# Patient Record
Sex: Female | Born: 1955 | Race: Black or African American | Hispanic: No | Smoking: Current every day smoker
Health system: Southern US, Community
[De-identification: ages and names within clinical notes are randomized; demographics above are authoritative.]

## PROBLEM LIST (undated history)

## (undated) DIAGNOSIS — F419 Anxiety disorder, unspecified: Secondary | ICD-10-CM

## (undated) DIAGNOSIS — Z87442 Personal history of urinary calculi: Secondary | ICD-10-CM

## (undated) DIAGNOSIS — Z9289 Personal history of other medical treatment: Secondary | ICD-10-CM

## (undated) DIAGNOSIS — M87059 Idiopathic aseptic necrosis of unspecified femur: Secondary | ICD-10-CM

## (undated) DIAGNOSIS — F32A Depression, unspecified: Secondary | ICD-10-CM

## (undated) DIAGNOSIS — G479 Sleep disorder, unspecified: Secondary | ICD-10-CM

## (undated) DIAGNOSIS — D649 Anemia, unspecified: Secondary | ICD-10-CM

## (undated) DIAGNOSIS — K219 Gastro-esophageal reflux disease without esophagitis: Secondary | ICD-10-CM

## (undated) DIAGNOSIS — B20 Human immunodeficiency virus [HIV] disease: Secondary | ICD-10-CM

## (undated) DIAGNOSIS — J189 Pneumonia, unspecified organism: Secondary | ICD-10-CM

## (undated) DIAGNOSIS — N200 Calculus of kidney: Secondary | ICD-10-CM

## (undated) DIAGNOSIS — C801 Malignant (primary) neoplasm, unspecified: Secondary | ICD-10-CM

## (undated) DIAGNOSIS — F329 Major depressive disorder, single episode, unspecified: Secondary | ICD-10-CM

## (undated) DIAGNOSIS — M1990 Unspecified osteoarthritis, unspecified site: Secondary | ICD-10-CM

## (undated) HISTORY — PX: APPENDECTOMY: SHX54

## (undated) HISTORY — PX: PORT-A-CATH REMOVAL: SHX5289

## (undated) HISTORY — PX: KNEE ARTHROSCOPY: SHX127

---

## 2006-12-24 HISTORY — PX: COLON SURGERY: SHX602

## 2011-12-25 HISTORY — PX: COLOSTOMY REVERSAL: SHX5782

## 2012-12-11 ENCOUNTER — Emergency Department (HOSPITAL_BASED_OUTPATIENT_CLINIC_OR_DEPARTMENT_OTHER)
Admission: EM | Admit: 2012-12-11 | Discharge: 2012-12-11 | Disposition: A | Discharge status: Home / Self Care | Payer: Medicare Other | Attending: Emergency Medicine | Admitting: Emergency Medicine

## 2012-12-11 ENCOUNTER — Encounter (HOSPITAL_BASED_OUTPATIENT_CLINIC_OR_DEPARTMENT_OTHER): Payer: Self-pay | Admitting: *Deleted

## 2012-12-11 DIAGNOSIS — Z79899 Other long term (current) drug therapy: Secondary | ICD-10-CM | POA: Insufficient documentation

## 2012-12-11 DIAGNOSIS — Z21 Asymptomatic human immunodeficiency virus [HIV] infection status: Secondary | ICD-10-CM | POA: Insufficient documentation

## 2012-12-11 DIAGNOSIS — M25569 Pain in unspecified knee: Secondary | ICD-10-CM | POA: Insufficient documentation

## 2012-12-11 DIAGNOSIS — Z85038 Personal history of other malignant neoplasm of large intestine: Secondary | ICD-10-CM | POA: Insufficient documentation

## 2012-12-11 DIAGNOSIS — G8929 Other chronic pain: Secondary | ICD-10-CM | POA: Insufficient documentation

## 2012-12-11 DIAGNOSIS — Z7982 Long term (current) use of aspirin: Secondary | ICD-10-CM | POA: Insufficient documentation

## 2012-12-11 HISTORY — DX: Human immunodeficiency virus (HIV) disease: B20

## 2012-12-11 HISTORY — DX: Malignant (primary) neoplasm, unspecified: C80.1

## 2012-12-11 MED ORDER — HYDROCODONE-ACETAMINOPHEN 5-325 MG PO TABS: 2.0000 | ORAL_TABLET | ORAL | Status: DC | PRN

## 2012-12-11 NOTE — ED Provider Notes (Signed)
Medical screening examination/treatment/procedure(s) were performed by non-physician practitioner and as supervising physician I was immediately available for consultation/collaboration.  Yussef Jorge, MD 12/11/12 1546 

## 2012-12-11 NOTE — ED Provider Notes (Signed)
History     CSN: [id number]  Arrival date & time 12/11/12  1223   First MD Initiated Contact with Patient 12/11/12 1302      Chief Complaint  Patient presents with  . Knee Pain    (Consider location/radiation/quality/duration/timing/severity/associated sxs/prior treatment) HPI Comments: Pt states that she has been seen by the chiropractor and her pcp and she is referred to ortho but she needs something for pain:pt states that he has already had xray and she was told that it was like a meniscal tear  Patient is a 56 y.o. female presenting with knee pain. The history is provided by the patient. No language interpreter was used.  Knee Pain This is a chronic problem. The current episode started more than 1 month ago. The problem occurs constantly. The problem has been unchanged. Pertinent negatives include no fever. The symptoms are aggravated by bending and standing.    Past Medical History  Diagnosis Date  . HIV disease   . Cancer     Past Surgical History  Procedure Date  . Colon surgery   . Appendectomy     No family history on file.  History  Substance Use Topics  . Smoking status: Not on file  . Smokeless tobacco: Not on file  . Alcohol Use:     OB History    Grav Para Term Preterm Abortions TAB SAB Ect Mult Living                  Review of Systems  Constitutional: Negative.  Negative for fever.  Respiratory: Negative.   Cardiovascular: Negative.     Allergies  Codeine; Ivp dye; and Sulfa antibiotics  Home Medications   Current Outpatient Rx  Name  Route  Sig  Dispense  Refill  . ASPIRIN 81 MG PO TABS   Oral   Take 81 mg by mouth daily.         . ISENTRESS PO   Oral   Take by mouth.         . NORVIR PO   Oral   Take by mouth.         . APTIVUS PO   Oral   Take by mouth.         Marland Kitchen HYDROCODONE-ACETAMINOPHEN 5-325 MG PO TABS   Oral   Take 2 tablets by mouth every 4 (four) hours as needed for pain.   15 tablet   0     BP  136/87  Pulse 88  Temp 98.5 F (36.9 C) (Oral)  Resp 20  SpO2 100%  Physical Exam  Nursing note and vitals reviewed. Constitutional: She is oriented to person, place, and time. She appears well-developed and well-nourished.  HENT:  Head: Normocephalic and atraumatic.  Cardiovascular: Normal rate and regular rhythm.   Pulmonary/Chest: Effort normal and breath sounds normal.  Musculoskeletal: Normal range of motion.       Mild swelling noted to the left knee Pt has full rom:no redness or swelling noted to the area  Neurological: She is alert and oriented to person, place, and time.  Skin: Skin is warm and dry.    ED Course  Procedures (including critical care time)  Labs Reviewed - No data to display No results found.   1. Knee pain       MDM  Pt has appt with ortho Jan 24:treating symptomatically with something for pain        Teressa Lower, NP 12/11/12 1341
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
## 2012-12-30 ENCOUNTER — Other Ambulatory Visit: Payer: Self-pay | Admitting: Family Medicine

## 2012-12-30 DIAGNOSIS — R52 Pain, unspecified: Secondary | ICD-10-CM

## 2013-01-05 ENCOUNTER — Other Ambulatory Visit: Payer: Self-pay | Admitting: Family Medicine

## 2013-01-05 DIAGNOSIS — M25569 Pain in unspecified knee: Secondary | ICD-10-CM

## 2013-01-06 ENCOUNTER — Other Ambulatory Visit: Payer: Self-pay

## 2013-01-06 ENCOUNTER — Other Ambulatory Visit: Payer: Medicare Other

## 2013-01-09 ENCOUNTER — Other Ambulatory Visit (HOSPITAL_BASED_OUTPATIENT_CLINIC_OR_DEPARTMENT_OTHER): Payer: Self-pay | Admitting: Family Medicine

## 2013-01-09 DIAGNOSIS — M25562 Pain in left knee: Secondary | ICD-10-CM

## 2013-01-11 ENCOUNTER — Encounter (HOSPITAL_BASED_OUTPATIENT_CLINIC_OR_DEPARTMENT_OTHER): Payer: Self-pay

## 2013-01-11 ENCOUNTER — Emergency Department (HOSPITAL_BASED_OUTPATIENT_CLINIC_OR_DEPARTMENT_OTHER)
Admission: EM | Admit: 2013-01-11 | Discharge: 2013-01-11 | Disposition: A | Discharge status: Home / Self Care | Payer: Medicare Other | Attending: Emergency Medicine | Admitting: Emergency Medicine

## 2013-01-11 DIAGNOSIS — M25569 Pain in unspecified knee: Secondary | ICD-10-CM | POA: Insufficient documentation

## 2013-01-11 DIAGNOSIS — F17200 Nicotine dependence, unspecified, uncomplicated: Secondary | ICD-10-CM | POA: Insufficient documentation

## 2013-01-11 DIAGNOSIS — Z79899 Other long term (current) drug therapy: Secondary | ICD-10-CM | POA: Insufficient documentation

## 2013-01-11 DIAGNOSIS — Z21 Asymptomatic human immunodeficiency virus [HIV] infection status: Secondary | ICD-10-CM | POA: Insufficient documentation

## 2013-01-11 DIAGNOSIS — Z7982 Long term (current) use of aspirin: Secondary | ICD-10-CM | POA: Insufficient documentation

## 2013-01-11 DIAGNOSIS — Z8589 Personal history of malignant neoplasm of other organs and systems: Secondary | ICD-10-CM | POA: Insufficient documentation

## 2013-01-11 DIAGNOSIS — G8929 Other chronic pain: Secondary | ICD-10-CM

## 2013-01-11 MED ORDER — HYDROCODONE-ACETAMINOPHEN 5-325 MG PO TABS: 2.0000 | ORAL_TABLET | Freq: Four times a day (QID) | ORAL | Status: DC | PRN

## 2013-01-11 NOTE — ED Notes (Signed)
MD with pt  
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
## 2013-01-11 NOTE — ED Notes (Signed)
I applied extra-large knee sleeve which improved patient comfort per Dr. approval.

## 2013-01-13 ENCOUNTER — Ambulatory Visit (HOSPITAL_BASED_OUTPATIENT_CLINIC_OR_DEPARTMENT_OTHER): Payer: Medicare Other

## 2013-01-13 ENCOUNTER — Ambulatory Visit (HOSPITAL_BASED_OUTPATIENT_CLINIC_OR_DEPARTMENT_OTHER)
Admission: RE | Admit: 2013-01-13 | Discharge: 2013-01-13 | Disposition: A | Discharge status: Home / Self Care | Payer: Medicare Other | Source: Ambulatory Visit | Attending: Family Medicine | Admitting: Family Medicine

## 2013-01-13 DIAGNOSIS — M25469 Effusion, unspecified knee: Secondary | ICD-10-CM | POA: Insufficient documentation

## 2013-01-13 DIAGNOSIS — M25562 Pain in left knee: Secondary | ICD-10-CM

## 2013-01-13 DIAGNOSIS — M2240 Chondromalacia patellae, unspecified knee: Secondary | ICD-10-CM | POA: Insufficient documentation

## 2013-06-12 ENCOUNTER — Ambulatory Visit: Payer: Medicare Other | Admitting: Physical Therapy

## 2013-06-15 ENCOUNTER — Emergency Department (HOSPITAL_BASED_OUTPATIENT_CLINIC_OR_DEPARTMENT_OTHER)
Admission: EM | Admit: 2013-06-15 | Discharge: 2013-06-15 | Disposition: A | Discharge status: Home / Self Care | Payer: Medicare Other | Attending: Emergency Medicine | Admitting: Emergency Medicine

## 2013-06-15 ENCOUNTER — Ambulatory Visit: Payer: Medicare Other | Attending: Orthopaedic Surgery | Admitting: Physical Therapy

## 2013-06-15 ENCOUNTER — Encounter (HOSPITAL_BASED_OUTPATIENT_CLINIC_OR_DEPARTMENT_OTHER): Payer: Self-pay

## 2013-06-15 DIAGNOSIS — Z87828 Personal history of other (healed) physical injury and trauma: Secondary | ICD-10-CM | POA: Insufficient documentation

## 2013-06-15 DIAGNOSIS — M6281 Muscle weakness (generalized): Secondary | ICD-10-CM | POA: Insufficient documentation

## 2013-06-15 DIAGNOSIS — Z7982 Long term (current) use of aspirin: Secondary | ICD-10-CM | POA: Insufficient documentation

## 2013-06-15 DIAGNOSIS — M25669 Stiffness of unspecified knee, not elsewhere classified: Secondary | ICD-10-CM | POA: Insufficient documentation

## 2013-06-15 DIAGNOSIS — Z79899 Other long term (current) drug therapy: Secondary | ICD-10-CM | POA: Insufficient documentation

## 2013-06-15 DIAGNOSIS — IMO0001 Reserved for inherently not codable concepts without codable children: Secondary | ICD-10-CM | POA: Insufficient documentation

## 2013-06-15 DIAGNOSIS — Z859 Personal history of malignant neoplasm, unspecified: Secondary | ICD-10-CM | POA: Insufficient documentation

## 2013-06-15 DIAGNOSIS — Z21 Asymptomatic human immunodeficiency virus [HIV] infection status: Secondary | ICD-10-CM | POA: Insufficient documentation

## 2013-06-15 DIAGNOSIS — M25569 Pain in unspecified knee: Secondary | ICD-10-CM | POA: Insufficient documentation

## 2013-06-15 DIAGNOSIS — Z9889 Other specified postprocedural states: Secondary | ICD-10-CM | POA: Insufficient documentation

## 2013-06-15 DIAGNOSIS — M25562 Pain in left knee: Secondary | ICD-10-CM

## 2013-06-15 DIAGNOSIS — R269 Unspecified abnormalities of gait and mobility: Secondary | ICD-10-CM | POA: Insufficient documentation

## 2013-06-15 DIAGNOSIS — F17200 Nicotine dependence, unspecified, uncomplicated: Secondary | ICD-10-CM | POA: Insufficient documentation

## 2013-06-15 MED ORDER — PROMETHAZINE HCL 25 MG PO TABS: 25.0000 mg | ORAL_TABLET | Freq: Four times a day (QID) | ORAL | Status: DC | PRN

## 2013-06-15 MED ORDER — HYDROCODONE-ACETAMINOPHEN 5-325 MG PO TABS: 2.0000 | ORAL_TABLET | Freq: Four times a day (QID) | ORAL | Status: DC | PRN

## 2013-06-15 MED ORDER — HYDROCODONE-ACETAMINOPHEN 5-325 MG PO TABS
2.0000 | ORAL_TABLET | Freq: Once | ORAL | Status: AC
  Administered 2013-06-15: 2 via ORAL
  Filled 2013-06-15: qty 2

## 2013-06-15 MED ORDER — DIPHENHYDRAMINE HCL 25 MG PO TABS: 50.0000 mg | ORAL_TABLET | ORAL | Status: DC | PRN

## 2013-06-15 MED ORDER — DIPHENHYDRAMINE HCL 25 MG PO CAPS
25.0000 mg | ORAL_CAPSULE | Freq: Once | ORAL | Status: AC
  Administered 2013-06-15: 25 mg via ORAL
  Filled 2013-06-15: qty 1

## 2013-06-15 MED ORDER — ONDANSETRON 8 MG PO TBDP
8.0000 mg | ORAL_TABLET | Freq: Once | ORAL | Status: AC
  Administered 2013-06-15: 8 mg via ORAL
  Filled 2013-06-15: qty 1
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
## 2013-06-16 NOTE — ED Provider Notes (Signed)
I have reviewed the report and personally reviewed the above radiology studies.    Addasyn Mcbreen S Grill, MD 06/16/13 0845 

## 2013-06-18 ENCOUNTER — Ambulatory Visit: Payer: Medicare Other | Admitting: Physical Therapy

## 2013-06-22 ENCOUNTER — Ambulatory Visit: Payer: Medicare Other | Admitting: Rehabilitation

## 2013-06-23 ENCOUNTER — Ambulatory Visit: Payer: Medicare Other | Attending: Orthopaedic Surgery | Admitting: Physical Therapy

## 2013-06-23 DIAGNOSIS — M25569 Pain in unspecified knee: Secondary | ICD-10-CM | POA: Insufficient documentation

## 2013-06-23 DIAGNOSIS — R269 Unspecified abnormalities of gait and mobility: Secondary | ICD-10-CM | POA: Insufficient documentation

## 2013-06-23 DIAGNOSIS — M25669 Stiffness of unspecified knee, not elsewhere classified: Secondary | ICD-10-CM | POA: Insufficient documentation

## 2013-06-23 DIAGNOSIS — IMO0001 Reserved for inherently not codable concepts without codable children: Secondary | ICD-10-CM | POA: Insufficient documentation

## 2013-06-23 DIAGNOSIS — M6281 Muscle weakness (generalized): Secondary | ICD-10-CM | POA: Insufficient documentation

## 2013-06-24 ENCOUNTER — Ambulatory Visit: Payer: Medicare Other | Admitting: Rehabilitation

## 2013-06-29 ENCOUNTER — Ambulatory Visit: Payer: Medicare Other | Admitting: Rehabilitation

## 2013-07-02 ENCOUNTER — Ambulatory Visit: Payer: Medicare Other | Admitting: Rehabilitation

## 2013-07-03 ENCOUNTER — Ambulatory Visit: Payer: Medicare Other | Admitting: Rehabilitation

## 2013-07-06 ENCOUNTER — Ambulatory Visit: Payer: Medicare Other | Admitting: Rehabilitation

## 2013-07-09 ENCOUNTER — Ambulatory Visit: Payer: Medicare Other | Admitting: Physical Therapy

## 2013-07-13 ENCOUNTER — Ambulatory Visit: Payer: Medicare Other | Admitting: Rehabilitation

## 2013-10-26 ENCOUNTER — Emergency Department (HOSPITAL_BASED_OUTPATIENT_CLINIC_OR_DEPARTMENT_OTHER)
Admission: EM | Admit: 2013-10-26 | Discharge: 2013-10-27 | Disposition: A | Discharge status: Home / Self Care | Payer: Medicare Other | Attending: Emergency Medicine | Admitting: Emergency Medicine

## 2013-10-26 ENCOUNTER — Encounter (HOSPITAL_BASED_OUTPATIENT_CLINIC_OR_DEPARTMENT_OTHER): Payer: Self-pay | Admitting: Emergency Medicine

## 2013-10-26 DIAGNOSIS — Z9889 Other specified postprocedural states: Secondary | ICD-10-CM | POA: Insufficient documentation

## 2013-10-26 DIAGNOSIS — N289 Disorder of kidney and ureter, unspecified: Secondary | ICD-10-CM

## 2013-10-26 DIAGNOSIS — Z9089 Acquired absence of other organs: Secondary | ICD-10-CM | POA: Insufficient documentation

## 2013-10-26 DIAGNOSIS — Z7982 Long term (current) use of aspirin: Secondary | ICD-10-CM | POA: Insufficient documentation

## 2013-10-26 DIAGNOSIS — N390 Urinary tract infection, site not specified: Secondary | ICD-10-CM | POA: Insufficient documentation

## 2013-10-26 DIAGNOSIS — F17200 Nicotine dependence, unspecified, uncomplicated: Secondary | ICD-10-CM | POA: Insufficient documentation

## 2013-10-26 DIAGNOSIS — Z21 Asymptomatic human immunodeficiency virus [HIV] infection status: Secondary | ICD-10-CM | POA: Insufficient documentation

## 2013-10-26 DIAGNOSIS — Z85038 Personal history of other malignant neoplasm of large intestine: Secondary | ICD-10-CM | POA: Insufficient documentation

## 2013-10-26 DIAGNOSIS — Z79899 Other long term (current) drug therapy: Secondary | ICD-10-CM | POA: Insufficient documentation

## 2013-10-26 LAB — CBC WITH DIFFERENTIAL/PLATELET
Basophils Absolute: 0 10*3/uL (ref 0.0–0.1)
Basophils Relative: 0 % (ref 0–1)
Eosinophils Absolute: 0.1 10*3/uL (ref 0.0–0.7)
Eosinophils Relative: 2 % (ref 0–5)
HCT: 38.7 % (ref 36.0–46.0)
MCHC: 34.6 g/dL (ref 30.0–36.0)
MCV: 92.1 fL (ref 78.0–100.0)
Monocytes Absolute: 0.6 10*3/uL (ref 0.1–1.0)
Platelets: 151 10*3/uL (ref 150–400)
RDW: 13.1 % (ref 11.5–15.5)

## 2013-10-26 LAB — COMPREHENSIVE METABOLIC PANEL
AST: 18 U/L (ref 0–37)
Albumin: 3.7 g/dL (ref 3.5–5.2)
Calcium: 10.1 mg/dL (ref 8.4–10.5)
Creatinine, Ser: 1.6 mg/dL — ABNORMAL HIGH (ref 0.50–1.10)
GFR calc non Af Amer: 35 mL/min — ABNORMAL LOW (ref >90)
Sodium: 142 mEq/L (ref 135–145)
Total Protein: 7.9 g/dL (ref 6.0–8.3)

## 2013-10-26 LAB — URINALYSIS, ROUTINE W REFLEX MICROSCOPIC
Glucose, UA: NEGATIVE mg/dL
Hgb urine dipstick: NEGATIVE
Ketones, ur: NEGATIVE mg/dL
Protein, ur: NEGATIVE mg/dL
Urobilinogen, UA: 1 mg/dL (ref 0.0–1.0)

## 2013-10-26 LAB — URINE MICROSCOPIC-ADD ON

## 2013-10-26 MED ORDER — SODIUM CHLORIDE 0.9 % IV BOLUS (SEPSIS)
1000.0000 mL | Freq: Once | INTRAVENOUS | Status: AC
  Administered 2013-10-26: 1000 mL via INTRAVENOUS

## 2013-10-26 MED ORDER — DEXTROSE 5 % IV SOLN
1.0000 g | Freq: Once | INTRAVENOUS | Status: AC
  Administered 2013-10-26: 1 g via INTRAVENOUS

## 2013-10-26 MED ORDER — ONDANSETRON HCL 4 MG/2ML IJ SOLN
4.0000 mg | Freq: Once | INTRAMUSCULAR | Status: AC
  Administered 2013-10-26: 4 mg via INTRAVENOUS
  Filled 2013-10-26: qty 2

## 2013-10-26 MED ORDER — CEFTRIAXONE SODIUM 1 G IJ SOLR
INTRAMUSCULAR | Status: AC
  Filled 2013-10-26: qty 10

## 2013-10-26 MED ORDER — MORPHINE SULFATE 4 MG/ML IJ SOLN
4.0000 mg | Freq: Once | INTRAMUSCULAR | Status: AC
  Administered 2013-10-26: 4 mg via INTRAVENOUS
  Filled 2013-10-26: qty 1

## 2013-10-26 NOTE — ED Notes (Signed)
Pt ambulatory to b/r with emesis bag in hand, steady gait. Alert, NAD, calm.

## 2013-10-26 NOTE — ED Provider Notes (Signed)
CSN: [id number]     Arrival date & time 10/26/13  2206 History   First MD Initiated Contact with Patient 10/26/13 2229     Chief Complaint  Patient presents with  . Abdominal Pain   (Consider location/radiation/quality/duration/timing/severity/associated sxs/prior Treatment) The history is provided by the patient.   Patient with hx HIV (CD4 count in 300s, viral load undetectable), and colon cancer (7 years in remission) p/w 3 days of abdominal pain, nausea, anorexia, urinary frequency and urgency.  Pain is located in her upper abdomen, is crampy, comes and goes.  Denies fevers, chills, body aches, diarrhea, constipation, bloody stool, abnormal vaginal discharge or bleeding.  Pt is postmenopausal, no menstrual period in 7 years.   Past Medical History  Diagnosis Date  . HIV disease   . Cancer    Past Surgical History  Procedure Laterality Date  . Colon surgery    . Appendectomy     History reviewed. No pertinent family history. History  Substance Use Topics  . Smoking status: Current Every Day Smoker -- 0.50 packs/day    Types: Cigarettes  . Smokeless tobacco: Not on file  . Alcohol Use: No   OB History   Grav Para Term Preterm Abortions TAB SAB Ect Mult Living                 Review of Systems  Constitutional: Positive for appetite change. Negative for fever.  Gastrointestinal: Positive for nausea and abdominal pain. Negative for vomiting, diarrhea, constipation and blood in stool.  Genitourinary: Positive for urgency and frequency. Negative for dysuria, vaginal bleeding, vaginal discharge and menstrual problem.    Allergies  Codeine; Ivp dye; and Sulfa antibiotics  Home Medications   Current Outpatient Rx  Name  Route  Sig  Dispense  Refill  . aspirin 81 MG tablet   Oral   Take 81 mg by mouth daily.         . diphenhydrAMINE (BENADRYL) 25 MG tablet   Oral   Take 2 tablets (50 mg total) by mouth every 4 (four) hours as needed for itching.   20 tablet   0   .  Dolutegravir Sodium (TIVICAY PO)   Oral   Take by mouth.         Marland Kitchen emtricitabine-tenofovir (TRUVADA) 200-300 MG per tablet   Oral   Take 1 tablet by mouth daily.         Marland Kitchen HYDROcodone-acetaminophen (NORCO/VICODIN) 5-325 MG per tablet   Oral   Take 2 tablets by mouth every 6 (six) hours as needed for pain.   20 tablet   0   . HYDROcodone-acetaminophen (NORCO/VICODIN) 5-325 MG per tablet   Oral   Take 2 tablets by mouth every 6 (six) hours as needed for pain.   12 tablet   0   . promethazine (PHENERGAN) 25 MG tablet   Oral   Take 1 tablet (25 mg total) by mouth every 6 (six) hours as needed for nausea.   12 tablet   0    BP 140/88  Pulse 86  Temp(Src) 98.4 F (36.9 C) (Oral)  Resp 16  Ht 5\' 10"  (1.778 m)  Wt 202 lb (91.627 kg)  BMI 28.98 kg/m2  SpO2 100% Physical Exam  Nursing note and vitals reviewed. Constitutional: She appears well-developed and well-nourished. No distress.  HENT:  Head: Normocephalic and atraumatic.  Neck: Neck supple.  Pulmonary/Chest: Effort normal.  Abdominal: Soft. She exhibits no distension. There is generalized tenderness. There is no rebound  and no guarding.  Neurological: She is alert.  Skin: She is not diaphoretic.    ED Course  Procedures (including critical care time) Labs Review Labs Reviewed  URINALYSIS, ROUTINE W REFLEX MICROSCOPIC - Abnormal; Notable for the following:    APPearance CLOUDY (*)    Leukocytes, UA LARGE (*)    All other components within normal limits  COMPREHENSIVE METABOLIC PANEL - Abnormal; Notable for the following:    Potassium 3.4 (*)    Glucose, Bld 107 (*)    Creatinine, Ser 1.60 (*)    Alkaline Phosphatase 164 (*)    GFR calc non Af Amer 35 (*)    GFR calc Af Amer 40 (*)    All other components within normal limits  URINE MICROSCOPIC-ADD ON - Abnormal; Notable for the following:    Squamous Epithelial / LPF FEW (*)    Bacteria, UA FEW (*)    All other components within normal limits  URINE  CULTURE  CBC WITH DIFFERENTIAL  LIPASE, BLOOD   Imaging Review No results found.  EKG Interpretation   None      12:50 AM Reexamination of abdomen is unchanged.   MDM   1. UTI (lower urinary tract infection)   2. Renal insufficiency    Pt with abdominal pain, urinary frequency/urgency, nausea x 3 days.  Found to have UTI.  Labs otherwise remarkable only for renal insufficiency, mildly elevated alk phos.  There are no old labs for comparison.  Pt has PCP and is able to follow up closely.  Pt feeling better with pain medications here.  No vomiting.  Afebrile. WBC normal. Abdominal exam is nonsurgical.  D/C home with keflex and percocet. PCP follow up.  Discussed result, findings, treatment, and follow up  with patient.  Pt given return precautions.  Pt verbalizes understanding and agrees with plan.      I doubt any other EMC precluding discharge at this time including, but not necessarily limited to the following: pyelonephritis    Trixie Dredge, PA-C 10/27/13 0110

## 2013-10-26 NOTE — ED Notes (Signed)
Pt c/o abd pain with nausea x 3 days also c/o urinary freq

## 2013-10-26 NOTE — ED Notes (Signed)
Last ate 1600, last BM yesterday (normal), h/o appendectomy and colon CA surgery, c/o abd pain and burning, onset Saturday, also nausea, admits to some frequency & urgency (denies: HA, fever, vd, bleeding, dark stools, light headedness, dizziness, back pain, dysuria, sob, hematuria or vaginal sx). No meds PTA.

## 2013-10-27 MED ORDER — MORPHINE SULFATE 4 MG/ML IJ SOLN
4.0000 mg | Freq: Once | INTRAMUSCULAR | Status: AC
  Administered 2013-10-27: 4 mg via INTRAVENOUS
  Filled 2013-10-27: qty 1

## 2013-10-27 MED ORDER — CEPHALEXIN 500 MG PO CAPS: 500.0000 mg | ORAL_CAPSULE | Freq: Four times a day (QID) | ORAL | Status: DC

## 2013-10-27 MED ORDER — OXYCODONE-ACETAMINOPHEN 5-325 MG PO TABS: 1.0000 | ORAL_TABLET | ORAL | Status: DC | PRN

## 2013-10-27 NOTE — ED Notes (Signed)
"  feels better", denies nausea. Family x2 at [facility].

## 2013-10-27 NOTE — ED Provider Notes (Signed)
  Medical screening examination/treatment/procedure(s) were performed by non-physician practitioner and as supervising physician I was immediately available for consultation/collaboration.      Gerhard Munch, MD 10/27/13 [phone]

## 2013-10-28 LAB — URINE CULTURE: Colony Count: NO GROWTH

## 2013-11-28 ENCOUNTER — Encounter (HOSPITAL_BASED_OUTPATIENT_CLINIC_OR_DEPARTMENT_OTHER): Payer: Self-pay | Admitting: Emergency Medicine

## 2013-11-28 ENCOUNTER — Emergency Department (HOSPITAL_BASED_OUTPATIENT_CLINIC_OR_DEPARTMENT_OTHER)
Admission: EM | Admit: 2013-11-28 | Discharge: 2013-11-28 | Disposition: A | Discharge status: Home / Self Care | Payer: Medicare Other | Attending: Emergency Medicine | Admitting: Emergency Medicine

## 2013-11-28 ENCOUNTER — Emergency Department (HOSPITAL_BASED_OUTPATIENT_CLINIC_OR_DEPARTMENT_OTHER): Payer: Medicare Other

## 2013-11-28 DIAGNOSIS — IMO0002 Reserved for concepts with insufficient information to code with codable children: Secondary | ICD-10-CM | POA: Insufficient documentation

## 2013-11-28 DIAGNOSIS — Z21 Asymptomatic human immunodeficiency virus [HIV] infection status: Secondary | ICD-10-CM | POA: Insufficient documentation

## 2013-11-28 DIAGNOSIS — R079 Chest pain, unspecified: Secondary | ICD-10-CM | POA: Insufficient documentation

## 2013-11-28 DIAGNOSIS — F17200 Nicotine dependence, unspecified, uncomplicated: Secondary | ICD-10-CM | POA: Insufficient documentation

## 2013-11-28 DIAGNOSIS — Z859 Personal history of malignant neoplasm, unspecified: Secondary | ICD-10-CM | POA: Insufficient documentation

## 2013-11-28 DIAGNOSIS — J209 Acute bronchitis, unspecified: Secondary | ICD-10-CM | POA: Insufficient documentation

## 2013-11-28 DIAGNOSIS — J40 Bronchitis, not specified as acute or chronic: Secondary | ICD-10-CM

## 2013-11-28 DIAGNOSIS — M549 Dorsalgia, unspecified: Secondary | ICD-10-CM | POA: Insufficient documentation

## 2013-11-28 DIAGNOSIS — R5381 Other malaise: Secondary | ICD-10-CM | POA: Insufficient documentation

## 2013-11-28 DIAGNOSIS — Z79899 Other long term (current) drug therapy: Secondary | ICD-10-CM | POA: Insufficient documentation

## 2013-11-28 DIAGNOSIS — Z792 Long term (current) use of antibiotics: Secondary | ICD-10-CM | POA: Insufficient documentation

## 2013-11-28 DIAGNOSIS — Z7982 Long term (current) use of aspirin: Secondary | ICD-10-CM | POA: Insufficient documentation

## 2013-11-28 MED ORDER — DOXYCYCLINE HYCLATE 100 MG PO CAPS: 100.0000 mg | ORAL_CAPSULE | Freq: Two times a day (BID) | ORAL | Status: DC

## 2013-11-28 MED ORDER — ALBUTEROL SULFATE (5 MG/ML) 0.5% IN NEBU
2.5000 mg | INHALATION_SOLUTION | RESPIRATORY_TRACT | Status: DC
  Administered 2013-11-28: 2.5 mg via RESPIRATORY_TRACT
  Filled 2013-11-28: qty 0.5

## 2013-11-28 MED ORDER — PREDNISONE 50 MG PO TABS
60.0000 mg | ORAL_TABLET | Freq: Once | ORAL | Status: AC
  Administered 2013-11-28: 60 mg via ORAL
  Filled 2013-11-28 (×2): qty 1

## 2013-11-28 MED ORDER — IPRATROPIUM BROMIDE 0.02 % IN SOLN
0.5000 mg | RESPIRATORY_TRACT | Status: DC
  Administered 2013-11-28: 0.5 mg via RESPIRATORY_TRACT
  Filled 2013-11-28: qty 2.5

## 2013-11-28 MED ORDER — ALBUTEROL SULFATE (2.5 MG/3ML) 0.083% IN NEBU: 2.5000 mg | INHALATION_SOLUTION | RESPIRATORY_TRACT | Status: DC | PRN

## 2013-11-28 MED ORDER — OXYCODONE-ACETAMINOPHEN 5-325 MG PO TABS
2.0000 | ORAL_TABLET | Freq: Once | ORAL | Status: AC
  Administered 2013-11-28: 2 via ORAL
  Filled 2013-11-28: qty 2

## 2013-11-28 MED ORDER — PREDNISONE 10 MG PO TABS: 20.0000 mg | ORAL_TABLET | Freq: Every day | ORAL | Status: DC

## 2013-11-28 MED ORDER — OXYCODONE-ACETAMINOPHEN 5-325 MG PO TABS: 2.0000 | ORAL_TABLET | ORAL | Status: DC | PRN

## 2013-11-28 NOTE — ED Notes (Signed)
Patient here with dry cough and bilateral earache x 1 day, used mucinex without relief. Reports that her cough was worse last night during sleep. No distress.

## 2013-11-28 NOTE — ED Provider Notes (Signed)
CSN: [id number]     Arrival date & time 11/28/13  0940 History   First MD Initiated Contact with Patient 11/28/13 [phone]     Chief Complaint  Patient presents with  . Cough   (Consider location/radiation/quality/duration/timing/severity/associated sxs/prior Treatment) HPI Comments: Patient presents with a two-day history of cough and wheezing. She states the cough started yesterday but got worse through the night. She's been feeling wheezy and short of breath. She has a nebulizer machine at home and it was her husband she doesn't have any solution in the machine. She complains of soreness across her chest and her back from the coughing. She denies any leg pain or swelling. She denies any known fevers. Her cough is mostly dry and nonproductive. She's had a little bit of nasal drainage. She denies any past history of asthma or lung disease but she is a smoker. She also has a history of HIV and is on an antiviral regimen.  Patient is a 57 y.o. female presenting with cough.  Cough Associated symptoms: chest pain, rhinorrhea, shortness of breath and wheezing   Associated symptoms: no chills, no diaphoresis, no fever, no headaches and no rash     Past Medical History  Diagnosis Date  . HIV disease   . Cancer    Past Surgical History  Procedure Laterality Date  . Colon surgery    . Appendectomy     No family history on file. History  Substance Use Topics  . Smoking status: Current Every Day Smoker -- 0.50 packs/day    Types: Cigarettes  . Smokeless tobacco: Not on file  . Alcohol Use: No   OB History   Grav Para Term Preterm Abortions TAB SAB Ect Mult Living                 Review of Systems  Constitutional: Positive for fatigue. Negative for fever, chills and diaphoresis.  HENT: Positive for congestion and rhinorrhea. Negative for sneezing.   Eyes: Negative.   Respiratory: Positive for cough, shortness of breath and wheezing. Negative for chest tightness.   Cardiovascular:  Positive for chest pain. Negative for leg swelling.  Gastrointestinal: Negative for nausea, vomiting, abdominal pain, diarrhea and blood in stool.  Genitourinary: Negative for frequency, hematuria, flank pain and difficulty urinating.  Musculoskeletal: Positive for back pain. Negative for arthralgias.  Skin: Negative for rash.  Neurological: Negative for dizziness, speech difficulty, weakness, numbness and headaches.    Allergies  Codeine; Ivp dye; and Sulfa antibiotics  Home Medications   Current Outpatient Rx  Name  Route  Sig  Dispense  Refill  . albuterol (PROVENTIL) (2.5 MG/3ML) 0.083% nebulizer solution   Nebulization   Take 3 mLs (2.5 mg total) by nebulization every 4 (four) hours as needed for wheezing or shortness of breath.   30 vial   0   . aspirin 81 MG tablet   Oral   Take 81 mg by mouth daily.         Marland Kitchen Dolutegravir Sodium (TIVICAY PO)   Oral   Take by mouth.         . doxycycline (VIBRAMYCIN) 100 MG capsule   Oral   Take 1 capsule (100 mg total) by mouth 2 (two) times daily. One po bid x 7 days   14 capsule   0   . emtricitabine-tenofovir (TRUVADA) 200-300 MG per tablet   Oral   Take 1 tablet by mouth daily.         Marland Kitchen oxyCODONE-acetaminophen (PERCOCET) 5-325  MG per tablet   Oral   Take 2 tablets by mouth every 4 (four) hours as needed.   15 tablet   0   . predniSONE (DELTASONE) 10 MG tablet   Oral   Take 2 tablets (20 mg total) by mouth daily.   10 tablet   0   . promethazine (PHENERGAN) 25 MG tablet   Oral   Take 1 tablet (25 mg total) by mouth every 6 (six) hours as needed for nausea.   12 tablet   0    BP 132/68  Pulse 97  Temp(Src) 98.4 F (36.9 C)  Resp 18  SpO2 100% Physical Exam  Constitutional: She is oriented to person, place, and time. She appears well-developed and well-nourished.  HENT:  Head: Normocephalic and atraumatic.  Right Ear: External ear normal.  Left Ear: External ear normal.  Eyes: Pupils are equal,  round, and reactive to light.  Neck: Normal range of motion. Neck supple.  Cardiovascular: Normal rate, regular rhythm and normal heart sounds.   Pulmonary/Chest: Effort normal. No respiratory distress. She has wheezes. She has no rales. She exhibits no tenderness.  Positive wheezing rhonchi and diminished breath sounds bilaterally. There is no increased work of breathing  Abdominal: Soft. Bowel sounds are normal. There is no tenderness. There is no rebound and no guarding.  Musculoskeletal: Normal range of motion. She exhibits no edema.  No calf tenderness  Lymphadenopathy:    She has no cervical adenopathy.  Neurological: She is alert and oriented to person, place, and time.  Skin: Skin is warm and dry. No rash noted.  Psychiatric: She has a normal mood and affect.    ED Course  Procedures (including critical care time) Labs Review Labs Reviewed - No data to display Imaging Review Dg Chest 2 View  11/28/2013   CLINICAL DATA:  The upper chest pain.  Dry cough.  Ear aches.  HIV.  EXAM: CHEST  2 VIEW  COMPARISON:  None.  FINDINGS: The lungs appear clear. The cardiac and mediastinal contours normal.  No pleural effusion identified.  Thoracic spondylosis and degenerative disc disease.  IMPRESSION: 1. No acute findings.  Thoracic spondylosis.   Electronically Signed   By: Herbie Baltimore M.D.   On: 11/28/2013 10:53    EKG Interpretation   None       MDM   1. Bronchitis    Patient received a nebulizer treatment here as well as a dose of steroid. She is maintaining normal oxygen saturations. She's feeling much better. She states her breathing feels better. Her lung exam has improved. She states that she regularly takes her antiviral medications and that her last viral load was undetectable. She has an appointment with her infectious disease physician on Monday for a checkup. She was discharged with a prescription for doxycycline for bronchitis as well as prednisone and albuterol solution  to use in her nebulizer machine.    Rolan Bucco, MD 11/28/13 [phone]

## 2014-02-10 ENCOUNTER — Encounter (HOSPITAL_BASED_OUTPATIENT_CLINIC_OR_DEPARTMENT_OTHER): Payer: Self-pay | Admitting: Emergency Medicine

## 2014-02-10 ENCOUNTER — Emergency Department (HOSPITAL_BASED_OUTPATIENT_CLINIC_OR_DEPARTMENT_OTHER)
Admission: EM | Admit: 2014-02-10 | Discharge: 2014-02-11 | Disposition: A | Discharge status: Home / Self Care | Payer: Medicare Other | Attending: Emergency Medicine | Admitting: Emergency Medicine

## 2014-02-10 DIAGNOSIS — R141 Gas pain: Secondary | ICD-10-CM | POA: Insufficient documentation

## 2014-02-10 DIAGNOSIS — K2990 Gastroduodenitis, unspecified, without bleeding: Principal | ICD-10-CM

## 2014-02-10 DIAGNOSIS — Z79899 Other long term (current) drug therapy: Secondary | ICD-10-CM | POA: Insufficient documentation

## 2014-02-10 DIAGNOSIS — K2970 Gastritis, unspecified, without bleeding: Secondary | ICD-10-CM

## 2014-02-10 DIAGNOSIS — R143 Flatulence: Secondary | ICD-10-CM

## 2014-02-10 DIAGNOSIS — Z21 Asymptomatic human immunodeficiency virus [HIV] infection status: Secondary | ICD-10-CM | POA: Insufficient documentation

## 2014-02-10 DIAGNOSIS — N390 Urinary tract infection, site not specified: Secondary | ICD-10-CM | POA: Insufficient documentation

## 2014-02-10 DIAGNOSIS — R142 Eructation: Secondary | ICD-10-CM | POA: Insufficient documentation

## 2014-02-10 DIAGNOSIS — IMO0002 Reserved for concepts with insufficient information to code with codable children: Secondary | ICD-10-CM | POA: Insufficient documentation

## 2014-02-10 DIAGNOSIS — F17200 Nicotine dependence, unspecified, uncomplicated: Secondary | ICD-10-CM | POA: Insufficient documentation

## 2014-02-10 DIAGNOSIS — Z7982 Long term (current) use of aspirin: Secondary | ICD-10-CM | POA: Insufficient documentation

## 2014-02-10 DIAGNOSIS — Z859 Personal history of malignant neoplasm, unspecified: Secondary | ICD-10-CM | POA: Insufficient documentation

## 2014-02-10 DIAGNOSIS — R110 Nausea: Secondary | ICD-10-CM | POA: Insufficient documentation

## 2014-02-10 MED ORDER — SUCRALFATE 1 G PO TABS
1.0000 g | ORAL_TABLET | Freq: Once | ORAL | Status: AC
  Administered 2014-02-10: 1 g via ORAL
  Filled 2014-02-10: qty 1

## 2014-02-10 MED ORDER — ONDANSETRON 4 MG PO TBDP
4.0000 mg | ORAL_TABLET | Freq: Once | ORAL | Status: AC
  Administered 2014-02-10: 4 mg via ORAL
  Filled 2014-02-10: qty 1

## 2014-02-10 NOTE — ED Provider Notes (Signed)
CSN: [id number]     Arrival date & time 02/10/14  2251 History  This chart was scribed for Wynetta Fines, MD by Elby Beck, ED Scribe. This patient was seen in room MH03/MH03 and the patient's care was started at 11:19 PM.   Chief Complaint  Patient presents with  . Abdominal Pain    The history is provided by the patient. No language interpreter was used.     HPI Comments: Ashley Shepherd is a 58 y.o. female with a history of HIV and CA who presents to the Emergency Department complaining of epigastric abdominal pain onset this morning when she awoke from sleep. She characterizes this pain as "burning". She reports associated nausea today and states that she is nauseated currently. She also states that she has been belching today and has had a sour taste in her mouth. She states that she has taken a Tums today without relief of her pain. She states that she was seen earlier tonight for these symptoms at [facility], and diagnosed with a UTI. She was treated with Cipro and Pyridium. She reports that she has a history of UTIs but that this does not feel similar due to the location of her pain- epigastric rather than suprapubic. She denies back pain currently but states that she had some briefly over the weekend. She states that her HIV is well-controlled, and that her viral loads are undetectable. She denies any other pain or symptoms.    Past Medical History  Diagnosis Date  . HIV disease   . Cancer    Past Surgical History  Procedure Laterality Date  . Colon surgery    . Appendectomy     History reviewed. No pertinent family history. History  Substance Use Topics  . Smoking status: Current Every Day Smoker -- 0.50 packs/day    Types: Cigarettes  . Smokeless tobacco: Not on file  . Alcohol Use: No   OB History   Grav Para Term Preterm Abortions TAB SAB Ect Mult Living                 Review of Systems A complete 10 system review of systems was obtained and all systems are negative  except as noted in the HPI and PMH.   Allergies  Codeine; Ivp dye; and Sulfa antibiotics  Home Medications   Current Outpatient Rx  Name  Route  Sig  Dispense  Refill  . albuterol (PROVENTIL) (2.5 MG/3ML) 0.083% nebulizer solution   Nebulization   Take 3 mLs (2.5 mg total) by nebulization every 4 (four) hours as needed for wheezing or shortness of breath.   30 vial   0   . aspirin 81 MG tablet   Oral   Take 81 mg by mouth daily.         Marland Kitchen Dolutegravir Sodium (TIVICAY PO)   Oral   Take by mouth.         . doxycycline (VIBRAMYCIN) 100 MG capsule   Oral   Take 1 capsule (100 mg total) by mouth 2 (two) times daily. One po bid x 7 days   14 capsule   0   . emtricitabine-tenofovir (TRUVADA) 200-300 MG per tablet   Oral   Take 1 tablet by mouth daily.         Marland Kitchen oxyCODONE-acetaminophen (PERCOCET) 5-325 MG per tablet   Oral   Take 2 tablets by mouth every 4 (four) hours as needed.   15 tablet   0   .  predniSONE (DELTASONE) 10 MG tablet   Oral   Take 2 tablets (20 mg total) by mouth daily.   10 tablet   0   . promethazine (PHENERGAN) 25 MG tablet   Oral   Take 1 tablet (25 mg total) by mouth every 6 (six) hours as needed for nausea.   12 tablet   0    Triage Vitals: BP 119/72  Pulse 83  Temp(Src) 98.8 F (37.1 C) (Oral)  Resp 18  Ht 5\' 11"  (1.803 m)  Wt 200 lb (90.719 kg)  BMI 27.91 kg/m2  SpO2 100%  Physical Exam  Nursing note and vitals reviewed. General: Well-developed, well-nourished female in no acute distress; appearance consistent with age of record HENT: normocephalic; atraumatic Eyes: pupils equal, round and reactive to light; extraocular muscles intact Neck: supple Heart: regular rate and rhythm; no murmurs, rubs or gallops Lungs: clear to auscultation bilaterally Abdomen: soft; nondistended; equivocal epigastric tenderness; no masses or hepatosplenomegaly; bowel sounds present Extremities: No deformity; full range of motion; pulses  normal Neurologic: Awake, alert and oriented; motor function intact in all extremities and symmetric; no facial droop Skin: Warm and dry Psychiatric: Normal mood and affect   ED Course  Procedures (including critical care time)  DIAGNOSTIC STUDIES: Oxygen Saturation is 100% on RA, normal by my interpretation.    COORDINATION OF CARE: 11:25 PM- Discussed plan to order medications. Pt advised of plan for treatment and pt agrees.   MDM  12:26 AM Significant relief in patients epigastric burning after Carafate. Will place her on Carafate. Patient advised to take to Tivicay. and omeprazole 2 hours before Carafate.  I personally performed the services described in this documentation, which was scribed in my presence.  The recorded information has been reviewed and is accurate.   Wynetta Fines, MD 02/11/14 [phone]

## 2014-02-10 NOTE — ED Notes (Signed)
Pt c/o diffuse abd " burning" all day

## 2014-02-11 MED ORDER — OMEPRAZOLE 40 MG PO CPDR: DELAYED_RELEASE_CAPSULE | ORAL | Status: DC

## 2014-02-11 MED ORDER — HYDROCODONE-ACETAMINOPHEN 5-325 MG PO TABS
1.0000 | ORAL_TABLET | Freq: Once | ORAL | Status: AC
  Administered 2014-02-11: 1 via ORAL
  Filled 2014-02-11: qty 1

## 2014-02-11 MED ORDER — ONDANSETRON 4 MG PO TBDP: 4.0000 mg | ORAL_TABLET | Freq: Three times a day (TID) | ORAL | Status: DC | PRN

## 2014-02-11 MED ORDER — SUCRALFATE 1 G PO TABS: ORAL_TABLET | ORAL | Status: DC

## 2014-02-11 MED ORDER — PANTOPRAZOLE SODIUM 40 MG PO TBEC
40.0000 mg | DELAYED_RELEASE_TABLET | Freq: Once | ORAL | Status: AC
  Administered 2014-02-11: 40 mg via ORAL
  Filled 2014-02-11: qty 1

## 2014-02-11 NOTE — Discharge Instructions (Signed)

## 2014-02-13 ENCOUNTER — Emergency Department (HOSPITAL_BASED_OUTPATIENT_CLINIC_OR_DEPARTMENT_OTHER)
Admission: EM | Admit: 2014-02-13 | Discharge: 2014-02-13 | Disposition: A | Discharge status: Home / Self Care | Payer: Medicare Other | Attending: Emergency Medicine | Admitting: Emergency Medicine

## 2014-02-13 ENCOUNTER — Encounter (HOSPITAL_BASED_OUTPATIENT_CLINIC_OR_DEPARTMENT_OTHER): Payer: Self-pay | Admitting: Emergency Medicine

## 2014-02-13 DIAGNOSIS — Z87440 Personal history of urinary (tract) infections: Secondary | ICD-10-CM | POA: Insufficient documentation

## 2014-02-13 DIAGNOSIS — Z79899 Other long term (current) drug therapy: Secondary | ICD-10-CM | POA: Insufficient documentation

## 2014-02-13 DIAGNOSIS — Z7982 Long term (current) use of aspirin: Secondary | ICD-10-CM | POA: Insufficient documentation

## 2014-02-13 DIAGNOSIS — K2990 Gastroduodenitis, unspecified, without bleeding: Principal | ICD-10-CM

## 2014-02-13 DIAGNOSIS — Z85038 Personal history of other malignant neoplasm of large intestine: Secondary | ICD-10-CM | POA: Insufficient documentation

## 2014-02-13 DIAGNOSIS — Z792 Long term (current) use of antibiotics: Secondary | ICD-10-CM | POA: Insufficient documentation

## 2014-02-13 DIAGNOSIS — K2970 Gastritis, unspecified, without bleeding: Secondary | ICD-10-CM | POA: Insufficient documentation

## 2014-02-13 DIAGNOSIS — F17200 Nicotine dependence, unspecified, uncomplicated: Secondary | ICD-10-CM | POA: Insufficient documentation

## 2014-02-13 DIAGNOSIS — IMO0002 Reserved for concepts with insufficient information to code with codable children: Secondary | ICD-10-CM | POA: Insufficient documentation

## 2014-02-13 DIAGNOSIS — Z21 Asymptomatic human immunodeficiency virus [HIV] infection status: Secondary | ICD-10-CM | POA: Insufficient documentation

## 2014-02-13 LAB — CBC WITH DIFFERENTIAL/PLATELET
Basophils Absolute: 0 10*3/uL (ref 0.0–0.1)
Basophils Relative: 0 % (ref 0–1)
Eosinophils Absolute: 0.1 10*3/uL (ref 0.0–0.7)
Eosinophils Relative: 2 % (ref 0–5)
HCT: 39 % (ref 36.0–46.0)
Hemoglobin: 13.3 g/dL (ref 12.0–15.0)
LYMPHS PCT: 43 % (ref 12–46)
Lymphs Abs: 2 10*3/uL (ref 0.7–4.0)
MCH: 31.3 pg (ref 26.0–34.0)
MCHC: 34.1 g/dL (ref 30.0–36.0)
MCV: 91.8 fL (ref 78.0–100.0)
Monocytes Absolute: 0.4 10*3/uL (ref 0.1–1.0)
Monocytes Relative: 9 % (ref 3–12)
NEUTROS PCT: 46 % (ref 43–77)
Neutro Abs: 2.1 10*3/uL (ref 1.7–7.7)
PLATELETS: 153 10*3/uL (ref 150–400)
RBC: 4.25 MIL/uL (ref 3.87–5.11)
RDW: 13.6 % (ref 11.5–15.5)
WBC: 4.6 10*3/uL (ref 4.0–10.5)

## 2014-02-13 LAB — URINALYSIS, ROUTINE W REFLEX MICROSCOPIC
BILIRUBIN URINE: NEGATIVE
GLUCOSE, UA: NEGATIVE mg/dL
Hgb urine dipstick: NEGATIVE
Ketones, ur: NEGATIVE mg/dL
Nitrite: NEGATIVE
PH: 6 (ref 5.0–8.0)
Protein, ur: NEGATIVE mg/dL
Specific Gravity, Urine: 1.014 (ref 1.005–1.030)
UROBILINOGEN UA: 0.2 mg/dL (ref 0.0–1.0)

## 2014-02-13 LAB — COMPREHENSIVE METABOLIC PANEL
ALK PHOS: 137 U/L — AB (ref 39–117)
ALT: 7 U/L (ref 0–35)
AST: 17 U/L (ref 0–37)
Albumin: 3.6 g/dL (ref 3.5–5.2)
BUN: 12 mg/dL (ref 6–23)
CALCIUM: 9.7 mg/dL (ref 8.4–10.5)
CO2: 27 meq/L (ref 19–32)
Chloride: 108 mEq/L (ref 96–112)
Creatinine, Ser: 1.4 mg/dL — ABNORMAL HIGH (ref 0.50–1.10)
GFR calc Af Amer: 47 mL/min — ABNORMAL LOW (ref >90)
GFR, EST NON AFRICAN AMERICAN: 41 mL/min — AB (ref >90)
Glucose, Bld: 101 mg/dL — ABNORMAL HIGH (ref 70–99)
POTASSIUM: 4.1 meq/L (ref 3.7–5.3)
SODIUM: 146 meq/L (ref 137–147)
Total Bilirubin: 0.3 mg/dL (ref 0.3–1.2)
Total Protein: 7.5 g/dL (ref 6.0–8.3)

## 2014-02-13 LAB — LIPASE, BLOOD: Lipase: 15 U/L (ref 11–59)

## 2014-02-13 LAB — URINE MICROSCOPIC-ADD ON

## 2014-02-13 MED ORDER — GI COCKTAIL ~~LOC~~
30.0000 mL | Freq: Once | ORAL | Status: AC
  Administered 2014-02-13: 30 mL via ORAL
  Filled 2014-02-13: qty 30

## 2014-02-13 NOTE — Discharge Instructions (Signed)

## 2014-02-13 NOTE — ED Notes (Signed)
Reports being treated for a UTI from another hospital.

## 2014-02-13 NOTE — ED Provider Notes (Signed)
CSN: [id number]     Arrival date & time 02/13/14  1417 History  This chart was scribed for Malvin Johns, MD by Zettie Pho, ED Scribe. This patient was seen in room MH12/MH12 and the patient's care was started at 3:35 PM.    Chief Complaint  Patient presents with  . Abdominal Pain   The history is provided by the patient. No language interpreter was used.   HPI Comments: Ashley Shepherd is a 58 y.o. Female with a history of colon cancer with coloostomy 4-5 years ago who presents to the Emergency Department complaining of a constant, sharp/stabbing and burning pain to the periumbilical region of the abdomen onset a few days ago. She denies any pain radiation. Patient reports associated nausea and decreased appetite. She states that she was seen at [facility] 3 days ago for similar complaints and patient was treated for a UTI. Patient states that she may have received imaging at that time, but does not remember. Patient then came here later that day and was treated for gastritis with Carafate and omeprazole, but states that she has not had any relief of her symptoms. She denies emesis, diarrhea, hematochezia, fever, cough, chest pain, shortness of breath. Patient also has a history of HIV and states that she has been compliant with her HIV medications and has an appointment to follow up with her PCP next week. Her last viral load was undetectable.  She states that she does not have a gastroenterologist, but is followed by a PCP in [place].   Past Medical History  Diagnosis Date  . HIV disease   . Cancer    Past Surgical History  Procedure Laterality Date  . Colon surgery    . Appendectomy     No family history on file. History  Substance Use Topics  . Smoking status: Current Every Day Smoker -- 0.50 packs/day    Types: Cigarettes  . Smokeless tobacco: Not on file  . Alcohol Use: No   OB History   Grav Para Term Preterm Abortions TAB SAB Ect Mult Living                  Review of Systems  Constitutional: Positive for appetite change (decreased). Negative for fever, chills, diaphoresis and fatigue.  HENT: Negative for congestion, rhinorrhea and sneezing.   Eyes: Negative.   Respiratory: Negative for cough, chest tightness and shortness of breath.   Cardiovascular: Negative for chest pain and leg swelling.  Gastrointestinal: Positive for nausea and abdominal pain. Negative for vomiting, diarrhea and blood in stool.  Genitourinary: Negative for frequency, hematuria, flank pain and difficulty urinating.  Musculoskeletal: Negative for arthralgias and back pain.  Skin: Negative for rash.  Neurological: Negative for dizziness, speech difficulty, weakness, numbness and headaches.   Allergies  Codeine; Ivp dye; and Sulfa antibiotics  Home Medications   Current Outpatient Rx  Name  Route  Sig  Dispense  Refill  . albuterol (PROVENTIL) (2.5 MG/3ML) 0.083% nebulizer solution   Nebulization   Take 3 mLs (2.5 mg total) by nebulization every 4 (four) hours as needed for wheezing or shortness of breath.   30 vial   0   . aspirin 81 MG tablet   Oral   Take 81 mg by mouth daily.         Marland Kitchen Dolutegravir Sodium (TIVICAY PO)   Oral   Take by mouth.         . doxycycline (VIBRAMYCIN) 100 MG capsule  Oral   Take 1 capsule (100 mg total) by mouth 2 (two) times daily. One po bid x 7 days   14 capsule   0   . emtricitabine-tenofovir (TRUVADA) 200-300 MG per tablet   Oral   Take 1 tablet by mouth daily.         Marland Kitchen omeprazole (PRILOSEC) 40 MG capsule      Take 1 capsule every morning. Take at least 30 minutes before taking Carafate.   30 capsule   0   . ondansetron (ZOFRAN ODT) 4 MG disintegrating tablet   Oral   Take 1 tablet (4 mg total) by mouth every 8 (eight) hours as needed.   10 tablet   0   . oxyCODONE-acetaminophen (PERCOCET) 5-325 MG per tablet   Oral   Take 2 tablets by mouth every 4 (four) hours as needed.   15 tablet   0   .  predniSONE (DELTASONE) 10 MG tablet   Oral   Take 2 tablets (20 mg total) by mouth daily.   10 tablet   0   . promethazine (PHENERGAN) 25 MG tablet   Oral   Take 1 tablet (25 mg total) by mouth every 6 (six) hours as needed for nausea.   12 tablet   0   . sucralfate (CARAFATE) 1 G tablet      Take 1 tablet 3 times a day with meals. Take your morning dose of Tivicay 2 hours before your first dose of Carafate. Take her evening dose of Tivicay 6 hours after your last dose of Carafate.   21 tablet   0    Triage Vitals: BP 135/85  Pulse 78  Temp(Src) 98.6 F (37 C)  Resp 18  Ht 5\' 11"  (1.803 m)  Wt 197 lb (89.359 kg)  BMI 27.49 kg/m2  SpO2 97%  Physical Exam  Constitutional: She is oriented to person, place, and time. She appears well-developed and well-nourished.  HENT:  Head: Normocephalic and atraumatic.  No evidence of thrush.   Eyes: Pupils are equal, round, and reactive to light.  Neck: Normal range of motion. Neck supple.  Cardiovascular: Normal rate, regular rhythm and normal heart sounds.   Pulmonary/Chest: Effort normal and breath sounds normal. No respiratory distress. She has no wheezes. She has no rales. She exhibits no tenderness.  Abdominal: Soft. Bowel sounds are normal. There is tenderness. There is no rebound and no guarding.  Tenderness to the epigastrium and around the periumbilical area.   Musculoskeletal: Normal range of motion. She exhibits no edema.  Lymphadenopathy:    She has no cervical adenopathy.  Neurological: She is alert and oriented to person, place, and time.  Skin: Skin is warm and dry. No rash noted.  Psychiatric: She has a normal mood and affect.    ED Course  Procedures (including critical care time)  DIAGNOSTIC STUDIES: Oxygen Saturation is 97% on room air, normal by my interpretation.    COORDINATION OF CARE: 3:43 PM- Ordered UA and blood labs. Will review patient's past medical records from [facility]. Will order a GI  cocktail to manage symptoms. Discussed treatment plan with patient at bedside and patient verbalized agreement.   5:15 PM- Patient reports that her symptoms have completely resolved after receiving the medications. Discussed that lab results were negative. Advised patient to continue taking her medications as prescribed and to follow up with the referred gastroenterologist. Advised her to increase her clear fluid intake for the next few days. Discussed treatment  plan with patient at bedside and patient verbalized agreement.    Labs Review Results for orders placed during the hospital encounter of 02/13/14  URINALYSIS, ROUTINE W REFLEX MICROSCOPIC      Result Value Ref Range   Color, Urine YELLOW  YELLOW   APPearance CLEAR  CLEAR   Specific Gravity, Urine 1.014  1.005 - 1.030   pH 6.0  5.0 - 8.0   Glucose, UA NEGATIVE  NEGATIVE mg/dL   Hgb urine dipstick NEGATIVE  NEGATIVE   Bilirubin Urine NEGATIVE  NEGATIVE   Ketones, ur NEGATIVE  NEGATIVE mg/dL   Protein, ur NEGATIVE  NEGATIVE mg/dL   Urobilinogen, UA 0.2  0.0 - 1.0 mg/dL   Nitrite NEGATIVE  NEGATIVE   Leukocytes, UA SMALL (*) NEGATIVE  URINE MICROSCOPIC-ADD ON      Result Value Ref Range   Squamous Epithelial / LPF RARE  RARE   WBC, UA 0-2  <3 WBC/hpf   RBC / HPF 0-2  <3 RBC/hpf   Bacteria, UA RARE  RARE  CBC WITH DIFFERENTIAL      Result Value Ref Range   WBC 4.6  4.0 - 10.5 K/uL   RBC 4.25  3.87 - 5.11 MIL/uL   Hemoglobin 13.3  12.0 - 15.0 g/dL   HCT 39.0  36.0 - 46.0 %   MCV 91.8  78.0 - 100.0 fL   MCH 31.3  26.0 - 34.0 pg   MCHC 34.1  30.0 - 36.0 g/dL   RDW 13.6  11.5 - 15.5 %   Platelets 153  150 - 400 K/uL   Neutrophils Relative % 46  43 - 77 %   Neutro Abs 2.1  1.7 - 7.7 K/uL   Lymphocytes Relative 43  12 - 46 %   Lymphs Abs 2.0  0.7 - 4.0 K/uL   Monocytes Relative 9  3 - 12 %   Monocytes Absolute 0.4  0.1 - 1.0 K/uL   Eosinophils Relative 2  0 - 5 %   Eosinophils Absolute 0.1  0.0 - 0.7 K/uL   Basophils  Relative 0  0 - 1 %   Basophils Absolute 0.0  0.0 - 0.1 K/uL  COMPREHENSIVE METABOLIC PANEL      Result Value Ref Range   Sodium 146  137 - 147 mEq/L   Potassium 4.1  3.7 - 5.3 mEq/L   Chloride 108  96 - 112 mEq/L   CO2 27  19 - 32 mEq/L   Glucose, Bld 101 (*) 70 - 99 mg/dL   BUN 12  6 - 23 mg/dL   Creatinine, Ser 1.40 (*) 0.50 - 1.10 mg/dL   Calcium 9.7  8.4 - 10.5 mg/dL   Total Protein 7.5  6.0 - 8.3 g/dL   Albumin 3.6  3.5 - 5.2 g/dL   AST 17  0 - 37 U/L   ALT 7  0 - 35 U/L   Alkaline Phosphatase 137 (*) 39 - 117 U/L   Total Bilirubin 0.3  0.3 - 1.2 mg/dL   GFR calc non Af Amer 41 (*) >90 mL/min   GFR calc Af Amer 47 (*) >90 mL/min  LIPASE, BLOOD      Result Value Ref Range   Lipase 15  11 - 59 U/L   No results found.   Imaging Review No results found.  EKG Interpretation   None       MDM   Final diagnoses:  Gastritis    Patient presents with abdominal pain. Her  symptoms completely resolved with a GI cocktail. I feel this is likely gastritis. I reviewed her CT of the abdomen and pelvis which was done on February 18 at [facility]. This did not show any evidence of acute disease. I do not feel that imaging studies need to be repeated today. Given that her pain is completely resolved with the GI cocktail I have low suspicion of gallbladder disease. I encouraged her to continue the Carafate and Prilosec. I advised her to maintain clear liquids for the next few days. She was given outpatient referral to follow up with gastroenterology.  I personally performed the services described in this documentation, which was scribed in my presence.  The recorded information has been reviewed and considered.     Malvin Johns, MD 02/13/14 [phone]

## 2014-02-13 NOTE — ED Notes (Signed)
Reports continued abdominal pain.  No relief with medications prescribed.  Denies vomiting/diarrhea/fever.  States pain is sharp/stabbing in the middle abdomen.  Recent evaluation here for same.

## 2014-02-24 IMAGING — CR DG CHEST 2V
2 series · 2 of 2 positions shown · non-contrast
Comparison: None.

CLINICAL DATA: The upper chest pain.  Dry cough.  Ear aches.  HIV.

EXAM:
CHEST  2 VIEW

[w chest pa]
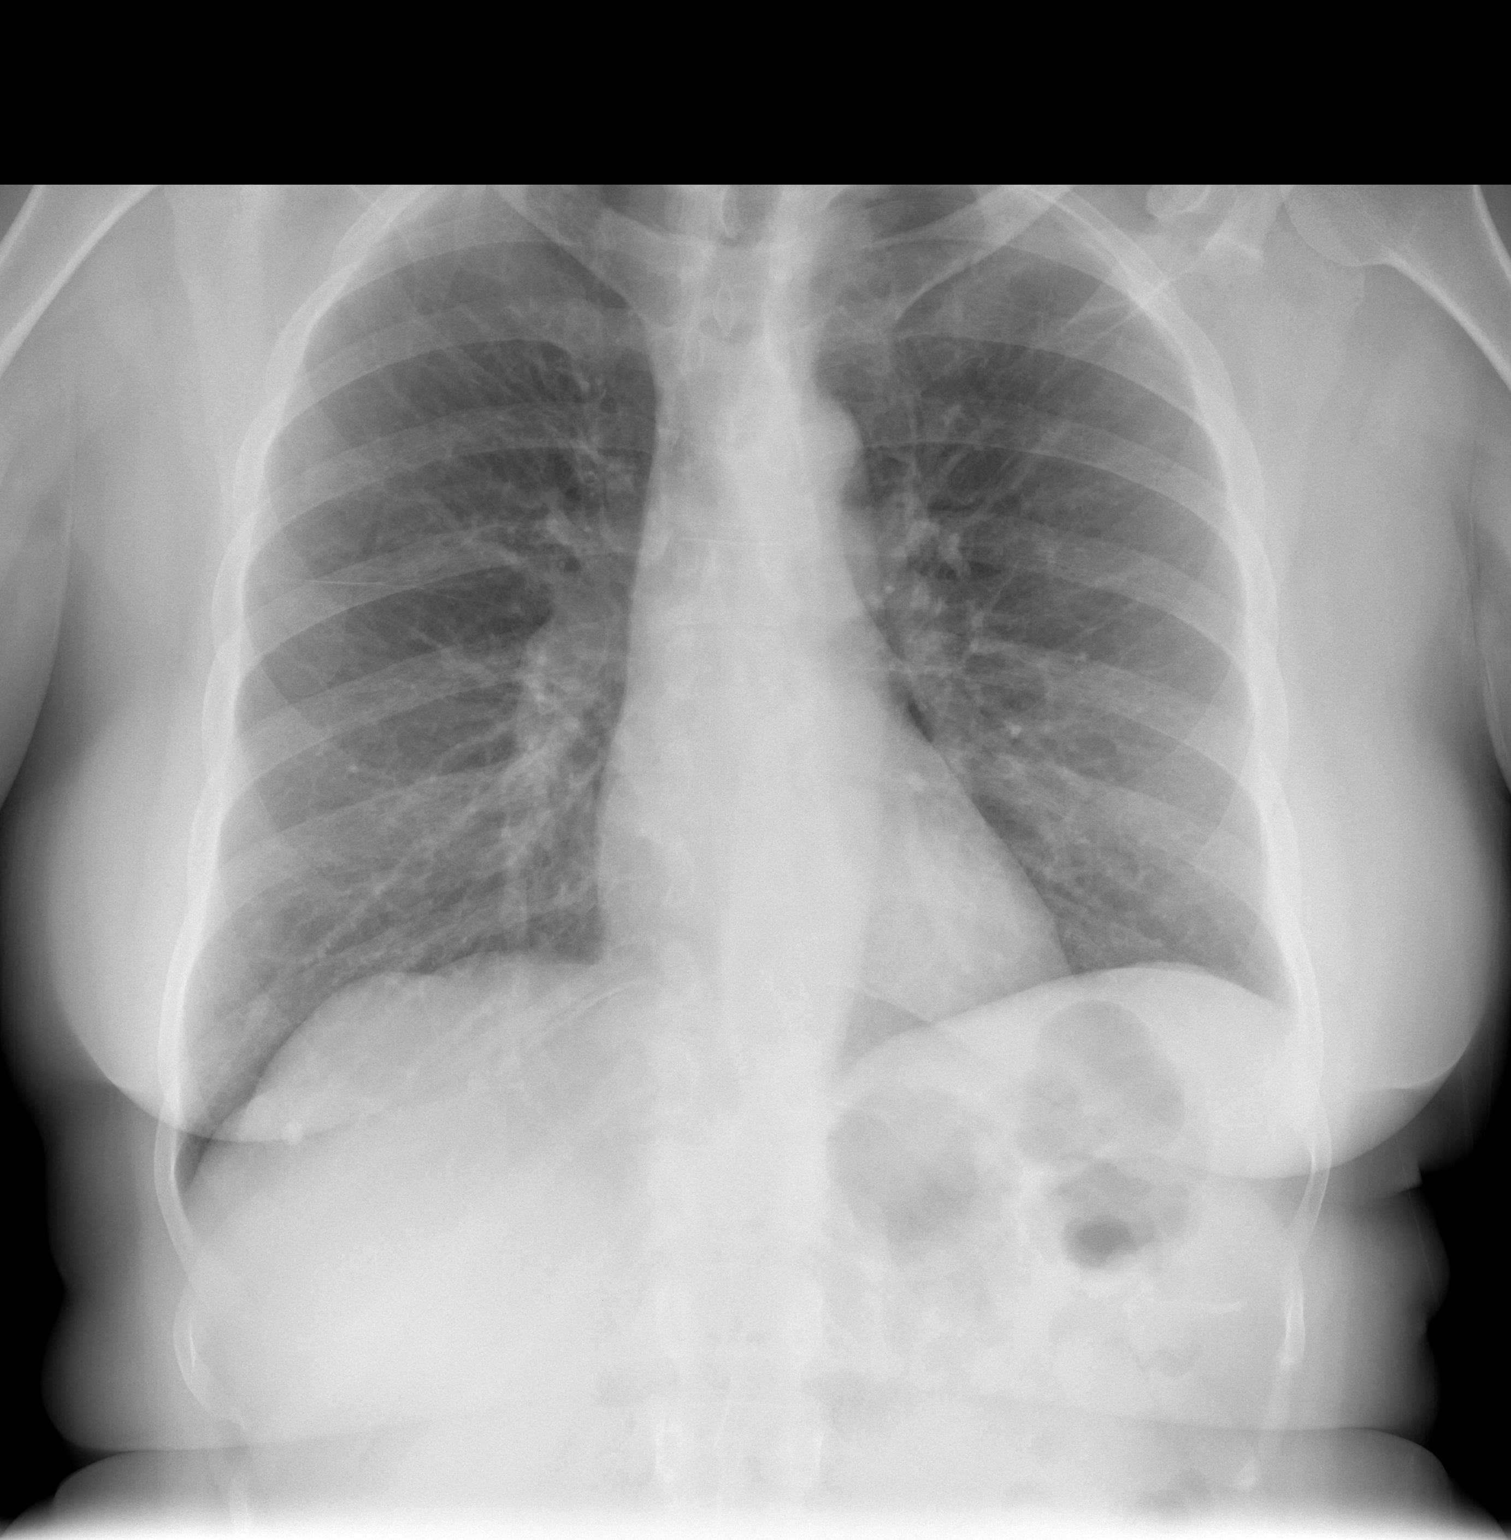

[w chest lat]
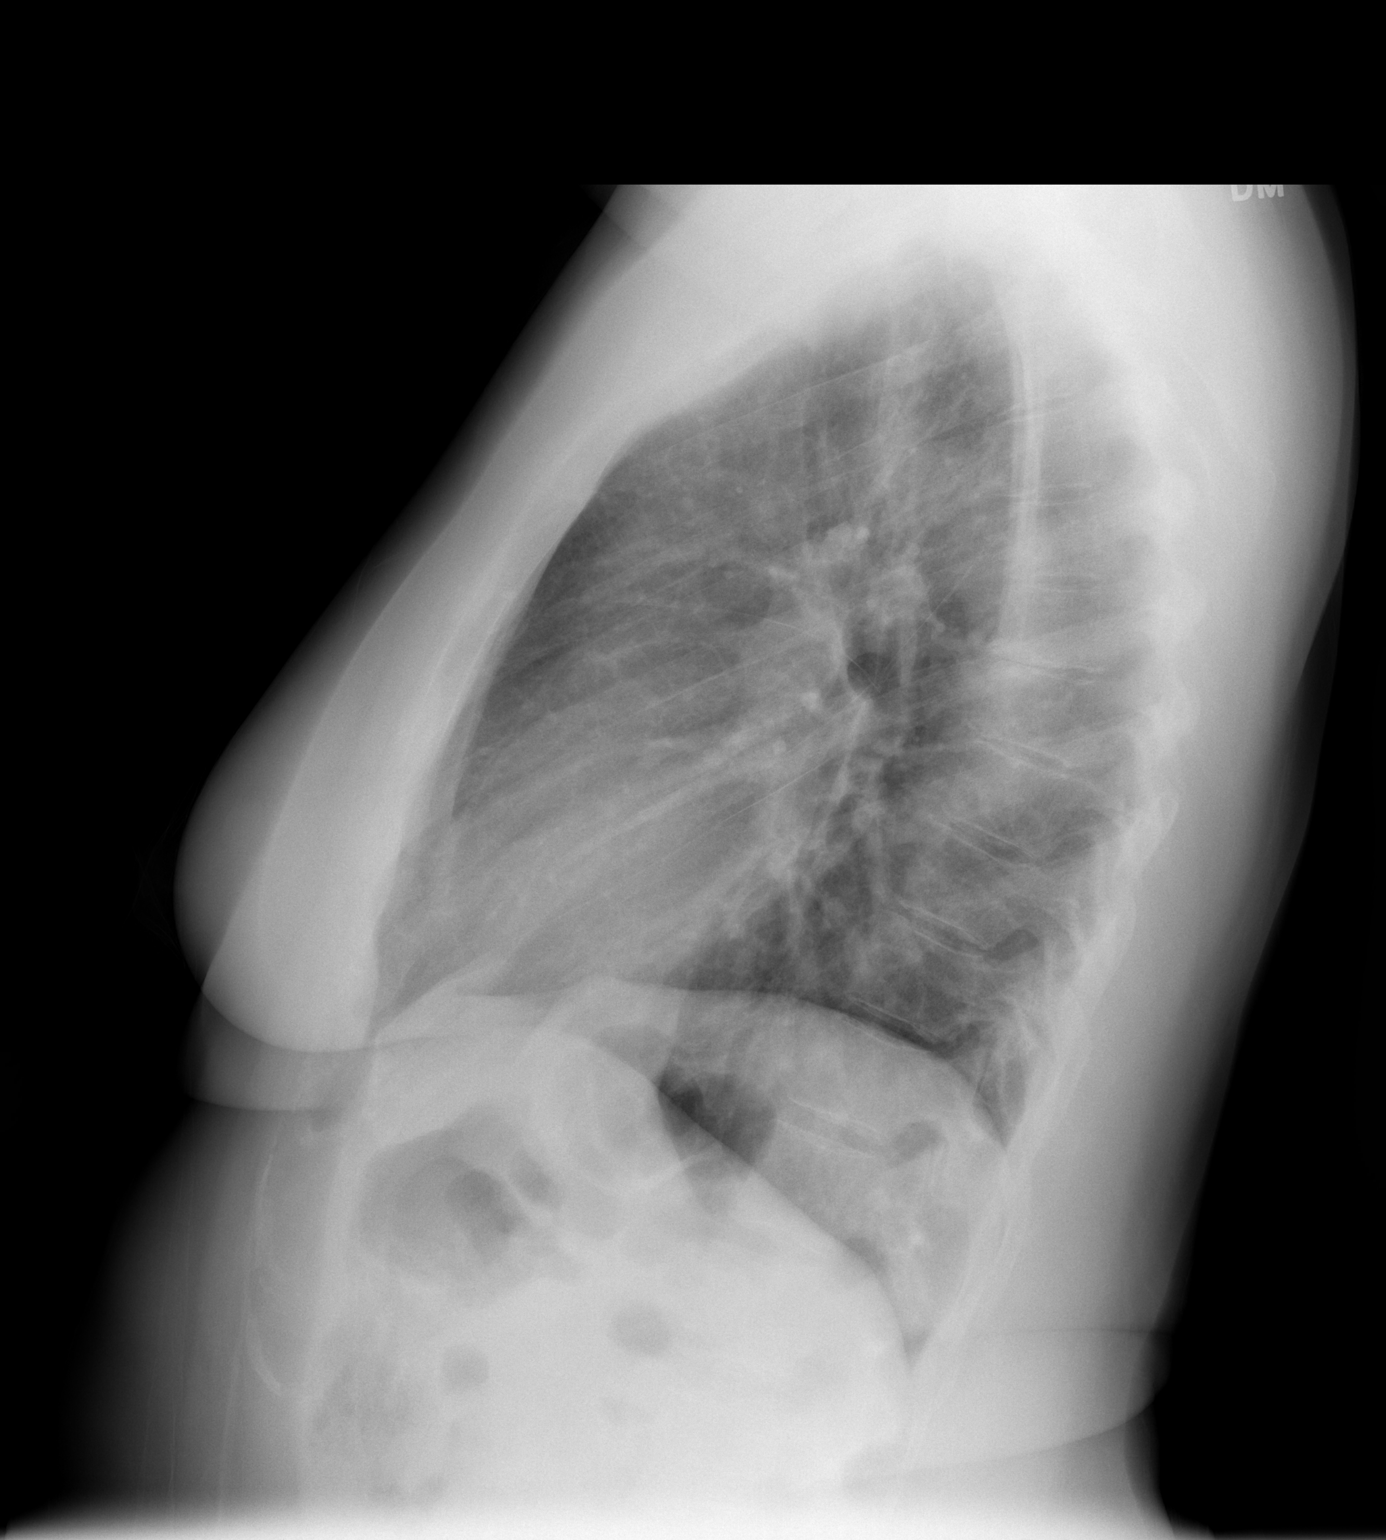

[2 of 2 positions shown; findings below may reference images not displayed]

FINDINGS: The lungs appear clear. The cardiac and mediastinal contours normal.

No pleural effusion identified.

Thoracic spondylosis and degenerative disc disease.
IMPRESSION: 1. No acute findings.  Thoracic spondylosis.

## 2014-04-09 ENCOUNTER — Encounter (HOSPITAL_BASED_OUTPATIENT_CLINIC_OR_DEPARTMENT_OTHER): Payer: Self-pay | Admitting: Emergency Medicine

## 2014-04-09 ENCOUNTER — Emergency Department (HOSPITAL_BASED_OUTPATIENT_CLINIC_OR_DEPARTMENT_OTHER): Payer: Medicare Other

## 2014-04-09 ENCOUNTER — Inpatient Hospital Stay (HOSPITAL_BASED_OUTPATIENT_CLINIC_OR_DEPARTMENT_OTHER)
Admission: EM | Admit: 2014-04-09 | Discharge: 2014-04-10 | DRG: 391 | Discharge status: Against Medical Advice | Payer: Medicare Other | Attending: Emergency Medicine | Admitting: Emergency Medicine

## 2014-04-09 DIAGNOSIS — B20 Human immunodeficiency virus [HIV] disease: Secondary | ICD-10-CM | POA: Diagnosis present

## 2014-04-09 DIAGNOSIS — R109 Unspecified abdominal pain: Secondary | ICD-10-CM | POA: Diagnosis not present

## 2014-04-09 DIAGNOSIS — N390 Urinary tract infection, site not specified: Secondary | ICD-10-CM | POA: Diagnosis present

## 2014-04-09 DIAGNOSIS — T189XXA Foreign body of alimentary tract, part unspecified, initial encounter: Secondary | ICD-10-CM | POA: Diagnosis present

## 2014-04-09 DIAGNOSIS — Z85038 Personal history of other malignant neoplasm of large intestine: Secondary | ICD-10-CM

## 2014-04-09 DIAGNOSIS — F17200 Nicotine dependence, unspecified, uncomplicated: Secondary | ICD-10-CM | POA: Diagnosis present

## 2014-04-09 LAB — CBC
HCT: 40.8 % (ref 36.0–46.0)
Hemoglobin: 14.4 g/dL (ref 12.0–15.0)
MCH: 32.4 pg (ref 26.0–34.0)
MCHC: 35.3 g/dL (ref 30.0–36.0)
MCV: 91.7 fL (ref 78.0–100.0)
PLATELETS: 167 10*3/uL (ref 150–400)
RBC: 4.45 MIL/uL (ref 3.87–5.11)
RDW: 13.6 % (ref 11.5–15.5)
WBC: 5.6 10*3/uL (ref 4.0–10.5)

## 2014-04-09 LAB — COMPREHENSIVE METABOLIC PANEL
ALT: 7 U/L (ref 0–35)
AST: 19 U/L (ref 0–37)
Albumin: 3.9 g/dL (ref 3.5–5.2)
Alkaline Phosphatase: 168 U/L — ABNORMAL HIGH (ref 39–117)
BUN: 14 mg/dL (ref 6–23)
CALCIUM: 10.2 mg/dL (ref 8.4–10.5)
CO2: 26 mEq/L (ref 19–32)
Chloride: 105 mEq/L (ref 96–112)
Creatinine, Ser: 1.4 mg/dL — ABNORMAL HIGH (ref 0.50–1.10)
GFR calc Af Amer: 47 mL/min — ABNORMAL LOW (ref >90)
GFR, EST NON AFRICAN AMERICAN: 41 mL/min — AB (ref >90)
Glucose, Bld: 121 mg/dL — ABNORMAL HIGH (ref 70–99)
POTASSIUM: 4 meq/L (ref 3.7–5.3)
SODIUM: 145 meq/L (ref 137–147)
Total Bilirubin: 0.5 mg/dL (ref 0.3–1.2)
Total Protein: 7.9 g/dL (ref 6.0–8.3)

## 2014-04-09 LAB — URINALYSIS, ROUTINE W REFLEX MICROSCOPIC
Bilirubin Urine: NEGATIVE
Glucose, UA: NEGATIVE mg/dL
Hgb urine dipstick: NEGATIVE
Ketones, ur: NEGATIVE mg/dL
Nitrite: NEGATIVE
Protein, ur: NEGATIVE mg/dL
Specific Gravity, Urine: 1.016 (ref 1.005–1.030)
Urobilinogen, UA: 1 mg/dL (ref 0.0–1.0)
pH: 6 (ref 5.0–8.0)

## 2014-04-09 LAB — URINE MICROSCOPIC-ADD ON

## 2014-04-09 LAB — LIPASE, BLOOD: Lipase: 47 U/L (ref 11–59)

## 2014-04-09 MED ORDER — ONDANSETRON HCL 4 MG/2ML IJ SOLN
4.0000 mg | Freq: Once | INTRAMUSCULAR | Status: AC
  Administered 2014-04-09: 4 mg via INTRAVENOUS

## 2014-04-09 MED ORDER — CEFTRIAXONE SODIUM 1 G IJ SOLR
INTRAMUSCULAR | Status: AC
  Filled 2014-04-09: qty 10

## 2014-04-09 MED ORDER — ONDANSETRON HCL 4 MG/2ML IJ SOLN
INTRAMUSCULAR | Status: AC
  Filled 2014-04-09: qty 2

## 2014-04-09 MED ORDER — MORPHINE SULFATE 4 MG/ML IJ SOLN
4.0000 mg | Freq: Once | INTRAMUSCULAR | Status: AC
  Administered 2014-04-09: 4 mg via INTRAVENOUS
  Filled 2014-04-09: qty 1

## 2014-04-09 MED ORDER — DEXTROSE 5 % IV SOLN
1.0000 g | Freq: Once | INTRAVENOUS | Status: AC
  Administered 2014-04-09: 1 g via INTRAVENOUS

## 2014-04-09 NOTE — Plan of Care (Addendum)
Abdominal pain in the lower abdomen for 2 days with some vomiting. Has a mild UTI but also found to have a foreign body in the small intestine non - obstructing- CT questions possible early bowel obstruction. Per surgery, she should be able to pass it and needs to be observed by the medical service. No need for surgery at this time. She is being transferred from [facility] to the medical floor to be admitted.

## 2014-04-09 NOTE — ED Notes (Signed)
HIV patient, present to ED for nausea and vomiting. Emesis x 1 today. Abdominal pain x 2 days, twisting pain, all quadrants, bowel sound present, sore on palpation.

## 2014-04-09 NOTE — ED Provider Notes (Signed)
CSN: [id number]     Arrival date & time 04/09/14  1957 History  This chart was scribed for Osvaldo Shipper, MD by Zettie Pho, ED Scribe. This patient was seen in room MH02/MH02 and the patient's care was started at 8:24 PM.    Chief Complaint  Patient presents with  . Emesis   Patient is a 58 y.o. female presenting with vomiting. The history is provided by the patient. No language interpreter was used.  Emesis Severity:  Mild Timing:  Rare Quality:  Stomach contents Progression:  Unchanged Chronicity:  New Relieved by:  Nothing Ineffective treatments:  Antiemetics (Zofran) Associated symptoms: abdominal pain   Associated symptoms: no diarrhea and no fever   Abdominal pain:    Location:  Generalized   Pain quality: twisting.   Severity:  Moderate   Onset quality:  Gradual   Timing:  Intermittent   Progression:  Unchanged   Chronicity:  Recurrent Risk factors: prior abdominal surgery   Risk factors: no alcohol use, no sick contacts and no suspect food intake    HPI Comments: ALISCIA CLAYTON is a 58 y.o. Female with a history of HIV, colon cancer with surgery (7 years ago), appendectomy, and GERD who presents to the Emergency Department complaining of an intermittent, "twisting" pain to the generalized abdomen onset last night with associated nausea and a few episodes of non-bilious, non-bloody emesis earlier today. She states that her current symptoms do not feel similar to her prior GERD. She reports taking Zofran at home without significant relief. Patient was seen here around 2 months ago on 02/13/2014 for some abdominal pain, but without emesis, and was treated in the ED with a GI cocktail and received labs that were unremarkable. Patient had imaging done at [facility] on 02/10/2014, which was normal, so repeat imaging was not done at her prior encounter here. Patient was advised to continue taking Carafate and Prilosec and was given an outpatient gastroenterology referral.  She states that she has not followed up with the gastroenterologist. Patient reports that her current symptoms are more severe than at her prior encounter. She denies any recent exposure to sick contacts or suspect food intake. She denies fever, diarrhea, dysuria, vaginal bleeding or discharge. Patient has allergies to codeine, IVP dye, and sulfa antibiotics.  Past Medical History  Diagnosis Date  . HIV disease   . Cancer    Past Surgical History  Procedure Laterality Date  . Colon surgery    . Appendectomy     No family history on file. History  Substance Use Topics  . Smoking status: Current Every Day Smoker -- 0.50 packs/day    Types: Cigarettes  . Smokeless tobacco: Not on file  . Alcohol Use: No   OB History   Grav Para Term Preterm Abortions TAB SAB Ect Mult Living                 Review of Systems  Gastrointestinal: Positive for nausea, vomiting and abdominal pain. Negative for diarrhea.  Genitourinary: Negative for dysuria, vaginal bleeding and vaginal discharge.  All other systems reviewed and are negative.     Allergies  Codeine; Ivp dye; and Sulfa antibiotics  Home Medications   Prior to Admission medications   Medication Sig Start Date End Date Taking? Authorizing Provider  albuterol (PROVENTIL) (2.5 MG/3ML) 0.083% nebulizer solution Take 3 mLs (2.5 mg total) by nebulization every 4 (four) hours as needed for wheezing or shortness of breath. 11/28/13   Malvin Johns,  MD  aspirin 81 MG tablet Take 81 mg by mouth daily.    Historical Provider, MD  Dolutegravir Sodium (TIVICAY PO) Take by mouth.    Historical Provider, MD  doxycycline (VIBRAMYCIN) 100 MG capsule Take 1 capsule (100 mg total) by mouth 2 (two) times daily. One po bid x 7 days 11/28/13   Malvin Johns, MD  emtricitabine-tenofovir (TRUVADA) 200-300 MG per tablet Take 1 tablet by mouth daily.    Historical Provider, MD  omeprazole (PRILOSEC) 40 MG capsule Take 1 capsule every morning. Take at least 30  minutes before taking Carafate. 02/11/14   John L Molpus, MD  ondansetron (ZOFRAN ODT) 4 MG disintegrating tablet Take 1 tablet (4 mg total) by mouth every 8 (eight) hours as needed. 02/11/14   Karen Chafe Molpus, MD  oxyCODONE-acetaminophen (PERCOCET) 5-325 MG per tablet Take 2 tablets by mouth every 4 (four) hours as needed. 11/28/13   Malvin Johns, MD  predniSONE (DELTASONE) 10 MG tablet Take 2 tablets (20 mg total) by mouth daily. 11/28/13   Malvin Johns, MD  promethazine (PHENERGAN) 25 MG tablet Take 1 tablet (25 mg total) by mouth every 6 (six) hours as needed for nausea. 06/15/13   Elwyn Lade, PA-C  sucralfate (CARAFATE) 1 G tablet Take 1 tablet 3 times a day with meals. Take your morning dose of Tivicay 2 hours before your first dose of Carafate. Take her evening dose of Tivicay 6 hours after your last dose of Carafate. 02/11/14   Karen Chafe Molpus, MD   Triage Vitals: BP 138/90  Pulse 81  Temp(Src) 98.5 F (36.9 C) (Oral)  Resp 20  Ht 5\' 10"  (1.778 m)  Wt 190 lb (86.183 kg)  BMI 27.26 kg/m2  SpO2 100%  Physical Exam  Nursing note and vitals reviewed. Constitutional: She is oriented to person, place, and time. She appears well-developed and well-nourished. No distress.  HENT:  Head: Normocephalic and atraumatic.  Eyes: Conjunctivae are normal.  Neck: Normal range of motion. Neck supple.  Cardiovascular: Normal rate, regular rhythm and normal heart sounds.   Pulmonary/Chest: Effort normal and breath sounds normal. No respiratory distress. She has no wheezes. She exhibits no tenderness.  Abdominal: Soft. Bowel sounds are normal. She exhibits no distension and no mass. There is tenderness. There is no rebound and no guarding.  Moderate, diffuse, lower abdominal tenderness without rebound or guarding.   Musculoskeletal: Normal range of motion.  Neurological: She is alert and oriented to person, place, and time.  Skin: Skin is warm and dry.  Psychiatric: She has a normal mood and affect.  Her behavior is normal.    ED Course  Procedures (including critical care time)  DIAGNOSTIC STUDIES: Oxygen Saturation is 100% on room air, normal by my interpretation.    COORDINATION OF CARE: 8:27 PM- Ordered UA. Ordered Zofran to manage symptoms. Discussed treatment plan with patient at bedside and patient verbalized agreement.   10:00 PM- Ordered a CT of the abdomen, CMP, and lipase.   Labs Review Labs Reviewed  URINALYSIS, ROUTINE W REFLEX MICROSCOPIC - Abnormal; Notable for the following:    Leukocytes, UA MODERATE (*)    All other components within normal limits  URINE MICROSCOPIC-ADD ON - Abnormal; Notable for the following:    Squamous Epithelial / LPF FEW (*)    All other components within normal limits    Imaging Review No results found.   EKG Interpretation None      MDM   Final diagnoses:  FB GI (  foreign body in gastrointestinal tract)  UTI (lower urinary tract infection)    6F here with vomiting, abdominal pain. Hx of HIV, colon cancer. On exam, vitals stable. Lower abdominal pain diffusely, mild central abdominal pain. Will check labs and scan. Labs show UTI - rocephin given.  On CT - foreign body seen in small intestine. Possible early small bowel obstruction. Not seen on prior. Patient denies prior biliary stenting, swallowing any FB. I spoke with Dr. Brantley Stage with [facility] - states unlikely to have surgery, no evidence of bowel obstruction at this time on his read, however will require admission for serial exams, observation. I spoke with medicine who will admit at [facility]. Patient refusing admission, has special needs daughter, doesn't want to go home. She understands the risk of possible bowel obstruction, death, knows she needs to return to the ER if symptoms continue/worsen. She is of sound mind and is capable of refusing admission. Patient signed [place] paperwork. Strict return precautions given.   I personally performed the services  described in this documentation, which was scribed in my presence. The recorded information has been reviewed and is accurate.   I have reviewed all labs and imaging and considered them in my medical decision making.     Osvaldo Shipper, MD 04/10/14 [phone]

## 2014-04-10 MED ORDER — OXYCODONE-ACETAMINOPHEN 5-325 MG PO TABS
1.0000 | ORAL_TABLET | Freq: Once | ORAL | Status: AC
  Administered 2014-04-10: 1 via ORAL
  Filled 2014-04-10: qty 1

## 2014-04-10 MED ORDER — ONDANSETRON 4 MG PO TBDP: ORAL_TABLET | ORAL | Status: DC

## 2014-04-10 MED ORDER — CIPROFLOXACIN HCL 500 MG PO TABS: 500.0000 mg | ORAL_TABLET | Freq: Two times a day (BID) | ORAL | Status: DC

## 2014-04-10 NOTE — Discharge Instructions (Signed)
Swallowed Foreign Body, Adult You have swallowed an object (foreign body). Once the foreign body has passed through the food tube (esophagus), which leads from the mouth to the stomach, it will usually continue through the body without problems. This is because the point where the esophagus enters into the stomach is the narrowest place through which the foreign body must pass. Sometimes the foreign body gets stuck. The most common type of foreign body obstruction in adults is food impaction. Many times, bones from fish or meat products may become lodged in the esophagus or injure the throat on the way down. When there is an object that obstructs the esophagus, the most obvious symptoms are pain and the inability to swallow normally. In some cases, foreign bodies that can be life threatening are swallowed. Examples of these are certain medications and illicit drugs. Often in these instances, patients are afraid of telling what they swallowed. However, it is extremely important to tell the emergency caregiver what was swallowed because life-saving treatment may be needed.  X-Farney exams may be taken to find the location of the foreign body. However, some objects do not show up well or may be too small to be seen on an X-Baldassari image. If the foreign body is too large or too sharp, it may be too dangerous to allow it to pass on its own. You may need to see a caregiver who specializes in the digestive system (gastroenterologist). In a few cases, a specialist may need to remove the object using a method called "endoscopy". This involves passing a thin, soft, flexible tube into the food pipe to locate and remove the object. Follow up with your primary doctor or the referral you were given by the emergency caregiver. HOME CARE INSTRUCTIONS   If your caregiver says it is safe for you to eat, then only have liquids and soft foods until your symptoms improve.  Once you are eating normally:  Cut food into small  pieces.  Remove small bones from food.  Remove large seeds and pits from fruit.  Chew your food well.  Do not talk, laugh, or engage in physical activity while eating or swallowing. SEEK MEDICAL CARE IF:  You develop worsening shortness of breath, uncontrollable coughing, chest pains or high fever, greater than 102 F (38.9 C).  You are unable to eat or drink or you feel that food is getting stuck in your throat.  You have choking symptoms or cannot stop drooling.  You develop abdominal pain, vomiting (especially of blood), or rectal bleeding. MAKE SURE YOU:   Understand these instructions.  Will watch your condition.  Will get help right away if you are not doing well or get worse. Document Released: 05/30/2010 Document Revised: 03/03/2012 Document Reviewed: 05/30/2010 [facility] Patient Information 2014 [place].  Urinary Tract Infection Urinary tract infections (UTIs) can develop anywhere along your urinary tract. Your urinary tract is your body's drainage system for removing wastes and extra water. Your urinary tract includes two kidneys, two ureters, a bladder, and a urethra. Your kidneys are a pair of bean-shaped organs. Each kidney is about the size of your fist. They are located below your ribs, one on each side of your spine. CAUSES Infections are caused by microbes, which are microscopic organisms, including fungi, viruses, and bacteria. These organisms are so small that they can only be seen through a microscope. Bacteria are the microbes that most commonly cause UTIs. SYMPTOMS  Symptoms of UTIs may vary by age and gender of  the patient and by the location of the infection. Symptoms in young women typically include a frequent and intense urge to urinate and a painful, burning feeling in the bladder or urethra during urination. Older women and men are more likely to be tired, shaky, and weak and have muscle aches and abdominal pain. A fever may mean the infection is  in your kidneys. Other symptoms of a kidney infection include pain in your back or sides below the ribs, nausea, and vomiting. DIAGNOSIS To diagnose a UTI, your caregiver will ask you about your symptoms. Your caregiver also will ask to provide a urine sample. The urine sample will be tested for bacteria and white blood cells. White blood cells are made by your body to help fight infection. TREATMENT  Typically, UTIs can be treated with medication. Because most UTIs are caused by a bacterial infection, they usually can be treated with the use of antibiotics. The choice of antibiotic and length of treatment depend on your symptoms and the type of bacteria causing your infection. HOME CARE INSTRUCTIONS  If you were prescribed antibiotics, take them exactly as your caregiver instructs you. Finish the medication even if you feel better after you have only taken some of the medication.  Drink enough water and fluids to keep your urine clear or pale yellow.  Avoid caffeine, tea, and carbonated beverages. They tend to irritate your bladder.  Empty your bladder often. Avoid holding urine for long periods of time.  Empty your bladder before and after sexual intercourse.  After a bowel movement, women should cleanse from front to back. Use each tissue only once. SEEK MEDICAL CARE IF:   You have back pain.  You develop a fever.  Your symptoms do not begin to resolve within 3 days. SEEK IMMEDIATE MEDICAL CARE IF:   You have severe back pain or lower abdominal pain.  You develop chills.  You have nausea or vomiting.  You have continued burning or discomfort with urination. MAKE SURE YOU:   Understand these instructions.  Will watch your condition.  Will get help right away if you are not doing well or get worse. Document Released: 09/19/2005 Document Revised: 06/10/2012 Document Reviewed: 01/18/2012 [facility] Patient Information 2014 [place].

## 2014-04-10 NOTE — ED Notes (Signed)
Pt is refusing admission.  Understands that she is accepting all responsibility for the outcome.  Pt A&O x4.

## 2014-09-15 ENCOUNTER — Other Ambulatory Visit (HOSPITAL_COMMUNITY): Payer: Self-pay | Admitting: Orthopaedic Surgery

## 2014-09-15 NOTE — Progress Notes (Signed)
Please put orders in Epic surgery 10-01-14 pre op 09-23-14 Thanks

## 2014-09-16 ENCOUNTER — Encounter (HOSPITAL_COMMUNITY): Payer: Self-pay | Admitting: Pharmacy Technician

## 2014-09-23 ENCOUNTER — Encounter (HOSPITAL_COMMUNITY)
Admission: RE | Admit: 2014-09-23 | Discharge: 2014-09-23 | Disposition: A | Discharge status: Home / Self Care | Payer: Medicare Other | Source: Ambulatory Visit | Attending: Orthopaedic Surgery | Admitting: Orthopaedic Surgery

## 2014-09-23 ENCOUNTER — Encounter (HOSPITAL_COMMUNITY): Payer: Self-pay

## 2014-09-23 ENCOUNTER — Encounter (INDEPENDENT_AMBULATORY_CARE_PROVIDER_SITE_OTHER): Payer: Self-pay

## 2014-09-23 DIAGNOSIS — Z79899 Other long term (current) drug therapy: Secondary | ICD-10-CM | POA: Diagnosis not present

## 2014-09-23 DIAGNOSIS — Z01812 Encounter for preprocedural laboratory examination: Secondary | ICD-10-CM | POA: Diagnosis not present

## 2014-09-23 DIAGNOSIS — M879 Osteonecrosis, unspecified: Secondary | ICD-10-CM | POA: Diagnosis present

## 2014-09-23 DIAGNOSIS — Z7982 Long term (current) use of aspirin: Secondary | ICD-10-CM | POA: Insufficient documentation

## 2014-09-23 DIAGNOSIS — B20 Human immunodeficiency virus [HIV] disease: Secondary | ICD-10-CM | POA: Insufficient documentation

## 2014-09-23 HISTORY — DX: Anxiety disorder, unspecified: F41.9

## 2014-09-23 HISTORY — DX: Personal history of urinary calculi: Z87.442

## 2014-09-23 HISTORY — DX: Gastro-esophageal reflux disease without esophagitis: K21.9

## 2014-09-23 HISTORY — DX: Unspecified osteoarthritis, unspecified site: M19.90

## 2014-09-23 HISTORY — DX: Anemia, unspecified: D64.9

## 2014-09-23 HISTORY — DX: Sleep disorder, unspecified: G47.9

## 2014-09-23 HISTORY — DX: Idiopathic aseptic necrosis of unspecified femur: M87.059

## 2014-09-23 HISTORY — DX: Personal history of other medical treatment: Z92.89

## 2014-09-23 LAB — URINALYSIS, ROUTINE W REFLEX MICROSCOPIC
Bilirubin Urine: NEGATIVE
Glucose, UA: NEGATIVE mg/dL
Ketones, ur: NEGATIVE mg/dL
NITRITE: POSITIVE — AB
Protein, ur: NEGATIVE mg/dL
SPECIFIC GRAVITY, URINE: 1.014 (ref 1.005–1.030)
UROBILINOGEN UA: 0.2 mg/dL (ref 0.0–1.0)
pH: 6 (ref 5.0–8.0)

## 2014-09-23 LAB — URINE MICROSCOPIC-ADD ON

## 2014-09-23 LAB — BASIC METABOLIC PANEL
Anion gap: 12 (ref 5–15)
BUN: 16 mg/dL (ref 6–23)
CALCIUM: 9.7 mg/dL (ref 8.4–10.5)
CO2: 27 meq/L (ref 19–32)
Chloride: 105 mEq/L (ref 96–112)
Creatinine, Ser: 1.37 mg/dL — ABNORMAL HIGH (ref 0.50–1.10)
GFR calc Af Amer: 48 mL/min — ABNORMAL LOW (ref >90)
GFR, EST NON AFRICAN AMERICAN: 42 mL/min — AB (ref >90)
Glucose, Bld: 75 mg/dL (ref 70–99)
Potassium: 4.3 mEq/L (ref 3.7–5.3)
SODIUM: 144 meq/L (ref 137–147)

## 2014-09-23 LAB — SURGICAL PCR SCREEN
MRSA, PCR: POSITIVE — AB
Staphylococcus aureus: POSITIVE — AB

## 2014-09-23 LAB — CBC
HEMATOCRIT: 37.2 % (ref 36.0–46.0)
Hemoglobin: 12.4 g/dL (ref 12.0–15.0)
MCH: 30.7 pg (ref 26.0–34.0)
MCHC: 33.3 g/dL (ref 30.0–36.0)
MCV: 92.1 fL (ref 78.0–100.0)
PLATELETS: 186 10*3/uL (ref 150–400)
RBC: 4.04 MIL/uL (ref 3.87–5.11)
RDW: 14.9 % (ref 11.5–15.5)
WBC: 5.6 10*3/uL (ref 4.0–10.5)

## 2014-09-23 LAB — PROTIME-INR
INR: 1.06 (ref 0.00–1.49)
Prothrombin Time: 13.9 seconds (ref 11.6–15.2)

## 2014-09-23 LAB — APTT: aPTT: 26 seconds (ref 24–37)

## 2014-09-23 LAB — ABO/RH: ABO/RH(D): O POS

## 2014-09-23 NOTE — Patient Instructions (Addendum)
Ashley Shepherd  09/23/2014                           YOUR PROCEDURE IS SCHEDULED ON: 10/01/14               ENTER THRU [place] MAIN [facility] ENTRANCE AND                            FOLLOW  SIGNS TO SHORT STAY CENTER                 ARRIVE AT SHORT STAY AT: 8:00 AM               CALL THIS NUMBER IF ANY PROBLEMS THE DAY OF SURGERY :               [phone]                                REMEMBER:   Do not eat food or drink liquids AFTER MIDNIGHT                  Take these medicines the morning of surgery with               A SIPS OF WATER :  TIVICAY / TRUVADA / SELZENTRY / PRILOSEC IF NEEDED        Do not wear jewelry, make-up   Do not wear lotions, powders, or perfumes.   Do not shave legs or underarms 12 hrs. before surgery (men may shave face)  Do not bring valuables to the hospital.  Contacts, dentures or bridgework may not be worn into surgery.  Leave suitcase in the car. After surgery it may be brought to your room.  For patients admitted to the hospital more than one night, checkout time is            11:00 AM                                                         ________________________________________________________________________                                                                                                  [place]  Before surgery, you can play an important role.  Because skin is not sterile, your skin needs to be as free of germs as possible.  You can reduce the number of germs on your skin by washing with CHG (chlorahexidine gluconate) soap before surgery.  CHG is an antiseptic cleaner which kills germs and bonds with the skin to continue killing germs even after washing. Please DO NOT use if you have an allergy to CHG or antibacterial soaps.  If your skin becomes reddened/irritated stop using  the CHG and inform your nurse when you arrive at Short Stay. Do not shave (including legs and underarms) for  at least 48 hours prior to the first CHG shower.  You may shave your face. Please follow these instructions carefully:   1.  Shower with CHG Soap the night before surgery and the  morning of Surgery.   2.  If you choose to wash your hair, wash your hair first as usual with your  normal  Shampoo.   3.  After you shampoo, rinse your hair and body thoroughly to remove the  shampoo.                                         4.  Use CHG as you would any other liquid soap.  You can apply chg directly  to the skin and wash . Gently wash with scrungie or clean wascloth    5.  Apply the CHG Soap to your body ONLY FROM THE NECK DOWN.   Do not use on open                           Wound or open sores. Avoid contact with eyes, ears mouth and genitals (private parts).                        Genitals (private parts) with your normal soap.              6.  Wash thoroughly, paying special attention to the area where your surgery  will be performed.   7.  Thoroughly rinse your body with warm water from the neck down.   8.  DO NOT shower/wash with your normal soap after using and rinsing off  the CHG Soap .                9.  Pat yourself dry with a clean towel.             10.  Wear clean pajamas.             11.  Place clean sheets on your bed the night of your first shower and do not  sleep with pets.  Day of Surgery : Do not apply any lotions/deodorants the morning of surgery.  Please wear clean clothes to the hospital/surgery center.  FAILURE TO FOLLOW THESE INSTRUCTIONS MAY RESULT IN THE CANCELLATION OF YOUR SURGERY    PATIENT SIGNATURE_________________________________  ______________________________________________________________________    WHAT IS A BLOOD TRANSFUSION? Blood Transfusion Information  A transfusion is the replacement of blood or some of its parts. Blood is made up of multiple cells which provide different functions.  Red blood cells carry oxygen and are used for blood  loss replacement.  White blood cells fight against infection.  Platelets control bleeding.  Plasma helps clot blood.  Other blood products are available for specialized needs, such as hemophilia or other clotting disorders. BEFORE THE TRANSFUSION  Who gives blood for transfusions?   Healthy volunteers who are fully evaluated to make sure their blood is safe. This is blood bank blood. Transfusion therapy is the safest it has ever been in the practice of medicine. Before blood is taken from a donor, a complete history is taken to make sure that person has no history of diseases nor engages in risky  social behavior (examples are intravenous drug use or sexual activity with multiple partners). The donor's travel history is screened to minimize risk of transmitting infections, such as malaria. The donated blood is tested for signs of infectious diseases, such as HIV and hepatitis. The blood is then tested to be sure it is compatible with you in order to minimize the chance of a transfusion reaction. If you or a relative donates blood, this is often done in anticipation of surgery and is not appropriate for emergency situations. It takes many days to process the donated blood. RISKS AND COMPLICATIONS Although transfusion therapy is very safe and saves many lives, the main dangers of transfusion include:   Getting an infectious disease.  Developing a transfusion reaction. This is an allergic reaction to something in the blood you were given. Every precaution is taken to prevent this. The decision to have a blood transfusion has been considered carefully by your caregiver before blood is given. Blood is not given unless the benefits outweigh the risks. AFTER THE TRANSFUSION  Right after receiving a blood transfusion, you will usually feel much better and more energetic. This is especially true if your red blood cells have gotten low (anemic). The transfusion raises the level of the red blood cells  which carry oxygen, and this usually causes an energy increase.  The nurse administering the transfusion will monitor you carefully for complications. HOME CARE INSTRUCTIONS  No special instructions are needed after a transfusion. You may find your energy is better. Speak with your caregiver about any limitations on activity for underlying diseases you may have. SEEK MEDICAL CARE IF:   Your condition is not improving after your transfusion.  You develop redness or irritation at the intravenous (IV) site. SEEK IMMEDIATE MEDICAL CARE IF:  Any of the following symptoms occur over the next 12 hours:  Shaking chills.  You have a temperature by mouth above 102 F (38.9 C), not controlled by medicine.  Chest, back, or muscle pain.  People around you feel you are not acting correctly or are confused.  Shortness of breath or difficulty breathing.  Dizziness and fainting.  You get a rash or develop hives.  You have a decrease in urine output.  Your urine turns a dark color or changes to pink, red, or brown. Any of the following symptoms occur over the next 10 days:  You have a temperature by mouth above 102 F (38.9 C), not controlled by medicine.  Shortness of breath.  Weakness after normal activity.  The white part of the eye turns yellow (jaundice).  You have a decrease in the amount of urine or are urinating less often.  Your urine turns a dark color or changes to pink, red, or brown. Document Released: 12/07/2000 Document Revised: 03/03/2012 Document Reviewed: 07/26/2008 [facility] Patient Information 2014 [place], [place].  _______________________________________________________________________

## 2014-09-24 NOTE — Progress Notes (Signed)
Abnormal UA / PCR faxed to Dr. Ninfa Linden - office informed by phone - pt notified of results of PCR - Rx Mupuricin called to [facility] [place] - [phone]

## 2014-10-01 ENCOUNTER — Encounter (HOSPITAL_COMMUNITY)
Admission: RE | Disposition: A | Discharge status: Home Health | Payer: Self-pay | Source: Ambulatory Visit | Attending: Orthopaedic Surgery

## 2014-10-01 ENCOUNTER — Encounter (HOSPITAL_COMMUNITY): Payer: Medicare Other | Admitting: Anesthesiology

## 2014-10-01 ENCOUNTER — Encounter (HOSPITAL_COMMUNITY): Payer: Self-pay | Admitting: *Deleted

## 2014-10-01 ENCOUNTER — Inpatient Hospital Stay (HOSPITAL_COMMUNITY): Payer: Medicare Other

## 2014-10-01 ENCOUNTER — Inpatient Hospital Stay (HOSPITAL_COMMUNITY)
Admission: RE | Admit: 2014-10-01 | Discharge: 2014-10-03 | DRG: 469 | Disposition: A | Discharge status: Home Health | Payer: Medicare Other | Source: Ambulatory Visit | Attending: Orthopaedic Surgery | Admitting: Orthopaedic Surgery

## 2014-10-01 ENCOUNTER — Inpatient Hospital Stay (HOSPITAL_COMMUNITY): Payer: Medicare Other | Admitting: Anesthesiology

## 2014-10-01 DIAGNOSIS — M87852 Other osteonecrosis, left femur: Principal | ICD-10-CM | POA: Diagnosis present

## 2014-10-01 DIAGNOSIS — B20 Human immunodeficiency virus [HIV] disease: Secondary | ICD-10-CM | POA: Diagnosis not present

## 2014-10-01 DIAGNOSIS — Z6825 Body mass index (BMI) 25.0-25.9, adult: Secondary | ICD-10-CM

## 2014-10-01 DIAGNOSIS — F419 Anxiety disorder, unspecified: Secondary | ICD-10-CM | POA: Diagnosis present

## 2014-10-01 DIAGNOSIS — Z79899 Other long term (current) drug therapy: Secondary | ICD-10-CM | POA: Diagnosis not present

## 2014-10-01 DIAGNOSIS — Z7982 Long term (current) use of aspirin: Secondary | ICD-10-CM | POA: Diagnosis not present

## 2014-10-01 DIAGNOSIS — D62 Acute posthemorrhagic anemia: Secondary | ICD-10-CM | POA: Diagnosis not present

## 2014-10-01 DIAGNOSIS — K219 Gastro-esophageal reflux disease without esophagitis: Secondary | ICD-10-CM | POA: Diagnosis present

## 2014-10-01 DIAGNOSIS — F17210 Nicotine dependence, cigarettes, uncomplicated: Secondary | ICD-10-CM | POA: Diagnosis present

## 2014-10-01 DIAGNOSIS — M25552 Pain in left hip: Secondary | ICD-10-CM | POA: Diagnosis present

## 2014-10-01 DIAGNOSIS — Z96642 Presence of left artificial hip joint: Secondary | ICD-10-CM

## 2014-10-01 DIAGNOSIS — Z01812 Encounter for preprocedural laboratory examination: Secondary | ICD-10-CM

## 2014-10-01 DIAGNOSIS — M169 Osteoarthritis of hip, unspecified: Secondary | ICD-10-CM

## 2014-10-01 DIAGNOSIS — Z96649 Presence of unspecified artificial hip joint: Secondary | ICD-10-CM

## 2014-10-01 DIAGNOSIS — M87052 Idiopathic aseptic necrosis of left femur: Secondary | ICD-10-CM

## 2014-10-01 HISTORY — PX: TOTAL HIP ARTHROPLASTY: SHX124

## 2014-10-01 LAB — TYPE AND SCREEN
ABO/RH(D): O POS
Antibody Screen: NEGATIVE

## 2014-10-01 SURGERY — ARTHROPLASTY, HIP, TOTAL, ANTERIOR APPROACH
Anesthesia: Spinal | Site: Hip | Laterality: Left

## 2014-10-01 MED ORDER — VANCOMYCIN HCL 10 G IV SOLR
1250.0000 mg | INTRAVENOUS | Status: AC
  Administered 2014-10-01 – 2014-10-02 (×2): 1250 mg via INTRAVENOUS
  Filled 2014-10-01 (×2): qty 1250

## 2014-10-01 MED ORDER — ACETAMINOPHEN 325 MG PO TABS
650.0000 mg | ORAL_TABLET | Freq: Four times a day (QID) | ORAL | Status: DC | PRN
  Administered 2014-10-02: 650 mg via ORAL
  Filled 2014-10-01: qty 2

## 2014-10-01 MED ORDER — 0.9 % SODIUM CHLORIDE (POUR BTL) OPTIME
TOPICAL | Status: DC | PRN
  Administered 2014-10-01: 1000 mL

## 2014-10-01 MED ORDER — METHOCARBAMOL 1000 MG/10ML IJ SOLN
500.0000 mg | Freq: Four times a day (QID) | INTRAVENOUS | Status: DC | PRN
  Administered 2014-10-01 – 2014-10-02 (×2): 500 mg via INTRAVENOUS
  Filled 2014-10-01 (×3): qty 5

## 2014-10-01 MED ORDER — SODIUM CHLORIDE 0.9 % IR SOLN
Status: DC | PRN
  Administered 2014-10-01: 1000 mL

## 2014-10-01 MED ORDER — METHOCARBAMOL 500 MG PO TABS
500.0000 mg | ORAL_TABLET | Freq: Four times a day (QID) | ORAL | Status: DC | PRN
  Administered 2014-10-02 – 2014-10-03 (×4): 500 mg via ORAL
  Filled 2014-10-01 (×4): qty 1

## 2014-10-01 MED ORDER — SODIUM CHLORIDE 0.9 % IV SOLN
INTRAVENOUS | Status: DC
  Administered 2014-10-01: 16:00:00 via INTRAVENOUS

## 2014-10-01 MED ORDER — PROPOFOL 10 MG/ML IV BOLUS
INTRAVENOUS | Status: AC
  Filled 2014-10-01: qty 20

## 2014-10-01 MED ORDER — PROMETHAZINE HCL 25 MG/ML IJ SOLN: 6.2500 mg | INTRAMUSCULAR | Status: DC | PRN

## 2014-10-01 MED ORDER — PROPOFOL INFUSION 10 MG/ML OPTIME
INTRAVENOUS | Status: DC | PRN
  Administered 2014-10-01: 150 ug/kg/min via INTRAVENOUS

## 2014-10-01 MED ORDER — OXYCODONE HCL 5 MG PO TABS
5.0000 mg | ORAL_TABLET | ORAL | Status: DC | PRN
  Administered 2014-10-01 – 2014-10-03 (×10): 10 mg via ORAL
  Filled 2014-10-01 (×10): qty 2

## 2014-10-01 MED ORDER — POLYETHYLENE GLYCOL 3350 17 G PO PACK
17.0000 g | PACK | Freq: Every day | ORAL | Status: DC | PRN
  Administered 2014-10-02: 17 g via ORAL
  Filled 2014-10-01: qty 1

## 2014-10-01 MED ORDER — ASPIRIN EC 325 MG PO TBEC
325.0000 mg | DELAYED_RELEASE_TABLET | Freq: Two times a day (BID) | ORAL | Status: DC
  Administered 2014-10-01 – 2014-10-03 (×4): 325 mg via ORAL
  Filled 2014-10-01 (×6): qty 1

## 2014-10-01 MED ORDER — MARAVIROC 300 MG PO TABS
300.0000 mg | ORAL_TABLET | Freq: Two times a day (BID) | ORAL | Status: DC
  Administered 2014-10-01 – 2014-10-03 (×5): 300 mg via ORAL
  Filled 2014-10-01 (×6): qty 1

## 2014-10-01 MED ORDER — PHENYLEPHRINE HCL 10 MG/ML IJ SOLN
INTRAMUSCULAR | Status: DC | PRN
  Administered 2014-10-01 (×2): 80 ug via INTRAVENOUS

## 2014-10-01 MED ORDER — MENTHOL 3 MG MT LOZG: 1.0000 | LOZENGE | OROMUCOSAL | Status: DC | PRN

## 2014-10-01 MED ORDER — PHENYLEPHRINE HCL 10 MG/ML IJ SOLN
10.0000 mg | INTRAVENOUS | Status: DC | PRN
  Administered 2014-10-01: 10 ug/min via INTRAVENOUS

## 2014-10-01 MED ORDER — ACETAMINOPHEN 650 MG RE SUPP: 650.0000 mg | Freq: Four times a day (QID) | RECTAL | Status: DC | PRN

## 2014-10-01 MED ORDER — BUPIVACAINE HCL (PF) 0.5 % IJ SOLN
INTRAMUSCULAR | Status: AC
  Filled 2014-10-01: qty 30

## 2014-10-01 MED ORDER — HYDROMORPHONE HCL 1 MG/ML IJ SOLN
INTRAMUSCULAR | Status: AC
  Filled 2014-10-01: qty 1

## 2014-10-01 MED ORDER — DEXAMETHASONE SODIUM PHOSPHATE 10 MG/ML IJ SOLN
INTRAMUSCULAR | Status: DC | PRN
  Administered 2014-10-01: 10 mg via INTRAVENOUS

## 2014-10-01 MED ORDER — DEXAMETHASONE SODIUM PHOSPHATE 10 MG/ML IJ SOLN
INTRAMUSCULAR | Status: AC
  Filled 2014-10-01: qty 1

## 2014-10-01 MED ORDER — OXYCODONE HCL 5 MG PO TABS: 5.0000 mg | ORAL_TABLET | Freq: Once | ORAL | Status: DC | PRN

## 2014-10-01 MED ORDER — DOLUTEGRAVIR SODIUM 50 MG PO TABS
50.0000 mg | ORAL_TABLET | Freq: Every day | ORAL | Status: DC
Start: 2014-10-01 — End: 2014-10-03
  Administered 2014-10-01 – 2014-10-03 (×3): 50 mg via ORAL
  Filled 2014-10-01 (×3): qty 1

## 2014-10-01 MED ORDER — CEFAZOLIN SODIUM-DEXTROSE 2-3 GM-% IV SOLR
INTRAVENOUS | Status: AC
  Filled 2014-10-01: qty 50

## 2014-10-01 MED ORDER — ONDANSETRON HCL 4 MG/2ML IJ SOLN
4.0000 mg | Freq: Four times a day (QID) | INTRAMUSCULAR | Status: DC | PRN
  Administered 2014-10-01: 4 mg via INTRAVENOUS
  Filled 2014-10-01: qty 2

## 2014-10-01 MED ORDER — PHENYLEPHRINE HCL 10 MG/ML IJ SOLN
INTRAMUSCULAR | Status: AC
  Filled 2014-10-01: qty 1

## 2014-10-01 MED ORDER — ALUM & MAG HYDROXIDE-SIMETH 200-200-20 MG/5ML PO SUSP: 30.0000 mL | ORAL | Status: DC | PRN

## 2014-10-01 MED ORDER — HYDROMORPHONE HCL 1 MG/ML IJ SOLN
0.2500 mg | INTRAMUSCULAR | Status: DC | PRN
  Administered 2014-10-01: 0.5 mg via INTRAVENOUS

## 2014-10-01 MED ORDER — ZOLPIDEM TARTRATE 5 MG PO TABS
5.0000 mg | ORAL_TABLET | Freq: Every evening | ORAL | Status: DC | PRN
  Administered 2014-10-03: 5 mg via ORAL
  Filled 2014-10-01: qty 1

## 2014-10-01 MED ORDER — MIDAZOLAM HCL 5 MG/5ML IJ SOLN
INTRAMUSCULAR | Status: DC | PRN
  Administered 2014-10-01 (×2): 1 mg via INTRAVENOUS

## 2014-10-01 MED ORDER — PROPOFOL 10 MG/ML IV BOLUS
INTRAVENOUS | Status: DC | PRN
  Administered 2014-10-01: 50 mg via INTRAVENOUS

## 2014-10-01 MED ORDER — PANTOPRAZOLE SODIUM 40 MG PO TBEC
80.0000 mg | DELAYED_RELEASE_TABLET | Freq: Every day | ORAL | Status: DC
  Administered 2014-10-01 – 2014-10-03 (×3): 80 mg via ORAL
  Filled 2014-10-01 (×3): qty 2

## 2014-10-01 MED ORDER — PHENOL 1.4 % MT LIQD: 1.0000 | OROMUCOSAL | Status: DC | PRN

## 2014-10-01 MED ORDER — DOCUSATE SODIUM 100 MG PO CAPS
100.0000 mg | ORAL_CAPSULE | Freq: Two times a day (BID) | ORAL | Status: DC
  Administered 2014-10-01 – 2014-10-03 (×4): 100 mg via ORAL
  Filled 2014-10-01 (×5): qty 1

## 2014-10-01 MED ORDER — ONDANSETRON HCL 4 MG/2ML IJ SOLN
INTRAMUSCULAR | Status: AC
  Filled 2014-10-01: qty 2

## 2014-10-01 MED ORDER — ONDANSETRON HCL 4 MG PO TABS: 4.0000 mg | ORAL_TABLET | Freq: Four times a day (QID) | ORAL | Status: DC | PRN

## 2014-10-01 MED ORDER — HYDROMORPHONE HCL 1 MG/ML IJ SOLN
1.0000 mg | INTRAMUSCULAR | Status: DC | PRN
  Administered 2014-10-01 – 2014-10-02 (×4): 1 mg via INTRAVENOUS
  Filled 2014-10-01 (×4): qty 1

## 2014-10-01 MED ORDER — ALPRAZOLAM 1 MG PO TABS
1.0000 mg | ORAL_TABLET | Freq: Every evening | ORAL | Status: DC | PRN
  Administered 2014-10-01 – 2014-10-02 (×2): 1 mg via ORAL
  Filled 2014-10-01 (×2): qty 1

## 2014-10-01 MED ORDER — FENTANYL CITRATE 0.05 MG/ML IJ SOLN
INTRAMUSCULAR | Status: AC
Start: 2014-10-01 — End: 2014-10-01
  Filled 2014-10-01: qty 2

## 2014-10-01 MED ORDER — MIDAZOLAM HCL 2 MG/2ML IJ SOLN
INTRAMUSCULAR | Status: AC
Start: 2014-10-01 — End: 2014-10-01
  Filled 2014-10-01: qty 2

## 2014-10-01 MED ORDER — OXYCODONE HCL 5 MG/5ML PO SOLN: 5.0000 mg | Freq: Once | ORAL | Status: DC | PRN

## 2014-10-01 MED ORDER — CEFAZOLIN SODIUM-DEXTROSE 2-3 GM-% IV SOLR
2.0000 g | INTRAVENOUS | Status: AC
  Administered 2014-10-01: 2 g via INTRAVENOUS

## 2014-10-01 MED ORDER — EMTRICITABINE-TENOFOVIR DF 200-300 MG PO TABS
1.0000 | ORAL_TABLET | Freq: Every day | ORAL | Status: DC
  Administered 2014-10-01 – 2014-10-03 (×3): 1 via ORAL
  Filled 2014-10-01 (×3): qty 1

## 2014-10-01 MED ORDER — METOCLOPRAMIDE HCL 5 MG/ML IJ SOLN: 5.0000 mg | Freq: Three times a day (TID) | INTRAMUSCULAR | Status: DC | PRN

## 2014-10-01 MED ORDER — ONDANSETRON HCL 4 MG/2ML IJ SOLN
INTRAMUSCULAR | Status: DC | PRN
  Administered 2014-10-01: 4 mg via INTRAVENOUS

## 2014-10-01 MED ORDER — METOCLOPRAMIDE HCL 10 MG PO TABS
5.0000 mg | ORAL_TABLET | Freq: Three times a day (TID) | ORAL | Status: DC | PRN
  Administered 2014-10-01 – 2014-10-02 (×2): 10 mg via ORAL
  Filled 2014-10-01 (×2): qty 1

## 2014-10-01 MED ORDER — DIPHENHYDRAMINE HCL 12.5 MG/5ML PO ELIX
12.5000 mg | ORAL_SOLUTION | ORAL | Status: DC | PRN
  Administered 2014-10-02 – 2014-10-03 (×2): 25 mg via ORAL
  Filled 2014-10-01 (×2): qty 10

## 2014-10-01 MED ORDER — FENTANYL CITRATE 0.05 MG/ML IJ SOLN
INTRAMUSCULAR | Status: DC | PRN
  Administered 2014-10-01: 100 ug via INTRAVENOUS

## 2014-10-01 MED ORDER — MEPERIDINE HCL 50 MG/ML IJ SOLN: 6.2500 mg | INTRAMUSCULAR | Status: DC | PRN

## 2014-10-01 MED ORDER — LACTATED RINGERS IV SOLN
INTRAVENOUS | Status: DC
  Administered 2014-10-01: 1000 mL via INTRAVENOUS
  Administered 2014-10-01: 12:00:00 via INTRAVENOUS

## 2014-10-01 MED ORDER — PHENYLEPHRINE 40 MCG/ML (10ML) SYRINGE FOR IV PUSH (FOR BLOOD PRESSURE SUPPORT)
PREFILLED_SYRINGE | INTRAVENOUS | Status: AC
  Filled 2014-10-01: qty 10

## 2014-10-01 SURGICAL SUPPLY — 46 items
APL SKNCLS STERI-STRIP NONHPOA (GAUZE/BANDAGES/DRESSINGS) ×1
BAG SPEC THK2 15X12 ZIP CLS (MISCELLANEOUS) ×1
BAG ZIPLOCK 12X15 (MISCELLANEOUS) ×2 IMPLANT
BENZOIN TINCTURE PRP APPL 2/3 (GAUZE/BANDAGES/DRESSINGS) ×2 IMPLANT
BLADE SAW SGTL 18X1.27X75 (BLADE) ×2 IMPLANT
CAPT HIP PF COP ×2 IMPLANT
CELLS DAT CNTRL 66122 CELL SVR (MISCELLANEOUS) ×1 IMPLANT
COVER PERINEAL POST (MISCELLANEOUS) ×2 IMPLANT
DRAPE C-ARM 42X120 X-RAY (DRAPES) ×2 IMPLANT
DRAPE STERI IOBAN 125X83 (DRAPES) ×2 IMPLANT
DRAPE U-SHAPE 47X51 STRL (DRAPES) ×6 IMPLANT
DRSG AQUACEL AG ADV 3.5X10 (GAUZE/BANDAGES/DRESSINGS) ×2 IMPLANT
DURAPREP 26ML APPLICATOR (WOUND CARE) ×2 IMPLANT
ELECT BLADE TIP CTD 4 INCH (ELECTRODE) ×2 IMPLANT
ELECT REM PT RETURN 9FT ADLT (ELECTROSURGICAL) ×2
ELECTRODE REM PT RTRN 9FT ADLT (ELECTROSURGICAL) ×1 IMPLANT
FACESHIELD WRAPAROUND (MASK) ×8 IMPLANT
GLOVE BIO SURGEON STRL SZ7.5 (GLOVE) ×4 IMPLANT
GLOVE BIO SURGEON STRL SZ8 (GLOVE) ×2 IMPLANT
GLOVE BIOGEL PI IND STRL 6.5 (GLOVE) ×2 IMPLANT
GLOVE BIOGEL PI IND STRL 7.5 (GLOVE) ×1 IMPLANT
GLOVE BIOGEL PI IND STRL 8 (GLOVE) ×2 IMPLANT
GLOVE BIOGEL PI IND STRL 8.5 (GLOVE) ×1 IMPLANT
GLOVE BIOGEL PI INDICATOR 6.5 (GLOVE) ×2
GLOVE BIOGEL PI INDICATOR 7.5 (GLOVE) ×1
GLOVE BIOGEL PI INDICATOR 8 (GLOVE) ×2
GLOVE BIOGEL PI INDICATOR 8.5 (GLOVE) ×1
GLOVE ECLIPSE 8.0 STRL XLNG CF (GLOVE) ×2 IMPLANT
GLOVE SURG SS PI 6.5 STRL IVOR (GLOVE) ×2 IMPLANT
GOWN BRE IMP SLV SIRUS LXLNG (GOWN DISPOSABLE) ×2 IMPLANT
GOWN STRL REUS W/TWL XL LVL3 (GOWN DISPOSABLE) ×8 IMPLANT
HANDPIECE INTERPULSE COAX TIP (DISPOSABLE) ×2
KIT BASIN OR (CUSTOM PROCEDURE TRAY) ×2 IMPLANT
PACK TOTAL JOINT (CUSTOM PROCEDURE TRAY) ×2 IMPLANT
RTRCTR WOUND ALEXIS 18CM MED (MISCELLANEOUS) ×2
SET HNDPC FAN SPRY TIP SCT (DISPOSABLE) ×1 IMPLANT
STRIP CLOSURE SKIN 1/2X4 (GAUZE/BANDAGES/DRESSINGS) ×2 IMPLANT
SUT ETHIBOND NAB CT1 #1 30IN (SUTURE) ×2 IMPLANT
SUT MNCRL AB 4-0 PS2 18 (SUTURE) ×2 IMPLANT
SUT VIC AB 0 CT1 36 (SUTURE) ×2 IMPLANT
SUT VIC AB 1 CT1 36 (SUTURE) ×2 IMPLANT
SUT VIC AB 2-0 CT1 27 (SUTURE) ×4
SUT VIC AB 2-0 CT1 TAPERPNT 27 (SUTURE) ×2 IMPLANT
TOWEL OR 17X26 10 PK STRL BLUE (TOWEL DISPOSABLE) ×2 IMPLANT
TOWEL OR NON WOVEN STRL DISP B (DISPOSABLE) ×2 IMPLANT
TRAY FOLEY CATH 14FRSI W/METER (CATHETERS) ×2 IMPLANT

## 2014-10-01 NOTE — Anesthesia Preprocedure Evaluation (Signed)
Anesthesia Evaluation  Patient identified by MRN, date of birth, ID band Patient awake    Reviewed: Allergy & Precautions, H&P , NPO status , Patient's Chart, lab work & pertinent test results  Airway Mallampati: II TM Distance: >3 FB Neck ROM: Full    Dental no notable dental hx.    Pulmonary Current Smoker,  breath sounds clear to auscultation  Pulmonary exam normal       Cardiovascular negative cardio ROS  Rhythm:Regular Rate:Normal     Neuro/Psych PSYCHIATRIC DISORDERS Anxiety negative neurological ROS     GI/Hepatic Neg liver ROS, GERD-  ,  Endo/Other  negative endocrine ROS  Renal/GU negative Renal ROS     Musculoskeletal negative musculoskeletal ROS (+) Arthritis -, Osteoarthritis,    Abdominal   Peds  Hematology negative hematology ROS (+) anemia , HIV,   Anesthesia Other Findings   Reproductive/Obstetrics negative OB ROS                           Anesthesia Physical Anesthesia Plan  ASA: II  Anesthesia Plan:    Post-op Pain Management:    Induction:   Airway Management Planned:   Additional Equipment:   Intra-op Plan:   Post-operative Plan:   Informed Consent: I have reviewed the patients History and Physical, chart, labs and discussed the procedure including the risks, benefits and alternatives for the proposed anesthesia with the patient or authorized representative who has indicated his/her understanding and acceptance.   Dental advisory given  Plan Discussed with: CRNA  Anesthesia Plan Comments:         Anesthesia Quick Evaluation

## 2014-10-01 NOTE — Transfer of Care (Signed)
Immediate Anesthesia Transfer of Care Note  Patient: Ashley Shepherd  Procedure(s) Performed: Procedure(s): LEFT TOTAL HIP ARTHROPLASTY ANTERIOR APPROACH (Left)  Patient Location: PACU  Anesthesia Type:Spinal  Level of Consciousness: awake and patient cooperative  Airway & Oxygen Therapy: Patient Spontanous Breathing and Patient connected to face mask oxygen  Post-op Assessment: Report given to PACU RN and Post -op Vital signs reviewed and stable  Post vital signs: Reviewed and stable  Complications: No apparent anesthesia complications

## 2014-10-01 NOTE — Anesthesia Procedure Notes (Signed)
Spinal  Patient location during procedure: OR Staffing Anesthesiologist: Nolon Nations R Performed by: anesthesiologist  Preanesthetic Checklist Completed: patient identified, site marked, surgical consent, pre-op evaluation, timeout performed, IV checked, risks and benefits discussed and monitors and equipment checked Spinal Block Patient position: sitting Prep: Betadine Patient monitoring: heart rate, continuous pulse ox and blood pressure Approach: left paramedian Location: L3-4 Injection technique: single-shot Needle Needle type: Sprotte  Needle gauge: 24 G Needle length: 9 cm Assessment Sensory level: T8 Additional Notes Expiration date of kit checked and confirmed. Patient tolerated procedure well, without complications.

## 2014-10-01 NOTE — Op Note (Signed)
NAMEMORGIN, HALLS NO.:  [id number]  MEDICAL RECORD NO.:  [id number]  LOCATION:  [facility]                         FACILITY:  [facility]  PHYSICIAN:  Lind Guest. Ninfa Linden, M.D.DATE OF BIRTH:  10/09/1956  DATE OF PROCEDURE:  10/01/2014 DATE OF DISCHARGE:                              OPERATIVE REPORT   PREOPERATIVE DIAGNOSIS:  Stage IV avascular necrosis, left hip.  POSTOPERATIVE DIAGNOSIS:  Stage IV avascular necrosis, left hip.  PROCEDURE:  Left total hip arthroplasty through direct anterior approach with press-fit components (uncemented).  IMPLANTS:  DePuy Sector Gription acetabular component size 48 with apex hole eliminator guide and 1 screw, size 32+ 4 neutral polyethylene liner, size 11 Corail femoral component with standard offset, size 32+ 1 ceramic hip ball.  SURGEON:  Lind Guest. Ninfa Linden, M.D.  ASSISTANT:  Erskine Emery, PA-C.  ANESTHESIA:  Spinal.  ANTIBIOTICS:  2 g IV Ancef.  BLOOD LOSS:  Less than 500 mL.  COMPLICATIONS:  None.  INDICATIONS:  Ms. Koppel is a 58 year old female with a significant avascular necrosis involving her left hip.  She has cystic changes in the femoral head with femoral head collapse.  She has cystic changes in the acetabulum as well.  It is recommended due to her debilitating pain and her decreased mobility, that she undergo a left total hip arthroplasty.  She understands the risks of acute blood loss anemia, nerve and vessel injury, fracture, infection, DVT, and dislocation.  She understands the goals are decreased pain, improved mobility, and overall improved quality of life.  PROCEDURE DESCRIPTION:  After informed consent was obtained, appropriate left hip was marked.  She was brought to the operating room and while she was on her stretcher, spinal anesthesia was obtained.  She was then laid in supine position.  A Foley catheter was placed and traction boots were placed on both of her feet.  Next, she was  placed supine on the Hana fracture table with the perineal post in place and both legs in inline skeletal traction devices, but no traction applied.  We then prepped the left hip with DuraPrep and sterile drapes.  A time-out was called.  She was identified as the correct patient, correct left hip. We then made an incision inferior and posterior to the anterior superior iliac spine and carried this obliquely down the leg.  We dissected down the tensor fascia lata muscle and tensor fascia was divided longitudinally, just proximal to the iliotibial band.  We then proceeded with a direct anterior approach to the hip.  We found and identified the lateral femoral circumflex vessels and cauterized these.  We placed a Cobra retractor around the lateral neck and underneath the rectus femoris and a Cobra retractor around the femoral neck.  We then opened up the hip capsule in a L-type format and found a large joint effusion. We placed the Cobra retractors within the hip capsule.  Next, we made a femoral neck cut with an oscillating saw and completed this with an osteotome.  I placed a corkscrew guide in the femoral head and removed the femoral head easily in its entirety.  We found it to have areas of collapse in a  very friable cartilage.  We then cleaned the acetabulum, debris, and remnants of acetabular labrum and fovea.  We then placed a bent Hohmann medially and the Cobra retractor laterally.  Under direct visualization and direct fluoroscopy, we began reaming from a size 42 reamer and 2 mm increments up to a size 48.  The last reamer we definitely performed under fluoroscopy as well to obtain her depth of reaming, our inclination, and anteversion.  Once I was pleased with this, we placed the real Sector Gription acetabular component size 48, apex hole eliminator guide and a single screw which gave [place] good purchase.  We then put the real 32+ 4 neutral polyethylene liner for that size of  acetabular component.  Attention was then turned to the femur with leg externally rotated to 100 degrees, extended and adducted. We were able to use a Mueller retractor medially and Hohmann retractor behind the greater trochanter.  I released the lateral joint capsule and used a box cutting osteotome to enter the femoral canal and a rongeur to lateralize.  We then began broaching using a Corail broaching system from a size 8 up to a size of 11.  With a size 11, we used a calcar planer and then placed a standard neck and a 32+ 1 trial hip ball.  We brought the leg back up and over and with traction and internal rotation and reducing the pelvis, her offset leg lengths were measured and were equal.  I could externally rotate her to 100 degrees and not dislocate her and internally rotated to 45 degrees as well.  She had minimal shuck.  We then dislocated the hip and removed the trial components.  I then placed the real Corail femoral component size 11 with standard offset and the real 32+ 1 ceramic hip ball.  We reduced this back in the acetabulum and again it was stable.  We then copiously irrigated the soft tissue with normal saline solution, and closed the joint capsule with interrupted #1 Ethibond suture followed by running #1 Vicryl in the tensor fascia, 2-0 Vicryl in the deep tissue, 2-0 Vicryl in subcutaneous tissue, 4-0 Monocryl subcuticular stitch and Steri-Strips.  An Aquacel dressing was applied.  She was taken off the Hana table and to the recovery room in stable condition.  All final counts were correct and no complications noted.  Of note, Erskine Emery, [facility] assisted during the entire case and his assistance was crucial for facilitating all aspects of this case.     Lind Guest. Ninfa Linden, M.D.     CYB/MEDQ  D:  10/01/2014  T:  10/01/2014  Job:  [id number]

## 2014-10-01 NOTE — H&P (Signed)
TOTAL HIP ADMISSION H&P  Patient is admitted for left total hip arthroplasty.  Subjective:  Chief Complaint: left hip pain  HPI: Ashley Shepherd, 58 y.o. female, has a history of pain and functional disability in the left hip(s) due to avascular necrosis and patient has failed non-surgical conservative treatments for greater than 12 weeks to include NSAID's and/or analgesics, corticosteriod injections, flexibility and strengthening excercises, use of assistive devices and activity modification.  Onset of symptoms was gradual starting 3 years ago with gradually worsening course since that time.The patient noted no past surgery on the left hip(s).  Patient currently rates pain in the left hip at 9 out of 10 with activity. Patient has night pain, worsening of pain with activity and weight bearing, trendelenberg gait, pain that interfers with activities of daily living and pain with passive range of motion. Patient has evidence of subchondral cysts, periarticular osteophytes and joint space narrowing by imaging studies. This condition presents safety issues increasing the risk of falls.  There is no current active infection.  Patient Active Problem List   Diagnosis Date Noted  . Avascular necrosis of bone of left hip 10/01/2014  . FB GI (foreign body in gastrointestinal tract) 04/09/2014   Past Medical History  Diagnosis Date  . HIV disease   . Cancer     7 years ago, cancer free now  . Avascular necrosis of hip     left  . Arthritis   . GERD (gastroesophageal reflux disease)   . History of kidney stones   . History of transfusion   . Anemia   . Difficulty sleeping   . Anxiety     Past Surgical History  Procedure Laterality Date  . Appendectomy    . Knee arthroscopy      left  . Colon surgery  2008  . Colostomy reversal  2013  . Port-a-cath removal      No prescriptions prior to admission   Allergies  Allergen Reactions  . Codeine Itching  . Ivp Dye [Iodinated Diagnostic Agents]  Itching  . Sulfa Antibiotics Itching    History  Substance Use Topics  . Smoking status: Current Every Day Smoker -- 0.50 packs/day    Types: Cigarettes  . Smokeless tobacco: Not on file  . Alcohol Use: No    No family history on file.   Review of Systems  Musculoskeletal: Positive for joint pain.  All other systems reviewed and are negative.   Objective:  Physical Exam  Constitutional: She is oriented to person, place, and time. She appears well-developed and well-nourished.  HENT:  Head: Normocephalic and atraumatic.  Eyes: EOM are normal. Pupils are equal, round, and reactive to light.  Neck: Normal range of motion. Neck supple.  Cardiovascular: Regular rhythm.   Respiratory: Effort normal and breath sounds normal.  GI: Soft. Bowel sounds are normal.  Musculoskeletal:       Left hip: She exhibits decreased range of motion, decreased strength, bony tenderness and crepitus.  Neurological: She is alert and oriented to person, place, and time.  Skin: Skin is warm and dry.  Psychiatric: She has a normal mood and affect.    Vital signs in last 24 hours:    Labs:   Estimated body mass index is 27.26 kg/(m^2) as calculated from the following:   Height as of 04/09/14: 5\' 10"  (1.778 m).   Weight as of 04/09/14: 86.183 kg (190 lb).   Imaging Review Plain radiographs demonstrate severe avascular necrosis of the left hip(s).  The bone quality appears to be good for age and reported activity level.  Assessment/Plan:  End stage arthritis, left hip(s)  The patient history, physical examination, clinical judgement of the provider and imaging studies are consistent with end stage degenerative joint disease of the left hip(s) and total hip arthroplasty is deemed medically necessary. The treatment options including medical management, injection therapy, arthroscopy and arthroplasty were discussed at length. The risks and benefits of total hip arthroplasty were presented and reviewed.  The risks due to aseptic loosening, infection, stiffness, dislocation/subluxation,  thromboembolic complications and other imponderables were discussed.  The patient acknowledged the explanation, agreed to proceed with the plan and consent was signed. Patient is being admitted for inpatient treatment for surgery, pain control, PT, OT, prophylactic antibiotics, VTE prophylaxis, progressive ambulation and ADL's and discharge planning.The patient is planning to be discharged home with home health services

## 2014-10-01 NOTE — Evaluation (Signed)
Physical Therapy Evaluation Patient Details Name: Ashley Shepherd MRN: [id number] DOB: 1956/08/17 Today's Date: 10/01/2014   History of Present Illness  L THR  Clinical Impression  Pt s/p L THR presents with decreased L LE strength/ROM and post op pain limiting functional mobility.  Pt should progress well to d.c home with family assist and HHPT follow up.    Follow Up Recommendations Home health PT    Equipment Recommendations  None recommended by PT    Recommendations for Other Services OT consult     Precautions / Restrictions Precautions Precautions: Fall Restrictions Weight Bearing Restrictions: No Other Position/Activity Restrictions: WBAT      Mobility  Bed Mobility Overal bed mobility: Needs Assistance Bed Mobility: Supine to Sit     Supine to sit: Min assist;Mod assist     General bed mobility comments: cues for sequence and use of R LE to self assist  Transfers Overall transfer level: Needs assistance Equipment used: Rolling walker (2 wheeled) Transfers: Sit to/from Stand Sit to Stand: Min assist;Mod assist         General transfer comment: cues for LE management and use of UEs to self assist  Ambulation/Gait Ambulation/Gait assistance: Min assist Ambulation Distance (Feet): 70 Feet Assistive device: Rolling walker (2 wheeled) Gait Pattern/deviations: Step-to pattern;Decreased step length - right;Decreased step length - left;Shuffle;Trunk flexed Gait velocity: decr   General Gait Details: cues for posture, position from RW, initial sequence  Stairs            Wheelchair Mobility    Modified Rankin (Stroke Patients Only)       Balance                                             Pertinent Vitals/Pain Pain Assessment: 0-10 Pain Score: 5  Pain Location: L hip Pain Descriptors / Indicators: Aching;Sore Pain Intervention(s): Limited activity within patient's tolerance;Monitored during session;Premedicated before  session;Ice applied    Home Living Family/patient expects to be discharged to:: Private residence Living Arrangements: Alone Available Help at Discharge: Family Type of Home: House Home Access: Stairs to enter Entrance Stairs-Rails: None Entrance Stairs-Number of Steps: 2 Home Layout: One level Home Equipment: Environmental consultant - 2 wheels;Cane - single point      Prior Function Level of Independence: Independent;Independent with assistive device(s)               Hand Dominance   Dominant Hand: Right    Extremity/Trunk Assessment   Upper Extremity Assessment: Overall WFL for tasks assessed           Lower Extremity Assessment: LLE deficits/detail   LLE Deficits / Details: Hip strength 2+/5 with AAROM at hip to 70 flex and 15 abd  Cervical / Trunk Assessment: Normal  Communication   Communication: No difficulties  Cognition Arousal/Alertness: Awake/alert Behavior During Therapy: WFL for tasks assessed/performed Overall Cognitive Status: Within Functional Limits for tasks assessed                      General Comments      Exercises Total Joint Exercises Ankle Circles/Pumps: AROM;15 reps;Both;Supine Quad Sets: AROM;Both;10 reps;Supine Heel Slides: AAROM;Left;15 reps;Supine Hip ABduction/ADduction: AAROM;Left;10 reps;Supine      Assessment/Plan    PT Assessment Patient needs continued PT services  PT Diagnosis Difficulty walking   PT Problem List Decreased strength;Decreased range of motion;Decreased activity  tolerance;Decreased mobility;Decreased knowledge of use of DME;Pain  PT Treatment Interventions DME instruction;Gait training;Stair training;Functional mobility training;Therapeutic activities;Therapeutic exercise;Patient/family education   PT Goals (Current goals can be found in the Care Plan section) Acute Rehab PT Goals Patient Stated Goal: Resume previous lifestyle with decreased pain PT Goal Formulation: With patient Time For Goal Achievement:  10/08/14 Potential to Achieve Goals: Good    Frequency 7X/week   Barriers to discharge        Co-evaluation               End of Session Equipment Utilized During Treatment: Gait belt Activity Tolerance: Patient tolerated treatment well Patient left: in chair;with call bell/phone within reach Nurse Communication: Mobility status         Time: 2876-8115 PT Time Calculation (min): 32 min   Charges:   PT Evaluation $Initial PT Evaluation Tier I: 1 Procedure PT Treatments $Gait Training: 8-22 mins $Therapeutic Exercise: 8-22 mins   PT G Codes:          Dajane Valli 10/01/2014, 5:30 PM

## 2014-10-01 NOTE — Progress Notes (Signed)
Utilization review completed.  

## 2014-10-01 NOTE — Brief Op Note (Signed)
10/01/2014  11:53 AM  PATIENT:  Ashley Shepherd  58 y.o. female  PRE-OPERATIVE DIAGNOSIS:  Left hip avascular necrosis  POST-OPERATIVE DIAGNOSIS:  Left hip avascular necrosis  PROCEDURE:  Procedure(s): LEFT TOTAL HIP ARTHROPLASTY ANTERIOR APPROACH (Left)  SURGEON:  Surgeon(s) and Role:    * Mcarthur Rossetti, MD - Primary  PHYSICIAN ASSISTANT: Benita Stabile, PA-C  ANESTHESIA:   spinal  EBL:  Total I/O In: 1000 [I.V.:1000] Out: 300 [Urine:100; Blood:200]  BLOOD ADMINISTERED:none  DRAINS: none   LOCAL MEDICATIONS USED:  NONE  SPECIMEN:  No Specimen  DISPOSITION OF SPECIMEN:  N/A  COUNTS:  YES  TOURNIQUET:  * No tourniquets in log *  DICTATION: .Other Dictation: Dictation Number [id number]  PLAN OF CARE: Admit to inpatient   PATIENT DISPOSITION:  PACU - hemodynamically stable.   Delay start of Pharmacological VTE agent (>24hrs) due to surgical blood loss or risk of bleeding: no

## 2014-10-01 NOTE — Anesthesia Postprocedure Evaluation (Signed)
Anesthesia Post Note  Patient: Ashley Shepherd  Procedure(s) Performed: Procedure(s) (LRB): LEFT TOTAL HIP ARTHROPLASTY ANTERIOR APPROACH (Left)  Anesthesia type: Spinal  Patient location: PACU  Post pain: Pain level controlled  Post assessment: Post-op Vital signs reviewed  Last Vitals: BP 102/74  Pulse 64  Temp(Src) 36.7 C (Oral)  Resp 18  Ht 5' 10.5" (1.791 m)  Wt 182 lb (82.555 kg)  BMI 25.74 kg/m2  SpO2 100%  Post vital signs: Reviewed  Level of consciousness: sedated  Complications: No apparent anesthesia complications

## 2014-10-01 NOTE — Plan of Care (Signed)
Problem: Consults Goal: Diagnosis- Total Joint Replacement Primary Total Hip     

## 2014-10-02 LAB — BASIC METABOLIC PANEL
Anion gap: 14 (ref 5–15)
BUN: 14 mg/dL (ref 6–23)
CALCIUM: 8.9 mg/dL (ref 8.4–10.5)
CO2: 23 meq/L (ref 19–32)
Chloride: 106 mEq/L (ref 96–112)
Creatinine, Ser: 1.2 mg/dL — ABNORMAL HIGH (ref 0.50–1.10)
GFR calc Af Amer: 57 mL/min — ABNORMAL LOW (ref >90)
GFR, EST NON AFRICAN AMERICAN: 49 mL/min — AB (ref >90)
GLUCOSE: 156 mg/dL — AB (ref 70–99)
Potassium: 4.8 mEq/L (ref 3.7–5.3)
SODIUM: 143 meq/L (ref 137–147)

## 2014-10-02 LAB — CBC
HCT: 33.5 % — ABNORMAL LOW (ref 36.0–46.0)
HEMOGLOBIN: 11.3 g/dL — AB (ref 12.0–15.0)
MCH: 31.4 pg (ref 26.0–34.0)
MCHC: 33.7 g/dL (ref 30.0–36.0)
MCV: 93.1 fL (ref 78.0–100.0)
Platelets: 173 10*3/uL (ref 150–400)
RBC: 3.6 MIL/uL — AB (ref 3.87–5.11)
RDW: 15 % (ref 11.5–15.5)
WBC: 9.6 10*3/uL (ref 4.0–10.5)

## 2014-10-02 NOTE — Progress Notes (Signed)
Physical Therapy Treatment Patient Details Name: Ashley Shepherd MRN: [id number] DOB: 04/26/1956 Today's Date: 10-19-14    History of Present Illness L THR    PT Comments    Progressing well and very motivated  Follow Up Recommendations  Home health PT     Equipment Recommendations  None recommended by PT    Recommendations for Other Services OT consult     Precautions / Restrictions Precautions Precautions: Fall Restrictions Weight Bearing Restrictions: No Other Position/Activity Restrictions: WBAT    Mobility  Bed Mobility Overal bed mobility: Needs Assistance Bed Mobility: Sit to Supine       Sit to supine: Min assist   General bed mobility comments: cues for sequence and use of R LE to self assist  Transfers Overall transfer level: Needs assistance Equipment used: Rolling walker (2 wheeled) Transfers: Sit to/from Stand Sit to Stand: Min assist         General transfer comment: cues for LE management and use of UEs to self assist  Ambulation/Gait Ambulation/Gait assistance: Min assist Ambulation Distance (Feet): 150 Feet (and 20' to bathroom) Assistive device: Rolling walker (2 wheeled) Gait Pattern/deviations: Step-to pattern Gait velocity: decr   General Gait Details: cues for posture, position from RW, initial sequence   Stairs            Wheelchair Mobility    Modified Rankin (Stroke Patients Only)       Balance                                    Cognition Arousal/Alertness: Awake/alert Behavior During Therapy: WFL for tasks assessed/performed Overall Cognitive Status: Within Functional Limits for tasks assessed                      Exercises Total Joint Exercises Ankle Circles/Pumps: AROM;15 reps;Both;Supine Quad Sets: AROM;Both;10 reps;Supine Heel Slides: AAROM;Left;Supine;20 reps Hip ABduction/ADduction: AAROM;Left;Supine;15 reps    General Comments        Pertinent Vitals/Pain Pain  Assessment: 0-10 Pain Score: 5  Pain Location: L hip Pain Descriptors / Indicators: Aching Pain Intervention(s): Limited activity within patient's tolerance;Monitored during session;Ice applied;Premedicated before session    Home Living                      Prior Function            PT Goals (current goals can now be found in the care plan section) Acute Rehab PT Goals Patient Stated Goal: Resume previous lifestyle with decreased pain PT Goal Formulation: With patient Time For Goal Achievement: 10/08/14 Potential to Achieve Goals: Good Progress towards PT goals: Progressing toward goals    Frequency  7X/week    PT Plan Current plan remains appropriate    Co-evaluation             End of Session Equipment Utilized During Treatment: Gait belt Activity Tolerance: Patient tolerated treatment well Patient left: in bed;with call bell/phone within reach     Time: 1135-1220 PT Time Calculation (min): 45 min  Charges:  $Gait Training: 8-22 mins $Therapeutic Exercise: 8-22 mins $Therapeutic Activity: 8-22 mins                    G Codes:      Ashley Shepherd 19-Oct-2014, 1:36 PM

## 2014-10-02 NOTE — Progress Notes (Signed)
Physical Therapy Treatment Patient Details Name: Ashley Shepherd MRN: [id number] DOB: February 24, 1956 Today's Date: 2014/10/09    History of Present Illness L THR    PT Comments    Pt motivated but ltd this am by pain.  Will follow up once medicated.  Follow Up Recommendations  Home health PT     Equipment Recommendations  None recommended by PT    Recommendations for Other Services OT consult     Precautions / Restrictions Precautions Precautions: Fall Restrictions Weight Bearing Restrictions: No Other Position/Activity Restrictions: WBAT    Mobility  Bed Mobility                  Transfers Overall transfer level: Needs assistance Equipment used: Rolling walker (2 wheeled) Transfers: Sit to/from Stand Sit to Stand: Min assist;Mod assist         General transfer comment: cues for LE management and use of UEs to self assist  Ambulation/Gait Ambulation/Gait assistance: Min assist Ambulation Distance (Feet): 69 Feet Assistive device: Rolling walker (2 wheeled) Gait Pattern/deviations: Step-to pattern;Shuffle;Trunk flexed Gait velocity: decr   General Gait Details: cues for posture, position from RW, initial sequence   Stairs            Wheelchair Mobility    Modified Rankin (Stroke Patients Only)       Balance                                    Cognition Arousal/Alertness: Awake/alert Behavior During Therapy: WFL for tasks assessed/performed Overall Cognitive Status: Within Functional Limits for tasks assessed                      Exercises      General Comments        Pertinent Vitals/Pain Pain Assessment: 0-10 Pain Score: 7  Pain Location: L hip Pain Descriptors / Indicators: Aching Pain Intervention(s): Limited activity within patient's tolerance;Monitored during session;Ice applied;Patient requesting pain meds-RN notified    Home Living                      Prior Function            PT  Goals (current goals can now be found in the care plan section) Acute Rehab PT Goals Patient Stated Goal: Resume previous lifestyle with decreased pain PT Goal Formulation: With patient Time For Goal Achievement: 10/08/14 Potential to Achieve Goals: Good Progress towards PT goals: Progressing toward goals    Frequency  7X/week    PT Plan Current plan remains appropriate    Co-evaluation             End of Session Equipment Utilized During Treatment: Gait belt Activity Tolerance: Patient tolerated treatment well Patient left: in chair;with call bell/phone within reach     Time: 0955-1016 PT Time Calculation (min): 21 min  Charges:  $Gait Training: 8-22 mins                    G Codes:      Ashley Shepherd October 09, 2014, 12:30 PM

## 2014-10-02 NOTE — Progress Notes (Signed)
Subjective: 1 Day Post-Op Procedure(s) (LRB): LEFT TOTAL HIP ARTHROPLASTY ANTERIOR APPROACH (Left) Patient reports pain as 4 on 0-10 scale.  Better than her pain yesterday. Ambulated in hall yesterday.   Objective: Vital signs in last 24 hours: Temp:  [97.5 F (36.4 C)-98.4 F (36.9 C)] 97.9 F (36.6 C) (10/10 0542) Pulse Rate:  [54-68] 60 (10/10 0542) Resp:  [13-19] 18 (10/10 0542) BP: (102-131)/(58-74) 128/64 mmHg (10/10 0542) SpO2:  [98 %-100 %] 100 % (10/10 0542) Weight:  [82.555 kg (182 lb)] 82.555 kg (182 lb) (10/09 1400)  Intake/Output from previous day: 10/09 0701 - 10/10 0700 In: 3469.2 [I.V.:3114.2; IV Piggyback:355] Out: 1610 [Urine:1205; Blood:200] Intake/Output this shift: Total I/O In: -  Out: 100 [Urine:100]   Recent Labs  10/02/14 0440  HGB 11.3*    Recent Labs  10/02/14 0440  WBC 9.6  RBC 3.60*  HCT 33.5*  PLT 173    Recent Labs  10/02/14 0440  NA 143  K 4.8  CL 106  CO2 23  BUN 14  CREATININE 1.20*  GLUCOSE 156*  CALCIUM 8.9   No results found for this basename: LABPT, INR,  in the last 72 hours  Neurologically intact trace drainage on dressing sealed from O.R.  Assessment/Plan: 1 Day Post-Op Procedure(s) (LRB): LEFT TOTAL HIP ARTHROPLASTY ANTERIOR APPROACH (Left) Up with therapy  Possible home Sunday or Monday.  Hgb stable  Sabri Teal C 10/02/2014, 8:25 AM

## 2014-10-02 NOTE — Evaluation (Signed)
Occupational Therapy Evaluation Patient Details Name: Ashley Shepherd MRN: [id number] DOB: [birth year] Today's Date: 10/02/2014    History of Present Illness L direct THR   Clinical Impression   Pt doing well but does report feeling some stiffness in L hip with being up. She requires min assist for functional transfers on and off 3in1. Will benefit from continued OT to progress ADL independence and work toward less reliance on UEs for support in order to increase independence with clothing management, grooming, etc.     Follow Up Recommendations  No OT follow up;Supervision/Assistance - 24 hour    Equipment Recommendations  None recommended by OT    Recommendations for Other Services       Precautions / Restrictions Precautions Precautions: Fall Restrictions Weight Bearing Restrictions: No Other Position/Activity Restrictions: WBAT      Mobility Bed Mobility Overal bed mobility: Needs Assistance Bed Mobility: Supine to Sit     Supine to sit: Min assist    General bed mobility comments: assist for positioning R LE under L to help scoot around to EOB  Transfers Overall transfer level: Needs assistance Equipment used: Rolling walker (2 wheeled) Transfers: Sit to/from Stand Sit to Stand: Min assist         General transfer comment: verbal cues hand placement and LE management.     Balance                                            ADL Overall ADL's : Needs assistance/impaired Eating/Feeding: Independent;Sitting   Grooming: Wash/dry hands;Minimal assistance;Standing   Upper Body Bathing: Set up;Sitting   Lower Body Bathing: Moderate assistance;Sit to/from stand   Upper Body Dressing : Set up;Sitting   Lower Body Dressing: Maximal assistance;Sit to/from stand   Toilet Transfer: Minimal assistance;Ambulation;BSC;RW   Toileting- Clothing Manipulation and Hygiene: Minimal assistance;Sit to/from stand         General ADL Comments: Pt  requiring one hand hold on walker even with washing hands at the sink currently. She has a tub and a tubseat but states she will start out with sponge bathing. Will need to introduce AE option versus family assisting. Not addressed this visit. Pt is motivated and doing well but does need min assist for functional transfers and min cues for walker distance to self and hand placement/LE management.      Vision                     Perception     Praxis      Pertinent Vitals/Pain Pain Assessment: 0-10 Pain Score: 6  Pain Location: L hip Pain Descriptors / Indicators: Aching Pain Intervention(s): Patient requesting pain meds-RN notified;Repositioned;Ice applied     Hand Dominance Right   Extremity/Trunk Assessment Upper Extremity Assessment Upper Extremity Assessment: Overall WFL for tasks assessed           Communication Communication Communication: No difficulties   Cognition Arousal/Alertness: Awake/alert Behavior During Therapy: WFL for tasks assessed/performed Overall Cognitive Status: Within Functional Limits for tasks assessed                     General Comments       Exercises Exercises: Total Joint     Shoulder Instructions      Home Living Family/patient expects to be discharged to:: Private residence Living Arrangements: Alone Available Help at  Discharge: Family Type of Home: House Home Access: Stairs to enter [employer] of Steps: 2 Entrance Stairs-Rails: None Home Layout: One level     Bathroom Shower/Tub: Teacher, early years/pre: Standard     Home Equipment: Environmental consultant - 2 wheels;Cane - single point;Bedside commode          Prior Functioning/Environment Level of Independence: Independent;Independent with assistive device(s)             OT Diagnosis: Generalized weakness   OT Problem List: Decreased strength;Decreased knowledge of use of DME or AE   OT Treatment/Interventions: Self-care/ADL  training;Patient/family education;Therapeutic activities;DME and/or AE instruction    OT Goals(Current goals can be found in the care plan section) Acute Rehab OT Goals Patient Stated Goal: Resume previous lifestyle with decreased pain OT Goal Formulation: With patient Time For Goal Achievement: 10/09/14 Potential to Achieve Goals: Good  OT Frequency: Min 2X/week   Barriers to D/C:            Co-evaluation              End of Session Equipment Utilized During Treatment: Gait belt;Rolling walker  Activity Tolerance: Patient tolerated treatment well Patient left: in chair;with call bell/phone within reach;with nursing/sitter in room   Time: 1345-1410 OT Time Calculation (min): 25 min Charges:  OT General Charges $OT Visit: 1 Procedure OT Evaluation $Initial OT Evaluation Tier I: 1 Procedure OT Treatments $Therapeutic Activity: 8-22 mins G-Codes:    Jules Schick [phone] 10/02/2014, 3:02 PM

## 2014-10-03 LAB — CBC
HEMATOCRIT: 29.2 % — AB (ref 36.0–46.0)
HEMOGLOBIN: 8.4 g/dL — AB (ref 12.0–15.0)
MCH: 26.3 pg (ref 26.0–34.0)
MCHC: 28.8 g/dL — AB (ref 30.0–36.0)
MCV: 91.3 fL (ref 78.0–100.0)
Platelets: 153 10*3/uL (ref 150–400)
RBC: 3.2 MIL/uL — ABNORMAL LOW (ref 3.87–5.11)
RDW: 14.9 % (ref 11.5–15.5)
WBC: 5.1 10*3/uL (ref 4.0–10.5)

## 2014-10-03 MED ORDER — FERROUS SULFATE 325 (65 FE) MG PO TABS: 325.0000 mg | ORAL_TABLET | Freq: Three times a day (TID) | ORAL | Status: DC

## 2014-10-03 MED ORDER — METHOCARBAMOL 500 MG PO TABS: 500.0000 mg | ORAL_TABLET | Freq: Four times a day (QID) | ORAL | Status: DC | PRN

## 2014-10-03 MED ORDER — OXYCODONE-ACETAMINOPHEN 5-325 MG PO TABS: 1.0000 | ORAL_TABLET | ORAL | Status: DC | PRN

## 2014-10-03 MED ORDER — ASPIRIN 325 MG PO TBEC: 325.0000 mg | DELAYED_RELEASE_TABLET | Freq: Two times a day (BID) | ORAL | Status: DC

## 2014-10-03 NOTE — Progress Notes (Signed)
Physical Therapy Treatment Patient Details Name: Ashley Shepherd MRN: [id number] DOB: 1956/01/29 Today's Date: 10/03/2014    History of Present Illness L direct THR    PT Comments    Reviewed car transfers and stairs with pt.  Pt eager for d/c.  Follow Up Recommendations  Home health PT     Equipment Recommendations  None recommended by PT    Recommendations for Other Services OT consult     Precautions / Restrictions Precautions Precautions: Fall Restrictions Weight Bearing Restrictions: No Other Position/Activity Restrictions: WBAT    Mobility  Bed Mobility Overal bed mobility: Needs Assistance Bed Mobility: Supine to Sit     Supine to sit: Min guard;Supervision     General bed mobility comments: Pt sitting up in recliner when OT arrived.   Transfers Overall transfer level: Needs assistance Equipment used: Rolling walker (2 wheeled) Transfers: Sit to/from Stand Sit to Stand: Min assist         General transfer comment: Min (A) for balance and to stabilize RW. Good hand placement.  Ambulation/Gait Ambulation/Gait assistance: Min guard;Supervision Ambulation Distance (Feet): 150 Feet Assistive device: Rolling walker (2 wheeled) Gait Pattern/deviations: Step-to pattern;Step-through pattern;Decreased step length - right;Decreased step length - left;Shuffle;Trunk flexed Gait velocity: decr   General Gait Details: cues for posture, position from RW, initial sequence   Stairs Stairs: Yes Stairs assistance: Min guard Stair Management: Two rails;Step to pattern;Forwards Number of Stairs: 2 General stair comments: cues for sequence and foot placement  Wheelchair Mobility    Modified Rankin (Stroke Patients Only)       Balance                                    Cognition Arousal/Alertness: Awake/alert Behavior During Therapy: WFL for tasks assessed/performed Overall Cognitive Status: Within Functional Limits for tasks assessed                       Exercises Total Joint Exercises Ankle Circles/Pumps: AROM;15 reps;Both;Supine Quad Sets: AROM;Both;10 reps;Supine Gluteal Sets: AROM;Both;10 reps;Supine Heel Slides: AAROM;Left;Supine;20 reps Hip ABduction/ADduction: AAROM;Left;Supine;15 reps Long Arc Quad: AROM;Left;Strengthening;10 reps;Seated    General Comments        Pertinent Vitals/Pain Pain Assessment: No/denies pain Pain Score: 2  Pain Location: L hip Pain Descriptors / Indicators: Aching Pain Intervention(s): Limited activity within patient's tolerance;Monitored during session;Premedicated before session;Ice applied    Home Living                      Prior Function            PT Goals (current goals can now be found in the care plan section) Acute Rehab PT Goals Patient Stated Goal: To go home today and be independent soon PT Goal Formulation: With patient Time For Goal Achievement: 10/08/14 Potential to Achieve Goals: Good Progress towards PT goals: Progressing toward goals    Frequency  7X/week    PT Plan Current plan remains appropriate    Co-evaluation             End of Session Equipment Utilized During Treatment: Gait belt Activity Tolerance: Patient tolerated treatment well Patient left: in chair;with call bell/phone within reach     Time: 0915-1000 PT Time Calculation (min): 45 min  Charges:  $Gait Training: 8-22 mins $Therapeutic Exercise: 8-22 mins $Therapeutic Activity: 8-22 mins  G Codes:      Ashley Shepherd 10/09/2014, 12:52 PM

## 2014-10-03 NOTE — Progress Notes (Signed)
Occupational Therapy Treatment Patient Details Name: Ashley Shepherd MRN: [id number] DOB: 1956/07/11 Today's Date: 10/03/2014    History of present illness L direct THR   OT comments  Pt seen today for AE training and functional mobility. Pt reports that she will have someone to assist her 24/7 with ADLs initially, but is interested in AE training. Pt will continue to benefit from acute OT to address strength and endurance during ADLs.    Follow Up Recommendations  No OT follow up;Supervision/Assistance - 24 hour    Equipment Recommendations  None recommended by OT    Recommendations for Other Services      Precautions / Restrictions Precautions Precautions: Fall Restrictions Weight Bearing Restrictions: No Other Position/Activity Restrictions: WBAT       Mobility Bed Mobility               General bed mobility comments: Pt sitting up in recliner when OT arrived.   Transfers Overall transfer level: Needs assistance Equipment used: Rolling walker (2 wheeled) Transfers: Sit to/from Stand Sit to Stand: Min assist         General transfer comment: Min (A) for balance and to stabilize RW. Good hand placement.        ADL Overall ADL's : Needs assistance/impaired                                       General ADL Comments: Educated pt on AE for LB ADLs with demonstration. Pt states that she will have someone staying with her to assist, however is interested in the long handled sponge. Educated pt that she can purchase one at most stores ([employer], [employer], dollar tree, etc) and pt reports that she plans to purchase for independence with LB bathing. Pt is motivated to be independent, however accepts that she will need assistance for a few weeks. Educated pt on fall prevention and energy conservation as well as use of reacher to pick up items she may drop. Pt was grateful for AE information. Pt has a 3N1 at home and educated on use beside bed for the  first few nights to prevent pt from rushing to get to the bathroom.                 Cognition  Arousal/Alertness: Awake/Alert Behavior During Therapy: WFL for tasks assessed/performed Overall Cognitive Status: Within Functional Limits for tasks assessed                                    Pertinent Vitals/ Pain       Pain Assessment: No/denies pain         Frequency Min 2X/week     Progress Toward Goals  OT Goals(current goals can now be found in the care plan section)  Progress towards OT goals: Progressing toward goals  Acute Rehab OT Goals Patient Stated Goal: To go home today and be independent soon OT Goal Formulation: With patient Time For Goal Achievement: 10/09/14 Potential to Achieve Goals: Good ADL Goals Pt Will Perform Grooming: with min guard assist;standing Pt Will Transfer to Toilet: with min guard assist;ambulating;bedside commode Pt Will Perform Toileting - Clothing Manipulation and hygiene: with min guard assist;sit to/from stand Additional ADL Goal #1: Pt will demonstrate versus verbalize independence with AE for LB self care if desired versus family assist.  Plan Discharge plan remains appropriate       End of Session Equipment Utilized During Treatment: Gait belt;Rolling walker (AE (hip kit))   Activity Tolerance Patient tolerated treatment well   Patient Left in chair;with call bell/phone within reach   Nurse Communication          Time: 1055-1106 OT Time Calculation (min): 11 min  Charges: OT General Charges $OT Visit: 1 Procedure OT Treatments $Self Care/Home Management : 8-22 mins  Villa Herb M 10/03/2014, 11:24 AM  Cyndie Chime, OTR/L Occupational Therapist [phone] (pager)

## 2014-10-03 NOTE — Discharge Summary (Signed)
Patient ID: Ashley Shepherd MRN: [id number] DOB/AGE: 1956/02/28 58 y.o.  Admit date: 10/01/2014 Discharge date: 10/03/2014  Admission Diagnoses:  Principal Problem:   Avascular necrosis of bone of left hip Active Problems:   Status post total replacement of left hip   Discharge Diagnoses:  Same  Past Medical History  Diagnosis Date  . HIV disease   . Cancer     7 years ago, cancer free now  . Avascular necrosis of hip     left  . Arthritis   . GERD (gastroesophageal reflux disease)   . History of kidney stones   . History of transfusion   . Anemia   . Difficulty sleeping   . Anxiety     Surgeries: Procedure(s): LEFT TOTAL HIP ARTHROPLASTY ANTERIOR APPROACH on 10/01/2014   Consultants:    Discharged Condition: Improved  Hospital Course: Ashley Shepherd is an 58 y.o. female who was admitted 10/01/2014 for operative treatment ofAvascular necrosis of bone of left hip. Patient has severe unremitting pain that affects sleep, daily activities, and work/hobbies. After pre-op clearance the patient was taken to the operating room on 10/01/2014 and underwent  Procedure(s): LEFT TOTAL HIP ARTHROPLASTY ANTERIOR APPROACH.    Patient was given perioperative antibiotics: Anti-infectives   Start     Dose/Rate Route Frequency Ordered Stop   10/01/14 1600  dolutegravir (TIVICAY) tablet 50 mg     50 mg Oral Daily 10/01/14 1419     10/01/14 1600  emtricitabine-tenofovir (TRUVADA) 200-300 MG per tablet 1 tablet     1 tablet Oral Daily 10/01/14 1419     10/01/14 1600  vancomycin (VANCOCIN) 1,250 mg in sodium chloride 0.9 % 250 mL IVPB     1,250 mg 166.7 mL/hr over 90 Minutes Intravenous Every 24 hours 10/01/14 1419 10/02/14 1906   10/01/14 1500  maraviroc (SELZENTRY) tablet 300 mg     300 mg Oral 2 times daily 10/01/14 1419     10/01/14 0820  ceFAZolin (ANCEF) IVPB 2 g/50 mL premix     2 g 100 mL/hr over 30 Minutes Intravenous On call to O.R. 10/01/14 0820 10/01/14 1051       Patient was  given sequential compression devices, early ambulation, and chemoprophylaxis to prevent DVT.  Patient benefited maximally from hospital stay and there were no complications.    Recent vital signs: Patient Vitals for the past 24 hrs:  BP Temp Temp src Pulse Resp SpO2  10/03/14 0524 121/71 mmHg 98.5 F (36.9 C) Oral 72 16 97 %  10/02/14 2120 130/69 mmHg 98.2 F (36.8 C) Oral 65 18 99 %  10/02/14 1500 128/59 mmHg 98.5 F (36.9 C) Oral 59 18 100 %  10/02/14 1045 102/56 mmHg 97.3 F (36.3 C) Axillary 59 18 100 %     Recent laboratory studies:  Recent Labs  10/02/14 0440 10/03/14 0540  WBC 9.6 5.1  HGB 11.3* 8.4*  HCT 33.5* 29.2*  PLT 173 153  NA 143  --   K 4.8  --   CL 106  --   CO2 23  --   BUN 14  --   CREATININE 1.20*  --   GLUCOSE 156*  --   CALCIUM 8.9  --      Discharge Medications:     Medication List    STOP taking these medications       aspirin 81 MG tablet  Replaced by:  aspirin 325 MG EC tablet      TAKE these medications  ALPRAZolam 1 MG tablet  Commonly known as:  XANAX  Take 1 mg by mouth at bedtime as needed for anxiety.     aspirin 325 MG EC tablet  Take 1 tablet (325 mg total) by mouth 2 (two) times daily after a meal.     emtricitabine-tenofovir 200-300 MG per tablet  Commonly known as:  TRUVADA  Take 1 tablet by mouth daily.     ferrous sulfate 325 (65 FE) MG tablet  Take 1 tablet (325 mg total) by mouth 3 (three) times daily with meals.     maraviroc 300 MG tablet  Commonly known as:  SELZENTRY  Take 300 mg by mouth 2 (two) times daily.     methocarbamol 500 MG tablet  Commonly known as:  ROBAXIN  Take 1 tablet (500 mg total) by mouth every 6 (six) hours as needed for muscle spasms.     omeprazole 40 MG capsule  Commonly known as:  PRILOSEC  Take 40 mg by mouth daily as needed (indigestion).     ondansetron 4 MG disintegrating tablet  Commonly known as:  ZOFRAN-ODT  Take 4 mg by mouth every 8 (eight) hours as needed for  nausea or vomiting.     oxyCODONE-acetaminophen 5-325 MG per tablet  Commonly known as:  ROXICET  Take 1-2 tablets by mouth every 4 (four) hours as needed.     TIVICAY PO  Take 1 tablet by mouth 2 (two) times daily.     TYLENOL ARTHRITIS PAIN 650 MG CR tablet  Generic drug:  acetaminophen  Take 1,300 mg by mouth once as needed for pain.        Diagnostic Studies: Dg Hip Complete Left  10/01/2014   CLINICAL DATA:  Left anterior approach Total hip replacement  EXAM: DG C-ARM 1-60 MIN - NRPT [facility]; LEFT HIP - COMPLETE 2+ VIEW  COMPARISON:  10/01/2014  FINDINGS: Left hip replacement in satisfactory position alignment. No immediate complication.  IMPRESSION: Satisfactory left hip replacement.   Electronically Signed   By: Franchot Gallo M.D.   On: 10/01/2014 13:29   Dg Pelvis Portable  10/01/2014   CLINICAL DATA:  Status post left total hip arthroplasty.  EXAM: PORTABLE PELVIS 1-2 VIEWS  COMPARISON:  Same day.  FINDINGS: Left femoral and acetabular components appear to be well situated. No fracture dislocation is noted. Postsurgical changes are seen in the soft tissues around the left hip.  IMPRESSION: Status post left total hip arthroplasty.   Electronically Signed   By: Sabino Dick M.D.   On: 10/01/2014 14:02   Dg Hip Portable 1 View Left  10/01/2014   CLINICAL DATA:  Osteoarthritis left hip.  Recent hip replacement.  EXAM: PORTABLE LEFT HIP - 1 VIEW  COMPARISON:  Earlier same day.  FINDINGS: The cross-table lateral view demonstrates evidence of patient's left total hip arthroplasty normally located and intact. Remainder the exam is unchanged.  IMPRESSION: Left total hip arthroplasty intact.   Electronically Signed   By: Marin Olp M.D.   On: 10/01/2014 14:01   Dg C-arm 1-60 Min-no Report  10/01/2014   CLINICAL DATA:  Left anterior approach Total hip replacement  EXAM: DG C-ARM 1-60 MIN - NRPT [facility]; LEFT HIP - COMPLETE 2+ VIEW  COMPARISON:  10/01/2014  FINDINGS: Left hip replacement in  satisfactory position alignment. No immediate complication.  IMPRESSION: Satisfactory left hip replacement.   Electronically Signed   By: Franchot Gallo M.D.   On: 10/01/2014 13:29    Disposition: to home  Discharge Instructions   Call MD / Call 911    Complete by:  As directed   If you experience chest pain or shortness of breath, CALL 911 and be transported to the hospital emergency room.  If you develope a fever above 101 F, pus (white drainage) or increased drainage or redness at the wound, or calf pain, call your surgeon's office.     Constipation Prevention    Complete by:  As directed   Drink plenty of fluids.  Prune juice may be helpful.  You may use a stool softener, such as Colace (over the counter) 100 mg twice a day.  Use MiraLax (over the counter) for constipation as needed.     Diet - low sodium heart healthy    Complete by:  As directed      Discharge instructions    Complete by:  As directed   Increase your activities as comfort allows. Ice and elevation for swelling. You can get your current dressing wet in the shower daily. Leave your current dressing in place until your outpatient follow-up. Do take and over-the-counter stool softener daily to twice daily.     Discharge patient    Complete by:  As directed      Increase activity slowly as tolerated    Complete by:  As directed            Follow-up Information   Follow up with Mcarthur Rossetti, MD In 2 weeks.   Specialty:  Orthopedic Surgery   Contact information:   [place] [address] [phone]        Signed: Mcarthur Rossetti 10/03/2014, 8:43 AM

## 2014-10-03 NOTE — Progress Notes (Signed)
CARE MANAGEMENT NOTE 10/03/2014  Patient:  Ashley Shepherd, Ashley Shepherd   Account Number:  [id number]  Date Initiated:  10/01/2014  Documentation initiated by:  Sunday Spillers  Subjective/Objective Assessment:   58 yo female admitted s/p hip surgery. PTA lived at home.     Action/Plan:   Home when stable, arranged [place] PTA, confirmed with patient   Anticipated DC Date:  10/04/2014   Anticipated DC Plan:  [place]  CM consult      [facility] Choice  HOME HEALTH   Choice offered to / List presented to:  C-1 Patient        [place] arranged  HH-2 PT      [place]   Status of service:  Completed, signed off Medicare Important Message given?  YES (If response is "NO", the following Medicare IM given date fields will be blank) Date Medicare IM given:  10/03/2014 Medicare IM given by:  [facility] Date Additional Medicare IM given:   Additional Medicare IM given by:    Discharge Disposition:  [place]  Per UR Regulation:  Reviewed for med. necessity/level of care/duration of stay  If discussed at [place] of Stay Meetings, dates discussed:    Comments:  10/03/2014 0915 NCM spoke to pt and offered choice for [facility]. Pt agreeable to [facility] with [facility]. States she has RW and 3n1 at home. Jonnie Finner RN CCM Case Mgmt phone [phone]

## 2014-10-03 NOTE — Progress Notes (Signed)
Subjective: 2 Days Post-Op Procedure(s) (LRB): LEFT TOTAL HIP ARTHROPLASTY ANTERIOR APPROACH (Left) Patient reports pain as moderate.  Asymptomatic acute blood loss anemia.  Progressing well with therapy.  Wants to go home today.  Objective: Vital signs in last 24 hours: Temp:  [97.3 F (36.3 C)-98.5 F (36.9 C)] 98.5 F (36.9 C) (10/11 0524) Pulse Rate:  [59-72] 72 (10/11 0524) Resp:  [16-18] 16 (10/11 0524) BP: (102-130)/(56-71) 121/71 mmHg (10/11 0524) SpO2:  [97 %-100 %] 97 % (10/11 0524)  Intake/Output from previous day: 10/10 0701 - 10/11 0700 In: 2788.8 [P.O.:1680; I.V.:1108.8] Out: 1200 [Urine:1200] Intake/Output this shift: Total I/O In: 240 [P.O.:240] Out: 150 [Urine:150]   Recent Labs  10/02/14 0440 10/03/14 0540  HGB 11.3* 8.4*    Recent Labs  10/02/14 0440 10/03/14 0540  WBC 9.6 5.1  RBC 3.60* 3.20*  HCT 33.5* 29.2*  PLT 173 153    Recent Labs  10/02/14 0440  NA 143  K 4.8  CL 106  CO2 23  BUN 14  CREATININE 1.20*  GLUCOSE 156*  CALCIUM 8.9   No results found for this basename: LABPT, INR,  in the last 72 hours  Sensation intact distally Intact pulses distally Dorsiflexion/Plantar flexion intact Incision: dressing C/D/I  Assessment/Plan: 2 Days Post-Op Procedure(s) (LRB): LEFT TOTAL HIP ARTHROPLASTY ANTERIOR APPROACH (Left) Up with therapy Discharge home with home health today.  Moritz Lever Y 10/03/2014, 8:39 AM

## 2014-10-04 ENCOUNTER — Encounter (HOSPITAL_COMMUNITY): Payer: Self-pay | Admitting: Orthopaedic Surgery

## 2016-01-23 ENCOUNTER — Other Ambulatory Visit (HOSPITAL_COMMUNITY): Payer: Self-pay | Admitting: Orthopaedic Surgery

## 2016-01-23 ENCOUNTER — Encounter (HOSPITAL_COMMUNITY): Payer: Self-pay | Admitting: *Deleted

## 2016-01-23 NOTE — Progress Notes (Signed)
Pt denies SOB, chest pain, and being under the care of a cardiologist. Pt denies having a stress test, echo and cardiac cath. Pt denies having a chest x [place] and EKG within the last year. Pt made aware to stop taking Aspirin, otc vitamins, fish oil and herbal medications. Do not take any NSAIDs ie: Ibuprofen, Advil, Naproxen or any medication containing Aspirin. Pt verbalized understanding of all pre-op instructions.

## 2016-01-24 ENCOUNTER — Encounter (HOSPITAL_COMMUNITY): Payer: Self-pay | Admitting: *Deleted

## 2016-01-24 ENCOUNTER — Inpatient Hospital Stay (HOSPITAL_COMMUNITY): Payer: Medicare Other

## 2016-01-24 ENCOUNTER — Encounter (HOSPITAL_COMMUNITY)
Admission: RE | Disposition: A | Discharge status: Home / Self Care | Payer: Self-pay | Source: Ambulatory Visit | Attending: Orthopaedic Surgery

## 2016-01-24 ENCOUNTER — Inpatient Hospital Stay (HOSPITAL_COMMUNITY): Payer: Medicare Other | Admitting: Certified Registered Nurse Anesthetist

## 2016-01-24 ENCOUNTER — Inpatient Hospital Stay (HOSPITAL_COMMUNITY)
Admission: RE | Admit: 2016-01-24 | Discharge: 2016-01-28 | DRG: 463 | Disposition: A | Discharge status: Home Health | Payer: Medicare Other | Source: Ambulatory Visit | Attending: Orthopaedic Surgery | Admitting: Orthopaedic Surgery

## 2016-01-24 DIAGNOSIS — K219 Gastro-esophageal reflux disease without esophagitis: Secondary | ICD-10-CM | POA: Diagnosis present

## 2016-01-24 DIAGNOSIS — Z91041 Radiographic dye allergy status: Secondary | ICD-10-CM

## 2016-01-24 DIAGNOSIS — T8452XA Infection and inflammatory reaction due to internal left hip prosthesis, initial encounter: Secondary | ICD-10-CM | POA: Diagnosis present

## 2016-01-24 DIAGNOSIS — T8459XA Infection and inflammatory reaction due to other internal joint prosthesis, initial encounter: Secondary | ICD-10-CM

## 2016-01-24 DIAGNOSIS — D62 Acute posthemorrhagic anemia: Secondary | ICD-10-CM | POA: Diagnosis not present

## 2016-01-24 DIAGNOSIS — N183 Chronic kidney disease, stage 3 unspecified: Secondary | ICD-10-CM | POA: Insufficient documentation

## 2016-01-24 DIAGNOSIS — B20 Human immunodeficiency virus [HIV] disease: Secondary | ICD-10-CM | POA: Diagnosis present

## 2016-01-24 DIAGNOSIS — Z96649 Presence of unspecified artificial hip joint: Secondary | ICD-10-CM

## 2016-01-24 DIAGNOSIS — M00852 Arthritis due to other bacteria, left hip: Secondary | ICD-10-CM | POA: Diagnosis not present

## 2016-01-24 DIAGNOSIS — B951 Streptococcus, group B, as the cause of diseases classified elsewhere: Secondary | ICD-10-CM | POA: Diagnosis not present

## 2016-01-24 DIAGNOSIS — T8452XD Infection and inflammatory reaction due to internal left hip prosthesis, subsequent encounter: Secondary | ICD-10-CM | POA: Diagnosis not present

## 2016-01-24 DIAGNOSIS — Y838 Other surgical procedures as the cause of abnormal reaction of the patient, or of later complication, without mention of misadventure at the time of the procedure: Secondary | ICD-10-CM | POA: Diagnosis not present

## 2016-01-24 DIAGNOSIS — Z885 Allergy status to narcotic agent status: Secondary | ICD-10-CM

## 2016-01-24 DIAGNOSIS — B9689 Other specified bacterial agents as the cause of diseases classified elsewhere: Secondary | ICD-10-CM | POA: Diagnosis not present

## 2016-01-24 DIAGNOSIS — Z882 Allergy status to sulfonamides status: Secondary | ICD-10-CM

## 2016-01-24 DIAGNOSIS — F17210 Nicotine dependence, cigarettes, uncomplicated: Secondary | ICD-10-CM | POA: Diagnosis present

## 2016-01-24 DIAGNOSIS — T84031A Mechanical loosening of internal left hip prosthetic joint, initial encounter: Secondary | ICD-10-CM | POA: Diagnosis present

## 2016-01-24 HISTORY — PX: INCISION AND DRAINAGE HIP: SHX1801

## 2016-01-24 HISTORY — DX: Calculus of kidney: N20.0

## 2016-01-24 HISTORY — DX: Major depressive disorder, single episode, unspecified: F32.9

## 2016-01-24 HISTORY — PX: EXCISIONAL TOTAL HIP ARTHROPLASTY WITH ANTIBIOTIC SPACERS: SHX5826

## 2016-01-24 HISTORY — DX: Pneumonia, unspecified organism: J18.9

## 2016-01-24 HISTORY — DX: Depression, unspecified: F32.A

## 2016-01-24 LAB — GRAM STAIN

## 2016-01-24 SURGERY — IRRIGATION AND DEBRIDEMENT HIP
Anesthesia: General | Site: Hip | Laterality: Left

## 2016-01-24 MED ORDER — OXYCODONE HCL 5 MG PO TABS
5.0000 mg | ORAL_TABLET | ORAL | Status: DC | PRN
  Administered 2016-01-24 – 2016-01-26 (×7): 15 mg via ORAL
  Administered 2016-01-26: 10 mg via ORAL
  Administered 2016-01-26 (×3): 15 mg via ORAL
  Administered 2016-01-27: 10 mg via ORAL
  Administered 2016-01-27: 15 mg via ORAL
  Filled 2016-01-24 (×2): qty 3
  Filled 2016-01-24: qty 2
  Filled 2016-01-24 (×4): qty 3
  Filled 2016-01-24: qty 2
  Filled 2016-01-24 (×4): qty 3

## 2016-01-24 MED ORDER — METOCLOPRAMIDE HCL 5 MG PO TABS
5.0000 mg | ORAL_TABLET | Freq: Three times a day (TID) | ORAL | Status: DC | PRN
Start: 2016-01-24 — End: 2016-01-28

## 2016-01-24 MED ORDER — NEOSTIGMINE METHYLSULFATE 10 MG/10ML IV SOLN
INTRAVENOUS | Status: DC | PRN
  Administered 2016-01-24: 3 mg via INTRAVENOUS

## 2016-01-24 MED ORDER — DEXAMETHASONE SODIUM PHOSPHATE 10 MG/ML IJ SOLN
INTRAMUSCULAR | Status: DC | PRN
  Administered 2016-01-24: 10 mg via INTRAVENOUS

## 2016-01-24 MED ORDER — ONDANSETRON HCL 4 MG/2ML IJ SOLN
INTRAMUSCULAR | Status: AC
  Filled 2016-01-24: qty 2

## 2016-01-24 MED ORDER — PROMETHAZINE HCL 25 MG/ML IJ SOLN: 6.2500 mg | INTRAMUSCULAR | Status: DC | PRN

## 2016-01-24 MED ORDER — CITALOPRAM HYDROBROMIDE 20 MG PO TABS
20.0000 mg | ORAL_TABLET | Freq: Every day | ORAL | Status: DC
  Administered 2016-01-25 – 2016-01-28 (×4): 20 mg via ORAL
  Filled 2016-01-24 (×4): qty 1

## 2016-01-24 MED ORDER — FENTANYL CITRATE (PF) 100 MCG/2ML IJ SOLN
INTRAMUSCULAR | Status: AC
  Filled 2016-01-24: qty 2

## 2016-01-24 MED ORDER — GLYCOPYRROLATE 0.2 MG/ML IJ SOLN
INTRAMUSCULAR | Status: DC | PRN
  Administered 2016-01-24: 0.4 mg via INTRAVENOUS

## 2016-01-24 MED ORDER — TOBRAMYCIN SULFATE 1.2 G IJ SOLR
INTRAMUSCULAR | Status: AC
  Filled 2016-01-24: qty 2.4

## 2016-01-24 MED ORDER — MENTHOL 3 MG MT LOZG
1.0000 | LOZENGE | OROMUCOSAL | Status: DC | PRN
Start: 2016-01-24 — End: 2016-01-28

## 2016-01-24 MED ORDER — LIDOCAINE HCL 4 % EX SOLN
CUTANEOUS | Status: DC | PRN
  Administered 2016-01-24: 3 mL via TOPICAL

## 2016-01-24 MED ORDER — EMTRICITABINE-TENOFOVIR AF 200-25 MG PO TABS
1.0000 | ORAL_TABLET | Freq: Every day | ORAL | Status: DC
Start: 2016-01-25 — End: 2016-01-28
  Administered 2016-01-25 – 2016-01-28 (×4): 1 via ORAL
  Filled 2016-01-24 (×4): qty 1

## 2016-01-24 MED ORDER — DIPHENHYDRAMINE HCL 50 MG/ML IJ SOLN
INTRAMUSCULAR | Status: AC
  Filled 2016-01-24: qty 1

## 2016-01-24 MED ORDER — VANCOMYCIN HCL IN DEXTROSE 1-5 GM/200ML-% IV SOLN
1000.0000 mg | Freq: Three times a day (TID) | INTRAVENOUS | Status: DC
Start: 2016-01-25 — End: 2016-01-25
  Administered 2016-01-25: 1000 mg via INTRAVENOUS
  Filled 2016-01-24 (×3): qty 200

## 2016-01-24 MED ORDER — FENTANYL CITRATE (PF) 250 MCG/5ML IJ SOLN
INTRAMUSCULAR | Status: AC
  Filled 2016-01-24: qty 5

## 2016-01-24 MED ORDER — FENTANYL CITRATE (PF) 100 MCG/2ML IJ SOLN
25.0000 ug | INTRAMUSCULAR | Status: DC | PRN
  Administered 2016-01-24 (×3): 50 ug via INTRAVENOUS

## 2016-01-24 MED ORDER — DIPHENHYDRAMINE HCL 50 MG/ML IJ SOLN
INTRAMUSCULAR | Status: DC | PRN
  Administered 2016-01-24: 12.5 mg via INTRAVENOUS

## 2016-01-24 MED ORDER — ASPIRIN EC 325 MG PO TBEC
325.0000 mg | DELAYED_RELEASE_TABLET | Freq: Two times a day (BID) | ORAL | Status: DC
  Administered 2016-01-25 – 2016-01-28 (×7): 325 mg via ORAL
  Filled 2016-01-24 (×7): qty 1

## 2016-01-24 MED ORDER — MIDAZOLAM HCL 5 MG/5ML IJ SOLN
INTRAMUSCULAR | Status: DC | PRN
  Administered 2016-01-24: 2 mg via INTRAVENOUS

## 2016-01-24 MED ORDER — MARAVIROC 300 MG PO TABS
600.0000 mg | ORAL_TABLET | Freq: Two times a day (BID) | ORAL | Status: DC
  Administered 2016-01-25: 600 mg via ORAL
  Filled 2016-01-24 (×6): qty 2

## 2016-01-24 MED ORDER — DEXTROSE 5 % IV SOLN
10.0000 mg | INTRAVENOUS | Status: DC | PRN
  Administered 2016-01-24: 10 ug/min via INTRAVENOUS

## 2016-01-24 MED ORDER — ROCURONIUM BROMIDE 50 MG/5ML IV SOLN
INTRAVENOUS | Status: AC
  Filled 2016-01-24: qty 1

## 2016-01-24 MED ORDER — SUCCINYLCHOLINE CHLORIDE 20 MG/ML IJ SOLN
INTRAMUSCULAR | Status: DC | PRN
  Administered 2016-01-24: 140 mg via INTRAVENOUS

## 2016-01-24 MED ORDER — PROPOFOL 10 MG/ML IV BOLUS
INTRAVENOUS | Status: DC | PRN
  Administered 2016-01-24: 180 mg via INTRAVENOUS

## 2016-01-24 MED ORDER — EPHEDRINE SULFATE 50 MG/ML IJ SOLN
INTRAMUSCULAR | Status: AC
  Filled 2016-01-24: qty 1

## 2016-01-24 MED ORDER — DOLUTEGRAVIR SODIUM 50 MG PO TABS
50.0000 mg | ORAL_TABLET | Freq: Two times a day (BID) | ORAL | Status: DC
  Administered 2016-01-24 – 2016-01-28 (×8): 50 mg via ORAL
  Filled 2016-01-24 (×10): qty 1

## 2016-01-24 MED ORDER — PHENOL 1.4 % MT LIQD
1.0000 | OROMUCOSAL | Status: DC | PRN
Start: 2016-01-24 — End: 2016-01-28

## 2016-01-24 MED ORDER — LIDOCAINE HCL (CARDIAC) 20 MG/ML IV SOLN
INTRAVENOUS | Status: AC
  Filled 2016-01-24: qty 5

## 2016-01-24 MED ORDER — SODIUM CHLORIDE 0.9 % IR SOLN
Status: DC | PRN
  Administered 2016-01-24 (×3): 3000 mL

## 2016-01-24 MED ORDER — METHOCARBAMOL 1000 MG/10ML IJ SOLN
500.0000 mg | Freq: Four times a day (QID) | INTRAVENOUS | Status: DC | PRN
  Filled 2016-01-24: qty 5

## 2016-01-24 MED ORDER — ZOLPIDEM TARTRATE 5 MG PO TABS
5.0000 mg | ORAL_TABLET | Freq: Every evening | ORAL | Status: DC | PRN
  Administered 2016-01-27: 5 mg via ORAL
  Filled 2016-01-24: qty 1

## 2016-01-24 MED ORDER — MINERAL OIL LIGHT 100 % EX OIL
TOPICAL_OIL | CUTANEOUS | Status: AC
  Filled 2016-01-24: qty 25

## 2016-01-24 MED ORDER — PHENYLEPHRINE HCL 10 MG/ML IJ SOLN
INTRAMUSCULAR | Status: DC | PRN
  Administered 2016-01-24 (×4): 40 ug via INTRAVENOUS
  Administered 2016-01-24: 80 ug via INTRAVENOUS
  Administered 2016-01-24 (×3): 40 ug via INTRAVENOUS

## 2016-01-24 MED ORDER — VANCOMYCIN HCL 1000 MG IV SOLR
INTRAVENOUS | Status: AC
  Filled 2016-01-24: qty 1000

## 2016-01-24 MED ORDER — OXYCODONE HCL 5 MG PO TABS
ORAL_TABLET | ORAL | Status: AC
  Filled 2016-01-24: qty 3

## 2016-01-24 MED ORDER — ONDANSETRON HCL 4 MG PO TABS: 4.0000 mg | ORAL_TABLET | Freq: Four times a day (QID) | ORAL | Status: DC | PRN

## 2016-01-24 MED ORDER — VANCOMYCIN HCL 1000 MG IV SOLR
1000.0000 mg | INTRAVENOUS | Status: DC | PRN
  Administered 2016-01-24: 1000 mg via INTRAVENOUS

## 2016-01-24 MED ORDER — ACETAMINOPHEN 650 MG RE SUPP: 650.0000 mg | Freq: Four times a day (QID) | RECTAL | Status: DC | PRN

## 2016-01-24 MED ORDER — FENTANYL CITRATE (PF) 100 MCG/2ML IJ SOLN
25.0000 ug | INTRAMUSCULAR | Status: DC | PRN
  Administered 2016-01-24: 50 ug via INTRAVENOUS

## 2016-01-24 MED ORDER — DIPHENHYDRAMINE HCL 12.5 MG/5ML PO ELIX: 12.5000 mg | ORAL_SOLUTION | ORAL | Status: DC | PRN

## 2016-01-24 MED ORDER — VANCOMYCIN HCL 1000 MG IV SOLR
INTRAVENOUS | Status: DC | PRN
  Administered 2016-01-24: 3 g

## 2016-01-24 MED ORDER — DOCUSATE SODIUM 100 MG PO CAPS
100.0000 mg | ORAL_CAPSULE | Freq: Two times a day (BID) | ORAL | Status: DC
  Administered 2016-01-24 – 2016-01-28 (×8): 100 mg via ORAL
  Filled 2016-01-24 (×8): qty 1

## 2016-01-24 MED ORDER — ONDANSETRON HCL 4 MG/2ML IJ SOLN
INTRAMUSCULAR | Status: DC | PRN
  Administered 2016-01-24 (×2): 4 mg via INTRAVENOUS

## 2016-01-24 MED ORDER — VANCOMYCIN HCL 1000 MG IV SOLR
INTRAVENOUS | Status: AC
  Filled 2016-01-24: qty 2000

## 2016-01-24 MED ORDER — METHOCARBAMOL 500 MG PO TABS
ORAL_TABLET | ORAL | Status: AC
  Filled 2016-01-24: qty 1

## 2016-01-24 MED ORDER — ALUM & MAG HYDROXIDE-SIMETH 200-200-20 MG/5ML PO SUSP: 30.0000 mL | ORAL | Status: DC | PRN

## 2016-01-24 MED ORDER — PHENYLEPHRINE 40 MCG/ML (10ML) SYRINGE FOR IV PUSH (FOR BLOOD PRESSURE SUPPORT)
PREFILLED_SYRINGE | INTRAVENOUS | Status: AC
Start: 2016-01-24 — End: 2016-01-24
  Filled 2016-01-24: qty 10

## 2016-01-24 MED ORDER — PROPOFOL 10 MG/ML IV BOLUS
INTRAVENOUS | Status: AC
  Filled 2016-01-24: qty 20

## 2016-01-24 MED ORDER — 0.9 % SODIUM CHLORIDE (POUR BTL) OPTIME
TOPICAL | Status: DC | PRN
  Administered 2016-01-24: 1000 mL

## 2016-01-24 MED ORDER — ACETAMINOPHEN 325 MG PO TABS
650.0000 mg | ORAL_TABLET | Freq: Four times a day (QID) | ORAL | Status: DC | PRN
  Administered 2016-01-27: 650 mg via ORAL
  Filled 2016-01-24: qty 2

## 2016-01-24 MED ORDER — LACTATED RINGERS IV SOLN
INTRAVENOUS | Status: DC
  Administered 2016-01-24 (×3): via INTRAVENOUS

## 2016-01-24 MED ORDER — FENTANYL CITRATE (PF) 100 MCG/2ML IJ SOLN
INTRAMUSCULAR | Status: DC | PRN
  Administered 2016-01-24: 100 ug via INTRAVENOUS
  Administered 2016-01-24 (×8): 50 ug via INTRAVENOUS

## 2016-01-24 MED ORDER — ROCURONIUM BROMIDE 100 MG/10ML IV SOLN
INTRAVENOUS | Status: DC | PRN
  Administered 2016-01-24: 25 mg via INTRAVENOUS

## 2016-01-24 MED ORDER — PHENYLEPHRINE 40 MCG/ML (10ML) SYRINGE FOR IV PUSH (FOR BLOOD PRESSURE SUPPORT)
PREFILLED_SYRINGE | INTRAVENOUS | Status: AC
  Filled 2016-01-24: qty 10

## 2016-01-24 MED ORDER — METOCLOPRAMIDE HCL 5 MG/ML IJ SOLN
5.0000 mg | Freq: Three times a day (TID) | INTRAMUSCULAR | Status: DC | PRN
Start: 2016-01-24 — End: 2016-01-28

## 2016-01-24 MED ORDER — SODIUM CHLORIDE 0.9 % IV SOLN
INTRAVENOUS | Status: DC
  Administered 2016-01-26: 01:00:00 via INTRAVENOUS

## 2016-01-24 MED ORDER — ALPRAZOLAM 0.5 MG PO TABS
1.0000 mg | ORAL_TABLET | Freq: Every evening | ORAL | Status: DC | PRN
  Administered 2016-01-24 – 2016-01-26 (×3): 1 mg via ORAL
  Filled 2016-01-24 (×3): qty 2

## 2016-01-24 MED ORDER — MIDAZOLAM HCL 2 MG/2ML IJ SOLN
INTRAMUSCULAR | Status: AC
  Filled 2016-01-24: qty 2

## 2016-01-24 MED ORDER — SUCCINYLCHOLINE CHLORIDE 20 MG/ML IJ SOLN
INTRAMUSCULAR | Status: AC
  Filled 2016-01-24: qty 1

## 2016-01-24 MED ORDER — METHOCARBAMOL 500 MG PO TABS
500.0000 mg | ORAL_TABLET | Freq: Four times a day (QID) | ORAL | Status: DC | PRN
  Administered 2016-01-24 – 2016-01-26 (×6): 500 mg via ORAL
  Filled 2016-01-24 (×6): qty 1

## 2016-01-24 MED ORDER — HYDROMORPHONE HCL 1 MG/ML IJ SOLN
1.0000 mg | INTRAMUSCULAR | Status: DC | PRN
  Administered 2016-01-24 – 2016-01-26 (×5): 1 mg via INTRAVENOUS
  Filled 2016-01-24 (×5): qty 1

## 2016-01-24 MED ORDER — DEXAMETHASONE SODIUM PHOSPHATE 10 MG/ML IJ SOLN
INTRAMUSCULAR | Status: AC
  Filled 2016-01-24: qty 1

## 2016-01-24 MED ORDER — VANCOMYCIN HCL IN DEXTROSE 1-5 GM/200ML-% IV SOLN
INTRAVENOUS | Status: AC
  Filled 2016-01-24: qty 200

## 2016-01-24 MED ORDER — ONDANSETRON HCL 4 MG/2ML IJ SOLN
4.0000 mg | Freq: Four times a day (QID) | INTRAMUSCULAR | Status: DC | PRN
  Administered 2016-01-25: 4 mg via INTRAVENOUS
  Filled 2016-01-24: qty 2

## 2016-01-24 SURGICAL SUPPLY — 52 items
BAG DECANTER FOR FLEXI CONT (MISCELLANEOUS) IMPLANT
BLADE 10 SAFETY STRL DISP (BLADE) ×2 IMPLANT
BONE CEMENT GENTAMICIN (Cement) ×8 IMPLANT
BRUSH FEMORAL CANAL (MISCELLANEOUS) ×2 IMPLANT
CEMENT BONE GENTAMICIN 40 (Cement) ×4 IMPLANT
CEMENTRALIZER 8.5MM (Orthopedic Implant) ×2 IMPLANT
COVER SURGICAL LIGHT HANDLE (MISCELLANEOUS) ×2 IMPLANT
DRAPE IMP U-DRAPE 54X76 (DRAPES) ×2 IMPLANT
DRAPE ORTHO SPLIT 77X108 STRL (DRAPES) ×4
DRAPE SURG ORHT 6 SPLT 77X108 (DRAPES) ×2 IMPLANT
DRAPE U-SHAPE 47X51 STRL (DRAPES) ×2 IMPLANT
DRSG AQUACEL AG ADV 3.5X10 (GAUZE/BANDAGES/DRESSINGS) ×2 IMPLANT
DURAPREP 26ML APPLICATOR (WOUND CARE) ×2 IMPLANT
ELECT CAUTERY BLADE 6.4 (BLADE) IMPLANT
ELECT REM PT RETURN 9FT ADLT (ELECTROSURGICAL)
ELECTRODE REM PT RTRN 9FT ADLT (ELECTROSURGICAL) IMPLANT
GAUZE XEROFORM 5X9 LF (GAUZE/BANDAGES/DRESSINGS) ×2 IMPLANT
GLOVE BIO SURGEON STRL SZ 6.5 (GLOVE) ×2 IMPLANT
GLOVE BIO SURGEON STRL SZ8 (GLOVE) ×2 IMPLANT
GLOVE BIOGEL PI IND STRL 8 (GLOVE) ×1 IMPLANT
GLOVE BIOGEL PI INDICATOR 8 (GLOVE) ×1
GLOVE ORTHO TXT STRL SZ7.5 (GLOVE) ×4 IMPLANT
GOWN STRL REUS W/ TWL LRG LVL3 (GOWN DISPOSABLE) ×1 IMPLANT
GOWN STRL REUS W/ TWL XL LVL3 (GOWN DISPOSABLE) ×4 IMPLANT
GOWN STRL REUS W/TWL LRG LVL3 (GOWN DISPOSABLE) ×2
GOWN STRL REUS W/TWL XL LVL3 (GOWN DISPOSABLE) ×8
HANDPIECE INTERPULSE COAX TIP (DISPOSABLE)
HEAD FEM STD 32X+1 STRL (Hips) ×2 IMPLANT
KIT BASIN OR (CUSTOM PROCEDURE TRAY) ×2 IMPLANT
KIT ROOM TURNOVER OR (KITS) ×2 IMPLANT
LINER ACET CUP 42MMX32MM (Hips) ×2 IMPLANT
MANIFOLD NEPTUNE II (INSTRUMENTS) ×2 IMPLANT
NS IRRIG 1000ML POUR BTL (IV SOLUTION) ×2 IMPLANT
PACK TOTAL JOINT (CUSTOM PROCEDURE TRAY) ×2 IMPLANT
PACK UNIVERSAL I (CUSTOM PROCEDURE TRAY) ×2 IMPLANT
PAD ARMBOARD 7.5X6 YLW CONV (MISCELLANEOUS) ×4 IMPLANT
SET HNDPC FAN SPRY TIP SCT (DISPOSABLE) IMPLANT
SPONGE LAP 18X18 X RAY DECT (DISPOSABLE) ×6 IMPLANT
STEM HIP OFFSET 105MM SZ1 (Stem) ×2 IMPLANT
SUT VIC AB 0 CTX 36 (SUTURE) ×2
SUT VIC AB 0 CTX36XBRD ANTBCTR (SUTURE) ×1 IMPLANT
SUT VIC AB 1 CTX 36 (SUTURE) ×2
SUT VIC AB 1 CTX36XBRD ANBCTR (SUTURE) ×1 IMPLANT
SUT VIC AB 2-0 CT1 27 (SUTURE) ×4
SUT VIC AB 2-0 CT1 TAPERPNT 27 (SUTURE) ×2 IMPLANT
SWAB COLLECTION DEVICE MRSA (MISCELLANEOUS) ×4 IMPLANT
TOWEL OR 17X24 6PK STRL BLUE (TOWEL DISPOSABLE) ×2 IMPLANT
TOWEL OR 17X26 10 PK STRL BLUE (TOWEL DISPOSABLE) ×2 IMPLANT
TRAY FOLEY W/METER SILVER 16FR (SET/KITS/TRAYS/PACK) ×2 IMPLANT
TUBE ANAEROBIC SPECIMEN COL (MISCELLANEOUS) IMPLANT
UNDERPAD 30X30 INCONTINENT (UNDERPADS AND DIAPERS) ×2 IMPLANT
WATER STERILE IRR 1000ML POUR (IV SOLUTION) ×2 IMPLANT

## 2016-01-24 NOTE — Anesthesia Preprocedure Evaluation (Signed)
Anesthesia Evaluation  Patient identified by MRN, date of birth, ID band Patient awake    Reviewed: Allergy & Precautions, H&P , NPO status , Patient's Chart, lab work & pertinent test results  Airway Mallampati: II  TM Distance: >3 FB Neck ROM: Full    Dental no notable dental hx. (+) Dental Advisory Given   Pulmonary Current Smoker,    Pulmonary exam normal breath sounds clear to auscultation       Cardiovascular negative cardio ROS Normal cardiovascular exam Rhythm:Regular Rate:Normal     Neuro/Psych PSYCHIATRIC DISORDERS Anxiety negative neurological ROS     GI/Hepatic Neg liver ROS, GERD  ,  Endo/Other  negative endocrine ROS  Renal/GU negative Renal ROS     Musculoskeletal negative musculoskeletal ROS (+) Arthritis , Osteoarthritis,    Abdominal   Peds  Hematology negative hematology ROS (+) anemia , HIV,   Anesthesia Other Findings   Reproductive/Obstetrics negative OB ROS                             Anesthesia Physical  Anesthesia Plan  ASA: II  Anesthesia Plan: General   Post-op Pain Management:    Induction: Intravenous  Airway Management Planned: Oral ETT  Additional Equipment:   Intra-op Plan:   Post-operative Plan: Extubation in OR  Informed Consent: I have reviewed the patients History and Physical, chart, labs and discussed the procedure including the risks, benefits and alternatives for the proposed anesthesia with the patient or authorized representative who has indicated his/her understanding and acceptance.   Dental advisory given  Plan Discussed with: CRNA  Anesthesia Plan Comments:         Anesthesia Quick Evaluation

## 2016-01-24 NOTE — Transfer of Care (Signed)
Immediate Anesthesia Transfer of Care Note  Patient: Ashley Shepherd  Procedure(s) Performed: Procedure(s): IRRIGATION AND DEBRIDEMENT LEFT HIP (Left) EXCISION OF LEFT TOTAL HIP ARTHROPLASTY WITH PLACEMENT OF ANTIBIOTIC SPACERS (Left)  Patient Location: PACU  Anesthesia Type:General  Level of Consciousness: awake  Airway & Oxygen Therapy: Patient Spontanous Breathing and Patient connected to nasal cannula oxygen  Post-op Assessment: Report given to RN and Post -op Vital signs reviewed and stable  Post vital signs: stable  Last Vitals:  Filed Vitals:   01/24/16 1401 01/24/16 2031  BP: 140/83 100/68  Pulse: 89 89  Temp: 36.9 C 37.1 C  Resp: 18 9    Complications: No apparent anesthesia complications

## 2016-01-24 NOTE — Anesthesia Procedure Notes (Signed)
Procedure Name: Intubation Date/Time: 01/24/2016 4:12 PM Performed by: Rejeana Brock L Pre-anesthesia Checklist: Patient identified, Emergency Drugs available, Suction available, Patient being monitored and Timeout performed Patient Re-evaluated:Patient Re-evaluated prior to inductionOxygen Delivery Method: Circle system utilized Preoxygenation: Pre-oxygenation with 100% oxygen Intubation Type: IV induction Ventilation: Mask ventilation without difficulty Laryngoscope Size: Mac and 3 Grade View: Grade I Tube type: Oral Tube size: 7.5 mm Number of attempts: 1 Airway Equipment and Method: Stylet Placement Confirmation: ETT inserted through vocal cords under direct vision,  positive ETCO2 and breath sounds checked- equal and bilateral Secured at: 21 cm Tube secured with: Tape Dental Injury: Teeth and Oropharynx as per pre-operative assessment

## 2016-01-24 NOTE — Progress Notes (Signed)
Pharmacy Antibiotic Note  Ashley Shepherd is a 60 y.o. female admitted on 01/24/2016 with infected hardware.  Pharmacy has been consulted for vancomycin dosing.  Plan: Rec'd vanc 1g IV perioperatively and ABX spacer placed in OR.  Will continue with vancomycin 1000mg  IV Q8H and monitor CBC, Cx, levels prn.  Height: 5\' 10"  (177.8 cm) Weight: 199 lb (90.266 kg) IBW/kg (Calculated) : 68.5  Temp (24hrs), Avg:98.7 F (37.1 C), Min:98.5 F (36.9 C), Max:99 F (37.2 C)    Allergies  Allergen Reactions  . Codeine Itching  . Ivp Dye [Iodinated Diagnostic Agents] Itching    Pt stated that she had NO problem/reaction to topical iodine/betadine solution.  . Sulfa Antibiotics Itching     Microbiology results: Gram stain from wound: GPC pairs  Thank you for allowing pharmacy to be a part of this patient's care.  Wynona Neat, PharmD, BCPS  01/24/2016 10:43 PM

## 2016-01-24 NOTE — H&P (Signed)
Ashley Shepherd is an 60 y.o. female.   Chief Complaint:   Left hip pain with possible prosthetic hip joint infection HPI:   60 yo female who has acute left hip pain over the past 4 weeks.  Had been doing well since her left primary total hip replacement in October 2015.  About a month ago started to develop what was thought to be lowe abdominal pain and was evaluated in [place] for possible diverticular disease vs pancreatitis.  She can to my office yesterday with significant left hip pain.  Her x-rays show new lucency around both the acetabular and femoral components worrisome for loosening.  This certainnly could be from infection.  He WBC peripherally in the office was 7, but her ESR is 60 and her CR-P is 12.7.  I this time, there is concern that her left hip prosthesis in infected and thus an operative intervention has been recommended.  Of note, she doe have immune deficiency from being HIV positive, but reports an undetectable viral load and normal CD4 count.  She reports good compliance with her HIV meds and denies any recent illnesses.  Past Medical History  Diagnosis Date  . HIV disease ([place])   . Cancer ([place])     7 years ago, cancer free now  . 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 Arthritis   . GERD (gastroesophageal reflux disease)   . History of kidney stones   . History of transfusion   . Anemia   . Difficulty sleeping   . Anxiety   . Pneumonia   . Depression   . Kidney stones     Past Surgical History  Procedure Laterality Date  . Appendectomy    . Knee arthroscopy      left  . Colon surgery  2008  . Colostomy reversal  2013  . Port-a-cath removal    . Total hip arthroplasty Left 10/01/2014    Procedure: LEFT TOTAL HIP ARTHROPLASTY ANTERIOR APPROACH;  Surgeon: Mcarthur Rossetti, MD;  Location: [facility];  Service: Orthopedics;  Laterality: Left;    Family History  Problem Relation Age of Onset  . Hypertension Father   . Diabetes Father   . Heart attack  Father   . Hypertension Other   . Diabetes Other    Social History:  reports that she has been smoking Cigarettes.  She has been smoking about 0.50 packs per day. She has never used smokeless tobacco. She reports that she does not drink alcohol or use illicit drugs.  Allergies:  Allergies  Allergen Reactions  . Codeine Itching  . Ivp Dye [Iodinated Diagnostic Agents] Itching    Pt stated that she had NO problem/reaction to topical iodine/betadine solution.  . Sulfa Antibiotics Itching    No prescriptions prior to admission    No results found for this or any previous visit (from the past 48 hour(s)). No results found.  Review of Systems  Gastrointestinal: Positive for abdominal pain.  Musculoskeletal: Positive for joint pain.  All other systems reviewed and are negative.   There were no vitals taken for this visit. Physical Exam  Constitutional: She is oriented to person, place, and time. She appears well-developed and well-nourished.  HENT:  Head: Normocephalic.  Eyes: EOM are normal. Pupils are equal, round, and reactive to light.  Neck: Normal range of motion. Neck supple.  Cardiovascular: Normal rate and regular rhythm.   Respiratory: Effort normal and breath sounds normal.  GI: Soft.  Bowel sounds are normal.  Musculoskeletal:       Left hip: She exhibits decreased range of motion, decreased strength and bony tenderness.  Neurological: She is alert and oriented to person, place, and time.  Skin: Skin is warm and dry.  Psychiatric: She has a normal mood and affect.     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  Will likely need a PICC line and 6 weeks of IV antibiotics.  The risks and benefits of surgery have been discussed in great  detail.  Mcarthur Rossetti 01/24/2016, 12:24 PM

## 2016-01-24 NOTE — Op Note (Signed)
NAMEMARET, DIAZ                  ACCOUNT NO.:  [id number]  MEDICAL RECORD NO.:  [id number]  LOCATION:  [facility]                         FACILITY:  [facility]  PHYSICIAN:  Lind Guest. Ninfa Linden, M.D.DATE OF BIRTH:  [birth year]  DATE OF PROCEDURE:  01/24/2016 DATE OF DISCHARGE:                              OPERATIVE REPORT   PREOPERATIVE DIAGNOSIS:  Infected left total hip arthroplasty.  POSTOPERATIVE DIAGNOSIS:  Infected left total hip arthroplasty.  PROCEDURE:  Irrigation and debridement of left hip with excision arthroplasty of all components and placement of a temporary antibiotic spacer.  FINDINGS:  A small pocket of gross purulence behind the acetabular and femoral components, primary Gram stain positive for Gram-positive cocci.  SURGEON:  Lind Guest. Ninfa Linden, MD  ASSISTANT:  Erskine Emery, PA-C  ANESTHESIA:  General.  ANTIBIOTICS:  1 g vancomycin after cultures were obtained as well as vancomycin and gentamicin in the antibiotic cement.  BLOOD LOSS:  400-450 mL.  COMPLICATIONS:  None.  INDICATIONS:  Ms. Sarna is a very pleasant 60 year old, well known to me. In October 2015, which was 15 months ago, she underwent a primary total hip arthroplasty of her left hip secondary to avascular necrosis.  Her avascular necrosis was result of her HIV disease which has commonly developed avascular necrosis.  She has always had an undetectable viral load and normal CD4 count.  She actually did very well with that surgery and we have not seen her for a long period of time and has had no recent illnesses that she is aware of.  About a month ago, she did develop an ear infection, she states but then the last month has developed worsening left hip pain.  I saw her in the office yesterday.  X-rays were obtained and I see lucencies behind the femoral and acetabular component and her pain is quite severe suggesting deep-seated infection. I did obtain peripheral white blood cell count which  was only 7000, but a CRP was 12.7 and a sed rate of 60.  Given these findings, I felt that the appropriate step would be to open up the hip, perform an irrigation and debridement and an excision arthroplasty based on what we were seeing in order to treat this infection.  Of note, her PCR data back in October 2015, she swabbed positive for Staph and for MRSA.  At that time, we gave her Ancef and I could not see what else we may have given her antibiotic was.  She understands the reason behind proceeding with an excision arthroplasty and placement of a temporary antibiotic spacer and she understands that she will end up needing to be on 6 weeks of IV antibiotics with Infectious Disease consult before a 2nd stage reimplantation.  PROCEDURE DESCRIPTION:  After informed consent was obtained, appropriate left hip was marked.  She was brought to the operating room.  General anesthesia was obtained while she was on her stretcher and then traction boots were placed on both of her feet.  Next, she was placed supine on the Hana fracture table with the perineal post in place and both legs in inline skeletal traction devices, but no traction applied.  Her left operative hip  was prepped and draped with DuraPrep and sterile drapes. A time-out was called and she was identified as correct patient, correct left hip.  We then made incision through her old incision and carried this down to the tensor fascia.  There was no infection in the superficial tissues.  We then opened up the tensor fascia, and still did not find any infection.  We dissected down the joint and then opened up the hip capsule.  When we manipulated around the implant, we did find some gross purulence, we felt and we sent this off for stat Gram staining culture, which did come back as rare Gram-positive cocci.  With that being said, we felt that the need was to be to remove all implants and to proceed with a two-stage revision-type of a  surgery.  It took some time, we were able to remove both femoral and acetabular components in their entirety and we did find evidence of purulence behind these areas.  We then thoroughly irrigated the femoral canal and around the femur curetting out the pockets, though we found an acetabular side as well and I then reamed the small area of acetabular bone as well.  Once we were able to fully irrigate the soft tissue with 9 L of normal saline solution, we then mixed our cement impregnated with vancomycin, gentamicin.  We were able to cement it, temporary acetabular shell which is polyethylene into the acetabulum and we were able to mix our PROSTALAC temporary femoral component after a broach for and we placed a size 1, was cemented around it.  Once the cement had hardened and both areas were placed in a standard 32+ 1 hip ball and reduced this in acetabulum and it was stable.  We then irrigated the soft tissues with normal saline solution again and we were able to close the tensor fascia with interrupted #1 Vicryl suture followed by 0 Vicryl in the deep tissue, 2-0 Vicryl in the subcutaneous tissue, and staples on the skin. Xeroform and Aquacel dressing was applied.  A Foley catheter was then placed.  She was awakened, extubated, and taken to recovery room in stable condition.  All final counts were correct.  There were no complications noted.  Of note, Erskine Emery, PA-C assisted in the entire case, his assistance group was crucial for facilitating all aspects of this case.     Lind Guest. Ninfa Linden, M.D.     CYB/MEDQ  D:  01/24/2016  T:  01/24/2016  Job:  [id number]

## 2016-01-24 NOTE — Brief Op Note (Signed)
01/24/2016  8:02 PM  PATIENT:  Ashley Shepherd  60 y.o. female  PRE-OPERATIVE DIAGNOSIS:  questionable infected left total hip   POST-OPERATIVE DIAGNOSIS:  questionable infected left total hip   PROCEDURE:  Procedure(s): IRRIGATION AND DEBRIDEMENT LEFT HIP (Left) EXCISION OF LEFT TOTAL HIP ARTHROPLASTY WITH PLACEMENT OF ANTIBIOTIC SPACERS (Left)  SURGEON:  Surgeon(s) and Role:    * Mcarthur Rossetti, MD - Primary  PHYSICIAN ASSISTANT: Benita Stabile, PA-C  ANESTHESIA:   general  EBL:  Total I/O In: 600 [I.V.:600] Out: 150 [Blood:150]  COUNTS:  YES  DICTATION: .Other Dictation: Dictation Number [id number]  PLAN OF CARE: Admit to inpatient   PATIENT DISPOSITION:  PACU - hemodynamically stable.   Delay start of Pharmacological VTE agent (>24hrs) due to surgical blood loss or risk of bleeding: no

## 2016-01-25 ENCOUNTER — Encounter (HOSPITAL_COMMUNITY): Payer: Self-pay | Admitting: Orthopaedic Surgery

## 2016-01-25 DIAGNOSIS — B9689 Other specified bacterial agents as the cause of diseases classified elsewhere: Secondary | ICD-10-CM

## 2016-01-25 DIAGNOSIS — Z21 Asymptomatic human immunodeficiency virus [HIV] infection status: Secondary | ICD-10-CM

## 2016-01-25 DIAGNOSIS — T8452XD Infection and inflammatory reaction due to internal left hip prosthesis, subsequent encounter: Secondary | ICD-10-CM

## 2016-01-25 DIAGNOSIS — Z9889 Other specified postprocedural states: Secondary | ICD-10-CM

## 2016-01-25 DIAGNOSIS — Y838 Other surgical procedures as the cause of abnormal reaction of the patient, or of later complication, without mention of misadventure at the time of the procedure: Secondary | ICD-10-CM

## 2016-01-25 DIAGNOSIS — M00852 Arthritis due to other bacteria, left hip: Secondary | ICD-10-CM

## 2016-01-25 LAB — CBC
HCT: 31.9 % — ABNORMAL LOW (ref 36.0–46.0)
HEMOGLOBIN: 10.7 g/dL — AB (ref 12.0–15.0)
MCH: 30.4 pg (ref 26.0–34.0)
MCHC: 33.5 g/dL (ref 30.0–36.0)
MCV: 90.6 fL (ref 78.0–100.0)
Platelets: 216 10*3/uL (ref 150–400)
RBC: 3.52 MIL/uL — AB (ref 3.87–5.11)
RDW: 15.4 % (ref 11.5–15.5)
WBC: 11.3 10*3/uL — AB (ref 4.0–10.5)

## 2016-01-25 LAB — BASIC METABOLIC PANEL
ANION GAP: 9 (ref 5–15)
BUN: 18 mg/dL (ref 6–20)
CHLORIDE: 106 mmol/L (ref 101–111)
CO2: 24 mmol/L (ref 22–32)
CREATININE: 1.62 mg/dL — AB (ref 0.44–1.00)
Calcium: 8.3 mg/dL — ABNORMAL LOW (ref 8.9–10.3)
GFR, EST AFRICAN AMERICAN: 39 mL/min — AB (ref >60)
GFR, EST NON AFRICAN AMERICAN: 34 mL/min — AB (ref >60)
Glucose, Bld: 156 mg/dL — ABNORMAL HIGH (ref 65–99)
POTASSIUM: 4.4 mmol/L (ref 3.5–5.1)
SODIUM: 139 mmol/L (ref 135–145)

## 2016-01-25 LAB — MRSA PCR SCREENING: MRSA by PCR: POSITIVE — AB

## 2016-01-25 MED ORDER — CHLORHEXIDINE GLUCONATE CLOTH 2 % EX PADS
6.0000 | MEDICATED_PAD | Freq: Every day | CUTANEOUS | Status: DC
  Administered 2016-01-27 – 2016-01-28 (×2): 6 via TOPICAL

## 2016-01-25 MED ORDER — SODIUM CHLORIDE 0.9% FLUSH: 10.0000 mL | INTRAVENOUS | Status: DC | PRN

## 2016-01-25 MED ORDER — MUPIROCIN 2 % EX OINT
1.0000 "application " | TOPICAL_OINTMENT | Freq: Two times a day (BID) | CUTANEOUS | Status: DC
  Administered 2016-01-25 – 2016-01-28 (×6): 1 via NASAL
  Filled 2016-01-25: qty 22

## 2016-01-25 MED ORDER — MARAVIROC 300 MG PO TABS
300.0000 mg | ORAL_TABLET | Freq: Two times a day (BID) | ORAL | Status: DC
  Administered 2016-01-25 – 2016-01-28 (×6): 300 mg via ORAL
  Filled 2016-01-25 (×8): qty 1

## 2016-01-25 MED ORDER — VANCOMYCIN HCL IN DEXTROSE 1-5 GM/200ML-% IV SOLN
1000.0000 mg | Freq: Two times a day (BID) | INTRAVENOUS | Status: DC
  Administered 2016-01-25 – 2016-01-26 (×2): 1000 mg via INTRAVENOUS
  Filled 2016-01-25 (×3): qty 200

## 2016-01-25 NOTE — Care Management Note (Signed)
Case Management Note  Patient Details  Name: Ashley Shepherd MRN: [id number] Date of Birth: 11-Dec-1956  Subjective/Objective:           Admitted with infected left prosthetic hip, s/p I and D with excision of arthroplasty components         Action/Plan: Spoke with patient, she is agreeable with learning to give IV antibiotics and her significant other is also agreeable. She has a family member who is a CNA and will also be able to assist her. Patient selected [facility]. Contacted Pam with [facility] and set up [place], IV antibiotics and HHPT. Patient stated that she has a rolling walker and 3N1 at home. Will continue to follow for discharge needs.     Expected Discharge Date:                  Expected Discharge Plan:  [place]  In-House Referral:  NA  Discharge planning Services  CM Consult  Post Acute Care Choice:  Home Health Choice offered to:  Patient  DME Arranged:    DME Agency:     HH Arranged:  RN, IV Antibiotics, PT HH Agency:  [facility]  Status of Service:  In process, will continue to follow  Medicare Important Message Given:    Date Medicare IM Given:    Medicare IM give by:    Date Additional Medicare IM Given:    Additional Medicare Important Message give by:     If discussed at [place] of Stay Meetings, dates discussed:    Additional Comments:  Nila Nephew, RN 01/25/2016, 12:03 PM

## 2016-01-25 NOTE — Progress Notes (Signed)
Utilization review completed.  

## 2016-01-25 NOTE — Progress Notes (Signed)
Peripherally Inserted Central Catheter/Midline Placement  The IV Nurse has discussed with the patient and/or persons authorized to consent for the patient, the purpose of this procedure and the potential benefits and risks involved with this procedure.  The benefits include less needle sticks, lab draws from the catheter and patient may be discharged home with the catheter.  Risks include, but not limited to, infection, bleeding, blood clot (thrombus formation), and puncture of an artery; nerve damage and irregular heat beat.  Alternatives to this procedure were also discussed.  PICC/Midline Placement Documentation        Ashley Shepherd 01/25/2016, 10:27 AM Consent obtained by Jule Economy, rn

## 2016-01-25 NOTE — Consult Note (Signed)
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 female with HIV disease, CD 4 count of 419/VL<20 (aug 2016) on salvage regimen of DLG/descovy/maraviroc. 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 Doing well up until last month starting to have increasing pain of Left hip. Seen by Dr. Ninfa Linden, who checked inflammatory markers, which were elevated in addition to plain film that showed lucency concerning for infection. She was taken to the OR on 1/31 for evaluation with possible explantation of HW. Purulence seen by acetabular cuff and loosening of femoral component. Dr. Ninfa Linden removed all hardware, sent cultures, and placed abtx spacer in anticipation for 2 staged revision. She was placed on vancomycin empirically, continued on HIV ART regimen. Gram stain showed abundant WBC and rare GPC  Past Medical History  Diagnosis Date  . HIV disease ([place])   . Cancer ([place])     7 years ago, cancer free now  . 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 Arthritis   . GERD (gastroesophageal reflux disease)   . History of kidney stones   . History of transfusion   . Anemia   . Difficulty sleeping   . Anxiety   . Pneumonia   . Depression   . Kidney stones     Allergies:  Allergies  Allergen Reactions  . Codeine Itching  . Ivp Dye [Iodinated Diagnostic Agents] Itching    Pt stated that she had NO problem/reaction to topical iodine/betadine solution.  . Sulfa Antibiotics Itching     MEDICATIONS: . aspirin EC  325 mg Oral BID PC  . citalopram  20 mg Oral Daily  . docusate sodium  100 mg Oral BID  . dolutegravir  50 mg Oral BID  .  emtricitabine-tenofovir AF  1 tablet Oral Daily  . maraviroc  300 mg Oral BID  . vancomycin  1,000 mg Intravenous Q12H    Social History  Substance Use Topics  . Smoking status: Current Every Day Smoker -- 0.50 packs/day    Types: Cigarettes  . Smokeless tobacco: Never Used  . Alcohol Use: No    Family History  Problem Relation Age of Onset  . Hypertension Father   . Diabetes Father   . Heart attack Father   . Hypertension Other   . Diabetes Other      Review of Systems  Constitutional: Negative for fever, chills, diaphoresis, activity change, appetite change, fatigue and unexpected weight change.  HENT: Negative for congestion, sore throat, rhinorrhea, sneezing, trouble swallowing and sinus pressure.  Eyes: Negative for photophobia and visual disturbance.  Respiratory: Negative for cough, chest tightness, shortness of breath, wheezing and stridor.  Cardiovascular: Negative for chest pain, palpitations and leg swelling.  Gastrointestinal: Negative for nausea, vomiting, abdominal pain, diarrhea, constipation, blood in stool, abdominal distention and anal bleeding.  Genitourinary: Negative for dysuria, hematuria, flank pain and difficulty urinating.  Musculoskeletal: +left hip pain Skin: Negative for color change, pallor, rash and wound.  Neurological: Negative for dizziness, tremors, weakness  and light-headedness.  Hematological: Negative for adenopathy. Does not bruise/bleed easily.  Psychiatric/Behavioral: Negative for behavioral problems, confusion, sleep disturbance, dysphoric mood, decreased concentration and agitation.     OBJECTIVE: Temp:  [97.7 F (36.5 C)-99 F (37.2 C)] 98.1 F (36.7 C) (02/01 0446) Pulse Rate:  [68-89] 68 (02/01 0446) Resp:  [7-18] 15 (02/01 0446) BP: (99-140)/(54-83) 105/57 mmHg (02/01 0446) SpO2:  [94 %-100 %] 100 % (02/01 0446) Weight:  [199 lb (90.266 kg)] 199 lb (90.266 kg) (01/31 1401) Physical Exam  Constitutional:  oriented to  person, place, and time. appears well-developed and well-nourished. No distress.  HENT: [place]/AT, PERRLA, no scleral icterus Mouth/Throat: Oropharynx is clear and moist. No oropharyngeal exudate.  Cardiovascular: Normal rate, regular rhythm and normal heart sounds. Exam reveals no gallop and no friction rub.  No murmur heard.  Pulmonary/Chest: Effort normal and breath sounds normal. No respiratory distress.  has no wheezes.  Neck = supple, no nuchal rigidity Abdominal: Soft. Bowel sounds are normal.  exhibits no distension. There is no tenderness.  Lymphadenopathy: no cervical adenopathy. No axillary adenopathy Neurological: alert and oriented to person, place, and time.  Skin: Skin is warm and dry. No rash noted. No erythema. Bandage on left hip Ext: right upper arm picc line c/d/i Psychiatric: a normal mood and affect.  behavior is normal.   LABS: Results for orders placed or performed during the hospital encounter of 01/24/16 (from the past 48 hour(s))  Anaerobic culture     Status: None (Preliminary result)   Collection Time: 01/24/16  4:48 PM  Result Value Ref Range   Specimen Description WOUND HIP LEFT    Special Requests NONE    Gram Stain      MODERATE WBC PRESENT,BOTH PMN AND MONONUCLEAR RARE GRAM POSITIVE COCCI IN PAIRS Performed at [facility] Performed at [facility]    Culture PENDING    Report Status PENDING   Gram stain     Status: None   Collection Time: 01/24/16  4:48 PM  Result Value Ref Range   Specimen Description WOUND HIP LEFT    Special Requests NONE    Gram Stain      MODERATE WBC PRESENT,BOTH PMN AND MONONUCLEAR RARE GRAM POSITIVE COCCI IN PAIRS    Report Status 01/24/2016 FINAL   Wound culture     Status: None (Preliminary result)   Collection Time: 01/24/16  4:48 PM  Result Value Ref Range   Specimen Description WOUND HIP LEFT    Special Requests NONE    Gram Stain      MODERATE WBC PRESENT,BOTH PMN AND MONONUCLEAR RARE GRAM  POSITIVE COCCI IPR  Performed at [facility] Performed at [facility]    Culture NO GROWTH Performed at [facility]     Report Status PENDING   Gram stain     Status: None   Collection Time: 01/24/16  6:29 PM  Result Value Ref Range   Specimen Description WOUND LEFT HIP    Special Requests SET 2    Gram Stain      ABUNDANT WBC PRESENT, PREDOMINANTLY PMN RARE GRAM POSITIVE COCCI IN PAIRS Gram Stain Report Called to,Read Back By and Verified WithHilarie Fredrickson RN 2150 01/24/16 A BROWNING    Report Status 01/24/2016 FINAL   Wound culture     Status: None (Preliminary result)   Collection Time: 01/24/16  6:29 PM  Result Value Ref Range   Specimen Description WOUND LEFT HIP    Special  Requests SET 2    Gram Stain      ABUNDANT WBC PRESENT, PREDOMINANTLY PMN RARE GRAM POSITIVE COCCI IN PAIRS Gram Stain Report Called to,Read Back By and Verified With: Gram Stain Report Called to,Read Back By and Verified With: S YOUNT RN 01/24/16 @2150  BY A BROWNING Performed at [facility] Performed at [facility]    Culture PENDING    Report Status PENDING   CBC     Status: Abnormal   Collection Time: 01/25/16  5:40 AM  Result Value Ref Range   WBC 11.3 (H) 4.0 - 10.5 K/uL   RBC 3.52 (L) 3.87 - 5.11 MIL/uL   Hemoglobin 10.7 (L) 12.0 - 15.0 g/dL   HCT 31.9 (L) 36.0 - 46.0 %   MCV 90.6 78.0 - 100.0 fL   MCH 30.4 26.0 - 34.0 pg   MCHC 33.5 30.0 - 36.0 g/dL   RDW 15.4 11.5 - 15.5 %   Platelets 216 150 - 400 K/uL  Basic metabolic panel     Status: Abnormal   Collection Time: 01/25/16  5:40 AM  Result Value Ref Range   Sodium 139 135 - 145 mmol/L   Potassium 4.4 3.5 - 5.1 mmol/L   Chloride 106 101 - 111 mmol/L   CO2 24 22 - 32 mmol/L   Glucose, Bld 156 (H) 65 - 99 mg/dL   BUN 18 6 - 20 mg/dL   Creatinine, Ser 1.62 (H) 0.44 - 1.00 mg/dL   Calcium 8.3 (L) 8.9 - 10.3 mg/dL   GFR calc non Af Amer 34 (L) >60 mL/min   GFR calc Af Amer 39 (L) >60 mL/min     Comment: (NOTE) The eGFR has been calculated using the CKD EPI equation. This calculation has not been validated in all clinical situations. eGFR's persistently <60 mL/min signify possible Chronic Kidney Disease.    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 Status post I and D of left hip with excision of left hip total arthroplasty and placement of antibiotic spacer. EXAM: PORTABLE PELVIS 1-2 VIEWS COMPARISON:  Most recent imaging 11/01/2014 FINDINGS: Previous left hip arthroplasty is been excised with placement of the left hip antibiotic spacer. No acute fractures seen. There is probable sclerosis of the left acetabulum around the antibiotic spacer. Air in the subcutaneous soft tissues and hip joint consistent with recent surgery. IMPRESSION: Placement of left hip antibiotic spacer post removal of left hip arthroplasty. No immediate postoperative complication. Electronically Signed   By: Jeb Levering M.D.   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  Elzie Rings [facility] for Infectious [facility] [phone]

## 2016-01-25 NOTE — Evaluation (Signed)
Occupational Therapy Evaluation Patient Details Name: Ashley Shepherd MRN: [id number] DOB: [birth year] Today's Date: 01/25/2016    History of Present Illness Pt presents for infected left THA and underwent I & D as well as hardware removal and placement of Abx spacer. PMH: HIV   Clinical Impression   Pt reports she was independent with ADLs PTA. Currently pt overall min assist for ADLs and functional mobility. Began safety and ADL education with pt. Pt planning to d/c home with 24/7 supervision from her husband. Pt would benefit from continued skilled OT to address established goals.     Follow Up Recommendations  No OT follow up;Supervision/Assistance - 24 hour    Equipment Recommendations  None recommended by OT    Recommendations for Other Services       Precautions / Restrictions Precautions Precautions: Fall Restrictions Weight Bearing Restrictions: Yes LLE Weight Bearing: Weight bearing as tolerated      Mobility Bed Mobility Overal bed mobility: Needs Assistance Bed Mobility: Supine to Sit     Supine to sit: Min assist;HOB elevated     General bed mobility comments: Min assist to manage LEs. Pt able to control trunk and hips to EOB. Good hand placement throughout.  Transfers Overall transfer level: Needs assistance Equipment used: Rolling walker (2 wheeled) Transfers: Sit to/from Stand Sit to Stand: Min assist         General transfer comment: Min assist to boost up from EOB. VCs for hand placement.    Balance Overall balance assessment: Needs assistance Sitting-balance support: Feet supported;No upper extremity supported Sitting balance-Leahy Scale: Fair Sitting balance - Comments: due to pain   Standing balance support: Bilateral upper extremity supported Standing balance-Leahy Scale: Poor Standing balance comment: RW for support                            ADL Overall ADL's : Needs assistance/impaired Eating/Feeding: Set up;Sitting    Grooming: Set up;Sitting       Lower Body Bathing: Minimal assistance;Sit to/from stand       Lower Body Dressing: Minimal assistance;Sit to/from stand Lower Body Dressing Details (indicate cue type and reason): Pt able to reach bilateral feet. Anticipate pt may have difficulty starting socks/pants over feet. Pt reports husband is available to assist as needed. Educated on compensatory strategies for LB ADLs; pt recalls from prior hip sx. Toilet Transfer: Minimal Systems analyst Details (indicate cue type and reason): Simulated by transfer from EOB to chair.         Functional mobility during ADLs: Minimal assistance;Rolling walker (for stand pivot transfer) General ADL Comments: No family present for OT eval. Pt tolerated treatment well despite nausea (RN aware).     Vision     Perception     Praxis      Pertinent Vitals/Pain Pain Assessment: Faces Faces Pain Scale: Hurts little more Pain Location: L hip with movement +nausea Pain Descriptors / Indicators: Grimacing;Operative site guarding;Sore Pain Intervention(s): Limited activity within patient's tolerance;Monitored during session;Repositioned;Premedicated before session     Hand Dominance     Extremity/Trunk Assessment Upper Extremity Assessment Upper Extremity Assessment: Overall WFL for tasks assessed   Lower Extremity Assessment Lower Extremity Assessment: Defer to PT evaluation LLE Deficits / Details: unable to move leg against gravity LLE: Unable to fully assess due to pain   Cervical / Trunk Assessment Cervical / Trunk Assessment: Normal   Communication Communication Communication: No difficulties   Cognition Arousal/Alertness: Awake/alert  Behavior During Therapy: WFL for tasks assessed/performed Overall Cognitive Status: Within Functional Limits for tasks assessed                     General Comments       Exercises Exercises: General Lower Extremity      Shoulder Instructions      Home Living Family/patient expects to be discharged to:: Private residence Living Arrangements: Spouse/significant other;Children Available Help at Discharge: Family;Available 24 hours/day Type of Home: House Home Access: Stairs to enter [employer] of Steps: 2 Entrance Stairs-Rails: None Home Layout: One level     Bathroom Shower/Tub: Tub/shower unit Shower/tub characteristics: Architectural technologist: Standard Bathroom Accessibility: Yes How Accessible: Accessible via walker Home Equipment: [place] - 2 wheels;Bedside commode;Cane - single point;Tub bench          Prior Functioning/Environment Level of Independence: Independent        Comments: pt had begun to use RW again when hip started to hurt again (past few days). Original replacement 15 mos ago    OT Diagnosis: Generalized weakness;Acute pain   OT Problem List: Decreased strength;Decreased activity tolerance;Impaired balance (sitting and/or standing);Decreased safety awareness;Decreased knowledge of use of DME or AE;Decreased knowledge of precautions;Pain   OT Treatment/Interventions: Self-care/ADL training;Energy conservation;DME and/or AE instruction;Patient/family education    OT Goals(Current goals can be found in the care plan section) Acute Rehab OT Goals Patient Stated Goal: get infection out of hip OT Goal Formulation: With patient Time For Goal Achievement: 02/08/16 Potential to Achieve Goals: Good ADL Goals Pt Will Perform Grooming: with supervision;standing Pt Will Transfer to Toilet: with supervision;ambulating;bedside commode (over toilet) Pt Will Perform Toileting - Clothing Manipulation and hygiene: with supervision;sit to/from stand Pt Will Perform Tub/Shower Transfer: Tub transfer;with supervision;ambulating;tub bench;rolling walker  OT Frequency: Min 2X/week   Barriers to D/C:            Co-evaluation              End of Session Equipment  Utilized During Treatment: Rolling walker  Activity Tolerance: Patient tolerated treatment well Patient left: in chair;with call bell/phone within reach;with chair alarm set;with nursing/sitter in room   Time: [id number] OT Time Calculation (min): 19 min Charges:  OT General Charges $OT Visit: 1 Procedure OT Evaluation $OT Eval Moderate Complexity: 1 Procedure G-Codes:     Binnie Kand M.S., OTR/L Pager: [phone]  01/25/2016, 3:51 PM

## 2016-01-25 NOTE — Progress Notes (Signed)
Subjective: 1 Day Post-Op Procedure(s) (LRB): IRRIGATION AND DEBRIDEMENT LEFT HIP (Left) EXCISION OF LEFT TOTAL HIP ARTHROPLASTY WITH PLACEMENT OF ANTIBIOTIC SPACERS (Left) Patient reports pain as moderate.  Gram stain from OR did show gram positive cocci.  Labs not back yet this am.  Objective: Vital signs in last 24 hours: Temp:  [97.7 F (36.5 C)-99 F (37.2 C)] 98.1 F (36.7 C) (02/01 0446) Pulse Rate:  [68-89] 68 (02/01 0446) Resp:  [7-18] 15 (02/01 0446) BP: (99-140)/(54-83) 105/57 mmHg (02/01 0446) SpO2:  [94 %-100 %] 100 % (02/01 0446) Weight:  [90.266 kg (199 lb)] 90.266 kg (199 lb) (01/31 1401)  Intake/Output from previous day: 01/31 0701 - 02/01 0700 In: 3387.5 [P.O.:100; I.V.:2837.5; IV Piggyback:200] Out: 950 [Urine:550; Blood:400] Intake/Output this shift:    No results for input(s): HGB in the last 72 hours. No results for input(s): WBC, RBC, HCT, PLT in the last 72 hours. No results for input(s): NA, K, CL, CO2, BUN, CREATININE, GLUCOSE, CALCIUM in the last 72 hours. No results for input(s): LABPT, INR in the last 72 hours.  Sensation intact distally Intact pulses distally Dorsiflexion/Plantar flexion intact Incision: scant drainage  Assessment/Plan: 1 Day Post-Op Procedure(s) (LRB): IRRIGATION AND DEBRIDEMENT LEFT HIP (Left) EXCISION OF LEFT TOTAL HIP ARTHROPLASTY WITH PLACEMENT OF ANTIBIOTIC SPACERS (Left) Up with therapy Continue ABX therapy due to Culture taken of surgical site during joint revision  Will need PICC line and at least 6 weeks of IV antibiotics before replantation of new hip implant. Can put weight on temporary implant with walker.  Rut [place] Y 01/25/2016, 7:13 AM

## 2016-01-25 NOTE — Anesthesia Postprocedure Evaluation (Signed)
Anesthesia Post Note  Patient: [name]  Procedure(s) Performed: Procedure(s) (LRB): IRRIGATION AND DEBRIDEMENT LEFT HIP (Left) EXCISION OF LEFT TOTAL HIP ARTHROPLASTY WITH PLACEMENT OF ANTIBIOTIC SPACERS (Left)  Patient location during evaluation: PACU Anesthesia Type: General Level of consciousness: awake and alert Pain management: satisfactory to patient Vital Signs Assessment: post-procedure vital signs reviewed and stable Respiratory status: spontaneous breathing, nonlabored ventilation, respiratory function stable and patient connected to nasal cannula oxygen Cardiovascular status: blood pressure returned to baseline and stable Postop Assessment: no signs of nausea or vomiting Anesthetic complications: no    Last Vitals:  Filed Vitals:   01/25/16 0215 01/25/16 0446  BP: 114/57 105/57  Pulse: 77 68  Temp: 36.5 C 36.7 C  Resp: 15 15    Last Pain:  Filed Vitals:   01/25/16 0504  PainSc: [place] Turk

## 2016-01-25 NOTE — Progress Notes (Signed)
Pharmacy Antibiotic Note  60 yo female admitted with potential prosthetic hip infection. Pain x 4 weeks  PMH: HIV, GERD  Plan: Based on renal fx, change regimen to 1g q12h Monitor culture results, renal fx, VT prn  Height: 5\' 10"  (177.8 cm) Weight: 199 lb (90.266 kg) IBW/kg (Calculated) : 68.5  Temp (24hrs), Avg:98.4 F (36.9 C), Min:97.7 F (36.5 C), Max:99 F (37.2 C)   Recent Labs Lab 01/25/16 0540  WBC 11.3*  CREATININE 1.62*    Estimated Creatinine Clearance: 45.6 mL/min (by C-G formula based on Cr of 1.62).    Allergies  Allergen Reactions  . Codeine Itching  . Ivp Dye [Iodinated Diagnostic Agents] Itching    Pt stated that she had NO problem/reaction to topical iodine/betadine solution.  . Sulfa Antibiotics Itching    Antimicrobials this admission: Vancomycin 1/31>>  Dose adjustments this admission: None  Microbiology results: 1/31 Wound Cx Pending  Thank you for allowing pharmacy to be a part of this patient's care.  Levester Fresh, PharmD, BCPS, [facility] Clinical Pharmacist Pager [phone] 01/25/2016 11:01 AM

## 2016-01-25 NOTE — Evaluation (Signed)
Physical Therapy Evaluation Patient Details Name: Ashley Shepherd MRN: [id number] DOB: 28-May-1956 Today's Date: 01/25/2016   History of Present Illness  Pt presents for infected left THA and underwent I & D as well as hardware removal and placement of Abx spacer. PMH: HIV  Clinical Impression  Pt admitted with above diagnosis. Pt currently with functional limitations due to the deficits listed below (see PT Problem List). Pt with increased pain with bed mobility and transfers but once she was up, was able to ambulate with min A 25' with RW.  Pt will benefit from skilled PT to increase their independence and safety with mobility to allow discharge to the venue listed below.       Follow Up Recommendations Home health PT;Supervision for mobility/OOB    Equipment Recommendations  None recommended by PT    Recommendations for Other Services OT consult     Precautions / Restrictions Precautions Precautions: Fall Restrictions Weight Bearing Restrictions: Yes LLE Weight Bearing: Weight bearing as tolerated      Mobility  Bed Mobility Overal bed mobility: Needs Assistance;+2 for physical assistance Bed Mobility: Supine to Sit     Supine to sit: Mod assist;+2 for physical assistance     General bed mobility comments: pt rolled to right, vc's for grasping of bed rail and rolling to left, mod A for mgmt of LLE as well as elavtion of trunk and scooting hips to EOB  Transfers Overall transfer level: Needs assistance Equipment used: Rolling walker (2 wheeled) Transfers: Sit to/from Stand Sit to Stand: Min assist         General transfer comment: vc's for hand placement as well as fwd wt shift but once pt began transfer, she was able to stand with only min A.   Ambulation/Gait Ambulation/Gait assistance: Min assist;+2 safety/equipment Ambulation Distance (Feet): 25 Feet Assistive device: Rolling walker (2 wheeled) Gait Pattern/deviations: Step-to pattern;Decreased weight shift to  left;Decreased stance time - left;Decreased step length - left Gait velocity: decreased Gait velocity interpretation: <1.8 ft/sec, indicative of risk for recurrent falls General Gait Details: vc's for sequencing to manage pain. Pt reported LLE feeling better with ambulation than in supine or sitting.   Stairs            Wheelchair Mobility    Modified Rankin (Stroke Patients Only)       Balance Overall balance assessment: Needs assistance Sitting-balance support: Single extremity supported;Feet supported Sitting balance-Leahy Scale: Poor Sitting balance - Comments: due to pain   Standing balance support: Bilateral upper extremity supported Standing balance-Leahy Scale: Poor                               Pertinent Vitals/Pain Pain Assessment: Faces Faces Pain Scale: Hurts whole lot Pain Location: left hip Pain Descriptors / Indicators: Aching Pain Intervention(s): Limited activity within patient's tolerance;Monitored during session;Premedicated before session    Home Living Family/patient expects to be discharged to:: Private residence Living Arrangements: Spouse/significant other;Children Available Help at Discharge: Family;Available 24 hours/day Type of Home: House Home Access: Stairs to enter Entrance Stairs-Rails: None Entrance Stairs-Number of Steps: 2 Home Layout: One level Home Equipment: Walker - 2 wheels;Bedside commode;Cane - single point      Prior Function Level of Independence: Independent         Comments: pt had begun to use RW again when hip started to hurt again. Original replacement 15 mos ago     Hand Dominance  Extremity/Trunk Assessment   Upper Extremity Assessment: Defer to OT evaluation           Lower Extremity Assessment: LLE deficits/detail   LLE Deficits / Details: unable to move leg against gravity  Cervical / Trunk Assessment: Normal  Communication   Communication: No difficulties  Cognition  Arousal/Alertness: Awake/alert Behavior During Therapy: WFL for tasks assessed/performed Overall Cognitive Status: Within Functional Limits for tasks assessed                      General Comments General comments (skin integrity, edema, etc.): O2 sats 100% on RA, O2 left off    Exercises General Exercises - Lower Extremity Ankle Circles/Pumps: AROM;Both;10 reps;Seated Quad Sets: AROM;Both;10 reps;Seated Gluteal Sets: AROM;Both;10 reps;Seated      Assessment/Plan    PT Assessment Patient needs continued PT services  PT Diagnosis Difficulty walking;Abnormality of gait;Acute pain   PT Problem List Decreased strength;Decreased range of motion;Decreased activity tolerance;Decreased balance;Decreased mobility;Decreased knowledge of use of DME;Decreased knowledge of precautions;Pain  PT Treatment Interventions DME instruction;Gait training;Stair training;Functional mobility training;Therapeutic activities;Therapeutic exercise;Balance training;Patient/family education   PT Goals (Current goals can be found in the Care Plan section) Acute Rehab PT Goals Patient Stated Goal: get infection out of hip PT Goal Formulation: With patient/family Time For Goal Achievement: 02/01/16 Potential to Achieve Goals: Good    Frequency Min 5X/week   Barriers to discharge        Co-evaluation               End of Session Equipment Utilized During Treatment: Gait belt Activity Tolerance: Patient tolerated treatment well Patient left: in chair;with call bell/phone within reach;with family/visitor present Nurse Communication: Mobility status         Time: [id number] PT Time Calculation (min) (ACUTE ONLY): 27 min   Charges:   PT Evaluation $PT Eval Moderate Complexity: 1 Procedure PT Treatments $Gait Training: 8-22 mins   PT G Codes:       [name], PT  Acute Rehab Services  [place], Ashley Shepherd 01/25/2016, 1:33 PM

## 2016-01-26 DIAGNOSIS — B951 Streptococcus, group B, as the cause of diseases classified elsewhere: Secondary | ICD-10-CM

## 2016-01-26 DIAGNOSIS — N183 Chronic kidney disease, stage 3 (moderate): Secondary | ICD-10-CM

## 2016-01-26 LAB — CBC
HEMATOCRIT: 26.7 % — AB (ref 36.0–46.0)
Hemoglobin: 8.8 g/dL — ABNORMAL LOW (ref 12.0–15.0)
MCH: 29.9 pg (ref 26.0–34.0)
MCHC: 33 g/dL (ref 30.0–36.0)
MCV: 90.8 fL (ref 78.0–100.0)
PLATELETS: 183 10*3/uL (ref 150–400)
RBC: 2.94 MIL/uL — AB (ref 3.87–5.11)
RDW: 14.9 % (ref 11.5–15.5)
WBC: 8.3 10*3/uL (ref 4.0–10.5)

## 2016-01-26 LAB — WOUND CULTURE

## 2016-01-26 LAB — SEDIMENTATION RATE: SED RATE: 95 mm/h — AB (ref 0–22)

## 2016-01-26 LAB — BASIC METABOLIC PANEL
Anion gap: 8 (ref 5–15)
BUN: 18 mg/dL (ref 6–20)
CALCIUM: 8 mg/dL — AB (ref 8.9–10.3)
CO2: 28 mmol/L (ref 22–32)
Chloride: 100 mmol/L — ABNORMAL LOW (ref 101–111)
Creatinine, Ser: 1.49 mg/dL — ABNORMAL HIGH (ref 0.44–1.00)
GFR calc Af Amer: 43 mL/min — ABNORMAL LOW (ref >60)
GFR calc non Af Amer: 37 mL/min — ABNORMAL LOW (ref >60)
GLUCOSE: 138 mg/dL — AB (ref 65–99)
POTASSIUM: 3.9 mmol/L (ref 3.5–5.1)
Sodium: 136 mmol/L (ref 135–145)

## 2016-01-26 LAB — C-REACTIVE PROTEIN: CRP: 19.2 mg/dL — AB (ref <1.0)

## 2016-01-26 MED ORDER — CEFAZOLIN SODIUM-DEXTROSE 2-3 GM-% IV SOLR: 2.0000 g | Freq: Three times a day (TID) | INTRAVENOUS | Status: DC

## 2016-01-26 MED ORDER — CEFAZOLIN SODIUM-DEXTROSE 2-3 GM-% IV SOLR
2.0000 g | Freq: Three times a day (TID) | INTRAVENOUS | Status: DC
  Administered 2016-01-26 – 2016-01-28 (×6): 2 g via INTRAVENOUS
  Filled 2016-01-26 (×9): qty 50

## 2016-01-26 MED ORDER — OXYCODONE-ACETAMINOPHEN 5-325 MG PO TABS: 1.0000 | ORAL_TABLET | ORAL | Status: DC | PRN

## 2016-01-26 MED ORDER — ASPIRIN 325 MG PO TBEC: 325.0000 mg | DELAYED_RELEASE_TABLET | Freq: Two times a day (BID) | ORAL | Status: DC

## 2016-01-26 MED ORDER — METHOCARBAMOL 500 MG PO TABS: 500.0000 mg | ORAL_TABLET | Freq: Four times a day (QID) | ORAL | Status: DC | PRN

## 2016-01-26 NOTE — Progress Notes (Signed)
Physical Therapy Treatment Patient Details Name: Ashley Shepherd MRN: [id number] DOB: [birth year] Today's Date: 01/26/2016    History of Present Illness Pt presents for infected left THA and underwent I & D as well as hardware removal and placement of Abx spacer. PMH: HIV    PT Comments    Pt progressing with gait and transfers. Pt continues to require increased time and assist with transfers. Able to complete hall ambulation and stairs. Education for HEP and increased function provided. Will continue to follow with recommendation for increased mobility with nursing assist.   Follow Up Recommendations  Home health PT;Supervision for mobility/OOB     Equipment Recommendations       Recommendations for Other Services       Precautions / Restrictions Precautions Precautions: Fall Restrictions LLE Weight Bearing: Weight bearing as tolerated    Mobility  Bed Mobility Overal bed mobility: Needs Assistance Bed Mobility: Supine to Sit     Supine to sit: Min assist;HOB elevated     General bed mobility comments: min assist for LLE, increased time, HOB 20degrees, use of rail, cues for sequence  Transfers Overall transfer level: Needs assistance   Transfers: Sit to/from Stand Sit to Stand: Min assist;From elevated surface         General transfer comment: cues for hand placement, LLE position, sequence and assist to rise from surface  Ambulation/Gait Ambulation/Gait assistance: Supervision Ambulation Distance (Feet): 200 Feet Assistive device: Rolling walker (2 wheeled) Gait Pattern/deviations: Step-to pattern;Decreased stride length;Decreased stance time - left   Gait velocity interpretation: Below normal speed for age/gender General Gait Details: cues for posture and sequence   Stairs Stairs: Yes Stairs assistance: Supervision Stair Management: Step to pattern;Forwards;Two rails Number of Stairs: 2 General stair comments: cues for sequence  Wheelchair Mobility     Modified Rankin (Stroke Patients Only)       Balance                                    Cognition Arousal/Alertness: Awake/alert Behavior During Therapy: WFL for tasks assessed/performed Overall Cognitive Status: Within Functional Limits for tasks assessed                      Exercises General Exercises - Lower Extremity Gluteal Sets: AROM;Seated;Left;15 reps Long Arc Quad: AROM;Seated;Left;15 reps Toe Raises: AROM;Seated;Left;15 reps Heel Raises: AROM;Seated;Left;15 reps    General Comments        Pertinent Vitals/Pain Faces Pain Scale: Hurts little more Pain Location: left hip spasm and with movement Pain Descriptors / Indicators: Aching Pain Intervention(s): Limited activity within patient's tolerance;Monitored during session;Premedicated before session;Repositioned    Home Living                      Prior Function            PT Goals (current goals can now be found in the care plan section) Progress towards PT goals: Progressing toward goals    Frequency       PT Plan Current plan remains appropriate    Co-evaluation             End of Session Equipment Utilized During Treatment: Gait belt Activity Tolerance: Patient tolerated treatment well Patient left: in chair;with call bell/phone within reach     Time: 0919-0946 PT Time Calculation (min) (ACUTE ONLY): 27 min  Charges:  $Gait Training: 8-22 mins $  Therapeutic Exercise: 8-22 mins                    G Codes:      Melford Aase Feb 03, 2016, 9:50 AM Elwyn Reach, [place]

## 2016-01-26 NOTE — Discharge Instructions (Signed)
You can get your current left hip dressing wet in the shower daily. Leave that dressing on until your outpatient follow-up.

## 2016-01-26 NOTE — Progress Notes (Signed)
Visited with patient earlier today to follow up with her concerns today and follow-up with her from the visit on yesterday as well. The patient was complaining that everything the staff is doing for her she should be doing for herself and not them. She also stated she does not like people bothering her and doing for her. After a lengthy conversation with her I realized patient felt like she was losing her independence and she stated i was correct and tears begin to flow down her cheeks. I offered her re-assurance and she stated she would like to have some time alone and to read her bible.  I gave patient her time alone and informed the nurse and NT to allow her that time.Marland Kitchen

## 2016-01-26 NOTE — Progress Notes (Signed)
Patient ID: Ashley Shepherd, female   DOB: [birth year], 60 y.o.   MRN: [id number] No acute changes.  Comfortable.  PICC line in.  H/H down - acute blood loss anemia from surgery, but tolerating well.  Will continue to monitor.  Creatinine improved a little.  Appreciate ID consult and recs.  Anticipate discharge to home once final organism and sensitivities obtained.

## 2016-01-26 NOTE — Progress Notes (Signed)
Pharmacy Antibiotic Note  Ashley Shepherd is a 60 y.o. 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  Pharmacy has been consulted for ancef dosing.  Wt 90.3 kg Creat 1.49, WBC 8.3. Creat cl > 30 ml/min  Plan: Ancef 2 gm IV q8h x 6 weeks.  Height: 5\' 10"  (177.8 cm) Weight: 199 lb (90.266 kg) IBW/kg (Calculated) : 68.5  Temp (24hrs), Avg:98.6 F (37 C), Min:98.1 F (36.7 C), Max:99 F (37.2 C)   Recent Labs Lab 01/25/16 0540 01/26/16 0505  WBC 11.3* 8.3  CREATININE 1.62* 1.49*    Estimated Creatinine Clearance: 49.5 mL/min (by C-G formula based on Cr of 1.49).    Allergies  Allergen Reactions  . Codeine Itching  . Ivp Dye [Iodinated Diagnostic Agents] Itching    Pt stated that she had NO problem/reaction to topical iodine/betadine solution.  . Sulfa Antibiotics Itching    Antimicrobials this admission: Vancomycin 1/31 >> 2/2 Ancef 2/2  >>   Dose adjustments this admission: none  Microbiology results: 01/24/16 L hip wound>> mod group B strep  Thank you for allowing pharmacy to be a part of this patient's care.  Eudelia Bunch, Pharm.D. [id number] 01/26/2016 4:05 PM

## 2016-01-26 NOTE — Progress Notes (Signed)
[place] for Infectious Disease    Date of Admission:  01/24/2016   Total days of antibiotics 3        Day 3 vanco           ID: Ashley Shepherd is a 60 y.o. 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 aspirin EC  325 mg Oral BID PC  . Chlorhexidine Gluconate Cloth  6 each Topical Q0600  . citalopram  20 mg Oral Daily  . docusate sodium  100 mg Oral BID  . dolutegravir  50 mg Oral BID  . emtricitabine-tenofovir AF  1 tablet Oral Daily  . maraviroc  300 mg Oral BID  . mupirocin ointment  1 application Nasal BID    Objective: Vital signs in last 24 hours: Temp:  [98.1 F (36.7 C)-99 F (37.2 C)] 98.3 F (36.8 C) (02/02 1414) Pulse Rate:  [71-87] 85 (02/02 1414) Resp:  [16-20] 20 (02/02 1414) BP: (103-133)/(52-65) 133/65 mmHg (02/02 1414) SpO2:  [97 %-100 %] 100 % (02/02 1414) Physical Exam  Constitutional:  oriented to person, place, and time. appears well-developed and well-nourished. No distress.  HENT: [place]/AT, PERRLA, no scleral icterus Mouth/Throat: Oropharynx is clear and moist. No oropharyngeal exudate.  Cardiovascular: Normal rate, regular rhythm and normal heart sounds. Exam reveals no gallop and no friction rub.  No murmur heard.  Pulmonary/Chest: Effort normal and breath sounds normal. No respiratory distress.  has no wheezes.  Neck = supple, no nuchal rigidity Abdominal: Soft. Bowel sounds are normal.  exhibits no distension. There is no tenderness.  Lymphadenopathy: no cervical adenopathy. No axillary adenopathy Neurological: alert and oriented to person, place, and time.  Skin: Skin is warm and dry. No rash noted. No erythema. Left hip is bandaged Psychiatric: a normal mood and affect.  behavior is normal.    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 Status post I and D of left hip with excision of left hip total arthroplasty and placement of antibiotic spacer. EXAM: PORTABLE PELVIS 1-2 VIEWS COMPARISON:  Most recent imaging 11/01/2014 FINDINGS: Previous left hip arthroplasty is been excised with placement of the left hip antibiotic spacer. No acute fractures seen. There is probable sclerosis of the left acetabulum around the antibiotic spacer. Air in the subcutaneous soft tissues and hip joint consistent with recent surgery. IMPRESSION: Placement of left hip antibiotic spacer post removal of left hip arthroplasty. No immediate postoperative complication. Electronically Signed   By: Jeb Levering M.D.   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 Codeine Itching  . Ivp Dye [Iodinated Diagnostic Agents] Itching    Pt stated that she had NO problem/reaction to topical iodine/betadine solution.  . Sulfa Antibiotics Itching    Discharge antibiotics: Per pharmacy protocol cefazolin  Duration: 6 wk End Date: March 16th  [place] Per Protocol:  Labs weekly while on IV antibiotics: _x_ CBC with differential _x_ CMP _x_ CRP - only last week of abtx _x_ ESR  - only last week of abtx   Fax weekly labs to [phone]  Clinic Follow Up Appt: 4-6 wk in clinic with dr. Merlinda Frederick, [facility] Cell: [phone] Pager: [phone]  01/26/2016, 3:36 PM

## 2016-01-26 NOTE — Progress Notes (Signed)
Occupational Therapy Treatment Patient Details Name: Ashley Shepherd MRN: [id number] DOB: 05-31-1956 Today's Date: 01/26/2016    History of present illness Pt presents for infected left THA and underwent I & D as well as hardware removal and placement of Abx spacer. PMH: HIV   OT comments  Pt progressing well, but slowly towards occupational therapy goals. Pt continues to require min assist for all functional transfers and LB ADLs. Reviewed energy conservation and fall prevention strategies for home use. Will continue to follow acutely.   Follow Up Recommendations  No OT follow up;Supervision/Assistance - 24 hour    Equipment Recommendations  None recommended by OT    Recommendations for Other Services      Precautions / Restrictions Precautions Precautions: Fall Restrictions Weight Bearing Restrictions: Yes LLE Weight Bearing: Weight bearing as tolerated       Mobility Bed Mobility Overal bed mobility: Needs Assistance Bed Mobility: Supine to Sit     Supine to sit: Min assist     General bed mobility comments: HOB flat, use of bedrails. Min assist to progress LLE off bed.   Transfers Overall transfer level: Needs assistance Equipment used: Rolling walker (2 wheeled) Transfers: Sit to/from Stand Sit to Stand: Min assist         General transfer comment: Min assist for boost to stand and to stabilize RW. Pt with questions about the best hand placement on surfaces - ultimately decided she needs both hands on seated surface to stand up and before sitting for maximum support.    Balance Overall balance assessment: Needs assistance Sitting-balance support: No upper extremity supported;Feet supported Sitting balance-Leahy Scale: Fair     Standing balance support: Bilateral upper extremity supported;During functional activity Standing balance-Leahy Scale: Poor Standing balance comment: Reliant on bilateral UE to maintain balance                   ADL Overall  ADL's : Needs assistance/impaired                         Toilet Transfer: Minimal assistance;Cueing for safety;Ambulation;BSC;RW Toilet Transfer Details (indicate cue type and reason): BSC over toilet, cues to feel BSC on back of legs before sitting Toileting- Clothing Manipulation and Hygiene: Min guard;Sit to/from stand   Tub/ Shower Transfer: Tub transfer;Minimal assistance;Cueing for safety;Cueing for sequencing;Ambulation;Tub bench;Rolling walker Tub/Shower Transfer Details (indicate cue type and reason): Cues for proper hand placement and use of tub bench Functional mobility during ADLs: Minimal assistance;Rolling walker General ADL Comments: Completed all functional transfers - pt still required min assist for all due to increased pain. Reports her husband will be able to provide this level of assistance at home      Vision                     Perception     Praxis      Cognition   Behavior During Therapy: [facility] for tasks assessed/performed Overall Cognitive Status: Within Functional Limits for tasks assessed                       Extremity/Trunk Assessment               Exercises     Shoulder Instructions       General Comments      Pertinent Vitals/ Pain       Pain Assessment: 0-10 Faces Pain Scale: Hurts even more Pain Location:  L hip with movement Pain Descriptors / Indicators: Aching;Sore Pain Intervention(s): Limited activity within patient's tolerance;Monitored during session;Repositioned  Home Living                                          Prior Functioning/Environment              Frequency       Progress Toward Goals  OT Goals(current goals can now be found in the care plan section)  Progress towards OT goals: Progressing toward goals  Acute Rehab OT Goals Patient Stated Goal: to go home OT Goal Formulation: With patient Time For Goal Achievement: 02/08/16 Potential to Achieve Goals:  Good ADL Goals Pt Will Perform Grooming: with supervision;standing Pt Will Transfer to Toilet: with supervision;ambulating;bedside commode Pt Will Perform Toileting - Clothing Manipulation and hygiene: with supervision;sit to/from stand Pt Will Perform Tub/Shower Transfer: Tub transfer;with supervision;ambulating;tub bench;rolling walker  Plan Discharge plan remains appropriate    Co-evaluation                 End of Session Equipment Utilized During Treatment: Gait belt;Rolling walker   Activity Tolerance Patient tolerated treatment well   Patient Left in chair;with call bell/phone within reach   Nurse Communication Mobility status        Time: [id number] OT Time Calculation (min): 33 min  Charges: OT General Charges $OT Visit: 1 Procedure OT Treatments $Self Care/Home Management : 23-37 mins  Redmond Baseman, OTR/L Pager: [phone] 01/26/2016, 3:58 PM

## 2016-01-27 LAB — BASIC METABOLIC PANEL
ANION GAP: 7 (ref 5–15)
BUN: 11 mg/dL (ref 6–20)
CO2: 28 mmol/L (ref 22–32)
Calcium: 8.2 mg/dL — ABNORMAL LOW (ref 8.9–10.3)
Chloride: 102 mmol/L (ref 101–111)
Creatinine, Ser: 1.61 mg/dL — ABNORMAL HIGH (ref 0.44–1.00)
GFR calc Af Amer: 39 mL/min — ABNORMAL LOW (ref >60)
GFR calc non Af Amer: 34 mL/min — ABNORMAL LOW (ref >60)
GLUCOSE: 154 mg/dL — AB (ref 65–99)
POTASSIUM: 3.7 mmol/L (ref 3.5–5.1)
Sodium: 137 mmol/L (ref 135–145)

## 2016-01-27 LAB — PREPARE RBC (CROSSMATCH)

## 2016-01-27 LAB — CBC
HCT: 24.4 % — ABNORMAL LOW (ref 36.0–46.0)
Hemoglobin: 8 g/dL — ABNORMAL LOW (ref 12.0–15.0)
MCH: 29.9 pg (ref 26.0–34.0)
MCHC: 32.8 g/dL (ref 30.0–36.0)
MCV: 91 fL (ref 78.0–100.0)
Platelets: 175 10*3/uL (ref 150–400)
RBC: 2.68 MIL/uL — ABNORMAL LOW (ref 3.87–5.11)
RDW: 14.7 % (ref 11.5–15.5)
WBC: 6.5 10*3/uL (ref 4.0–10.5)

## 2016-01-27 LAB — ABO/RH: ABO/RH(D): O POS

## 2016-01-27 MED ORDER — FUROSEMIDE 10 MG/ML IJ SOLN
20.0000 mg | Freq: Once | INTRAMUSCULAR | Status: AC
  Administered 2016-01-27: 20 mg via INTRAVENOUS
  Filled 2016-01-27: qty 2

## 2016-01-27 MED ORDER — SODIUM CHLORIDE 0.9 % IV SOLN
Freq: Once | INTRAVENOUS | Status: AC
  Administered 2016-01-27: 12:00:00 via INTRAVENOUS

## 2016-01-27 NOTE — Progress Notes (Signed)
Physical Therapy Treatment Patient Details Name: Ashley Shepherd MRN: [id number] DOB: May 10, 1956 Today's Date: 01/27/2016    History of Present Illness Pt presents for infected left THA and underwent I & D as well as hardware removal and placement of Abx spacer. PMH: HIV    PT Comments    Patient continues to do well with PT. Min A for transfers. Pt reports that her significant other can provide the level of assistance she needs. Continue to progress as tolerated.   Follow Up Recommendations  Home health PT;Supervision for mobility/OOB     Equipment Recommendations  None recommended by PT    Recommendations for Other Services OT consult     Precautions / Restrictions Precautions Precautions: Fall Restrictions Weight Bearing Restrictions: Yes LLE Weight Bearing: Weight bearing as tolerated    Mobility  Bed Mobility Overal bed mobility: Needs Assistance Bed Mobility: Supine to Sit     Supine to sit: Min guard     General bed mobility comments: min use of bed rails and increased time needed; HOB flat; vc for technique  Transfers Overall transfer level: Needs assistance Equipment used: Rolling walker (2 wheeled) Transfers: Sit to/from Stand Sit to Stand: Min assist Stand pivot transfers: Min guard       General transfer comment: vc for hand placement; min A for power up into standing; increased time to transition hand placement from EOB/BSC to RW  Ambulation/Gait Ambulation/Gait assistance: Min guard Ambulation Distance (Feet): 250 Feet Assistive device: Rolling walker (2 wheeled) Gait Pattern/deviations: Step-through pattern;Antalgic;Trunk flexed;Decreased stride length;Decreased step length - left;Decreased stance time - left;Decreased dorsiflexion - left Gait velocity: decreased   General Gait Details: pt with difficulty initiating L LE swing phase, L hip flexion, and L ankle dorsiflexion; vc for sequencing, position of RW, and upright posture   Stairs             Wheelchair Mobility    Modified Rankin (Stroke Patients Only)       Balance Overall balance assessment: Needs assistance Sitting-balance support: Feet supported Sitting balance-Leahy Scale: Fair     Standing balance support: Bilateral upper extremity supported Standing balance-Leahy Scale: Poor                      Cognition Arousal/Alertness: Awake/alert Behavior During Therapy: WFL for tasks assessed/performed Overall Cognitive Status: Within Functional Limits for tasks assessed                      Exercises      General Comments        Pertinent Vitals/Pain Pain Assessment: Faces Faces Pain Scale: Hurts even more Pain Location: L hip Pain Descriptors / Indicators: Sharp;Sore Pain Intervention(s): Limited activity within patient's tolerance;Monitored during session;Repositioned    Home Living                      Prior Function            PT Goals (current goals can now be found in the care plan section) Acute Rehab PT Goals Patient Stated Goal: to go home Progress towards PT goals: Progressing toward goals    Frequency  Min 5X/week    PT Plan Current plan remains appropriate    Co-evaluation             End of Session Equipment Utilized During Treatment: Gait belt Activity Tolerance: Patient tolerated treatment well Patient left: in chair;with call bell/phone within reach  Time: [id number] PT Time Calculation (min) (ACUTE ONLY): 50 min  Charges:  $Gait Training: 23-37 mins $Therapeutic Activity: 23-37 mins                    G Codes:      Salina April, PTA Pager: [phone]   01/27/2016, 3:12 PM

## 2016-01-27 NOTE — Discharge Summary (Signed)
Patient ID: Ashley Shepherd MRN: [id number] DOB/AGE: 1956/07/04 60 y.o.  Admit date: 01/24/2016 Discharge date: 01/27/2016  Admission Diagnoses:  Principal Problem:   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[place]) Active Problems:   Infection of prosthetic total hip joint [facility])   Discharge Diagnoses:  Same  Past Medical History  Diagnosis Date  . HIV disease ([place])   . Cancer ([place])     7 years ago, cancer free now  . 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 Arthritis   . GERD (gastroesophageal reflux disease)   . History of kidney stones   . History of transfusion   . Anemia   . Difficulty sleeping   . Anxiety   . Pneumonia   . Depression   . Kidney stones     Surgeries: Procedure(s): IRRIGATION AND DEBRIDEMENT LEFT HIP EXCISION OF LEFT TOTAL HIP ARTHROPLASTY WITH PLACEMENT OF ANTIBIOTIC SPACERS on 01/24/2016   Consultants:    Discharged Condition: Improved  Hospital Course: Ashley Shepherd is an 60 y.o. female who was admitted 01/24/2016 for operative treatment of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[place]). Patient has severe unremitting pain that affects sleep, daily activities, and work/hobbies. After pre-op clearance the patient was taken to the operating room on 01/24/2016 and underwent  Procedure(s): IRRIGATION AND DEBRIDEMENT LEFT HIP EXCISION OF LEFT TOTAL HIP ARTHROPLASTY WITH PLACEMENT OF ANTIBIOTIC SPACERS.    Patient was given perioperative antibiotics: Anti-infectives    Start     Dose/Rate Route Frequency Ordered Stop   01/26/16 1600  ceFAZolin (ANCEF) IVPB 2 g/50 mL premix     2 g 100 mL/hr over 30 Minutes Intravenous 3 times per day 01/26/16 1557     01/26/16 0000  ceFAZolin (ANCEF) 2-3 GM-% SOLR     2 g 100 mL/hr over 30 Minutes Intravenous Every 8 hours 01/26/16 1737     01/25/16 2200  vancomycin (VANCOCIN) IVPB 1000 mg/200 mL premix  Status:  Discontinued     1,000 mg 200 mL/hr over 60 Minutes Intravenous Every 12 hours 01/25/16 1100 01/26/16 1536   01/25/16 2200  maraviroc (SELZENTRY) tablet 300 mg     300 mg Oral 2 times daily 01/25/16 1155     01/25/16 1000  emtricitabine-tenofovir AF (DESCOVY) 200-25 MG per tablet 1 tablet     1 tablet Oral Daily 01/24/16 2242     01/25/16 0200  vancomycin (VANCOCIN) IVPB 1000 mg/200 mL premix  Status:  Discontinued     1,000 mg 200 mL/hr over 60 Minutes Intravenous Every 8 hours 01/24/16 2248 01/25/16 1100   01/24/16 2300  dolutegravir (TIVICAY) tablet 50 mg     50 mg Oral 2 times daily 01/24/16 2242     01/24/16 2300  maraviroc (SELZENTRY) tablet 600 mg  Status:  Discontinued     600 mg Oral 2 times daily 01/24/16 2242 01/25/16 1155   01/24/16 1846  vancomycin (VANCOCIN) powder  Status:  Discontinued       As needed 01/24/16 1847 01/24/16 2029       Patient was given sequential compression devices, early ambulation, and chemoprophylaxis to prevent DVT.  Patient benefited maximally from hospital stay and there were no complications.    Final cultures grew strep species and antibiotics were adjusted accordingly. Did receive a transfusion prior to discharge for acute blood loss anemia.  Recent vital signs: Patient Vitals for the past 24 hrs:  BP Temp Temp src Pulse Resp SpO2  01/26/16 1414 133/65 mmHg  98.3 F (36.8 C) Oral 85 20 100 %     Recent laboratory studies:  Recent Labs  01/26/16 0505 01/27/16 0550  WBC 8.3 6.5  HGB 8.8* 8.0*  HCT 26.7* 24.4*  PLT 183 175  NA 136 137  K 3.9 3.7  CL 100* 102  CO2 28 28  BUN 18 11  CREATININE 1.49* 1.61*  GLUCOSE 138* 154*  CALCIUM 8.0* 8.2*     Discharge Medications:     Medication List    TAKE these medications        ALPRAZolam 1 MG tablet  Commonly known as:  XANAX  Take 1 mg by mouth at bedtime as needed for anxiety.     aspirin 325 MG EC tablet  Take 1 tablet (325 mg total) by mouth 2 (two) times daily after a meal.     ceFAZolin 2-3 GM-% Solr  Commonly known as:  ANCEF  Inject 50 mLs (2 g total) into the vein every  8 (eight) hours.     citalopram 20 MG tablet  Commonly known as:  CELEXA  Take 20 mg by mouth daily.     DESCOVY 200-25 MG tablet  Generic drug:  emtricitabine-tenofovir AF  Take 1 tablet by mouth daily.     ferrous sulfate 325 (65 FE) MG tablet  Take 1 tablet (325 mg total) by mouth 3 (three) times daily with meals.     maraviroc 300 MG tablet  Commonly known as:  SELZENTRY  Take 300 mg by mouth 2 (two) times daily. Reported on 01/24/2016     methocarbamol 500 MG tablet  Commonly known as:  ROBAXIN  Take 1 tablet (500 mg total) by mouth every 6 (six) hours as needed for muscle spasms.     oxyCODONE-acetaminophen 5-325 MG tablet  Commonly known as:  ROXICET  Take 1-2 tablets by mouth every 4 (four) hours as needed.     promethazine 25 MG tablet  Commonly known as:  PHENERGAN  Take 25 tablets by mouth every 6 (six) hours as needed for nausea or vomiting.     TIVICAY 50 MG tablet  Generic drug:  dolutegravir  Take 50 mg by mouth 2 (two) times daily.        Diagnostic Studies: Dg Pelvis Portable  01/24/2016  CLINICAL DATA:  Infection of left prosthetic total hip joint, initial encounter. Status post I and D of left hip with excision of left hip total arthroplasty and placement of antibiotic spacer. EXAM: PORTABLE PELVIS 1-2 VIEWS COMPARISON:  Most recent imaging 11/01/2014 FINDINGS: Previous left hip arthroplasty is been excised with placement of the left hip antibiotic spacer. No acute fractures seen. There is probable sclerosis of the left acetabulum around the antibiotic spacer. Air in the subcutaneous soft tissues and hip joint consistent with recent surgery. IMPRESSION: Placement of left hip antibiotic spacer post removal of left hip arthroplasty. No immediate postoperative complication. Electronically Signed   By: Jeb Levering M.D.   On: 01/24/2016 21:11    Disposition: 06-Home-Health Care Svc      Discharge Instructions    Discharge patient    Complete by:  As  directed            Follow-up Information    Follow up with [facility].   Why:  They will contact you to schedule home therapy and nurse visits.   Contact information:   [address] [place] [address] [phone]       Follow  up with Mcarthur Rossetti, MD. Schedule an appointment as soon as possible for a visit in 2 weeks.   Specialty:  Orthopedic Surgery   Contact information:   [place] [address] [phone]        Signed: Mcarthur Rossetti 01/27/2016, 6:46 AM

## 2016-01-27 NOTE — Progress Notes (Signed)
[facility]  Patient Status: New pt for [facility] this adimission  [facility] is providing the following services: HHRN and Home Infusion Pharmacy team for home IV ABX.   If patient discharges after hours, please call [phone].   Ashley Shepherd 01/27/2016, 12:45 PM

## 2016-01-27 NOTE — Progress Notes (Signed)
Occupational Therapy Treatment Patient Details Name: LABRINA KEIGHER MRN: [id number] DOB: [birth year] Today's Date: 01/27/2016    History of present illness Pt presents for infected left THA and underwent I & D as well as hardware removal and placement of Abx spacer. PMH: HIV   OT comments  Pt. Progressing well with acute OT goals.  Able to complete bed mobility, toileting, and light grooming and ADL tasks.  D/c set for later today.  Significant other present and reports he will be assisting pt. As needed.  Follow Up Recommendations  No OT follow up;Supervision/Assistance - 24 hour    Equipment Recommendations  None recommended by OT    Recommendations for Other Services      Precautions / Restrictions Precautions Precautions: Fall Restrictions Weight Bearing Restrictions: Yes LLE Weight Bearing: Weight bearing as tolerated       Mobility Bed Mobility Overal bed mobility: Needs Assistance Bed Mobility: Supine to Sit     Supine to sit: Mod assist     General bed mobility comments: HOB flat, no use of rails, mod a to bring b les off of bed with cues for pt. to push up through b ues to provide support into sitting  Transfers Overall transfer level: Needs assistance Equipment used: Rolling walker (2 wheeled) Transfers: Sit to/from [facility] Sit to Stand: Min guard Stand pivot transfers: Min guard       General transfer comment: pt. slow to push through b ues cues for hand placement and encouragement for her to attempt and complete on her own.  once she pushes through B UES she is able to maintain good upright posture and amb.     Balance                                   ADL Overall ADL's : Needs assistance/impaired     Grooming: Set up;Sitting;Wash/dry hands;Wash/dry face   Upper Body Bathing: Set up;Sitting   Lower Body Bathing: Set up;Sitting/lateral leans   Upper Body Dressing : Set up       Toilet Transfer: Min guard;Cueing  for sequencing;Ambulation;Regular Toilet;BSC;RW   Toileting- Water quality scientist and Hygiene: Min guard;Sit to/from stand       Functional mobility during ADLs: Rolling walker;Min guard General ADL Comments: Completed all functional transfers,  Reports her husband will be able to provide this level of assistance at home      Vision                     Perception     Praxis      Cognition   Behavior During Therapy: [facility] for tasks assessed/performed Overall Cognitive Status: Within Functional Limits for tasks assessed                       Extremity/Trunk Assessment               Exercises     Shoulder Instructions       General Comments      Pertinent Vitals/ Pain       Pain Assessment:  (did not rate but grimaced with initial movement of LE)  Home Living  Prior Functioning/Environment              Frequency Min 2X/week     Progress Toward Goals  OT Goals(current goals can now be found in the care plan section)  Progress towards OT goals: Progressing toward goals     Plan Discharge plan remains appropriate    Co-evaluation                 End of Session Equipment Utilized During Treatment: Gait belt;Rolling walker   Activity Tolerance Patient tolerated treatment well   Patient Left in bed;with call bell/phone within reach;with family/visitor present   Nurse Communication  (recorded vitals for rn as pt. was hooked to machine upon arrival into room)        Time: 1130-1200 OT Time Calculation (min): 30 min  Charges: OT General Charges $OT Visit: 1 Procedure OT Treatments $Self Care/Home Management : 23-37 mins  Janice Coffin, COTA/L 01/27/2016, 12:25 PM

## 2016-01-27 NOTE — Progress Notes (Signed)
Patient ID: Ashley Shepherd, female   DOB: 1956-07-09, 60 y.o.   MRN: [id number] Doing well.  Cultures grew out strep species.  IV antibiotics changed.  Hgb down to 8.0.  Can discharge to home today, but will transfuse blood prior to discharge due to anemia affecting creatinine.

## 2016-01-28 DIAGNOSIS — N183 Chronic kidney disease, stage 3 unspecified: Secondary | ICD-10-CM | POA: Insufficient documentation

## 2016-01-28 DIAGNOSIS — B20 Human immunodeficiency virus [HIV] disease: Secondary | ICD-10-CM | POA: Insufficient documentation

## 2016-01-28 LAB — TYPE AND SCREEN
ABO/RH(D): O POS
Antibody Screen: NEGATIVE
Unit division: 0
Unit division: 0

## 2016-01-28 NOTE — Progress Notes (Signed)
Discharge instructions given. Pt verbalized understanding and all questions were answered.  

## 2016-01-28 NOTE — Progress Notes (Signed)
Patient ID: Ashley Shepherd, female   DOB: 1956/05/06, 60 y.o.   MRN: [id number] Doing very well.  Received transfusion yesterday.  Vitals stable.  Left hip stable.  Can be discharged to home today.

## 2016-01-29 LAB — ANAEROBIC CULTURE

## 2016-02-15 ENCOUNTER — Telehealth: Payer: Self-pay | Admitting: *Deleted

## 2016-02-15 NOTE — Telephone Encounter (Signed)
Call from [place] with [facility] who has an upcoming appt with Dr. Baxter Flattery 3.14.17; she has been discharged because she is currently incarcerated. Myrtis Hopping

## 2016-03-06 ENCOUNTER — Encounter: Payer: Self-pay | Admitting: Internal Medicine

## 2016-03-06 ENCOUNTER — Ambulatory Visit (INDEPENDENT_AMBULATORY_CARE_PROVIDER_SITE_OTHER): Payer: Medicare Other | Admitting: Internal Medicine

## 2016-03-06 VITALS — BP 152/93 | HR 90 | Temp 98.4°F | Wt 191.0 lb

## 2016-03-06 DIAGNOSIS — T8450XS Infection and inflammatory reaction due to unspecified internal joint prosthesis, sequela: Secondary | ICD-10-CM | POA: Diagnosis present

## 2016-03-06 LAB — C-REACTIVE PROTEIN: CRP: 3.2 mg/dL — ABNORMAL HIGH (ref <0.60)

## 2016-03-06 NOTE — Progress Notes (Signed)
RFV: prosthetic joint infection of left hip s/p abtx spacer Subjective:    Patient ID: Ashley Shepherd, female    DOB: 1956-03-27, 60 y.o.   MRN: [id number]  HPI  60yo F with hiv disease, found to have prosthetic joint infection of left hip. Previously placed for AVN. She underwent prosthesis resection and abtx spacer placement on 2/7 and placed on 6 wk of IV cefazolin for treatment of group B strep. She is doing well and finishes abtx on 3/14.   meds reviewed  Active Ambulatory Problems    Diagnosis Date Noted  . FB GI (foreign body in gastrointestinal tract) 04/09/2014  . Avascular necrosis of bone of left hip ([place]) 10/01/2014  . 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[place]) 01/24/2016  . 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[place]) 01/24/2016  . HIV disease ([place])   . CKD (chronic kidney disease) stage 3, GFR 30-59 ml/min    Resolved Ambulatory Problems    Diagnosis Date Noted  . No Resolved Ambulatory Problems   Past Medical History  Diagnosis Date  . Cancer ([place])   . 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[place])   . Arthritis   . GERD (gastroesophageal reflux disease)   . History of kidney stones   . History of transfusion   . Anemia   . Difficulty sleeping   . Anxiety   . Pneumonia   . Depression   . Kidney stones       Review of Systems Review of Systems  Constitutional: Negative for fever, chills, diaphoresis, activity change, appetite change, fatigue and unexpected weight change.  HENT: Negative for congestion, sore throat, rhinorrhea, sneezing, trouble swallowing and sinus pressure.  Eyes: Negative for photophobia and visual disturbance.  Respiratory: Negative for cough, chest tightness, shortness of breath, wheezing and stridor.  Cardiovascular: Negative for chest pain, palpitations and leg swelling.  Gastrointestinal: Negative for nausea, vomiting, abdominal pain, diarrhea, constipation, blood in stool, abdominal  distention and anal bleeding.  Genitourinary: Negative for dysuria, hematuria, flank pain and difficulty urinating.  Musculoskeletal: Negative for myalgias, back pain, joint swelling, arthralgias and gait problem.  Skin: Negative for color change, pallor, rash and wound.  Neurological: Negative for dizziness, tremors, weakness and light-headedness.  Hematological: Negative for adenopathy. Does not bruise/bleed easily.  Psychiatric/Behavioral: Negative for behavioral problems, confusion, sleep disturbance, dysphoric mood, decreased concentration and agitation.       Objective:   Physical Exam BP 152/93 mmHg  Pulse 90  Temp(Src) 98.4 F (36.9 C) (Oral)  Wt 191 lb (86.637 kg) Physical Exam  Constitutional:  oriented to person, place, and time. appears well-developed and well-nourished. No distress.  HENT: [place]/AT, PERRLA, no scleral icterus, Mouth/Throat: Oropharynx is clear and moist. No oropharyngeal exudate. edentulous Cardiovascular: Normal rate, regular rhythm and normal heart sounds. Exam reveals no gallop and no friction rub.  No murmur heard.  Pulmonary/Chest: Effort normal and breath sounds normal. No respiratory distress.  has no wheezes.  Abdominal: Soft. Bowel sounds are normal.  exhibits no distension. There is no tenderness.  Skin: Skin is warm and dry. No rash noted. Surgical incision is well healed. No erythema  Lab Results  Component Value Date   ESRSEDRATE 87* 03/06/2016   Lab Results  Component Value Date   CRP 3.2* 03/06/2016        Assessment & Plan:  hiv disease =previously well controlled. We will check hiv and cd 4 count. She is currently on tivicay,taf,  and maraviroc  Group b strep pji = finish cefazolin on 3/14. We will check inflammatory markers. Which are elevated. usuallyt 6 wk should be sufficient for treatment. Will reach out to orthopedics surgery to see if can tap joint to see if any presence of infection

## 2016-03-07 LAB — SEDIMENTATION RATE: SED RATE: 87 mm/h — AB (ref 0–30)

## 2016-03-08 ENCOUNTER — Telehealth: Payer: Self-pay | Admitting: *Deleted

## 2016-03-08 NOTE — Telephone Encounter (Signed)
Patient called for her lab results. [facility] called patient stating they were sending her more IV antibiotics. Per Dr. Baxter Flattery she will look at labs and call patient. [facility] will need to be notified. Myrtis Hopping

## 2016-03-08 NOTE — Telephone Encounter (Signed)
i have called [facility], spoke with lynn, gave verbal to extend iv abtx by 2 more weeks

## 2016-03-28 ENCOUNTER — Other Ambulatory Visit: Payer: Self-pay | Admitting: Orthopaedic Surgery

## 2016-03-28 NOTE — Patient Instructions (Addendum)
ALYANNA KEIMIG  03/28/2016   Your procedure is scheduled on: 04/06/2016    Report to [facility] Main  Entrance take [place]  elevators to 3rd floor to  [place] at     1130 AM.  Call this number if you have problems the morning of surgery [phone]   Remember: ONLY 1 PERSON MAY GO WITH YOU TO SHORT STAY TO GET  READY MORNING OF [place].  Do not eat food after midnite .  May have clear liquids from 12 midnite until 0700am then nothing by mouth.       Take these medicines the morning of surgery with A SIP OF WATER: Celesa, Oxycodone if needed, Maraviroc (selentry), Tivicay, Descovy                                You may not have any metal on your body including hair pins and              piercings  Do not wear jewelry, make-up, lotions, powders or perfumes, deodorant             Do not wear nail polish.  Do not shave  48 hours prior to surgery.                Do not bring valuables to the hospital. [place].  Contacts, dentures or bridgework may not be worn into surgery.  Leave suitcase in the car. After surgery it may be brought to your room.         Special Instructions:coughing and deep breathing exercises, leg exercises               Please read over the following fact sheets you were given: _____________________________________________________________________             [facility] - Preparing for Surgery Before surgery, you can play an important role.  Because skin is not sterile, your skin needs to be as free of germs as possible.  You can reduce the number of germs on your skin by washing with CHG (chlorahexidine gluconate) soap before surgery.  CHG is an antiseptic cleaner which kills germs and bonds with the skin to continue killing germs even after washing. Please DO NOT use if you have an allergy to CHG or antibacterial soaps.  If your skin becomes reddened/irritated stop using the  CHG and inform your nurse when you arrive at Short Stay. Do not shave (including legs and underarms) for at least 48 hours prior to the first CHG shower.  You may shave your face/neck. Please follow these instructions carefully:  1.  Shower with CHG Soap the night before surgery and the  morning of Surgery.  2.  If you choose to wash your hair, wash your hair first as usual with your  normal  shampoo.  3.  After you shampoo, rinse your hair and body thoroughly to remove the  shampoo.                           4.  Use CHG as you would any other liquid soap.  You can apply chg directly  to the skin and wash  Gently with a scrungie or clean washcloth.  5.  Apply the CHG Soap to your body ONLY FROM THE NECK DOWN.   Do not use on face/ open                           Wound or open sores. Avoid contact with eyes, ears mouth and genitals (private parts).                       Wash face,  Genitals (private parts) with your normal soap.             6.  Wash thoroughly, paying special attention to the area where your surgery  will be performed.  7.  Thoroughly rinse your body with warm water from the neck down.  8.  DO NOT shower/wash with your normal soap after using and rinsing off  the CHG Soap.                9.  Pat yourself dry with a clean towel.            10.  Wear clean pajamas.            11.  Place clean sheets on your bed the night of your first shower and do not  sleep with pets. Day of Surgery : Do not apply any lotions/deodorants the morning of surgery.  Please wear clean clothes to the hospital/surgery center.  FAILURE TO FOLLOW THESE INSTRUCTIONS MAY RESULT IN THE CANCELLATION OF YOUR SURGERY PATIENT SIGNATURE_________________________________  NURSE SIGNATURE__________________________________  ________________________________________________________________________  WHAT IS A BLOOD TRANSFUSION? Blood Transfusion Information  A transfusion is the replacement of  blood or some of its parts. Blood is made up of multiple cells which provide different functions.  Red blood cells carry oxygen and are used for blood loss replacement.  White blood cells fight against infection.  Platelets control bleeding.  Plasma helps clot blood.  Other blood products are available for specialized needs, such as hemophilia or other clotting disorders. BEFORE THE TRANSFUSION  Who gives blood for transfusions?   Healthy volunteers who are fully evaluated to make sure their blood is safe. This is blood bank blood. Transfusion therapy is the safest it has ever been in the practice of medicine. Before blood is taken from a donor, a complete history is taken to make sure that person has no history of diseases nor engages in risky social behavior (examples are intravenous drug use or sexual activity with multiple partners). The donor's travel history is screened to minimize risk of transmitting infections, such as malaria. The donated blood is tested for signs of infectious diseases, such as HIV and hepatitis. The blood is then tested to be sure it is compatible with you in order to minimize the chance of a transfusion reaction. If you or a relative donates blood, this is often done in anticipation of surgery and is not appropriate for emergency situations. It takes many days to process the donated blood. RISKS AND COMPLICATIONS Although transfusion therapy is very safe and saves many lives, the main dangers of transfusion include:   Getting an infectious disease.  Developing a transfusion reaction. This is an allergic reaction to something in the blood you were given. Every precaution is taken to prevent this. The decision to have a blood transfusion has been considered carefully by your caregiver before blood is given. Blood is not given unless the benefits outweigh  the risks. AFTER THE TRANSFUSION  Right after receiving a blood transfusion, you will usually feel much better  and more energetic. This is especially true if your red blood cells have gotten low (anemic). The transfusion raises the level of the red blood cells which carry oxygen, and this usually causes an energy increase.  The nurse administering the transfusion will monitor you carefully for complications. HOME CARE INSTRUCTIONS  No special instructions are needed after a transfusion. You may find your energy is better. Speak with your caregiver about any limitations on activity for underlying diseases you may have. SEEK MEDICAL CARE IF:   Your condition is not improving after your transfusion.  You develop redness or irritation at the intravenous (IV) site. SEEK IMMEDIATE MEDICAL CARE IF:  Any of the following symptoms occur over the next 12 hours:  Shaking chills.  You have a temperature by mouth above 102 F (38.9 C), not controlled by medicine.  Chest, back, or muscle pain.  People around you feel you are not acting correctly or are confused.  Shortness of breath or difficulty breathing.  Dizziness and fainting.  You get a rash or develop hives.  You have a decrease in urine output.  Your urine turns a dark color or changes to pink, red, or brown. Any of the following symptoms occur over the next 10 days:  You have a temperature by mouth above 102 F (38.9 C), not controlled by medicine.  Shortness of breath.  Weakness after normal activity.  The white part of the eye turns yellow (jaundice).  You have a decrease in the amount of urine or are urinating less often.  Your urine turns a dark color or changes to pink, red, or brown. Document Released: 12/07/2000 Document Revised: 03/03/2012 Document Reviewed: 07/26/2008 ExitCare Patient Information 2014 ExitCare, [place].  _______________________________________________________________________   CLEAR LIQUID DIET   Foods Allowed                                                                     Foods Excluded  Coffee  and tea, regular and decaf                             liquids that you cannot  Plain Jell-O in any flavor                                             see through such as: Fruit ices (not with fruit pulp)                                     milk, soups, orange juice  Iced Popsicles                                    All solid food Carbonated beverages, regular and diet  Cranberry, grape and apple juices Sports drinks like Gatorade Lightly seasoned clear broth or consume(fat free) Sugar, honey syrup  Sample Menu Breakfast                                Lunch                                     Supper Cranberry juice                    Beef broth                            Chicken broth Jell-O                                     Grape juice                           Apple juice Coffee or tea                        Jell-O                                      Popsicle                                                Coffee or tea                        Coffee or tea  _____________________________________________________________________

## 2016-03-29 ENCOUNTER — Encounter (HOSPITAL_COMMUNITY): Payer: Self-pay

## 2016-03-29 ENCOUNTER — Encounter (HOSPITAL_COMMUNITY)
Admission: RE | Admit: 2016-03-29 | Discharge: 2016-03-29 | Disposition: A | Discharge status: Home / Self Care | Payer: Medicare Other | Source: Ambulatory Visit | Attending: Orthopaedic Surgery | Admitting: Orthopaedic Surgery

## 2016-03-29 DIAGNOSIS — B20 Human immunodeficiency virus [HIV] disease: Secondary | ICD-10-CM | POA: Diagnosis not present

## 2016-03-29 DIAGNOSIS — Z01812 Encounter for preprocedural laboratory examination: Secondary | ICD-10-CM | POA: Diagnosis present

## 2016-03-29 DIAGNOSIS — F17210 Nicotine dependence, cigarettes, uncomplicated: Secondary | ICD-10-CM | POA: Diagnosis not present

## 2016-03-29 DIAGNOSIS — Z91041 Radiographic dye allergy status: Secondary | ICD-10-CM | POA: Insufficient documentation

## 2016-03-29 DIAGNOSIS — Z882 Allergy status to sulfonamides status: Secondary | ICD-10-CM | POA: Insufficient documentation

## 2016-03-29 DIAGNOSIS — N183 Chronic kidney disease, stage 3 (moderate): Secondary | ICD-10-CM | POA: Insufficient documentation

## 2016-03-29 DIAGNOSIS — M25552 Pain in left hip: Secondary | ICD-10-CM | POA: Insufficient documentation

## 2016-03-29 DIAGNOSIS — M879 Osteonecrosis, unspecified: Secondary | ICD-10-CM | POA: Insufficient documentation

## 2016-03-29 DIAGNOSIS — Z885 Allergy status to narcotic agent status: Secondary | ICD-10-CM | POA: Insufficient documentation

## 2016-03-29 LAB — CBC
HCT: 40.9 % (ref 36.0–46.0)
Hemoglobin: 13.7 g/dL (ref 12.0–15.0)
MCH: 29.3 pg (ref 26.0–34.0)
MCHC: 33.5 g/dL (ref 30.0–36.0)
MCV: 87.4 fL (ref 78.0–100.0)
PLATELETS: 216 10*3/uL (ref 150–400)
RBC: 4.68 MIL/uL (ref 3.87–5.11)
RDW: 14.9 % (ref 11.5–15.5)
WBC: 7 10*3/uL (ref 4.0–10.5)

## 2016-03-29 LAB — COMPREHENSIVE METABOLIC PANEL
ALT: 7 U/L — AB (ref 14–54)
AST: 19 U/L (ref 15–41)
Albumin: 3.9 g/dL (ref 3.5–5.0)
Alkaline Phosphatase: 158 U/L — ABNORMAL HIGH (ref 38–126)
Anion gap: 10 (ref 5–15)
BILIRUBIN TOTAL: 0.4 mg/dL (ref 0.3–1.2)
BUN: 13 mg/dL (ref 6–20)
CALCIUM: 9.8 mg/dL (ref 8.9–10.3)
CO2: 29 mmol/L (ref 22–32)
CREATININE: 1.25 mg/dL — AB (ref 0.44–1.00)
Chloride: 105 mmol/L (ref 101–111)
GFR, EST AFRICAN AMERICAN: 53 mL/min — AB (ref >60)
GFR, EST NON AFRICAN AMERICAN: 46 mL/min — AB (ref >60)
Glucose, Bld: 115 mg/dL — ABNORMAL HIGH (ref 65–99)
Potassium: 4.6 mmol/L (ref 3.5–5.1)
Sodium: 144 mmol/L (ref 135–145)
TOTAL PROTEIN: 8.3 g/dL — AB (ref 6.5–8.1)

## 2016-03-29 LAB — SURGICAL PCR SCREEN
MRSA, PCR: POSITIVE — AB
Staphylococcus aureus: POSITIVE — AB

## 2016-03-29 LAB — PROTIME-INR
INR: 1.16 (ref 0.00–1.49)
Prothrombin Time: 14.5 seconds (ref 11.6–15.2)

## 2016-03-29 NOTE — Progress Notes (Signed)
03-15-16 - LOV - Dr. Darcus Pester (int.med.) [place] 03-06-16 - LOV - Dr. Baxter Flattery (Inf. Dis.) - [facility]

## 2016-03-29 NOTE — Progress Notes (Signed)
03-29-16 - PCR screen lab results from preop visit on 03-29-16 faxed to Dr. Ninfa Linden via [facility]

## 2016-04-06 ENCOUNTER — Inpatient Hospital Stay (HOSPITAL_COMMUNITY): Payer: Medicare Other

## 2016-04-06 ENCOUNTER — Inpatient Hospital Stay (HOSPITAL_COMMUNITY)
Admission: RE | Admit: 2016-04-06 | Discharge: 2016-04-09 | DRG: 467 | Disposition: A | Discharge status: Home Health | Payer: Medicare Other | Source: Ambulatory Visit | Attending: Orthopaedic Surgery | Admitting: Orthopaedic Surgery

## 2016-04-06 ENCOUNTER — Inpatient Hospital Stay (HOSPITAL_COMMUNITY): Payer: Medicare Other | Admitting: Anesthesiology

## 2016-04-06 ENCOUNTER — Encounter (HOSPITAL_COMMUNITY): Payer: Self-pay | Admitting: *Deleted

## 2016-04-06 ENCOUNTER — Encounter (HOSPITAL_COMMUNITY)
Admission: RE | Disposition: A | Discharge status: Home Health | Payer: Self-pay | Source: Ambulatory Visit | Attending: Orthopaedic Surgery

## 2016-04-06 DIAGNOSIS — Z21 Asymptomatic human immunodeficiency virus [HIV] infection status: Secondary | ICD-10-CM | POA: Diagnosis present

## 2016-04-06 DIAGNOSIS — M1612 Unilateral primary osteoarthritis, left hip: Secondary | ICD-10-CM | POA: Diagnosis present

## 2016-04-06 DIAGNOSIS — Z833 Family history of diabetes mellitus: Secondary | ICD-10-CM

## 2016-04-06 DIAGNOSIS — M879 Osteonecrosis, unspecified: Secondary | ICD-10-CM | POA: Diagnosis present

## 2016-04-06 DIAGNOSIS — T8452XA Infection and inflammatory reaction due to internal left hip prosthesis, initial encounter: Secondary | ICD-10-CM | POA: Diagnosis present

## 2016-04-06 DIAGNOSIS — Z419 Encounter for procedure for purposes other than remedying health state, unspecified: Secondary | ICD-10-CM

## 2016-04-06 DIAGNOSIS — D631 Anemia in chronic kidney disease: Secondary | ICD-10-CM | POA: Diagnosis present

## 2016-04-06 DIAGNOSIS — K219 Gastro-esophageal reflux disease without esophagitis: Secondary | ICD-10-CM | POA: Diagnosis present

## 2016-04-06 DIAGNOSIS — Z91041 Radiographic dye allergy status: Secondary | ICD-10-CM

## 2016-04-06 DIAGNOSIS — Z885 Allergy status to narcotic agent status: Secondary | ICD-10-CM | POA: Diagnosis not present

## 2016-04-06 DIAGNOSIS — M25552 Pain in left hip: Secondary | ICD-10-CM | POA: Diagnosis present

## 2016-04-06 DIAGNOSIS — N183 Chronic kidney disease, stage 3 (moderate): Secondary | ICD-10-CM | POA: Diagnosis present

## 2016-04-06 DIAGNOSIS — F17210 Nicotine dependence, cigarettes, uncomplicated: Secondary | ICD-10-CM | POA: Diagnosis present

## 2016-04-06 DIAGNOSIS — Z859 Personal history of malignant neoplasm, unspecified: Secondary | ICD-10-CM | POA: Diagnosis not present

## 2016-04-06 DIAGNOSIS — Y831 Surgical operation with implant of artificial internal device as the cause of abnormal reaction of the patient, or of later complication, without mention of misadventure at the time of the procedure: Secondary | ICD-10-CM | POA: Diagnosis present

## 2016-04-06 DIAGNOSIS — F419 Anxiety disorder, unspecified: Secondary | ICD-10-CM | POA: Diagnosis present

## 2016-04-06 DIAGNOSIS — Z96649 Presence of unspecified artificial hip joint: Secondary | ICD-10-CM

## 2016-04-06 DIAGNOSIS — Z87442 Personal history of urinary calculi: Secondary | ICD-10-CM

## 2016-04-06 DIAGNOSIS — Z96642 Presence of left artificial hip joint: Secondary | ICD-10-CM

## 2016-04-06 DIAGNOSIS — Z96643 Presence of artificial hip joint, bilateral: Secondary | ICD-10-CM

## 2016-04-06 DIAGNOSIS — Z882 Allergy status to sulfonamides status: Secondary | ICD-10-CM

## 2016-04-06 DIAGNOSIS — Z8249 Family history of ischemic heart disease and other diseases of the circulatory system: Secondary | ICD-10-CM

## 2016-04-06 HISTORY — PX: ANTERIOR HIP REVISION: SHX6527

## 2016-04-06 LAB — TYPE AND SCREEN
ABO/RH(D): O POS
Antibody Screen: NEGATIVE

## 2016-04-06 SURGERY — REVISION, TOTAL ARTHROPLASTY, HIP, ANTERIOR APPROACH
Anesthesia: General | Site: Hip | Laterality: Left

## 2016-04-06 MED ORDER — HYDROMORPHONE HCL 1 MG/ML IJ SOLN
INTRAMUSCULAR | Status: AC
  Filled 2016-04-06: qty 1

## 2016-04-06 MED ORDER — DEXAMETHASONE SODIUM PHOSPHATE 10 MG/ML IJ SOLN: INTRAMUSCULAR | Status: DC | PRN

## 2016-04-06 MED ORDER — ASPIRIN EC 325 MG PO TBEC
325.0000 mg | DELAYED_RELEASE_TABLET | Freq: Two times a day (BID) | ORAL | Status: DC
  Administered 2016-04-06 – 2016-04-09 (×6): 325 mg via ORAL
  Filled 2016-04-06 (×8): qty 1

## 2016-04-06 MED ORDER — METOCLOPRAMIDE HCL 5 MG/ML IJ SOLN
5.0000 mg | Freq: Three times a day (TID) | INTRAMUSCULAR | Status: DC | PRN
  Administered 2016-04-07: 10 mg via INTRAVENOUS
  Filled 2016-04-06: qty 2

## 2016-04-06 MED ORDER — MIDAZOLAM HCL 5 MG/5ML IJ SOLN
INTRAMUSCULAR | Status: DC | PRN
  Administered 2016-04-06: 2 mg via INTRAVENOUS

## 2016-04-06 MED ORDER — ROCURONIUM BROMIDE 100 MG/10ML IV SOLN
INTRAVENOUS | Status: DC | PRN
  Administered 2016-04-06: 40 mg via INTRAVENOUS
  Administered 2016-04-06 (×2): 10 mg via INTRAVENOUS

## 2016-04-06 MED ORDER — ONDANSETRON HCL 4 MG/2ML IJ SOLN
INTRAMUSCULAR | Status: AC
  Filled 2016-04-06: qty 2

## 2016-04-06 MED ORDER — METOCLOPRAMIDE HCL 10 MG PO TABS: 5.0000 mg | ORAL_TABLET | Freq: Three times a day (TID) | ORAL | Status: DC | PRN

## 2016-04-06 MED ORDER — SODIUM CHLORIDE 0.9 % IV SOLN
INTRAVENOUS | Status: DC
  Administered 2016-04-07: via INTRAVENOUS

## 2016-04-06 MED ORDER — SUGAMMADEX SODIUM 200 MG/2ML IV SOLN
INTRAVENOUS | Status: AC
  Filled 2016-04-06: qty 2

## 2016-04-06 MED ORDER — VANCOMYCIN HCL IN DEXTROSE 1-5 GM/200ML-% IV SOLN
1000.0000 mg | Freq: Two times a day (BID) | INTRAVENOUS | Status: AC
  Administered 2016-04-07: 1000 mg via INTRAVENOUS
  Filled 2016-04-06: qty 200

## 2016-04-06 MED ORDER — MARAVIROC 300 MG PO TABS
300.0000 mg | ORAL_TABLET | Freq: Two times a day (BID) | ORAL | Status: DC
  Administered 2016-04-06 – 2016-04-09 (×6): 300 mg via ORAL
  Filled 2016-04-06 (×7): qty 1

## 2016-04-06 MED ORDER — HYDROMORPHONE HCL 1 MG/ML IJ SOLN
INTRAMUSCULAR | Status: DC | PRN
  Administered 2016-04-06 (×3): .2 mg via INTRAVENOUS
  Administered 2016-04-06: .4 mg via INTRAVENOUS
  Administered 2016-04-06 (×3): .2 mg via INTRAVENOUS
  Administered 2016-04-06: .4 mg via INTRAVENOUS

## 2016-04-06 MED ORDER — ADULT MULTIVITAMIN W/MINERALS CH
1.0000 | ORAL_TABLET | Freq: Every day | ORAL | Status: DC
  Administered 2016-04-06 – 2016-04-09 (×4): 1 via ORAL
  Filled 2016-04-06 (×4): qty 1

## 2016-04-06 MED ORDER — HYDROMORPHONE HCL 1 MG/ML IJ SOLN
1.0000 mg | INTRAMUSCULAR | Status: DC | PRN
  Administered 2016-04-06 – 2016-04-09 (×8): 1 mg via INTRAVENOUS
  Filled 2016-04-06 (×9): qty 1

## 2016-04-06 MED ORDER — LACTATED RINGERS IV SOLN
INTRAVENOUS | Status: DC | PRN
  Administered 2016-04-06 (×3): via INTRAVENOUS

## 2016-04-06 MED ORDER — VANCOMYCIN HCL 1000 MG IV SOLR
1000.0000 mg | Freq: Two times a day (BID) | INTRAVENOUS | Status: DC
  Administered 2016-04-06: 1000 mg via INTRAVENOUS
  Filled 2016-04-06 (×4): qty 1000

## 2016-04-06 MED ORDER — EMTRICITABINE-TENOFOVIR AF 200-25 MG PO TABS
1.0000 | ORAL_TABLET | Freq: Every day | ORAL | Status: DC
  Administered 2016-04-06 – 2016-04-09 (×4): 1 via ORAL
  Filled 2016-04-06 (×4): qty 1

## 2016-04-06 MED ORDER — HYDROMORPHONE HCL 1 MG/ML IJ SOLN: 0.2500 mg | INTRAMUSCULAR | Status: DC | PRN

## 2016-04-06 MED ORDER — 0.9 % SODIUM CHLORIDE (POUR BTL) OPTIME
TOPICAL | Status: DC | PRN
  Administered 2016-04-06: 1000 mL

## 2016-04-06 MED ORDER — ROCURONIUM BROMIDE 100 MG/10ML IV SOLN
INTRAVENOUS | Status: AC
  Filled 2016-04-06: qty 1

## 2016-04-06 MED ORDER — FENTANYL CITRATE (PF) 100 MCG/2ML IJ SOLN
25.0000 ug | INTRAMUSCULAR | Status: DC | PRN
Start: 2016-04-06 — End: 2016-04-06
  Administered 2016-04-06 (×2): 50 ug via INTRAVENOUS

## 2016-04-06 MED ORDER — HYDROMORPHONE HCL 2 MG/ML IJ SOLN
INTRAMUSCULAR | Status: AC
  Filled 2016-04-06: qty 1

## 2016-04-06 MED ORDER — LIDOCAINE HCL (CARDIAC) 20 MG/ML IV SOLN
INTRAVENOUS | Status: AC
  Filled 2016-04-06: qty 5

## 2016-04-06 MED ORDER — SODIUM CHLORIDE 0.9 % IR SOLN
Status: DC | PRN
  Administered 2016-04-06: 3000 mL

## 2016-04-06 MED ORDER — SUGAMMADEX SODIUM 200 MG/2ML IV SOLN
INTRAVENOUS | Status: DC | PRN
  Administered 2016-04-06: 200 mg via INTRAVENOUS

## 2016-04-06 MED ORDER — PROPOFOL 10 MG/ML IV BOLUS
INTRAVENOUS | Status: DC | PRN
  Administered 2016-04-06: 150 mg via INTRAVENOUS

## 2016-04-06 MED ORDER — ALUM & MAG HYDROXIDE-SIMETH 200-200-20 MG/5ML PO SUSP: 30.0000 mL | ORAL | Status: DC | PRN

## 2016-04-06 MED ORDER — DIPHENHYDRAMINE HCL 12.5 MG/5ML PO ELIX
12.5000 mg | ORAL_SOLUTION | ORAL | Status: DC | PRN
  Administered 2016-04-07: 25 mg via ORAL
  Administered 2016-04-07 (×2): 12.5 mg via ORAL
  Administered 2016-04-08 (×3): 25 mg via ORAL
  Filled 2016-04-06: qty 10
  Filled 2016-04-06: qty 5
  Filled 2016-04-06: qty 10
  Filled 2016-04-06: qty 5
  Filled 2016-04-06 (×2): qty 10

## 2016-04-06 MED ORDER — SODIUM CHLORIDE 0.9 % IJ SOLN
INTRAMUSCULAR | Status: AC
  Filled 2016-04-06: qty 10

## 2016-04-06 MED ORDER — FENTANYL CITRATE (PF) 250 MCG/5ML IJ SOLN
INTRAMUSCULAR | Status: AC
  Filled 2016-04-06: qty 5

## 2016-04-06 MED ORDER — HYDROMORPHONE HCL 1 MG/ML IJ SOLN
0.2500 mg | INTRAMUSCULAR | Status: DC | PRN
Start: 2016-04-06 — End: 2016-04-06
  Administered 2016-04-06 (×5): 0.5 mg via INTRAVENOUS

## 2016-04-06 MED ORDER — FERROUS SULFATE 325 (65 FE) MG PO TABS
325.0000 mg | ORAL_TABLET | Freq: Every day | ORAL | Status: DC
  Administered 2016-04-07 – 2016-04-08 (×2): 325 mg via ORAL
  Filled 2016-04-06 (×5): qty 1

## 2016-04-06 MED ORDER — MENTHOL 3 MG MT LOZG: 1.0000 | LOZENGE | OROMUCOSAL | Status: DC | PRN

## 2016-04-06 MED ORDER — EPHEDRINE SULFATE 50 MG/ML IJ SOLN
INTRAMUSCULAR | Status: AC
  Filled 2016-04-06: qty 1

## 2016-04-06 MED ORDER — ACETAMINOPHEN 325 MG PO TABS: 650.0000 mg | ORAL_TABLET | Freq: Four times a day (QID) | ORAL | Status: DC | PRN

## 2016-04-06 MED ORDER — TRANEXAMIC ACID 1000 MG/10ML IV SOLN
1000.0000 mg | INTRAVENOUS | Status: AC
  Administered 2016-04-06: 1000 mg via INTRAVENOUS
  Filled 2016-04-06: qty 10

## 2016-04-06 MED ORDER — METHOCARBAMOL 1000 MG/10ML IJ SOLN
500.0000 mg | Freq: Four times a day (QID) | INTRAVENOUS | Status: DC | PRN
  Administered 2016-04-06 (×2): 500 mg via INTRAVENOUS
  Filled 2016-04-06 (×4): qty 5

## 2016-04-06 MED ORDER — SODIUM CHLORIDE 0.9 % IJ SOLN
INTRAMUSCULAR | Status: AC
Start: 2016-04-06 — End: 2016-04-06
  Filled 2016-04-06: qty 20

## 2016-04-06 MED ORDER — DEXAMETHASONE SODIUM PHOSPHATE 10 MG/ML IJ SOLN
INTRAMUSCULAR | Status: DC | PRN
  Administered 2016-04-06: 5 mg via INTRAVENOUS

## 2016-04-06 MED ORDER — DOCUSATE SODIUM 100 MG PO CAPS
100.0000 mg | ORAL_CAPSULE | Freq: Two times a day (BID) | ORAL | Status: DC
  Administered 2016-04-06 – 2016-04-09 (×6): 100 mg via ORAL

## 2016-04-06 MED ORDER — CEFAZOLIN SODIUM-DEXTROSE 2-4 GM/100ML-% IV SOLN
2.0000 g | INTRAVENOUS | Status: DC
  Filled 2016-04-06: qty 100

## 2016-04-06 MED ORDER — OXYCODONE HCL 5 MG PO TABS
5.0000 mg | ORAL_TABLET | ORAL | Status: DC | PRN
  Administered 2016-04-07 (×2): 10 mg via ORAL
  Administered 2016-04-07: 15 mg via ORAL
  Administered 2016-04-07 – 2016-04-09 (×4): 10 mg via ORAL
  Filled 2016-04-06: qty 2
  Filled 2016-04-06: qty 3
  Filled 2016-04-06 (×7): qty 2

## 2016-04-06 MED ORDER — HYDROMORPHONE HCL 1 MG/ML IJ SOLN
INTRAMUSCULAR | Status: AC
  Administered 2016-04-07: 1 mg via INTRAVENOUS
  Filled 2016-04-06: qty 1

## 2016-04-06 MED ORDER — ONDANSETRON HCL 4 MG PO TABS: 4.0000 mg | ORAL_TABLET | Freq: Four times a day (QID) | ORAL | Status: DC | PRN

## 2016-04-06 MED ORDER — FENTANYL CITRATE (PF) 100 MCG/2ML IJ SOLN
INTRAMUSCULAR | Status: AC
  Filled 2016-04-06: qty 2

## 2016-04-06 MED ORDER — MIDAZOLAM HCL 2 MG/2ML IJ SOLN
INTRAMUSCULAR | Status: AC
  Filled 2016-04-06: qty 2

## 2016-04-06 MED ORDER — ALPRAZOLAM 1 MG PO TABS
1.0000 mg | ORAL_TABLET | Freq: Every evening | ORAL | Status: DC | PRN
  Administered 2016-04-07 – 2016-04-08 (×2): 1 mg via ORAL
  Filled 2016-04-06 (×2): qty 1

## 2016-04-06 MED ORDER — LACTATED RINGERS IV SOLN
Freq: Once | INTRAVENOUS | Status: AC
  Administered 2016-04-06: 11:00:00 via INTRAVENOUS

## 2016-04-06 MED ORDER — METHOCARBAMOL 500 MG PO TABS
500.0000 mg | ORAL_TABLET | Freq: Four times a day (QID) | ORAL | Status: DC | PRN
  Administered 2016-04-07 – 2016-04-08 (×5): 500 mg via ORAL
  Filled 2016-04-06 (×6): qty 1

## 2016-04-06 MED ORDER — LIDOCAINE HCL (CARDIAC) 20 MG/ML IV SOLN
INTRAVENOUS | Status: DC | PRN
  Administered 2016-04-06: 100 mg via INTRAVENOUS

## 2016-04-06 MED ORDER — STERILE WATER FOR IRRIGATION IR SOLN
Status: DC | PRN
  Administered 2016-04-06: 1000 mL

## 2016-04-06 MED ORDER — ACETAMINOPHEN 650 MG RE SUPP: 650.0000 mg | Freq: Four times a day (QID) | RECTAL | Status: DC | PRN

## 2016-04-06 MED ORDER — CITALOPRAM HYDROBROMIDE 20 MG PO TABS
20.0000 mg | ORAL_TABLET | Freq: Every morning | ORAL | Status: DC
Start: 2016-04-07 — End: 2016-04-09
  Administered 2016-04-07 – 2016-04-09 (×3): 20 mg via ORAL
  Filled 2016-04-06 (×3): qty 1

## 2016-04-06 MED ORDER — PROPOFOL 10 MG/ML IV BOLUS
INTRAVENOUS | Status: AC
  Filled 2016-04-06: qty 20

## 2016-04-06 MED ORDER — VANCOMYCIN HCL IN DEXTROSE 1-5 GM/200ML-% IV SOLN
INTRAVENOUS | Status: AC
  Filled 2016-04-06: qty 200

## 2016-04-06 MED ORDER — DOLUTEGRAVIR SODIUM 50 MG PO TABS
50.0000 mg | ORAL_TABLET | Freq: Two times a day (BID) | ORAL | Status: DC
Start: 2016-04-06 — End: 2016-04-09
  Administered 2016-04-06 – 2016-04-09 (×6): 50 mg via ORAL
  Filled 2016-04-06 (×7): qty 1

## 2016-04-06 MED ORDER — FENTANYL CITRATE (PF) 100 MCG/2ML IJ SOLN
INTRAMUSCULAR | Status: DC | PRN
  Administered 2016-04-06: 50 ug via INTRAVENOUS
  Administered 2016-04-06: 100 ug via INTRAVENOUS
  Administered 2016-04-06 (×4): 50 ug via INTRAVENOUS

## 2016-04-06 MED ORDER — ONDANSETRON HCL 4 MG/2ML IJ SOLN
INTRAMUSCULAR | Status: DC | PRN
  Administered 2016-04-06: 4 mg via INTRAVENOUS

## 2016-04-06 MED ORDER — ONDANSETRON HCL 4 MG/2ML IJ SOLN
4.0000 mg | Freq: Four times a day (QID) | INTRAMUSCULAR | Status: DC | PRN
  Administered 2016-04-07: 4 mg via INTRAVENOUS
  Filled 2016-04-06 (×2): qty 2

## 2016-04-06 MED ORDER — PHENOL 1.4 % MT LIQD
1.0000 | OROMUCOSAL | Status: DC | PRN
  Filled 2016-04-06: qty 177

## 2016-04-06 SURGICAL SUPPLY — 41 items
APL SKNCLS STERI-STRIP NONHPOA (GAUZE/BANDAGES/DRESSINGS) ×1
BAG SPEC THK2 15X12 ZIP CLS (MISCELLANEOUS)
BAG ZIPLOCK 12X15 (MISCELLANEOUS) IMPLANT
BENZOIN TINCTURE PRP APPL 2/3 (GAUZE/BANDAGES/DRESSINGS) ×2 IMPLANT
BLADE SAW SGTL 18X1.27X75 (BLADE) ×2 IMPLANT
CELLS DAT CNTRL 66122 CELL SVR (MISCELLANEOUS) ×1 IMPLANT
CLOTH BEACON ORANGE TIMEOUT ST (SAFETY) ×2 IMPLANT
COVER PERINEAL POST (MISCELLANEOUS) ×2 IMPLANT
DRAPE STERI IOBAN 125X83 (DRAPES) ×2 IMPLANT
DRAPE U-SHAPE 47X51 STRL (DRAPES) ×4 IMPLANT
DRSG AQUACEL AG ADV 3.5X10 (GAUZE/BANDAGES/DRESSINGS) ×2 IMPLANT
DURAPREP 26ML APPLICATOR (WOUND CARE) ×2 IMPLANT
ELECT REM PT RETURN 15FT ADLT (MISCELLANEOUS) IMPLANT
ELECT REM PT RETURN 9FT ADLT (ELECTROSURGICAL) ×2
ELECTRODE REM PT RTRN 9FT ADLT (ELECTROSURGICAL) ×1 IMPLANT
GAUZE XEROFORM 1X8 LF (GAUZE/BANDAGES/DRESSINGS) ×2 IMPLANT
GLOVE BIO SURGEON STRL SZ7.5 (GLOVE) ×4 IMPLANT
GLOVE ECLIPSE 8.0 STRL XLNG CF (GLOVE) ×2 IMPLANT
GLOVE INDICATOR 8.0 STRL GRN (GLOVE) ×4 IMPLANT
GOWN SPEC L3 XXLG W/TWL (GOWN DISPOSABLE) ×6 IMPLANT
GOWN STRL REUS W/TWL LRG LVL3 (GOWN DISPOSABLE) ×6 IMPLANT
HANDPIECE INTERPULSE COAX TIP (DISPOSABLE) ×2
HEAD CERAMIC DELTA 36 PLUS 1.5 (Hips) ×2 IMPLANT
HEAD M SROM 36MM 2 (Hips) ×1 IMPLANT
HOLDER FOLEY CATH W/STRAP (MISCELLANEOUS) ×2 IMPLANT
LINER ACETAB NEUTRAL 36ID 520D (Liner) ×2 IMPLANT
PACK ANTERIOR HIP CUSTOM (KITS) ×2 IMPLANT
PIN SECTOR W/GRIP ACE CUP 52MM (Hips) ×2 IMPLANT
RTRCTR WOUND ALEXIS 18CM MED (MISCELLANEOUS) ×2
SCREW PINN CAN 6.5X20 (Screw) ×4 IMPLANT
SET HNDPC FAN SPRY TIP SCT (DISPOSABLE) ×1 IMPLANT
SROM M HEAD 36MM 2 (Hips) ×2 IMPLANT
STEM FEMORAL SYS SZ1 REV HIP (Stem) ×2 IMPLANT
STRIP CLOSURE SKIN 1/2X4 (GAUZE/BANDAGES/DRESSINGS) ×2 IMPLANT
SUT MNCRL AB 4-0 PS2 18 (SUTURE) ×2 IMPLANT
SUT VIC AB 1 CT1 36 (SUTURE) ×6 IMPLANT
SUT VIC AB 2-0 CT1 27 (SUTURE) ×4
SUT VIC AB 2-0 CT1 TAPERPNT 27 (SUTURE) ×2 IMPLANT
TRAY FOLEY W/METER SILVER 14FR (SET/KITS/TRAYS/PACK) ×2 IMPLANT
TRAY FOLEY W/METER SILVER 16FR (SET/KITS/TRAYS/PACK) IMPLANT
WATER STERILE IRR 1500ML POUR (IV SOLUTION) ×2 IMPLANT

## 2016-04-06 NOTE — H&P (Signed)
TOTAL HIP REVISION ADMISSION H&P  Patient is admitted for left revision total hip arthroplasty.  Subjective:  Chief Complaint: left hip pain  HPI: Ashley Shepherd, 60 y.o. female, has a history of pain and functional disability in the left hip due to infection and removal or previous components and patient has failed non-surgical conservative treatments for greater than 12 weeks to include activity modification and IV antibiotics. The indications for the revision total hip arthroplasty are history of total hip infection.  Onset of symptoms was gradual starting 1 years ago with gradually worsening course since that time.  Prior procedures on the left hip include removal of hip components and placement of temporary antibiotic spacer.  Patient currently rates pain in the left hip at 10 out of 10 with activity.  There is worsening of pain with activity and weight bearing. Patient has evidence of temporary antibiotic spacer by imaging studies.  This condition presents safety issues increasing the risk of falls.  This patient has had avascular necrosis of the hip.  There is no current active infection.  Patient Active Problem List   Diagnosis Date Noted  . HIV disease ([place])   . CKD (chronic kidney disease) stage 3, GFR 30-59 ml/min   . 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 Avascular necrosis of bone of left hip ([place]) 10/01/2014  . 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 FB GI (foreign body in gastrointestinal tract) 04/09/2014   Past Medical History  Diagnosis Date  . HIV disease ([place])   . Cancer ([place])     7 years ago, cancer free now  . 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[place])     left  . Arthritis   . GERD (gastroesophageal reflux disease)   . History of kidney stones   . History of transfusion   . Anemia   . Difficulty sleeping   . Anxiety   . Pneumonia   . Depression   . Kidney stones     Past  Surgical History  Procedure Laterality Date  . Appendectomy    . Knee arthroscopy      left  . Colon surgery  2008  . Colostomy reversal  2013  . Port-a-cath removal    . Total hip arthroplasty Left 10/01/2014    Procedure: LEFT TOTAL HIP ARTHROPLASTY ANTERIOR APPROACH;  Surgeon: Mcarthur Rossetti, MD;  Location: [facility];  Service: Orthopedics;  Laterality: Left;  . Incision and drainage hip Left 01/24/2016    Procedure: IRRIGATION AND DEBRIDEMENT LEFT HIP;  Surgeon: Mcarthur Rossetti, MD;  Location: [place];  Service: Orthopedics;  Laterality: Left;  . Excisional total hip arthroplasty with antibiotic spacers Left 01/24/2016    Procedure: EXCISION OF LEFT TOTAL HIP ARTHROPLASTY WITH PLACEMENT OF ANTIBIOTIC SPACERS;  Surgeon: Mcarthur Rossetti, MD;  Location: [place];  Service: Orthopedics;  Laterality: Left;    No prescriptions prior to admission   Allergies  Allergen Reactions  . Codeine Itching  . Ivp Dye [Iodinated Diagnostic Agents] Itching    Pt stated that she had NO problem/reaction to topical iodine/betadine solution.  . Sulfa Antibiotics Itching    Social History  Substance Use Topics  . Smoking status: Current Every Day Smoker -- 0.50 packs/day    Types: Cigarettes  . Smokeless tobacco: Never Used  . Alcohol Use: No    Family History  Problem Relation Age of Onset  . Hypertension  Father   . Diabetes Father   . Heart attack Father   . Hypertension Other   . Diabetes Other       Review of Systems  All other systems reviewed and are negative.   Objective:  Physical Exam  Constitutional: She is oriented to person, place, and time. She appears well-developed and well-nourished.  HENT:  Head: Normocephalic and atraumatic.  Eyes: EOM are normal. Pupils are equal, round, and reactive to light.  Neck: Normal range of motion. Neck supple.  Cardiovascular: Normal rate and regular rhythm.   Respiratory: Effort normal and breath sounds normal.  GI: Soft.  Bowel sounds are normal.  Musculoskeletal:       Left hip: She exhibits decreased range of motion, decreased strength and bony tenderness.  Neurological: She is oriented to person, place, and time.  Skin: Skin is warm.  Psychiatric: She has a normal mood and affect.    Vital signs in last 24 hours:     Labs:   Estimated body mass index is 27.41 kg/(m^2) as calculated from the following:   Height as of 01/24/16: 5\' 10"  (1.778 m).   Weight as of 03/06/16: 86.637 kg (191 lb).  Imaging Review:  Plain radiographs demonstrate an intact antibiotic spacer in the left hip  Assessment/Plan:  Previous infection, left hip(s) with failed previous arthroplasty.  The patient history, physical examination, clinical judgement of the provider and imaging studies are consistent with end stage degenerative joint disease of the left hip(s), previous total hip arthroplasty. Revision total hip arthroplasty is deemed medically necessary. The treatment options including medical management, injection therapy, arthroscopy and arthroplasty were discussed at length. The risks and benefits of total hip arthroplasty were presented and reviewed. The risks due to aseptic loosening, infection, stiffness, dislocation/subluxation,  thromboembolic complications and other imponderables were discussed.  The patient acknowledged the explanation, agreed to proceed with the plan and consent was signed. Patient is being admitted for inpatient treatment for surgery, pain control, PT, OT, prophylactic antibiotics, VTE prophylaxis, progressive ambulation and ADL's and discharge planning. The patient is planning to be discharged home with home health services

## 2016-04-06 NOTE — Anesthesia Preprocedure Evaluation (Addendum)
Anesthesia Evaluation  Patient identified by MRN, date of birth, ID band Patient awake    Reviewed: Allergy & Precautions, H&P , NPO status , Patient's Chart, lab work & pertinent test results, reviewed documented beta blocker date and time   Airway Mallampati: II  TM Distance: >3 FB Neck ROM: full    Dental no notable dental hx. (+) Dental Advisory Given   Pulmonary Current Smoker,    Pulmonary exam normal breath sounds clear to auscultation       Cardiovascular negative cardio ROS Normal cardiovascular exam Rhythm:regular Rate:Normal     Neuro/Psych PSYCHIATRIC DISORDERS negative neurological ROS     GI/Hepatic Neg liver ROS, GERD  ,  Endo/Other  negative endocrine ROS  Renal/GU negative Renal ROS     Musculoskeletal negative musculoskeletal ROS (+) Arthritis , Osteoarthritis,    Abdominal   Peds  Hematology negative hematology ROS (+) anemia , HIV,   Anesthesia Other Findings   Reproductive/Obstetrics negative OB ROS                            Anesthesia Physical  Anesthesia Plan  ASA: II  Anesthesia Plan: General   Post-op Pain Management:    Induction: Intravenous  Airway Management Planned: Oral ETT  Additional Equipment:   Intra-op Plan:   Post-operative Plan: Extubation in OR  Informed Consent: I have reviewed the patients History and Physical, chart, labs and discussed the procedure including the risks, benefits and alternatives for the proposed anesthesia with the patient or authorized representative who has indicated his/her understanding and acceptance.   Dental Advisory Given and Dental advisory given  Plan Discussed with: CRNA and Surgeon  Anesthesia Plan Comments: (  Discussed general anesthesia, including possible nausea, instrumentation of airway, sore throat,pulmonary aspiration, etc. I asked if the were any outstanding questions, or  concerns before we  proceeded. )       Anesthesia Quick Evaluation

## 2016-04-06 NOTE — Anesthesia Procedure Notes (Signed)
Procedure Name: Intubation Date/Time: 04/06/2016 1:20 PM Performed by: Christell Faith L Pre-anesthesia Checklist: Patient identified, Emergency Drugs available, Suction available, Patient being monitored and Timeout performed Patient Re-evaluated:Patient Re-evaluated prior to inductionOxygen Delivery Method: Circle system utilized Preoxygenation: Pre-oxygenation with 100% oxygen Intubation Type: IV induction Ventilation: Mask ventilation without difficulty Laryngoscope Size: Mac and 4 Grade View: Grade I Tube type: Oral Tube size: 7.5 mm Number of attempts: 1 Airway Equipment and Method: Stylet Placement Confirmation: ETT inserted through vocal cords under direct vision,  positive ETCO2,  CO2 detector and breath sounds checked- equal and bilateral Secured at: 22 cm Tube secured with: Tape Dental Injury: Teeth and Oropharynx as per pre-operative assessment

## 2016-04-06 NOTE — Brief Op Note (Signed)
04/06/2016  4:17 PM  PATIENT:  Ashley Shepherd  60 y.o. female  PRE-OPERATIVE DIAGNOSIS:  History of left hip infection post excision arthroplasty  POST-OPERATIVE DIAGNOSIS:  History of left hip infection post excision arthroplasty  PROCEDURE:  Procedure(s): REMOVAL OF TEMPORARY ANTIBIOTIC SPACER LEFT HIP, LEFT TOTAL HIP REVISION (Left)  SURGEON:  Surgeon(s) and Role:    * Mcarthur Rossetti, MD - Primary  ANESTHESIA:   general  EBL:  Total I/O In: 2000 [I.V.:2000] Out: 425 [Urine:225; Blood:200]  COUNTS:  YES  TOURNIQUET:  * No tourniquets in log *  DICTATION: .Other Dictation: Dictation Number [id number]  PLAN OF CARE: Admit to inpatient   PATIENT DISPOSITION:  PACU - hemodynamically stable.   Delay start of Pharmacological VTE agent (>24hrs) due to surgical blood loss or risk of bleeding: no

## 2016-04-06 NOTE — Transfer of Care (Signed)
Immediate Anesthesia Transfer of Care Note  Patient: Ashley Shepherd  Procedure(s) Performed: Procedure(s): REMOVAL OF TEMPORARY ANTIBIOTIC SPACER LEFT HIP, LEFT TOTAL HIP REVISION (Left)  Patient Location: PACU  Anesthesia Type:General  Level of Consciousness:  sedated, patient cooperative and responds to stimulation  Airway & Oxygen Therapy:Patient Spontanous Breathing and Patient connected to face mask oxgen  Post-op Assessment:  Report given to PACU RN and Post -op Vital signs reviewed and stable  Post vital signs:  Reviewed and stable  Last Vitals:  Filed Vitals:   04/06/16 1056 04/06/16 1622  BP: 128/87 115/53  Pulse: 81 81  Temp: 37.1 C 36.5 C  Resp: 18 12    Complications: No apparent anesthesia complications

## 2016-04-07 LAB — CBC
HEMATOCRIT: 36.1 % (ref 36.0–46.0)
Hemoglobin: 11.7 g/dL — ABNORMAL LOW (ref 12.0–15.0)
MCH: 29.4 pg (ref 26.0–34.0)
MCHC: 32.4 g/dL (ref 30.0–36.0)
MCV: 90.7 fL (ref 78.0–100.0)
PLATELETS: 198 10*3/uL (ref 150–400)
RBC: 3.98 MIL/uL (ref 3.87–5.11)
RDW: 15.6 % — AB (ref 11.5–15.5)
WBC: 10.1 10*3/uL (ref 4.0–10.5)

## 2016-04-07 LAB — BASIC METABOLIC PANEL
Anion gap: 8 (ref 5–15)
BUN: 20 mg/dL (ref 6–20)
CHLORIDE: 105 mmol/L (ref 101–111)
CO2: 29 mmol/L (ref 22–32)
CREATININE: 1.32 mg/dL — AB (ref 0.44–1.00)
Calcium: 9.2 mg/dL (ref 8.9–10.3)
GFR calc Af Amer: 50 mL/min — ABNORMAL LOW (ref >60)
GFR calc non Af Amer: 43 mL/min — ABNORMAL LOW (ref >60)
GLUCOSE: 178 mg/dL — AB (ref 65–99)
Potassium: 4.9 mmol/L (ref 3.5–5.1)
Sodium: 142 mmol/L (ref 135–145)

## 2016-04-07 NOTE — Progress Notes (Signed)
Physical Therapy Treatment Note    04/07/16 1600  PT Visit Information  Last PT Received On 04/07/16  Assistance Needed +1  History of Present Illness Pt is a 60 year old female s/p removal of L hip temporary antibiotic spacer and placement of revision acetabular and femoral components.  PMHx: HIV  PT Time Calculation  PT Start Time (ACUTE ONLY) 1415  PT Stop Time (ACUTE ONLY) 1443  PT Time Calculation (min) (ACUTE ONLY) 28 min  Subjective Data  Subjective Pt ambulated again in hallway and then assisted back to bed.  Pt progressing well.  Pt also performed LE exercises in supine.  Precautions  Precautions Fall  Precaution Comments no hip precautions  Restrictions  Other Position/Activity Restrictions WBAT  Pain Assessment  Pain Assessment 0-10  Pain Score 4  Pain Location L hip  Pain Descriptors / Indicators Aching;Sore  Pain Intervention(s) Limited activity within patient's tolerance;Monitored during session;Repositioned  Cognition  Arousal/Alertness Awake/alert  Behavior During Therapy WFL for tasks assessed/performed  Overall Cognitive Status Within Functional Limits for tasks assessed  Bed Mobility  Overal bed mobility Needs Assistance  Bed Mobility Sit to Supine  Supine to sit Min guard  General bed mobility comments verbal cues for self assist, pt wished to perform without external assist  Transfers  Overall transfer level Needs assistance  Equipment used Rolling walker (2 wheeled)  Transfers Sit to/from Stand  Sit to Stand Min guard  General transfer comment verbal cues for UE and LE positioning, increased time to perform however no physical assist required  Ambulation/Gait  Ambulation/Gait assistance Min guard  Ambulation Distance (Feet) 160 Feet  Assistive device Rolling walker (2 wheeled)  Gait Pattern/deviations Step-to pattern;Step-through pattern;Antalgic  General Gait Details progressed to step through pattern  Exercises  Exercises Total Joint  Total  Joint Exercises  Ankle Circles/Pumps AROM;10 reps;Both  Quad Sets AROM;10 reps;Both  Short Arc Quad AROM;10 reps;Left  Heel Slides AAROM;Left;10 reps  Hip ABduction/ADduction AAROM;10 reps;Left  PT - End of Session  Equipment Utilized During Treatment Gait belt  Activity Tolerance Patient tolerated treatment well  Patient left in bed;with call bell/phone within reach  PT - Assessment/Plan  PT Plan Current plan remains appropriate  PT Frequency (ACUTE ONLY) 7X/week  Follow Up Recommendations Home health PT  PT equipment None recommended by PT  PT Goal Progression  Progress towards PT goals Progressing toward goals  PT General Charges  $$ ACUTE PT VISIT 1 Procedure  PT Treatments  $Gait Training 8-22 mins  $Therapeutic Exercise 8-22 mins   Carmelia Bake, PT, DPT 04/07/2016 Pager: [phone]

## 2016-04-07 NOTE — Op Note (Signed)
NAMENICHOLETTE, Ashley Shepherd                  ACCOUNT NO.:  [id number]  MEDICAL RECORD NO.:  [id number]  LOCATION:  [id number]                         FACILITY:  [facility]  PHYSICIAN:  Lind Guest. Ninfa Linden, M.D.DATE OF BIRTH:  August 02, 1956  DATE OF PROCEDURE:  04/06/2016 DATE OF DISCHARGE:                              OPERATIVE REPORT   PREOPERATIVE DIAGNOSIS:  Status post excision and arthroplasty of infected left total hip and placement of antibiotic temporary spacer.  PREOPERATIVE DIAGNOSIS:  Status post excision and arthroplasty of infected left total hip and placement of antibiotic temporary spacer.  PROCEDURES:  Two-stage revision of left total hip arthroplasty with removal of temporary antibiotic spacer and placement of revision acetabular and femoral components.  IMPLANTS:  [employer] G[employer]ion acetabular component size 52 with two screws, size 36 neutral polyethylene liner, size 11 Corail revision femoral component with standard offset, size 36- 2 metal hip ball.  SURGEON:  Lind Guest. Ninfa Linden, M.D.  ANESTHESIA:  General.  ANTIBIOTICS:  1 g of IV Ancef.  BLOOD LOSS:  200 mL.  COMPLICATIONS:  None.  INDICATIONS:  Ms. Schlink is a 60 year old female, who over a year ago had a primary total hip arthroplasty through direct anterior approach.  She has done well for over a year and then developed acute left hip pain after an ear infection.  We took her to the operating room and opened up the hip and found a purulent material around the acetabular component and around the proximal aspect of the femoral component.  We irrigated this out completely and removed all components given the infection.  We then placed a temporary antibiotic-laden Prostalac spacer and had an Infectious Disease consult obtained.  She ended up growing out a strep species from her hip and has been on 6 weeks of IV antibiotics.  At that time, it is past now and her hip is continued to improve.  She is on oral  antibiotics and at this point, we felt that she has eradicated and cleared the infection and she can present first two-stage revision.  The risks and benefits of this were explained to her in detail, and she does wish to proceed.  PROCEDURE DESCRIPTION:  After informed consent was obtained, appropriate left hip was marked.  She was brought to the operating room and placed supine on the operating table.  General anesthesia was then obtained. Her left hip was then prepped and draped with DuraPrep and sterile drapes.  A time-out was called and she was identified as correct patient and correct left hip.  Again, she was placed on the [facility] fracture table with the perineal post in place and both legs in inline skeletal traction devices, but no traction applied.  We then opened up her previous incision and dissected down to the iliotibial band.  The iliotibial band was then divided longitudinally, and we could get down the hip anteriorly.  We opened up the hip capsule and did find an effusion, but no evidence of infection.  We irrigated the soft tissue thoroughly with normal saline solution.  We then were able to slowly and meticulously remove the temporary femoral component and the acetabular component and removed all evidence  of cement debris.  Attention was then first turned to the femur.  We then began broaching from size 8 broach using the Corail revision and broach up to 11 and after 11, we placed reamers for distal reaming up to a size 13.  We then went back to the acetabular part and after cleaning all that debris, reamed from a size 48 reamer up to a size 52 for good rim fit and this was all done under direct fluoroscopy and visualization.  Once we were pleased with our depth of reaming, our inclination and anteversion, we placed the real size 52 acetabular component, which fitted well.  We did this under direct visualization and fluoroscopy and then placed two screws.  We then placed  a 36+ 0 neutral polyethylene liner for that size acetabular component.  Attention was then turned back to the femur.  We trialed a size 11 revision Corail femoral component with standard offset and a 36- 2 hip ball.  We reduced her down in the acetabulum and we were pleased with stability, leg length, and offset.  We did feel like we could go with little bit longer length.  We then placed the real Corail revision stem with a collar size 11 and standard offset and a 36+ 1.5 ceramic hip ball.  We tried to reducing the acetabulum and we were unsuccessful, so we had to remove that hip ball and we went back down to a 36- 2 metal hip ball.  We were able to successfully reduce this.  I put her through a significant range of motion of her hip through internal and external rotation, flexion and extension as well as shuck and she was stable. Her leg lengths were also equal under direct fluoroscopy as well as offset.  We then irrigated the soft tissue thoroughly with normal saline solution using pulsatile lavage with 3 liters of lavage.  There was no joint capsule closed.  We closed the iliotibial band with interrupted #1 Vicryl suture followed by 0 Vicryl in the deep tissue, 2-0 Vicryl in the subcutaneous tissue, and interrupted staples on the skin.  Xeroform and Aquacel dressing were applied.  She was taken off the [place] table, awakened, extubated and taken to the recovery room in stable condition. All final counts were correct.  There were no complications noted.     Lind Guest. Ninfa Linden, M.D.     CYB/MEDQ  D:  04/06/2016  T:  04/07/2016  Job:  [id number]

## 2016-04-07 NOTE — Evaluation (Signed)
Physical Therapy Evaluation Patient Details Name: Ashley Shepherd MRN: [id number] DOB: [birth year] Today's Date: 04/07/2016   History of Present Illness  Pt is a 60 year old female s/p removal of L hip temporary antibiotic spacer and placement of revision acetabular and femoral components.  PMHx: HIV  Clinical Impression  Pt is s/p above surgery resulting in the deficits listed below (see PT Problem List).  Pt will benefit from skilled PT to increase their independence and safety with mobility to allow discharge to the venue listed below.  Pt mobilizing well POD #1 and very motivated to return to independent lifestyle.     Follow Up Recommendations Home health PT    Equipment Recommendations  None recommended by PT    Recommendations for Other Services       Precautions / Restrictions Precautions Precautions: Fall Precaution Comments: no hip precautions Restrictions Other Position/Activity Restrictions: WBAT      Mobility  Bed Mobility Overal bed mobility: Needs Assistance Bed Mobility: Supine to Sit     Supine to sit: Min guard     General bed mobility comments: verbal cues for self assist, pt wished to perform without external assist  Transfers Overall transfer level: Needs assistance Equipment used: Rolling walker (2 wheeled) Transfers: Sit to/from Stand Sit to Stand: Min guard         General transfer comment: verbal cues for UE and LE positioning, increased time to perform however no physical assist required  Ambulation/Gait Ambulation/Gait assistance: Min guard Ambulation Distance (Feet): 80 Feet Assistive device: Rolling walker (2 wheeled) Gait Pattern/deviations: Step-to pattern;Antalgic     General Gait Details: verbal cues for sequence, step length, pt reports ambulating feels good  Stairs            Wheelchair Mobility    Modified Rankin (Stroke Patients Only)       Balance                                              Pertinent Vitals/Pain Pain Assessment: 0-10 Pain Score: 3  Pain Location: L hip Pain Descriptors / Indicators: Sore;Aching Pain Intervention(s): Limited activity within patient's tolerance;Monitored during session;Repositioned    Home Living Family/patient expects to be discharged to:: Private residence Living Arrangements: Spouse/significant other;Children Available Help at Discharge: Family;Available 24 hours/day Type of Home: House Home Access: Stairs to enter Entrance Stairs-Rails: None Entrance Stairs-Number of Steps: 1   Home Equipment: Walker - 2 wheels;Bedside commode;Cane - single point      Prior Function Level of Independence: Independent with assistive device(s)               Hand Dominance        Extremity/Trunk Assessment               Lower Extremity Assessment: LLE deficits/detail   LLE Deficits / Details: functional decreased hip strength observed     Communication   Communication: No difficulties  Cognition Arousal/Alertness: Awake/alert Behavior During Therapy: WFL for tasks assessed/performed Overall Cognitive Status: Within Functional Limits for tasks assessed                      General Comments      Exercises        Assessment/Plan    PT Assessment Patient needs continued PT services  PT Diagnosis Difficulty walking;Acute pain   PT  Problem List Decreased strength;Decreased activity tolerance;Decreased mobility;Pain  PT Treatment Interventions Functional mobility training;Gait training;DME instruction;Stair training;Patient/family education;Therapeutic activities;Therapeutic exercise   PT Goals (Current goals can be found in the Care Plan section) Acute Rehab PT Goals PT Goal Formulation: With patient Time For Goal Achievement: 04/21/16 Potential to Achieve Goals: Good    Frequency 7X/week   Barriers to discharge        Co-evaluation               End of Session Equipment Utilized During  Treatment: Gait belt Activity Tolerance: Patient tolerated treatment well Patient left: in chair;with call bell/phone within reach           Time: 1109-1131 PT Time Calculation (min) (ACUTE ONLY): 22 min   Charges:   PT Evaluation $PT Eval Low Complexity: 1 Procedure     PT G Codes:        Kiasia Chou,KATHrine E 04/07/2016, 12:53 PM Carmelia Bake, PT, DPT 04/07/2016 Pager: [phone]

## 2016-04-07 NOTE — Progress Notes (Signed)
Subjective: 1 Day Post-Op Procedure(s) (LRB): REMOVAL OF TEMPORARY ANTIBIOTIC SPACER LEFT HIP, LEFT TOTAL HIP REVISION (Left) Patient reports pain as moderate.    Objective: Vital signs in last 24 hours: Temp:  [97.7 F (36.5 C)-98.8 F (37.1 C)] 98.1 F (36.7 C) (04/15 [id number]) Pulse Rate:  [59-82] 69 (04/15 0633) Resp:  [12-20] 17 (04/15 0633) BP: (100-128)/(52-87) 110/58 mmHg (04/15 0633) SpO2:  [99 %-100 %] 100 % (04/15 [id number]) Weight:  [86.637 kg (191 lb)] 86.637 kg (191 lb) (04/14 1115)  Intake/Output from previous day: 04/14 0701 - 04/15 0700 In: 3216.3 [P.O.:240; I.V.:2666.3; IV Piggyback:310] Out: 1085 [Urine:885; Blood:200] Intake/Output this shift:     Recent Labs  04/07/16 0416  HGB 11.7*    Recent Labs  04/07/16 0416  WBC 10.1  RBC 3.98  HCT 36.1  PLT 198    Recent Labs  04/07/16 0416  NA 142  K 4.9  CL 105  CO2 29  BUN 20  CREATININE 1.32*  GLUCOSE 178*  CALCIUM 9.2   No results for input(s): LABPT, INR in the last 72 hours.  Sensation intact distally Intact pulses distally Dorsiflexion/Plantar flexion intact Incision: scant drainage  Assessment/Plan: 1 Day Post-Op Procedure(s) (LRB): REMOVAL OF TEMPORARY ANTIBIOTIC SPACER LEFT HIP, LEFT TOTAL HIP REVISION (Left) Up with therapy Discharge home with home health likely Monday  BLACKMAN,CHRISTOPHER Y 04/07/2016, 9:54 AM

## 2016-04-07 NOTE — Progress Notes (Signed)
Pt not yet able to void since foley removed. Have attempted once to do I&O cath without success; also had P.Dancy, NT, try as well, also without success. Pt refusing more attempts for now. Will notify Dr Ninfa Linden.  Ashley Shepherd, [employer]

## 2016-04-08 NOTE — Progress Notes (Signed)
Physical Therapy Treatment Patient Details Name: Ashley Shepherd MRN: [id number] DOB: [birth year] Today's Date: 04/08/2016    History of Present Illness Pt is a 60 year old female s/p removal of L hip temporary antibiotic spacer and placement of revision acetabular and femoral components.  PMHx: HIV    PT Comments    Progressing with mobility. Will plan to practice climbing 1 step on tomorrow. Pain rated ~7/10 during session.   Follow Up Recommendations  Home health PT     Equipment Recommendations  None recommended by PT    Recommendations for Other Services       Precautions / Restrictions Precautions Precautions: Fall Restrictions Weight Bearing Restrictions: No LLE Weight Bearing: Weight bearing as tolerated    Mobility  Bed Mobility Overal bed mobility: Needs Assistance Bed Mobility: Supine to Sit;Sit to Supine     Supine to sit: Min guard Sit to supine: Min guard   General bed mobility comments: close guard. Increased time. Pt prefers to perform unassisted.   Transfers Overall transfer level: Needs assistance Equipment used: Rolling walker (2 wheeled) Transfers: Sit to/from Stand Sit to Stand: Min guard         General transfer comment: close guard for safety.   Ambulation/Gait Ambulation/Gait assistance: Min guard Ambulation Distance (Feet): 150 Feet Assistive device: Rolling walker (2 wheeled) Gait Pattern/deviations: Step-to pattern;Step-through pattern;Trunk flexed;Antalgic     General Gait Details: close guard for safety. slow gait speed.     Stairs            Wheelchair Mobility    Modified Rankin (Stroke Patients Only)       Balance                                    Cognition Arousal/Alertness: Awake/alert Behavior During Therapy: WFL for tasks assessed/performed Overall Cognitive Status: Within Functional Limits for tasks assessed                      Exercises      General Comments         Pertinent Vitals/Pain Pain Assessment: 0-10 Pain Score: 7  Pain Location: L hip with activity Pain Descriptors / Indicators: Sore Pain Intervention(s): Monitored during session;Repositioned    Home Living                      Prior Function            PT Goals (current goals can now be found in the care plan section) Progress towards PT goals: Progressing toward goals    Frequency  7X/week    PT Plan Current plan remains appropriate    Co-evaluation             End of Session   Activity Tolerance: Patient tolerated treatment well Patient left: in bed;with call bell/phone within reach     Time: [id number] PT Time Calculation (min) (ACUTE ONLY): 14 min  Charges:  $Gait Training: 8-22 mins                    G Codes:      Weston Anna, MPT Pager: [phone]

## 2016-04-08 NOTE — Progress Notes (Signed)
Subjective: 2 Days Post-Op Procedure(s) (LRB): REMOVAL OF TEMPORARY ANTIBIOTIC SPACER LEFT HIP, LEFT TOTAL HIP REVISION (Left) Patient reports pain as moderate.    " the pain medication makes me itch, I want to go home but Dr. Ninfa Linden said I had to stay until Monday "   Has not done stairs.   Objective: Vital signs in last 24 hours: Temp:  [98.4 F (36.9 C)-98.8 F (37.1 C)] 98.8 F (37.1 C) (04/16 0455) Pulse Rate:  [64-76] 76 (04/16 0455) Resp:  [15-16] 16 (04/16 0455) BP: (106-126)/(52-60) 126/60 mmHg (04/16 0455) SpO2:  [97 %-100 %] 97 % (04/16 0455)  Intake/Output from previous day: 04/15 0701 - 04/16 0700 In: 720 [P.O.:720] Out: 1250 [Urine:1250] Intake/Output this shift: Total I/O In: -  Out: 250 [Urine:250]   Recent Labs  04/07/16 0416  HGB 11.7*    Recent Labs  04/07/16 0416  WBC 10.1  RBC 3.98  HCT 36.1  PLT 198    Recent Labs  04/07/16 0416  NA 142  K 4.9  CL 105  CO2 29  BUN 20  CREATININE 1.32*  GLUCOSE 178*  CALCIUM 9.2   No results for input(s): LABPT, INR in the last 72 hours.  Neurologically intact  Assessment/Plan: 2 Days Post-Op Procedure(s) (LRB): REMOVAL OF TEMPORARY ANTIBIOTIC SPACER LEFT HIP, LEFT TOTAL HIP REVISION (Left) Up with therapy, walking in the halls slowly,    D/c saline lock.   Shahab Polhamus C 04/08/2016, 9:35 AM

## 2016-04-08 NOTE — Progress Notes (Signed)
Physical Therapy Treatment Patient Details Name: CAEDYN COGDILL MRN: [id number] DOB: 1956-11-15 Today's Date: 04/08/2016    History of Present Illness Pt is a 60 year old female s/p removal of L hip temporary antibiotic spacer and placement of revision acetabular and femoral components.  PMHx: HIV    PT Comments    Progressing slowly with mobility. Some increased pain on today.   Follow Up Recommendations  Home health PT     Equipment Recommendations  None recommended by PT    Recommendations for Other Services       Precautions / Restrictions Precautions Precautions: Fall Restrictions Weight Bearing Restrictions: No LLE Weight Bearing: Weight bearing as tolerated    Mobility  Bed Mobility Overal bed mobility: Needs Assistance Bed Mobility: Supine to Sit     Supine to sit: Min assist;HOB elevated     General bed mobility comments: Assist for L LE. Increased time.   Transfers Overall transfer level: Needs assistance Equipment used: Rolling walker (2 wheeled) Transfers: Sit to/from Stand Sit to Stand: Min assist;From elevated surface         General transfer comment: Assist to stabilize. Increased time.   Ambulation/Gait Ambulation/Gait assistance: Min guard Ambulation Distance (Feet): 140 Feet Assistive device: Rolling walker (2 wheeled) Gait Pattern/deviations: Step-to pattern;Step-through pattern;Antalgic;Trunk flexed     General Gait Details: close guard for safety. slow gait speed. Some difficulty advancing L LE initially.    Stairs            Wheelchair Mobility    Modified Rankin (Stroke Patients Only)       Balance                                    Cognition Arousal/Alertness: Awake/alert Behavior During Therapy: WFL for tasks assessed/performed Overall Cognitive Status: Within Functional Limits for tasks assessed                      Exercises      General Comments        Pertinent Vitals/Pain  Pain Assessment: 0-10 Pain Score: 7  Pain Location: L hip with activity Pain Descriptors / Indicators: Sore;Spasm;Aching Pain Intervention(s): Ice applied;Repositioned;Premedicated before session    Home Living                      Prior Function            PT Goals (current goals can now be found in the care plan section) Progress towards PT goals: Progressing toward goals    Frequency  7X/week    PT Plan Current plan remains appropriate    Co-evaluation             End of Session   Activity Tolerance: Patient limited by pain Patient left: in chair;with call bell/phone within reach     Time: 0902-0920 PT Time Calculation (min) (ACUTE ONLY): 18 min  Charges:  $Gait Training: 8-22 mins                    G Codes:      Weston Anna, MPT Pager: [phone]

## 2016-04-08 NOTE — Progress Notes (Signed)
OT Cancellation Note  Patient Details Name: Ashley Shepherd MRN: [id number] DOB: [birth year]   Cancelled Treatment:    Reason Eval/Treat Not Completed: OT screened, no needs identified, will sign off - Pt states she's been through this three times, and doesn't feel she needs OT this time.   Darlina Rumpf [place], OTR/L [id number]  04/08/2016, 10:16 AM

## 2016-04-08 NOTE — Progress Notes (Signed)
Pt asking for Dilaudid po d/t oxy makes her itch.Dr Lorin Mercy called,no change in orders at this time.Aggie Moats D

## 2016-04-08 NOTE — Anesthesia Postprocedure Evaluation (Signed)
Anesthesia Post Note  Patient: Ashley Shepherd  Procedure(s) Performed: Procedure(s) (LRB): REMOVAL OF TEMPORARY ANTIBIOTIC SPACER LEFT HIP, LEFT TOTAL HIP REVISION (Left)  Patient location during evaluation: PACU Anesthesia Type: General Level of consciousness: sedated Pain management: satisfactory to patient Vital Signs Assessment: post-procedure vital signs reviewed and stable Respiratory status: spontaneous breathing Cardiovascular status: stable Anesthetic complications: no    Last Vitals:  Filed Vitals:   04/07/16 1504 04/08/16 0455  BP: 106/52 126/60  Pulse: 65 76  Temp: 36.9 C 37.1 C  Resp: 15 16    Last Pain:  Filed Vitals:   04/08/16 1145  PainSc: 10-Worst pain ever                 Sriram Febles EDWARD

## 2016-04-09 ENCOUNTER — Encounter (HOSPITAL_COMMUNITY): Payer: Self-pay | Admitting: Orthopaedic Surgery

## 2016-04-09 MED ORDER — OXYCODONE-ACETAMINOPHEN 5-325 MG PO TABS: 1.0000 | ORAL_TABLET | ORAL | Status: DC | PRN

## 2016-04-09 MED ORDER — METHOCARBAMOL 500 MG PO TABS: 500.0000 mg | ORAL_TABLET | Freq: Four times a day (QID) | ORAL | Status: DC | PRN

## 2016-04-09 NOTE — Discharge Instructions (Signed)

## 2016-04-09 NOTE — Care Management Note (Signed)
Case Management Note  Patient Details  Name: Ashley Shepherd MRN: [id number] Date of Birth: 1956-04-10  Subjective/Objective:      S/P Two-stage revision of left total hip arthroplasty with removal of temporary antibiotic spacer and placement of revision acetabular and femoral components              Action/Plan: Discharge planning, spoke with patient and family at beside. Chose [facility] for [facility] services, contacted [facility] for referral. Has RW and 3-n-1 already.   Expected Discharge Date:                  Expected Discharge Plan:  [place]  In-House Referral:  NA  Discharge planning Services  CM Consult  Post Acute Care Choice:  Home Health Choice offered to:  Patient  DME Arranged:  N/A DME Agency:  NA  HH Arranged:  PT [place] Agency:  [place]  Status of Service:  Completed, signed off  Medicare Important Message Given:    Date Medicare IM Given:    Medicare IM give by:    Date Additional Medicare IM Given:    Additional Medicare Important Message give by:     If discussed at [place] of Stay Meetings, dates discussed:    Additional Comments:  Guadalupe Maple, RN 04/09/2016, 10:44 AM [phone]

## 2016-04-09 NOTE — Discharge Summary (Signed)
Patient ID: Ashley Shepherd MRN: [id number] DOB/AGE: 60-Jun-1957 60 y.o.  Admit date: 04/06/2016 Discharge date: 04/09/2016  Admission Diagnoses:  Principal Problem:   Infection of left prosthetic hip joint [facility]) Active Problems:   Status post revision of total hip replacement   Discharge Diagnoses:  Same  Past Medical History  Diagnosis Date  . HIV disease ([place])   . Cancer ([place])     7 years ago, cancer free now  . 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 Arthritis   . GERD (gastroesophageal reflux disease)   . History of kidney stones   . History of transfusion   . Anemia   . Difficulty sleeping   . Anxiety   . Pneumonia   . Depression   . Kidney stones     Surgeries: Procedure(s): REMOVAL OF TEMPORARY ANTIBIOTIC SPACER LEFT HIP, LEFT TOTAL HIP REVISION on 04/06/2016   Consultants:    Discharged Condition: Improved  Hospital Course: Ashley Shepherd is an 60 y.o. female who was admitted 04/06/2016 for operative treatment of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[place]). Patient has severe unremitting pain that affects sleep, daily activities, and work/hobbies. After pre-op clearance the patient was taken to the operating room on 04/06/2016 and underwent  Procedure(s): REMOVAL OF TEMPORARY ANTIBIOTIC SPACER LEFT HIP, LEFT TOTAL HIP REVISION.    Patient was given perioperative antibiotics: Anti-infectives    Start     Dose/Rate Route Frequency Ordered Stop   04/07/16 0100  vancomycin (VANCOCIN) IVPB 1000 mg/200 mL premix     1,000 mg 200 mL/hr over 60 Minutes Intravenous Every 12 hours 04/06/16 1756 04/07/16 0142   04/06/16 2200  dolutegravir (TIVICAY) tablet 50 mg     50 mg Oral 2 times daily 04/06/16 1756     04/06/16 2200  maraviroc (SELZENTRY) tablet 300 mg     300 mg Oral 2 times daily 04/06/16 1756     04/06/16 1830  emtricitabine-tenofovir AF (DESCOVY) 200-25 MG per tablet 1 tablet     1 tablet Oral Daily 04/06/16 1756     04/06/16 1400  vancomycin (VANCOCIN) 1,000  mg in sodium chloride 0.9 % 250 mL IVPB  Status:  Discontinued     1,000 mg 250 mL/hr over 60 Minutes Intravenous Every 12 hours 04/06/16 1352 04/06/16 1756   04/06/16 1056  ceFAZolin (ANCEF) IVPB 2g/100 mL premix  Status:  Discontinued     2 g 200 mL/hr over 30 Minutes Intravenous On call to O.R. 04/06/16 1056 04/06/16 1742       Patient was given sequential compression devices, early ambulation, and chemoprophylaxis to prevent DVT.  Patient benefited maximally from hospital stay and there were no complications.    Recent vital signs: Patient Vitals for the past 24 hrs:  BP Temp Temp src Pulse Resp SpO2  04/09/16 0617 (!) 108/54 mmHg 98.9 F (37.2 C) Oral 77 18 97 %  04/08/16 1952 119/65 mmHg 100.2 F (37.9 C) Oral 68 19 100 %  04/08/16 1600 115/63 mmHg 99.5 F (37.5 C) Oral 76 18 97 %     Recent laboratory studies:  Recent Labs  04/07/16 0416  WBC 10.1  HGB 11.7*  HCT 36.1  PLT 198  NA 142  K 4.9  CL 105  CO2 29  BUN 20  CREATININE 1.32*  GLUCOSE 178*  CALCIUM 9.2     Discharge Medications:     Medication List    STOP taking these medications  Oxycodone HCl 10 MG Tabs      TAKE these medications        ALPRAZolam 1 MG tablet  Commonly known as:  XANAX  Take 1 mg by mouth at bedtime as needed for anxiety.     aspirin 325 MG EC tablet  Take 1 tablet (325 mg total) by mouth 2 (two) times daily after a meal.     CALCIUM + D PO  Take 1 tablet by mouth daily.     ceFAZolin 2-3 GM-% Solr  Commonly known as:  ANCEF  Inject 50 mLs (2 g total) into the vein every 8 (eight) hours.     citalopram 20 MG tablet  Commonly known as:  CELEXA  Take 20 mg by mouth every morning.     DESCOVY 200-25 MG tablet  Generic drug:  emtricitabine-tenofovir AF  Take 1 tablet by mouth daily.     ferrous sulfate 325 (65 FE) MG tablet  Take 325 mg by mouth daily with breakfast.     ferrous sulfate 325 (65 FE) MG tablet  Take 1 tablet (325 mg total) by mouth 3  (three) times daily with meals.     maraviroc 300 MG tablet  Commonly known as:  SELZENTRY  Take 300 mg by mouth 2 (two) times daily. Reported on 01/24/2016     methocarbamol 500 MG tablet  Commonly known as:  ROBAXIN  Take 1 tablet (500 mg total) by mouth every 6 (six) hours as needed for muscle spasms.     multivitamin with minerals Tabs tablet  Take 1 tablet by mouth daily.     oxyCODONE-acetaminophen 5-325 MG tablet  Commonly known as:  ROXICET  Take 1-2 tablets by mouth every 4 (four) hours as needed.     TIVICAY 50 MG tablet  Generic drug:  dolutegravir  Take 50 mg by mouth 2 (two) times daily.     tiZANidine 4 MG tablet  Commonly known as:  ZANAFLEX  Take 4 mg by mouth 3 (three) times daily as needed for muscle spasms.        Diagnostic Studies: Dg C-arm Gt 120 Min-no Report  04/06/2016  CLINICAL DATA: surgery C-ARM GT 120 MINUTE Fluoroscopy was utilized by the requesting physician.  No radiographic interpretation.   Dg Hip Operative Unilat W Or W/o Pelvis Left  04/06/2016  CLINICAL DATA:  Left total hip replacement revision EXAM: OPERATIVE left HIP   1 VIEW TECHNIQUE: Fluoroscopic spot image(s) were submitted for interpretation post-operatively. FLUOROSCOPY TIME:  0 minutes 42 seconds; 4 acquired images COMPARISON:  January 24, 2016 FINDINGS: There is a total hip replacement on the left with prosthetic components appearing well-seated on frontal view. No fracture or dislocation. IMPRESSION: Left total hip prosthesis appears well-seated on frontal view. No demonstrable fracture or dislocation. Electronically Signed   By: Ashley Grip III M.D.   On: 04/06/2016 15:57    Disposition: 01-Home or Self Care      Discharge Instructions    Discharge patient    Complete by:  As directed               Signed: Mcarthur Shepherd 04/09/2016, 7:15 AM

## 2016-04-09 NOTE — Progress Notes (Addendum)
Physical Therapy Treatment Patient Details Name: Ashley Shepherd MRN: [id number] DOB: [birth year] Today's Date: 04/09/2016    History of Present Illness Pt is a 60 year old female s/p removal of L hip temporary antibiotic spacer and placement of revision acetabular and femoral components.  PMHx: HIV    PT Comments    Progressing well with mobility. Ready to d/c from PT standpoint.   Follow Up Recommendations  Home health PT     Equipment Recommendations  None recommended by PT    Recommendations for Other Services       Precautions / Restrictions Precautions Precautions: Fall Restrictions Weight Bearing Restrictions: No LLE Weight Bearing: Weight bearing as tolerated    Mobility  Bed Mobility               General bed mobility comments: oob in recliner  Transfers Overall transfer level: Needs assistance Equipment used: Rolling walker (2 wheeled) Transfers: Sit to/from Stand Sit to Stand: Supervision         General transfer comment: for safety  Ambulation/Gait Ambulation/Gait assistance: Supervision Ambulation Distance (Feet): 100 Feet Assistive device: Rolling walker (2 wheeled) Gait Pattern/deviations: Step-through pattern;Trunk flexed     General Gait Details: for safety.    Stairs  Min guard assist; 2 steps; 2 rails. VCs safety, sequence.           Wheelchair Mobility    Modified Rankin (Stroke Patients Only)       Balance                                    Cognition Arousal/Alertness: Awake/alert Behavior During Therapy: WFL for tasks assessed/performed Overall Cognitive Status: Within Functional Limits for tasks assessed                      Exercises Total Joint Exercises Quad Sets: AROM;Both;10 reps;Supine Heel Slides: AAROM;Left;10 reps;Supine Hip ABduction/ADduction: AAROM;Left;10 reps;Supine Long Arc Quad: AROM;Left;10 reps;Seated    General Comments        Pertinent Vitals/Pain Pain  Assessment: 0-10 Pain Score: 5  Pain Location: L hip with activity Pain Descriptors / Indicators: Sore Pain Intervention(s): Monitored during session;Repositioned    Home Living                      Prior Function            PT Goals (current goals can now be found in the care plan section) Progress towards PT goals: Progressing toward goals    Frequency  7X/week    PT Plan Current plan remains appropriate    Co-evaluation             End of Session   Activity Tolerance: Patient tolerated treatment well Patient left: in chair;with call bell/phone within reach;with family/visitor present     Time: [id number] PT Time Calculation (min) (ACUTE ONLY): 16 min  Charges:  $Gait Training: 8-22 mins                    G Codes:      Weston Anna, MPT Pager: [phone]

## 2016-04-11 ENCOUNTER — Encounter: Payer: Self-pay | Admitting: *Deleted

## 2016-07-03 IMAGING — RF DG HIP (WITH PELVIS) OPERATIVE*L*
1 series · 4 of 4 positions shown · non-contrast
Comparison: January 24, 2016

CLINICAL DATA: Left total hip replacement revision

EXAM:
OPERATIVE left HIP   1 VIEW
TECHNIQUE: Fluoroscopic spot image(s) were submitted for interpretation
post-operatively.
FLUOROSCOPY TIME:  0 minutes 42 seconds; 4 acquired images

[Series 1: run · 4 of 4 slices shown]
[im 1/4]
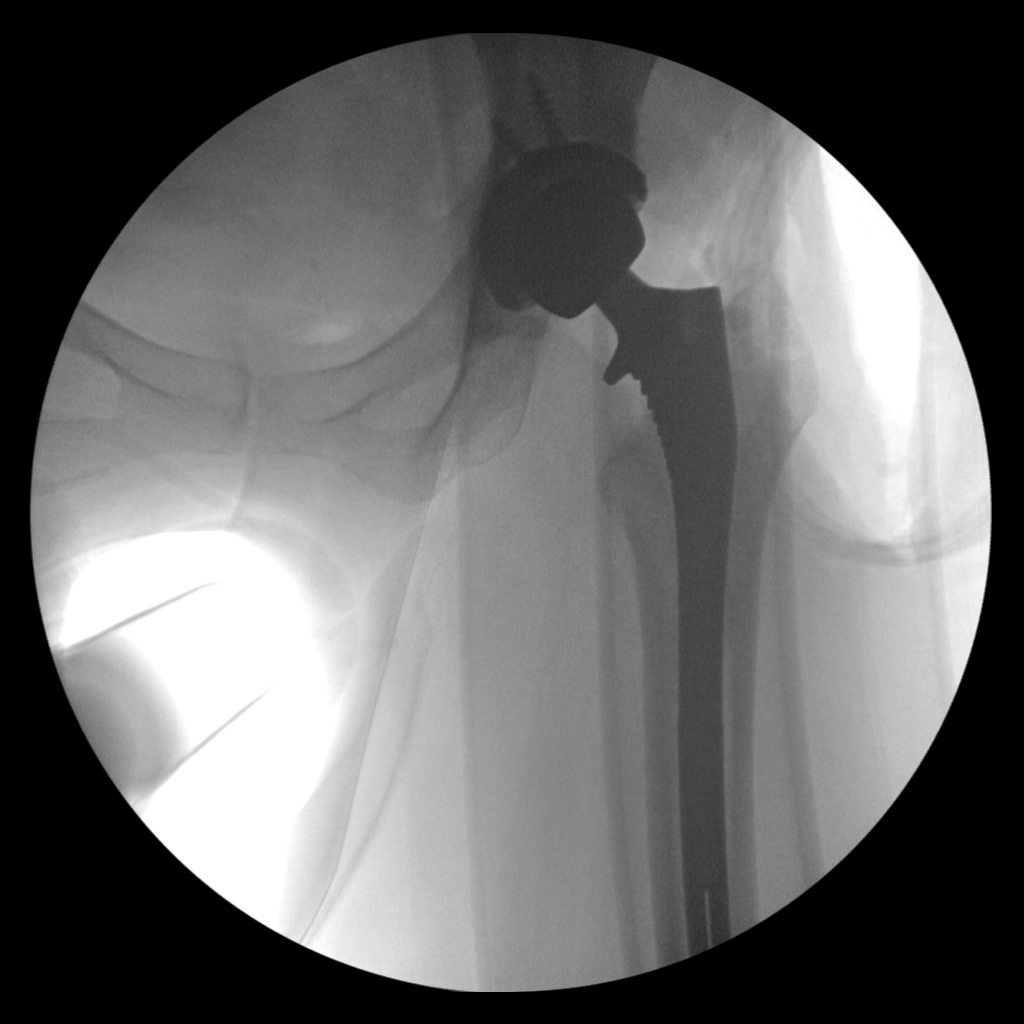
[im 2/4]
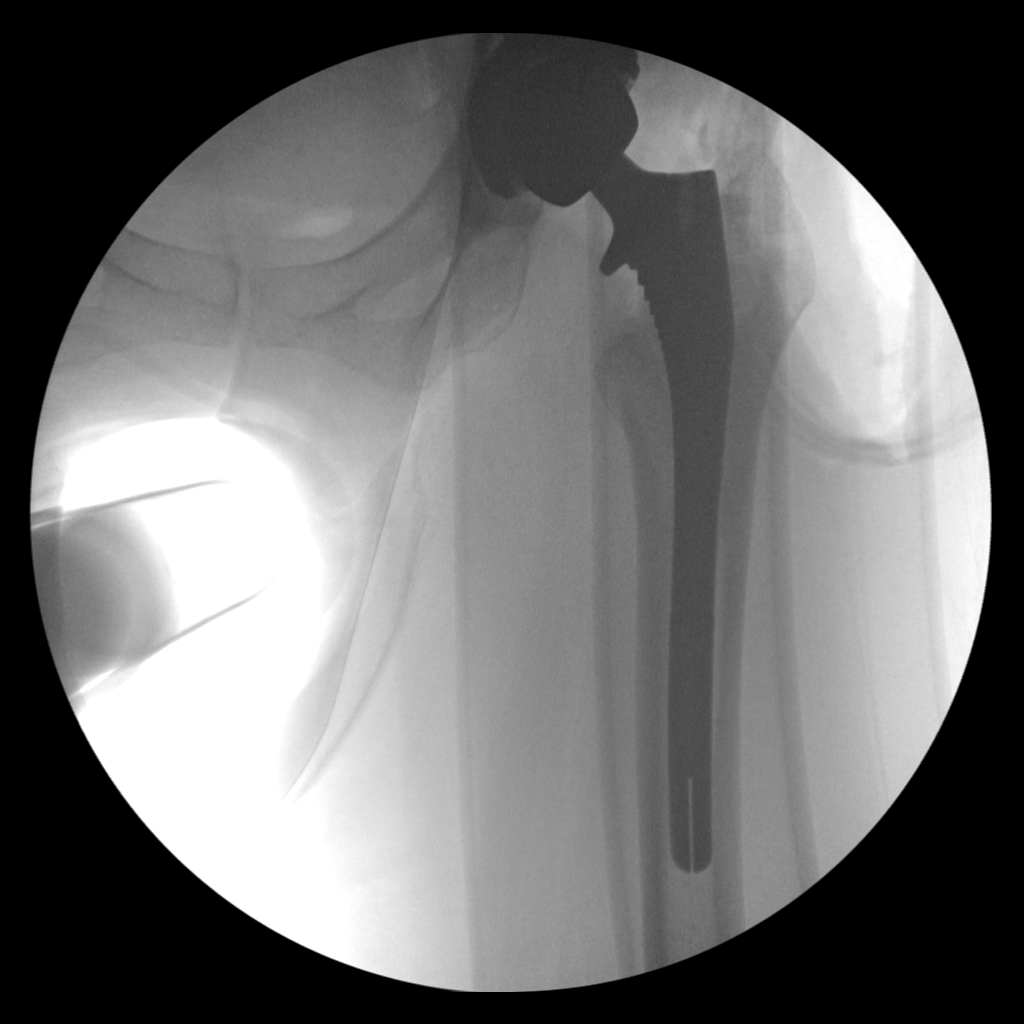
[im 3/4]
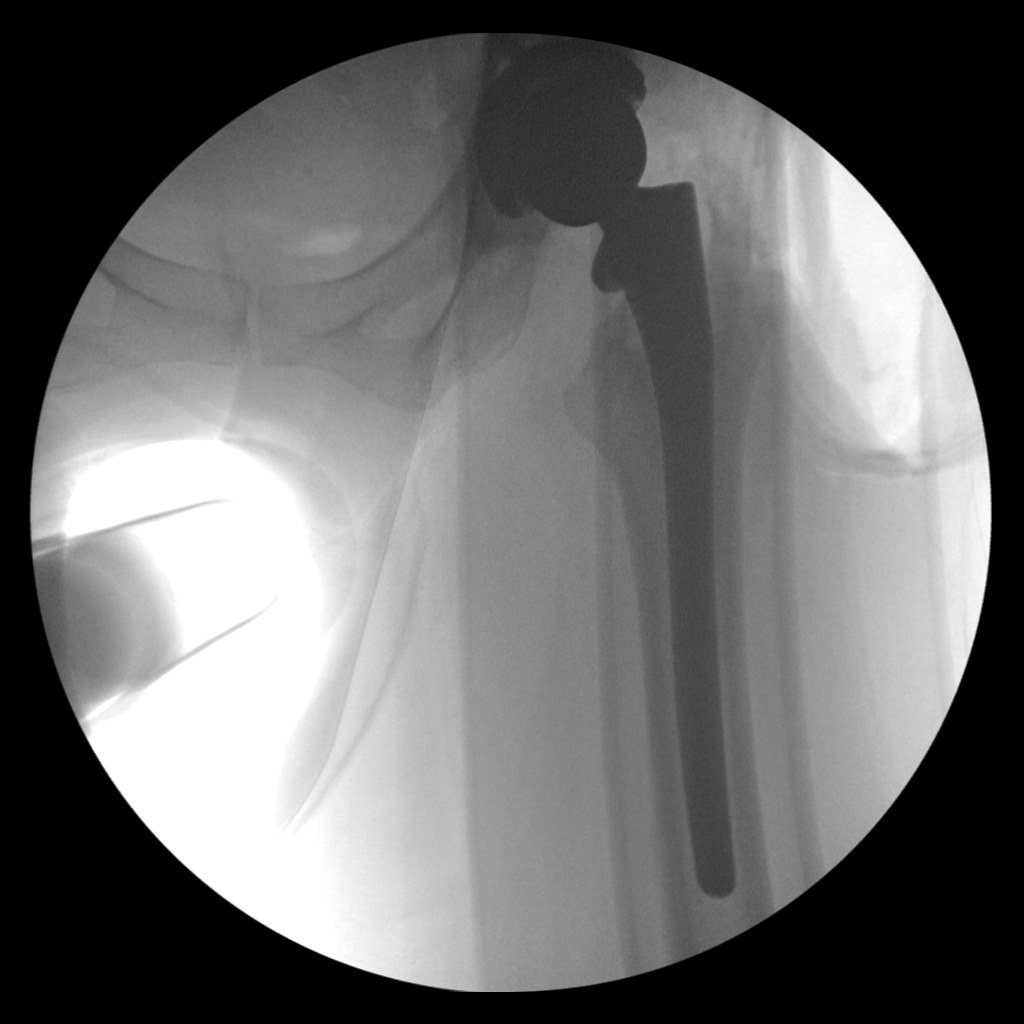
[im 4/4]
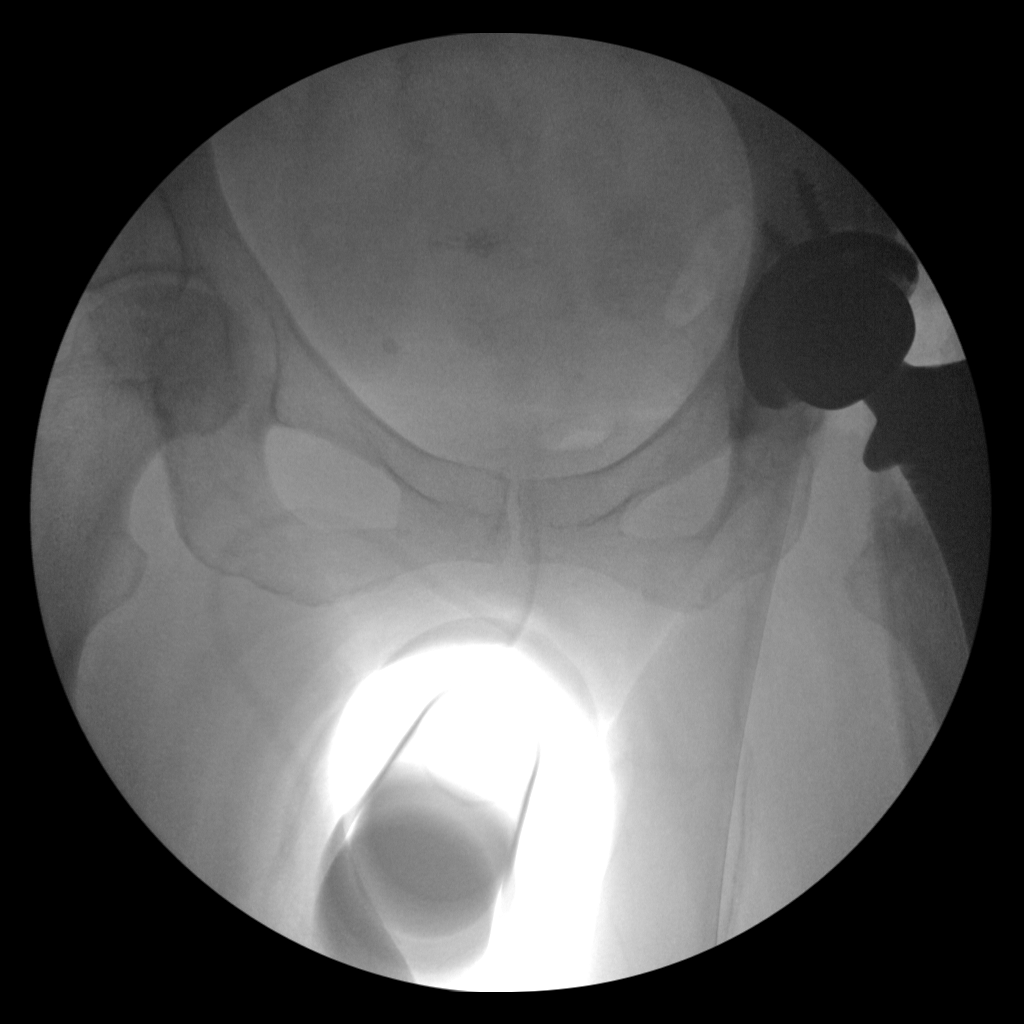

[4 of 4 positions shown; findings below may reference images not displayed]

FINDINGS: There is a total hip replacement on the left with prosthetic
components appearing well-seated on frontal view. No fracture or
dislocation.
IMPRESSION: Left total hip prosthesis appears well-seated on frontal view. No
demonstrable fracture or dislocation.

## 2016-08-09 ENCOUNTER — Emergency Department (HOSPITAL_BASED_OUTPATIENT_CLINIC_OR_DEPARTMENT_OTHER): Payer: Medicare Other

## 2016-08-09 ENCOUNTER — Emergency Department (HOSPITAL_BASED_OUTPATIENT_CLINIC_OR_DEPARTMENT_OTHER)
Admission: EM | Admit: 2016-08-09 | Discharge: 2016-08-09 | Disposition: A | Discharge status: Home / Self Care | Payer: Medicare Other | Attending: Emergency Medicine | Admitting: Emergency Medicine

## 2016-08-09 ENCOUNTER — Encounter (HOSPITAL_BASED_OUTPATIENT_CLINIC_OR_DEPARTMENT_OTHER): Payer: Self-pay

## 2016-08-09 DIAGNOSIS — H6123 Impacted cerumen, bilateral: Secondary | ICD-10-CM | POA: Insufficient documentation

## 2016-08-09 DIAGNOSIS — Z85038 Personal history of other malignant neoplasm of large intestine: Secondary | ICD-10-CM | POA: Diagnosis not present

## 2016-08-09 DIAGNOSIS — F17210 Nicotine dependence, cigarettes, uncomplicated: Secondary | ICD-10-CM | POA: Diagnosis not present

## 2016-08-09 DIAGNOSIS — H9203 Otalgia, bilateral: Secondary | ICD-10-CM | POA: Diagnosis present

## 2016-08-09 DIAGNOSIS — J029 Acute pharyngitis, unspecified: Secondary | ICD-10-CM

## 2016-08-09 DIAGNOSIS — Z7982 Long term (current) use of aspirin: Secondary | ICD-10-CM | POA: Insufficient documentation

## 2016-08-09 DIAGNOSIS — N183 Chronic kidney disease, stage 3 (moderate): Secondary | ICD-10-CM | POA: Diagnosis not present

## 2016-08-09 LAB — RAPID STREP SCREEN (MED CTR MEBANE ONLY): STREPTOCOCCUS, GROUP A SCREEN (DIRECT): NEGATIVE

## 2016-08-09 MED ORDER — OXYCODONE-ACETAMINOPHEN 5-325 MG PO TABS
1.0000 | ORAL_TABLET | Freq: Once | ORAL | Status: AC
  Administered 2016-08-09: 1 via ORAL
  Filled 2016-08-09: qty 1

## 2016-08-09 MED ORDER — DEXAMETHASONE 6 MG PO TABS
12.0000 mg | ORAL_TABLET | Freq: Once | ORAL | Status: AC
  Administered 2016-08-09: 12 mg via ORAL
  Filled 2016-08-09: qty 2

## 2016-08-09 MED ORDER — ONDANSETRON HCL 4 MG PO TABS
4.0000 mg | ORAL_TABLET | Freq: Four times a day (QID) | ORAL | 0 refills | Status: DC | PRN
Start: 2016-08-09 — End: 2018-04-09

## 2016-08-09 MED ORDER — OXYCODONE-ACETAMINOPHEN 5-325 MG PO TABS: 1.0000 | ORAL_TABLET | ORAL | 0 refills | Status: DC | PRN

## 2016-08-09 NOTE — ED Notes (Signed)
Pt verbalizes understanding of d/c instructions and denies any further needs at this time. 

## 2016-08-09 NOTE — ED Triage Notes (Signed)
Pt c/o right ear pain into right jaw with a dry cough for the last two days unrelieved by tylenol at home

## 2016-08-09 NOTE — ED Provider Notes (Signed)
[place] DEPT [facility] Provider Note   CSN: [id number] Arrival date & time: 08/09/16  0446     History   Chief Complaint Chief Complaint  Patient presents with  . Otalgia    HPI Ashley Shepherd is a 60 y.o. female.  The history is provided by the patient.  She complains of sore throat and pain in her right ear for the last 2 days. Pain is worse with swallowing. There is also a nonproductive cough. She denies fever, chills, sweats. She denies any sick contacts. She is worried because she has a prosthetic hip and it had to be replaced because of infection that settled there from a prior ear infection.  Past Medical History:  Diagnosis Date  . Anemia   . Anxiety   . Arthritis   . 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[place])    left  . Cancer ([place])    7 years ago, cancer free now  . Depression   . Difficulty sleeping   . GERD (gastroesophageal reflux disease)   . History of kidney stones   . History of transfusion   . HIV disease ([place])   . Kidney stones   . Pneumonia     Patient Active Problem List   Diagnosis Date Noted  . Status post revision of total hip replacement 04/06/2016  . HIV disease ([place])   . CKD (chronic kidney disease) stage 3, GFR 30-59 ml/min   . 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 Avascular necrosis of bone of left hip ([place]) 10/01/2014  . 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 FB GI (foreign body in gastrointestinal tract) 04/09/2014    Past Surgical History:  Procedure Laterality Date  . ANTERIOR HIP REVISION Left 04/06/2016   Procedure: REMOVAL OF TEMPORARY ANTIBIOTIC SPACER LEFT HIP, LEFT TOTAL HIP REVISION;  Surgeon: Mcarthur Rossetti, MD;  Location: [facility];  Service: Orthopedics;  Laterality: Left;  . APPENDECTOMY    . COLON SURGERY  2008  . COLOSTOMY REVERSAL  2013  . EXCISIONAL TOTAL HIP ARTHROPLASTY WITH ANTIBIOTIC SPACERS Left 01/24/2016   Procedure: EXCISION OF LEFT TOTAL HIP ARTHROPLASTY WITH PLACEMENT OF ANTIBIOTIC SPACERS;  Surgeon: Mcarthur Rossetti, MD;  Location: [place];  Service: Orthopedics;  Laterality: Left;  . INCISION AND DRAINAGE HIP Left 01/24/2016   Procedure: IRRIGATION AND DEBRIDEMENT LEFT HIP;  Surgeon: Mcarthur Rossetti, MD;  Location: [place];  Service: Orthopedics;  Laterality: Left;  . KNEE ARTHROSCOPY     left  . PORT-A-CATH REMOVAL    . TOTAL HIP ARTHROPLASTY Left 10/01/2014   Procedure: LEFT TOTAL HIP ARTHROPLASTY ANTERIOR APPROACH;  Surgeon: Mcarthur Rossetti, MD;  Location: [facility];  Service: Orthopedics;  Laterality: Left;    OB History    No data available       Home Medications    Prior to Admission medications   Medication Sig Start Date End Date Taking? Authorizing Provider  ALPRAZolam Duanne Moron) 1 MG tablet Take 1 mg by mouth at bedtime as needed for anxiety.    Historical Provider, MD  aspirin 325 MG EC tablet Take 1 tablet (325 mg total) by mouth 2 (two) times daily after a meal. Patient not taking: Reported on 03/28/2016 01/26/16   Mcarthur Rossetti, MD  Calcium Citrate-Vitamin D (CALCIUM + D PO) Take 1 tablet by mouth daily.    Historical Provider, MD  ceFAZolin (ANCEF) 2-3 GM-% SOLR  Inject 50 mLs (2 g total) into the vein every 8 (eight) hours. Patient not taking: Reported on 03/28/2016 01/26/16   Mcarthur Rossetti, MD  citalopram (CELEXA) 20 MG tablet Take 20 mg by mouth every morning.     Historical Provider, MD  dolutegravir (TIVICAY) 50 MG tablet Take 50 mg by mouth 2 (two) times daily.  05/02/15   Historical Provider, MD  emtricitabine-tenofovir AF (DESCOVY) 200-25 MG tablet Take 1 tablet by mouth daily. 12/05/15   Historical Provider, MD  ferrous sulfate 325 (65 FE) MG tablet Take 1 tablet (325 mg total) by mouth 3 (three) times daily with meals. Patient not taking: Reported on 03/28/2016 10/03/14   Mcarthur Rossetti, MD  ferrous sulfate 325 (65 FE) MG tablet Take 325  mg by mouth daily with breakfast.    Historical Provider, MD  maraviroc (SELZENTRY) 300 MG tablet Take 300 mg by mouth 2 (two) times daily. Reported on 01/24/2016    Historical Provider, MD  methocarbamol (ROBAXIN) 500 MG tablet Take 1 tablet (500 mg total) by mouth every 6 (six) hours as needed for muscle spasms. 04/09/16   Mcarthur Rossetti, MD  Multiple Vitamin (MULTIVITAMIN WITH MINERALS) TABS tablet Take 1 tablet by mouth daily.    Historical Provider, MD  oxyCODONE-acetaminophen (ROXICET) 5-325 MG tablet Take 1-2 tablets by mouth every 4 (four) hours as needed. 04/09/16   Mcarthur Rossetti, MD  tiZANidine (ZANAFLEX) 4 MG tablet Take 4 mg by mouth 3 (three) times daily as needed for muscle spasms.    Historical Provider, MD    Family History Family History  Problem Relation Age of Onset  . Hypertension Father   . Diabetes Father   . Heart attack Father   . Hypertension Other   . Diabetes Other     Social History Social History  Substance Use Topics  . Smoking status: Current Every Day Smoker    Packs/day: 0.50    Types: Cigarettes  . Smokeless tobacco: Never Used  . Alcohol use No     Allergies   Codeine; Ivp dye [iodinated diagnostic agents]; and Sulfa antibiotics   Review of Systems Review of Systems  All other systems reviewed and are negative.    Physical Exam Updated Vital Signs BP 144/87 (BP Location: Right Arm)   Pulse 72   Temp 98.2 F (36.8 C) (Oral)   Resp 16   Ht 5\' 10"  (1.778 m)   Wt 199 lb (90.3 kg)   SpO2 98%   BMI 28.55 kg/m   Physical Exam  Nursing note and vitals reviewed.  60 year old female, resting comfortably and in no acute distress. Vital signs are significant for mild hypertension. Oxygen saturation is 98%, which is normal. Head is normocephalic and atraumatic. PERRLA, EOMI. Tympanic membranes are obscured by cerumen. Oropharynx is clear. Neck is nontender and supple without adenopathy or JVD. Back is nontender and there is  no CVA tenderness. Lungs are clear without rales, wheezes, or rhonchi. Chest is nontender. Heart has regular rate and rhythm without murmur. Abdomen is soft, flat, nontender without masses or hepatosplenomegaly and peristalsis is normoactive. Extremities have no cyanosis or edema, full range of motion is present. Skin is warm and dry without rash. Neurologic: Mental status is normal, cranial nerves are intact, there are no motor or sensory deficits.  ED Treatments / Results  Labs (all labs ordered are listed, but only abnormal results are displayed) Labs Reviewed  RAPID STREP SCREEN (NOT AT [facility])  CULTURE,  GROUP A STREP [facility])    Radiology Dg Chest 2 View  Result Date: 08/09/2016 CLINICAL DATA:  60 y/o F; right ear, right jaw, and throat pain for 2 days. History of colon cancer. EXAM: CHEST  2 VIEW COMPARISON:  Chest radiograph dated 11/28/2013. FINDINGS: Stable cardiac silhouette. No consolidation, pneumothorax, or effusion. Stable right lung base granuloma. Mild degenerative changes of the thoracic spine. IMPRESSION: No active cardiopulmonary disease. Electronically Signed   By: Kristine Garbe M.D.   On: 08/09/2016 05:40    Procedures .Ear Cerumen Removal Date/Time: 08/09/2016 5:50 AM Performed by: Delora Fuel Authorized by: Roxanne Mins, Frida Wahlstrom   Consent:    Consent obtained:  Verbal   Consent given by:  Patient   Risks discussed:  Bleeding, infection, incomplete removal, TM perforation, pain and dizziness   Alternatives discussed:  No treatment and referral Procedure details:    Location:  L ear and R ear   Procedure type: irrigation   Post-procedure details:    Inspection:  TM intact   Post-procedure hearing quality: Improved in the right ear, diminished in the left ear.   Patient tolerance of procedure:  Tolerated well, no immediate complications Comments:     Successful removal of cerumen impaction from the right ear, inadequate cerumen removal from the left ear.     (including critical care time)  Medications Ordered in ED Medications  oxyCODONE-acetaminophen (PERCOCET/ROXICET) 5-325 MG per tablet 1 tablet (not administered)  dexamethasone (DECADRON) tablet 12 mg (12 mg Oral Given 08/09/16 [id number])     Initial Impression / Assessment and Plan / ED Course  I have reviewed the triage vital signs and the nursing notes.  Pertinent labs & imaging results that were available during my care of the patient were reviewed by me and considered in my medical decision making (see chart for details).  Clinical Course    Probable viral pharyngitis. Strep screen is sent. Cerumen is obscuring view of tympanic membranes and is removed by irrigation. Old records are reviewed and confirmed that her hip infection was with Streptococcus agalactiae.  Chest x-[place] shows no evidence of pneumonia. Strep screen is negative, culture pending. After cerumen was cleared from the right external auditory canal, tympanic membrane was easily visualized and was noted to be completely normal. Cerumen impaction from the left ear was not able to be removed and she is being referred to ENT.  Final Clinical Impressions(s) / ED Diagnoses   Final diagnoses:  Pharyngitis  Cerumen impaction, bilateral    New Prescriptions New Prescriptions   OXYCODONE-ACETAMINOPHEN (PERCOCET) 5-325 MG TABLET    Take 1 tablet by mouth every 4 (four) hours as needed for moderate pain.     Delora Fuel, MD [id number]

## 2016-08-11 LAB — CULTURE, GROUP A STREP (THRC)

## 2016-09-06 ENCOUNTER — Encounter (HOSPITAL_BASED_OUTPATIENT_CLINIC_OR_DEPARTMENT_OTHER): Payer: Self-pay | Admitting: Emergency Medicine

## 2016-09-06 ENCOUNTER — Emergency Department (HOSPITAL_BASED_OUTPATIENT_CLINIC_OR_DEPARTMENT_OTHER): Payer: Medicare Other

## 2016-09-06 ENCOUNTER — Emergency Department (HOSPITAL_BASED_OUTPATIENT_CLINIC_OR_DEPARTMENT_OTHER)
Admission: EM | Admit: 2016-09-06 | Discharge: 2016-09-06 | Disposition: A | Discharge status: Other Acute Inpatient Hospital | Payer: Medicare Other | Attending: Emergency Medicine | Admitting: Emergency Medicine

## 2016-09-06 DIAGNOSIS — F17210 Nicotine dependence, cigarettes, uncomplicated: Secondary | ICD-10-CM | POA: Diagnosis not present

## 2016-09-06 DIAGNOSIS — R748 Abnormal levels of other serum enzymes: Secondary | ICD-10-CM | POA: Diagnosis not present

## 2016-09-06 DIAGNOSIS — Z859 Personal history of malignant neoplasm, unspecified: Secondary | ICD-10-CM | POA: Insufficient documentation

## 2016-09-06 DIAGNOSIS — R1010 Upper abdominal pain, unspecified: Secondary | ICD-10-CM | POA: Diagnosis present

## 2016-09-06 DIAGNOSIS — Z21 Asymptomatic human immunodeficiency virus [HIV] infection status: Secondary | ICD-10-CM | POA: Diagnosis not present

## 2016-09-06 DIAGNOSIS — R110 Nausea: Secondary | ICD-10-CM | POA: Diagnosis not present

## 2016-09-06 DIAGNOSIS — K5669 Other intestinal obstruction: Secondary | ICD-10-CM | POA: Diagnosis not present

## 2016-09-06 DIAGNOSIS — K56609 Unspecified intestinal obstruction, unspecified as to partial versus complete obstruction: Secondary | ICD-10-CM

## 2016-09-06 DIAGNOSIS — R112 Nausea with vomiting, unspecified: Secondary | ICD-10-CM

## 2016-09-06 LAB — CBC WITH DIFFERENTIAL/PLATELET
BASOS PCT: 0 %
Basophils Absolute: 0 10*3/uL (ref 0.0–0.1)
EOS ABS: 0.1 10*3/uL (ref 0.0–0.7)
EOS PCT: 1 %
HCT: 41.4 % (ref 36.0–46.0)
HEMOGLOBIN: 14.4 g/dL (ref 12.0–15.0)
LYMPHS ABS: 1.3 10*3/uL (ref 0.7–4.0)
Lymphocytes Relative: 16 %
MCH: 31.4 pg (ref 26.0–34.0)
MCHC: 34.8 g/dL (ref 30.0–36.0)
MCV: 90.2 fL (ref 78.0–100.0)
MONO ABS: 0.8 10*3/uL (ref 0.1–1.0)
MONOS PCT: 10 %
NEUTROS PCT: 73 %
Neutro Abs: 5.6 10*3/uL (ref 1.7–7.7)
PLATELETS: 165 10*3/uL (ref 150–400)
RBC: 4.59 MIL/uL (ref 3.87–5.11)
RDW: 14.9 % (ref 11.5–15.5)
WBC: 7.8 10*3/uL (ref 4.0–10.5)

## 2016-09-06 LAB — COMPREHENSIVE METABOLIC PANEL
ALBUMIN: 3.7 g/dL (ref 3.5–5.0)
ALK PHOS: 115 U/L (ref 38–126)
ALT: 10 U/L — ABNORMAL LOW (ref 14–54)
ANION GAP: 7 (ref 5–15)
AST: 22 U/L (ref 15–41)
BILIRUBIN TOTAL: 0.9 mg/dL (ref 0.3–1.2)
BUN: 19 mg/dL (ref 6–20)
CALCIUM: 9.4 mg/dL (ref 8.9–10.3)
CO2: 31 mmol/L (ref 22–32)
Chloride: 104 mmol/L (ref 101–111)
Creatinine, Ser: 1.23 mg/dL — ABNORMAL HIGH (ref 0.44–1.00)
GFR, EST AFRICAN AMERICAN: 54 mL/min — AB (ref >60)
GFR, EST NON AFRICAN AMERICAN: 47 mL/min — AB (ref >60)
GLUCOSE: 160 mg/dL — AB (ref 65–99)
POTASSIUM: 3.7 mmol/L (ref 3.5–5.1)
SODIUM: 142 mmol/L (ref 135–145)
TOTAL PROTEIN: 7.6 g/dL (ref 6.5–8.1)

## 2016-09-06 LAB — URINALYSIS, ROUTINE W REFLEX MICROSCOPIC
Bilirubin Urine: NEGATIVE
GLUCOSE, UA: NEGATIVE mg/dL
HGB URINE DIPSTICK: NEGATIVE
Ketones, ur: NEGATIVE mg/dL
NITRITE: NEGATIVE
PROTEIN: NEGATIVE mg/dL
SPECIFIC GRAVITY, URINE: 1.017 (ref 1.005–1.030)
pH: 6 (ref 5.0–8.0)

## 2016-09-06 LAB — URINE MICROSCOPIC-ADD ON: RBC / HPF: NONE SEEN RBC/hpf (ref 0–5)

## 2016-09-06 LAB — LIPASE, BLOOD: LIPASE: 181 U/L — AB (ref 11–51)

## 2016-09-06 LAB — TROPONIN I: Troponin I: <0.03 ng/mL (ref <0.03)

## 2016-09-06 MED ORDER — HYDROMORPHONE HCL 1 MG/ML IJ SOLN
1.0000 mg | Freq: Once | INTRAMUSCULAR | Status: AC
  Administered 2016-09-06: 1 mg via INTRAVENOUS
  Filled 2016-09-06: qty 1

## 2016-09-06 MED ORDER — ONDANSETRON HCL 4 MG/2ML IJ SOLN
4.0000 mg | Freq: Once | INTRAMUSCULAR | Status: AC
  Administered 2016-09-06: 4 mg via INTRAVENOUS

## 2016-09-06 MED ORDER — MORPHINE SULFATE (PF) 4 MG/ML IV SOLN
4.0000 mg | Freq: Once | INTRAVENOUS | Status: AC
  Administered 2016-09-06: 4 mg via INTRAVENOUS
  Filled 2016-09-06: qty 1

## 2016-09-06 MED ORDER — HYDROMORPHONE HCL 1 MG/ML IJ SOLN
1.0000 mg | Freq: Once | INTRAMUSCULAR | Status: AC
  Administered 2016-09-06: 1 mg via INTRAVENOUS

## 2016-09-06 MED ORDER — ONDANSETRON HCL 4 MG/2ML IJ SOLN
INTRAMUSCULAR | Status: AC
  Filled 2016-09-06: qty 2

## 2016-09-06 MED ORDER — HYDROMORPHONE HCL 1 MG/ML IJ SOLN: 1.0000 mg | INTRAMUSCULAR | Status: DC | PRN

## 2016-09-06 MED ORDER — BENZOCAINE 20 % MT AERO
INHALATION_SPRAY | Freq: Once | OROMUCOSAL | Status: AC
  Administered 2016-09-06: 1 via OROMUCOSAL
  Filled 2016-09-06: qty 57

## 2016-09-06 MED ORDER — ONDANSETRON HCL 4 MG/2ML IJ SOLN: 4.0000 mg | Freq: Four times a day (QID) | INTRAMUSCULAR | Status: DC | PRN

## 2016-09-06 MED ORDER — SODIUM CHLORIDE 0.9 % IV BOLUS (SEPSIS)
1000.0000 mL | Freq: Once | INTRAVENOUS | Status: AC
  Administered 2016-09-06: 1000 mL via INTRAVENOUS

## 2016-09-06 MED ORDER — HYDROMORPHONE HCL 1 MG/ML IJ SOLN
INTRAMUSCULAR | Status: AC
  Filled 2016-09-06: qty 1

## 2016-09-06 MED ORDER — ONDANSETRON HCL 4 MG/2ML IJ SOLN
4.0000 mg | Freq: Once | INTRAMUSCULAR | Status: AC
  Administered 2016-09-06: 4 mg via INTRAVENOUS
  Filled 2016-09-06: qty 2

## 2016-09-06 MED ORDER — SODIUM CHLORIDE 0.9 % IV SOLN
INTRAVENOUS | Status: DC
  Administered 2016-09-06: 06:00:00 via INTRAVENOUS

## 2016-09-06 MED ORDER — LIDOCAINE VISCOUS 2 % MT SOLN
15.0000 mL | Freq: Once | OROMUCOSAL | Status: AC
  Administered 2016-09-06: 15 mL via OROMUCOSAL
  Filled 2016-09-06: qty 15

## 2016-09-06 NOTE — ED Notes (Signed)
Report received, pt care assumed. Pt resting quietly in nad, states "I feel much better!" rates pain at 6/10. Updated on hp1 transport around 8am

## 2016-09-06 NOTE — ED Triage Notes (Signed)
Epigastric pain since 4pm with vomiting and bloating.  Hx of colostomy that was reversed approx 7 years ago and she reports having repeated problems like this ever since.

## 2016-09-06 NOTE — ED Notes (Signed)
CT will be done at 0500, pt made aware.

## 2016-09-06 NOTE — ED Provider Notes (Signed)
TIME SEEN: 2:25 AM  CHIEF COMPLAINT: Abdominal pain, vomiting  HPI: Pt is a 60 y.o. female with history of HIV, colon cancer status post resection, colostomy and then reversal, history of early bowel obstruction who presents emergency department with upper abdominal pain that she describes as feeling like a "knot" that started earlier yesterday. She has had multilevel episodes of nonbloody, nonbilious vomiting. Last bowel movement was before 3 PM and she states she does not feel she is passing gas. Feels like her abdomen is distended. States she's been taking emergency department before for these symptoms and was told she could've gastritis or pancreatitis. Never had an endoscopy. Denies bloody stools, melena. States she took Mylanta and Prilosec at home without relief. No aggravating factors that she is aware of. No chest pain or shortness of breath. No fevers or chills. No dysuria, hematuria, vaginal bleeding or discharge. She is status post appendectomy.  ROS: See HPI Constitutional: no fever  Eyes: no drainage  ENT: no runny nose   Cardiovascular:  no chest pain  Resp: no SOB  GI: no vomiting GU: no dysuria Integumentary: no rash  Allergy: no hives  Musculoskeletal: no leg swelling  Neurological: no slurred speech ROS otherwise negative  PAST MEDICAL HISTORY/PAST SURGICAL HISTORY:  Past Medical History:  Diagnosis Date  . Anemia   . Anxiety   . Arthritis   . 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[place])    left  . Cancer ([place])    7 years ago, cancer free now  . Depression   . Difficulty sleeping   . GERD (gastroesophageal reflux disease)   . History of kidney stones   . History of transfusion   . HIV disease ([place])   . Kidney stones   . Pneumonia     MEDICATIONS:  Prior to Admission medications   Medication Sig Start Date End Date Taking? Authorizing Provider  ALPRAZolam Duanne Moron) 1 MG tablet Take 1 mg by mouth at bedtime as needed for anxiety.    Historical Provider, MD  aspirin 325  MG EC tablet Take 1 tablet (325 mg total) by mouth 2 (two) times daily after a meal. Patient not taking: Reported on 03/28/2016 01/26/16   Mcarthur Rossetti, MD  Calcium Citrate-Vitamin D (CALCIUM + D PO) Take 1 tablet by mouth daily.    Historical Provider, MD  citalopram (CELEXA) 20 MG tablet Take 20 mg by mouth every morning.     Historical Provider, MD  dolutegravir (TIVICAY) 50 MG tablet Take 50 mg by mouth 2 (two) times daily.  05/02/15   Historical Provider, MD  emtricitabine-tenofovir AF (DESCOVY) 200-25 MG tablet Take 1 tablet by mouth daily. 12/05/15   Historical Provider, MD  ferrous sulfate 325 (65 FE) MG tablet Take 1 tablet (325 mg total) by mouth 3 (three) times daily with meals. Patient not taking: Reported on 03/28/2016 10/03/14   Mcarthur Rossetti, MD  ferrous sulfate 325 (65 FE) MG tablet Take 325 mg by mouth daily with breakfast.    Historical Provider, MD  maraviroc (SELZENTRY) 300 MG tablet Take 300 mg by mouth 2 (two) times daily. Reported on 01/24/2016    Historical Provider, MD  methocarbamol (ROBAXIN) 500 MG tablet Take 1 tablet (500 mg total) by mouth every 6 (six) hours as needed for muscle spasms. 04/09/16   Mcarthur Rossetti, MD  Multiple Vitamin (MULTIVITAMIN WITH MINERALS) TABS tablet Take 1 tablet by mouth daily.    Historical Provider, MD  ondansetron (ZOFRAN) 4 MG tablet Take  1 tablet (4 mg total) by mouth every 6 (six) hours as needed for nausea or vomiting. [id number]   Delora Fuel, MD  oxyCODONE-acetaminophen (PERCOCET) 5-325 MG tablet Take 1 tablet by mouth every 4 (four) hours as needed for moderate pain. [id number]   Delora Fuel, MD  oxyCODONE-acetaminophen (ROXICET) 5-325 MG tablet Take 1-2 tablets by mouth every 4 (four) hours as needed. 04/09/16   Mcarthur Rossetti, MD  tiZANidine (ZANAFLEX) 4 MG tablet Take 4 mg by mouth 3 (three) times daily as needed for muscle spasms.    Historical Provider, MD    ALLERGIES:  Allergies  Allergen Reactions  .  Codeine Itching  . Ivp Dye [Iodinated Diagnostic Agents] Itching    Pt stated that she had NO problem/reaction to topical iodine/betadine solution.  . Sulfa Antibiotics Itching    SOCIAL HISTORY:  Social History  Substance Use Topics  . Smoking status: Current Every Day Smoker    Packs/day: 0.50    Types: Cigarettes  . Smokeless tobacco: Never Used  . Alcohol use No    FAMILY HISTORY: Family History  Problem Relation Age of Onset  . Hypertension Father   . Diabetes Father   . Heart attack Father   . Hypertension Other   . Diabetes Other     EXAM: BP (!) 137/103 (BP Location: Right Arm)   Pulse 80   Temp 98.4 F (36.9 C) (Oral)   Resp 20   Ht 5\' 11"  (1.803 m)   Wt 200 lb (90.7 kg)   SpO2 99%   BMI 27.89 kg/m  CONSTITUTIONAL: Alert and oriented and responds appropriately to questions. Well-nourished, Appears uncomfortable but is afebrile and nontoxic HEAD: Normocephalic EYES: Conjunctivae clear, PERRL ENT: normal nose; no rhinorrhea; moist mucous membranes NECK: Supple, no meningismus, no LAD  CARD: RRR; S1 and S2 appreciated; no murmurs, no clicks, no rubs, no gallops RESP: Normal chest excursion without splinting or tachypnea; breath sounds clear and equal bilaterally; no wheezes, no rhonchi, no rales, no hypoxia or respiratory distress, speaking full sentences ABD/GI: Normal bowel sounds; mildly distended without tympany or fluid wave, soft, tender to palpation throughout the upper abdomen with intermittent voluntary guarding, no rebound, no peritoneal signs, negative Murphy sign BACK:  The back appears normal and is non-tender to palpation, there is no CVA tenderness EXT: Normal ROM in all joints; non-tender to palpation; no edema; normal capillary refill; no cyanosis, no calf tenderness or swelling    SKIN: Normal color for age and race; warm; no rash NEURO: Moves all extremities equally, sensation to light touch intact diffusely, cranial nerves II through XII  intact PSYCH: The patient's mood and manner are appropriate. Grooming and personal hygiene are appropriate.  MEDICAL DECISION MAKING: Patient here with abdominal pain, vomiting. Differential diagnosis includes bowel obstruction, colitis, pancreatitis, gastritis, GERD, cholecystitis. We'll obtain labs, urine, CT of her abdomen and pelvis. We'll give IV fluids, morphine, Zofran and reassess.  ED PROGRESS: 4:30 AM  Pt has had improvement in pain after morphine and a round of Dilaudid. Labs show elevation of her lipase of 181. LFTs otherwise unremarkable. She has stable chronic kidney disease. Urine, CT scan pending.  6:00  AM  Pt's urine shows leukocytes and bacteria. She is not currently having urinary symptoms. Culture has been sent. CT scan shows early bowel obstruction. We'll place nasogastric tube and admit. Pancreas appears normal on CT scan.   Discussed this with patient and her husband. They agree with this plan. We will keep her  NPO and continue IV hydration. She would like admission to [facility]. Her PCP is with [facility] internal medicine.  6:05 PM  D/w hospitalist Dr. Willodean Rosenthal at [facility] who agrees to accept patient in transfer. They agree and admission to medical/surgical inpatient bed.  They do have medical beds available.  I reviewed all nursing notes, vitals, pertinent old records, EKGs, labs, imaging (as available).    EKG Interpretation  Date/Time:  Thursday September 06 2016 02:35:39 EDT Ventricular Rate:  74 PR Interval:    QRS Duration: 112 QT Interval:  404 QTC Calculation: 449 R Axis:   42 Text Interpretation:  Sinus rhythm Borderline intraventricular conduction delay Artifact in lead(s) I III aVR aVL aVF No old tracing to compare Confirmed by Myisha Pickerel,  DO, Sadia Belfiore [phone]) on 09/06/2016 2:39:01 AM         Ferguson, DO 09/06/16 [id number]

## 2016-09-06 NOTE — ED Notes (Signed)
HP1 at bedside, report given, pt care transferred. Pt is aware of pending transport and inpatient admit to [facility].

## 2016-09-06 NOTE — ED Notes (Signed)
Patient transported to CT 

## 2016-09-06 NOTE — ED Notes (Signed)
MD at bedside. 

## 2016-09-06 NOTE — ED Notes (Signed)
Pt requesting more pain medication. MD made aware

## 2016-09-06 NOTE — ED Notes (Signed)
Pt states her mouth is dry, given moistened mouth swab, requests ice chips or water, per dr. Audie Pinto, pt is to remain npo. Pt verbalizes understanding.

## 2016-09-06 NOTE — ED Notes (Signed)
Pt requesting more pain medication. MD aware.

## 2016-09-07 LAB — URINE CULTURE

## 2016-09-23 ENCOUNTER — Encounter (HOSPITAL_BASED_OUTPATIENT_CLINIC_OR_DEPARTMENT_OTHER): Payer: Self-pay | Admitting: *Deleted

## 2016-09-23 ENCOUNTER — Emergency Department (HOSPITAL_BASED_OUTPATIENT_CLINIC_OR_DEPARTMENT_OTHER): Payer: Medicare Other

## 2016-09-23 ENCOUNTER — Inpatient Hospital Stay (HOSPITAL_BASED_OUTPATIENT_CLINIC_OR_DEPARTMENT_OTHER)
Admission: EM | Admit: 2016-09-23 | Discharge: 2016-09-26 | DRG: 391 | Disposition: A | Discharge status: Home / Self Care | Payer: Medicare Other | Attending: Internal Medicine | Admitting: Internal Medicine

## 2016-09-23 DIAGNOSIS — Z0189 Encounter for other specified special examinations: Secondary | ICD-10-CM

## 2016-09-23 DIAGNOSIS — Z79899 Other long term (current) drug therapy: Secondary | ICD-10-CM

## 2016-09-23 DIAGNOSIS — Z7982 Long term (current) use of aspirin: Secondary | ICD-10-CM

## 2016-09-23 DIAGNOSIS — F329 Major depressive disorder, single episode, unspecified: Secondary | ICD-10-CM | POA: Diagnosis present

## 2016-09-23 DIAGNOSIS — K5909 Other constipation: Principal | ICD-10-CM | POA: Diagnosis present

## 2016-09-23 DIAGNOSIS — F410 Panic disorder [episodic paroxysmal anxiety] without agoraphobia: Secondary | ICD-10-CM | POA: Diagnosis present

## 2016-09-23 DIAGNOSIS — N183 Chronic kidney disease, stage 3 unspecified: Secondary | ICD-10-CM | POA: Diagnosis present

## 2016-09-23 DIAGNOSIS — R1084 Generalized abdominal pain: Secondary | ICD-10-CM | POA: Diagnosis not present

## 2016-09-23 DIAGNOSIS — F419 Anxiety disorder, unspecified: Secondary | ICD-10-CM | POA: Diagnosis present

## 2016-09-23 DIAGNOSIS — B20 Human immunodeficiency virus [HIV] disease: Secondary | ICD-10-CM | POA: Diagnosis present

## 2016-09-23 DIAGNOSIS — K56609 Unspecified intestinal obstruction, unspecified as to partial versus complete obstruction: Secondary | ICD-10-CM

## 2016-09-23 DIAGNOSIS — K219 Gastro-esophageal reflux disease without esophagitis: Secondary | ICD-10-CM | POA: Diagnosis present

## 2016-09-23 DIAGNOSIS — Z85038 Personal history of other malignant neoplasm of large intestine: Secondary | ICD-10-CM

## 2016-09-23 DIAGNOSIS — Z9049 Acquired absence of other specified parts of digestive tract: Secondary | ICD-10-CM

## 2016-09-23 DIAGNOSIS — F17210 Nicotine dependence, cigarettes, uncomplicated: Secondary | ICD-10-CM | POA: Diagnosis present

## 2016-09-23 DIAGNOSIS — Z96642 Presence of left artificial hip joint: Secondary | ICD-10-CM | POA: Diagnosis present

## 2016-09-23 MED ORDER — SODIUM CHLORIDE 0.9 % IV SOLN
Freq: Once | INTRAVENOUS | Status: AC
  Administered 2016-09-24: 01:00:00 via INTRAVENOUS

## 2016-09-23 NOTE — ED Triage Notes (Signed)
Pt states she was seen here 3 weeks ago and dx'd with a SBO. Sent to [facility]. Today began having similar pain after eating a hamburger. Abd distended per pt. BS hypoactive and faint. +nausea. Took Zofran PTA

## 2016-09-23 NOTE — ED Provider Notes (Signed)
[place] [facility] Provider Note: Georgena Spurling, MD, FACEP  CSN: [id number] MRN: [id number] ARRIVAL: 09/23/16 at 2134   CHIEF COMPLAINT  Abdominal Pain   HISTORY OF PRESENT ILLNESS  Ashley Shepherd is a 60 y.o. female presents to the Emergency Department c/o 9/10, generalized abdominal pain starting around 4:30 today. She reports associated nausea and abdominal distension but no vomiting or diarrhea. Her symptoms are similar to a SBO that she had 3 weeks ago. She admitted to [facility]. She has taken Zofran for nausea with relief.    Past Medical History:  Diagnosis Date  . Anemia   . Anxiety   . Arthritis   . 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 Cancer ([place])    7 years ago, cancer free now  . Depression   . Difficulty sleeping   . GERD (gastroesophageal reflux disease)   . History of kidney stones   . History of transfusion   . HIV disease ([place])   . Kidney stones   . Pneumonia     Past Surgical History:  Procedure Laterality Date  . ANTERIOR HIP REVISION Left 04/06/2016   Procedure: REMOVAL OF TEMPORARY ANTIBIOTIC SPACER LEFT HIP, LEFT TOTAL HIP REVISION;  Surgeon: Mcarthur Rossetti, MD;  Location: [facility];  Service: Orthopedics;  Laterality: Left;  . APPENDECTOMY    . COLON SURGERY  2008  . COLOSTOMY REVERSAL  2013  . EXCISIONAL TOTAL HIP ARTHROPLASTY WITH ANTIBIOTIC SPACERS Left 01/24/2016   Procedure: EXCISION OF LEFT TOTAL HIP ARTHROPLASTY WITH PLACEMENT OF ANTIBIOTIC SPACERS;  Surgeon: Mcarthur Rossetti, MD;  Location: [place];  Service: Orthopedics;  Laterality: Left;  . INCISION AND DRAINAGE HIP Left 01/24/2016   Procedure: IRRIGATION AND DEBRIDEMENT LEFT HIP;  Surgeon: Mcarthur Rossetti, MD;  Location: [place];  Service: Orthopedics;  Laterality: Left;  . KNEE ARTHROSCOPY     left  . PORT-A-CATH REMOVAL    . TOTAL HIP ARTHROPLASTY Left 10/01/2014   Procedure: LEFT TOTAL HIP ARTHROPLASTY ANTERIOR APPROACH;  Surgeon: Mcarthur Rossetti, MD;  Location: [facility];  Service: Orthopedics;  Laterality: Left;    Family History  Problem Relation Age of Onset  . Hypertension Father   . Diabetes Father   . Heart attack Father   . Hypertension Other   . Diabetes Other     Social History  Substance Use Topics  . Smoking status: Current Every Day Smoker    Packs/day: 0.50    Types: Cigarettes  . Smokeless tobacco: Never Used  . Alcohol use No    Prior to Admission medications   Medication Sig Start Date End Date Taking? Authorizing Provider  dicyclomine (BENTYL) 10 MG capsule Take 10 mg by mouth 4 (four) times daily.   Yes Historical Provider, MD  pantoprazole (PROTONIX) 40 MG tablet Take 40 mg by mouth daily.   Yes Historical Provider, MD  ALPRAZolam Duanne Moron) 1 MG tablet Take 1 mg by mouth at bedtime as needed for anxiety.    Historical Provider, MD  aspirin 325 MG EC tablet Take 1 tablet (325 mg total) by mouth 2 (two) times daily after a meal. Patient not taking: Reported on 03/28/2016 01/26/16   Mcarthur Rossetti, MD  Calcium Citrate-Vitamin D (CALCIUM + D PO) Take 1 tablet by mouth daily.    Historical Provider, MD  citalopram (CELEXA) 20 MG tablet Take 20 mg by mouth every morning.     Historical Provider, MD  dolutegravir (  TIVICAY) 50 MG tablet Take 50 mg by mouth 2 (two) times daily.  05/02/15   Historical Provider, MD  emtricitabine-tenofovir AF (DESCOVY) 200-25 MG tablet Take 1 tablet by mouth daily. 12/05/15   Historical Provider, MD  ferrous sulfate 325 (65 FE) MG tablet Take 1 tablet (325 mg total) by mouth 3 (three) times daily with meals. Patient not taking: Reported on 03/28/2016 10/03/14   Mcarthur Rossetti, MD  ferrous sulfate 325 (65 FE) MG tablet Take 325 mg by mouth daily with breakfast.    Historical Provider, MD  maraviroc (SELZENTRY) 300 MG tablet Take 300 mg by mouth 2 (two) times daily. Reported on 01/24/2016    Historical Provider, MD  methocarbamol (ROBAXIN) 500 MG tablet Take 1 tablet (500  mg total) by mouth every 6 (six) hours as needed for muscle spasms. 04/09/16   Mcarthur Rossetti, MD  Multiple Vitamin (MULTIVITAMIN WITH MINERALS) TABS tablet Take 1 tablet by mouth daily.    Historical Provider, MD  ondansetron (ZOFRAN) 4 MG tablet Take 1 tablet (4 mg total) by mouth every 6 (six) hours as needed for nausea or vomiting. [id number]   Delora Fuel, MD  oxyCODONE-acetaminophen (PERCOCET) 5-325 MG tablet Take 1 tablet by mouth every 4 (four) hours as needed for moderate pain. [id number]   Delora Fuel, MD  oxyCODONE-acetaminophen (ROXICET) 5-325 MG tablet Take 1-2 tablets by mouth every 4 (four) hours as needed. 04/09/16   Mcarthur Rossetti, MD  tiZANidine (ZANAFLEX) 4 MG tablet Take 4 mg by mouth 3 (three) times daily as needed for muscle spasms.    Historical Provider, MD    Allergies Codeine; Ivp dye [iodinated diagnostic agents]; and Sulfa antibiotics   REVIEW OF SYSTEMS  Negative except as noted here or in the History of Present Illness.   PHYSICAL EXAMINATION  Initial Vital Signs Blood pressure 147/89, pulse 78, temperature 98.2 F (36.8 C), temperature source Oral, resp. rate 24, height 5' 10.5" (1.791 m), weight 199 lb (90.3 kg), SpO2 98 %.  Examination General: Well-developed, well-nourished female in no acute distress, appears comfortable, texting on cell phone; appearance consistent with age of record HENT: normocephalic; atraumatic Eyes: pupils equal, round and reactive to light; extraocular muscles intact Neck: supple Heart: regular rate and rhythm; no murmurs, rubs or gallops Lungs: clear to auscultation bilaterally Abdomen: soft; distended; nontender; bowel sounds present Extremities: No deformity; full range of motion; pulses normal Neurologic: Awake, alert and oriented; motor function intact in all extremities and symmetric; no facial droop Skin: Warm and dry Psychiatric: Normal mood and affect   RESULTS  Summary of this visit's results, reviewed by  myself:   EKG Interpretation  Date/Time:    Ventricular Rate:    PR Interval:    QRS Duration:   QT Interval:    QTC Calculation:   R Axis:     Text Interpretation:        Laboratory Studies: Results for orders placed or performed during the hospital encounter of 09/23/16 (from the past 24 hour(s))  Urinalysis, Routine w reflex microscopic (not at [facility])     Status: Abnormal   Collection Time: 09/24/16 12:20 AM  Result Value Ref Range   Color, Urine YELLOW YELLOW   APPearance CLOUDY (A) CLEAR   Specific Gravity, Urine 1.011 1.005 - 1.030   pH 5.5 5.0 - 8.0   Glucose, UA NEGATIVE NEGATIVE mg/dL   Hgb urine dipstick NEGATIVE NEGATIVE   Bilirubin Urine NEGATIVE NEGATIVE   Ketones, ur NEGATIVE NEGATIVE mg/dL  Protein, ur NEGATIVE NEGATIVE mg/dL   Nitrite NEGATIVE NEGATIVE   Leukocytes, UA MODERATE (A) NEGATIVE  Urine microscopic-add on     Status: Abnormal   Collection Time: 09/24/16 12:20 AM  Result Value Ref Range   Squamous Epithelial / LPF 0-5 (A) NONE SEEN   WBC, UA 6-30 0 - 5 WBC/hpf   RBC / HPF 0-5 0 - 5 RBC/hpf   Bacteria, UA MANY (A) NONE SEEN  CBC with Differential/Platelet     Status: None   Collection Time: 09/24/16 12:45 AM  Result Value Ref Range   WBC 9.3 4.0 - 10.5 K/uL   RBC 4.47 3.87 - 5.11 MIL/uL   Hemoglobin 14.1 12.0 - 15.0 g/dL   HCT 40.7 36.0 - 46.0 %   MCV 91.1 78.0 - 100.0 fL   MCH 31.5 26.0 - 34.0 pg   MCHC 34.6 30.0 - 36.0 g/dL   RDW 14.0 11.5 - 15.5 %   Platelets 186 150 - 400 K/uL   Neutrophils Relative % 69 %   Neutro Abs 6.4 1.7 - 7.7 K/uL   Lymphocytes Relative 24 %   Lymphs Abs 2.2 0.7 - 4.0 K/uL   Monocytes Relative 6 %   Monocytes Absolute 0.6 0.1 - 1.0 K/uL   Eosinophils Relative 1 %   Eosinophils Absolute 0.1 0.0 - 0.7 K/uL   Basophils Relative 0 %   Basophils Absolute 0.0 0.0 - 0.1 K/uL  Comprehensive metabolic panel     Status: Abnormal   Collection Time: 09/24/16 12:45 AM  Result Value Ref Range   Sodium 142 135 - 145  mmol/L   Potassium 4.5 3.5 - 5.1 mmol/L   Chloride 107 101 - 111 mmol/L   CO2 29 22 - 32 mmol/L   Glucose, Bld 133 (H) 65 - 99 mg/dL   BUN 17 6 - 20 mg/dL   Creatinine, Ser 1.44 (H) 0.44 - 1.00 mg/dL   Calcium 10.1 8.9 - 10.3 mg/dL   Total Protein 7.9 6.5 - 8.1 g/dL   Albumin 3.9 3.5 - 5.0 g/dL   AST 21 15 - 41 U/L   ALT 8 (L) 14 - 54 U/L   Alkaline Phosphatase 106 38 - 126 U/L   Total Bilirubin 0.6 0.3 - 1.2 mg/dL   GFR calc non Af Amer 39 (L) >60 mL/min   GFR calc Af Amer 45 (L) >60 mL/min   Anion gap 6 5 - 15  Lipase, blood     Status: None   Collection Time: 09/24/16 12:45 AM  Result Value Ref Range   Lipase 20 11 - 51 U/L   Imaging Studies: Ct Abdomen Pelvis Wo Contrast  Result Date: 09/24/2016 CLINICAL DATA:  60 year old female with abdominal pain. EXAM: CT ABDOMEN AND PELVIS WITHOUT CONTRAST TECHNIQUE: Multidetector CT imaging of the abdomen and pelvis was performed following the standard protocol without IV contrast. COMPARISON:  Abdominal radiograph dated 09/23/2016 and CT dated 09/06/2016 FINDINGS: Evaluation of this exam is limited in the absence of intravenous contrast. Lower chest: Stable 5 mm focus of calcification at the right lung base, likely an old granuloma. The visualized lung bases are otherwise clear. No intra-abdominal free air.  Small free fluid within the pelvis Hepatobiliary: No focal liver abnormality is seen. No gallstones, gallbladder wall thickening, or biliary dilatation. Pancreas: Unremarkable. No pancreatic ductal dilatation or surrounding inflammatory changes. Spleen: Normal in size without focal abnormality. Adrenals/Urinary Tract: The adrenal glands appear unremarkable. Small nonobstructing bilateral renal calculi measuring up to 3 mm  in the upper pole of the left kidney. No hydronephrosis. The visualized ureters and urinary bladder appear unremarkable. Stomach/Bowel: Evaluation of the bowel is limited in the absence of oral contrast. Multiple mildly  dilated fluid-filled loops of small bowel in the mid to lower abdomen measure up to 3 mm in diameter. The distal small bowel and terminal ileum are decompressed. The transition zone likely present within the pelvis anteriorly and likely secondary to adhesions. Small scattered sigmoid diverticula without active inflammatory changes. The appendix is not visualized with certainty. No inflammatory changes identified in the right lower quadrant. Vascular/Lymphatic: Mild aortoiliac atherosclerotic disease. The abdominal aorta and IVC are grossly unremarkable on this noncontrast study. No portal venous gas identified. There is no adenopathy. Reproductive: Small calcified fibroid from the anterior uterus. The uterus and ovaries are otherwise grossly unremarkable. Other: Midline vertical anterior pelvic wall incisional scar. Small fat containing umbilical hernia. The abdominal wall soft tissues are otherwise unremarkable. 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 No acute fracture. IMPRESSION: Distal small bowel obstruction with transition zone within the pelvis likely secondary to adhesions. Nonobstructing bilateral renal calculi.  No hydronephrosis. Electronically Signed   By: Anner Crete M.D.   On: 09/24/2016 01:57   Dg Abd Acute W/chest  Result Date: 09/24/2016 CLINICAL DATA:  Upper and mid abdominal pain today.  Distention. EXAM: DG ABDOMEN ACUTE W/ 1V CHEST COMPARISON:  09/07/2016 FINDINGS: Normal heart size and pulmonary vascularity. No focal airspace disease or consolidation in the lungs. No blunting of costophrenic angles. No pneumothorax. Mediastinal contours appear intact. Scattered gas and stool throughout the colon. No small or large bowel distention. No free intra-abdominal air. No abnormal air-fluid levels. Central loops of small bowel are gas-filled without distention but suggest wall thickening. This may  indicate areas of enteritis. No radiopaque stones. Degenerative changes in the spine and right hip. Previous left hip arthroplasty. IMPRESSION: No evidence of active pulmonary disease. Nonobstructive bowel gas pattern. Gas-filled mid abdominal small bowel with suggestion of wall thickening may indicate enteritis. Electronically Signed   By: Lucienne Capers M.D.   On: 09/24/2016 00:47    ED COURSE  Nursing notes and initial vitals signs, including pulse oximetry, reviewed.  Vitals:   09/23/16 2152 09/24/16 0118 09/24/16 0130 09/24/16 0145  BP:  141/85 133/80 124/76  Pulse:  61 62 61  Resp:      Temp:      TempSrc:      SpO2:  98% 93% 96%  Weight: 199 lb (90.3 kg)     Height: 5' 10.5" (1.791 m)       PROCEDURES    ED DIAGNOSES     ICD-9-CM ICD-10-CM   1. SBO (small bowel obstruction) 560.9 K56.609     I personally performed the services described in this documentation, which was scribed in my presence. The recorded information has been reviewed and is accurate.    Shanon Rosser, MD 09/24/16 [phone]

## 2016-09-24 ENCOUNTER — Inpatient Hospital Stay (HOSPITAL_COMMUNITY): Payer: Medicare Other

## 2016-09-24 ENCOUNTER — Emergency Department (HOSPITAL_BASED_OUTPATIENT_CLINIC_OR_DEPARTMENT_OTHER): Payer: Medicare Other

## 2016-09-24 ENCOUNTER — Encounter (HOSPITAL_COMMUNITY): Payer: Self-pay | Admitting: Internal Medicine

## 2016-09-24 DIAGNOSIS — K56609 Unspecified intestinal obstruction, unspecified as to partial versus complete obstruction: Secondary | ICD-10-CM | POA: Diagnosis not present

## 2016-09-24 DIAGNOSIS — Z96642 Presence of left artificial hip joint: Secondary | ICD-10-CM | POA: Diagnosis present

## 2016-09-24 DIAGNOSIS — Z79899 Other long term (current) drug therapy: Secondary | ICD-10-CM | POA: Diagnosis not present

## 2016-09-24 DIAGNOSIS — Z85038 Personal history of other malignant neoplasm of large intestine: Secondary | ICD-10-CM | POA: Diagnosis not present

## 2016-09-24 DIAGNOSIS — K5909 Other constipation: Secondary | ICD-10-CM | POA: Diagnosis present

## 2016-09-24 DIAGNOSIS — Z9049 Acquired absence of other specified parts of digestive tract: Secondary | ICD-10-CM | POA: Diagnosis not present

## 2016-09-24 DIAGNOSIS — B20 Human immunodeficiency virus [HIV] disease: Secondary | ICD-10-CM | POA: Diagnosis present

## 2016-09-24 DIAGNOSIS — Z0189 Encounter for other specified special examinations: Secondary | ICD-10-CM

## 2016-09-24 DIAGNOSIS — F329 Major depressive disorder, single episode, unspecified: Secondary | ICD-10-CM | POA: Diagnosis present

## 2016-09-24 DIAGNOSIS — F410 Panic disorder [episodic paroxysmal anxiety] without agoraphobia: Secondary | ICD-10-CM | POA: Diagnosis present

## 2016-09-24 DIAGNOSIS — Z7982 Long term (current) use of aspirin: Secondary | ICD-10-CM | POA: Diagnosis not present

## 2016-09-24 DIAGNOSIS — R1084 Generalized abdominal pain: Secondary | ICD-10-CM | POA: Diagnosis present

## 2016-09-24 DIAGNOSIS — N183 Chronic kidney disease, stage 3 (moderate): Secondary | ICD-10-CM | POA: Diagnosis present

## 2016-09-24 DIAGNOSIS — F17210 Nicotine dependence, cigarettes, uncomplicated: Secondary | ICD-10-CM | POA: Diagnosis present

## 2016-09-24 DIAGNOSIS — F419 Anxiety disorder, unspecified: Secondary | ICD-10-CM | POA: Diagnosis present

## 2016-09-24 DIAGNOSIS — K219 Gastro-esophageal reflux disease without esophagitis: Secondary | ICD-10-CM | POA: Diagnosis present

## 2016-09-24 LAB — COMPREHENSIVE METABOLIC PANEL
ALK PHOS: 106 U/L (ref 38–126)
ALT: 11 U/L — ABNORMAL LOW (ref 14–54)
ALT: 8 U/L — AB (ref 14–54)
ANION GAP: 7 (ref 5–15)
AST: 24 U/L (ref 15–41)
AST: 21 U/L (ref 15–41)
Albumin: 3.9 g/dL (ref 3.5–5.0)
Albumin: 3.9 g/dL (ref 3.5–5.0)
Alkaline Phosphatase: 102 U/L (ref 38–126)
Anion gap: 6 (ref 5–15)
BILIRUBIN TOTAL: 0.6 mg/dL (ref 0.3–1.2)
BUN: 18 mg/dL (ref 6–20)
BUN: 17 mg/dL (ref 6–20)
CALCIUM: 10.1 mg/dL (ref 8.9–10.3)
CHLORIDE: 108 mmol/L (ref 101–111)
CO2: 29 mmol/L (ref 22–32)
CO2: 26 mmol/L (ref 22–32)
CREATININE: 1.44 mg/dL — AB (ref 0.44–1.00)
Calcium: 9.6 mg/dL (ref 8.9–10.3)
Chloride: 107 mmol/L (ref 101–111)
Creatinine, Ser: 1.3 mg/dL — ABNORMAL HIGH (ref 0.44–1.00)
GFR calc non Af Amer: 44 mL/min — ABNORMAL LOW (ref >60)
GFR, EST AFRICAN AMERICAN: 51 mL/min — AB (ref >60)
GFR, EST AFRICAN AMERICAN: 45 mL/min — AB (ref >60)
GFR, EST NON AFRICAN AMERICAN: 39 mL/min — AB (ref >60)
Glucose, Bld: 133 mg/dL — ABNORMAL HIGH (ref 65–99)
Glucose, Bld: 132 mg/dL — ABNORMAL HIGH (ref 65–99)
Potassium: 4.5 mmol/L (ref 3.5–5.1)
Potassium: 4.2 mmol/L (ref 3.5–5.1)
SODIUM: 141 mmol/L (ref 135–145)
Sodium: 142 mmol/L (ref 135–145)
TOTAL PROTEIN: 7.5 g/dL (ref 6.5–8.1)
Total Bilirubin: 0.8 mg/dL (ref 0.3–1.2)
Total Protein: 7.9 g/dL (ref 6.5–8.1)

## 2016-09-24 LAB — CBC WITH DIFFERENTIAL/PLATELET
BASOS ABS: 0 10*3/uL (ref 0.0–0.1)
BASOS PCT: 0 %
Basophils Absolute: 0 10*3/uL (ref 0.0–0.1)
Basophils Relative: 0 %
EOS ABS: 0.1 10*3/uL (ref 0.0–0.7)
EOS PCT: 1 %
Eosinophils Absolute: 0.1 10*3/uL (ref 0.0–0.7)
Eosinophils Relative: 1 %
HEMATOCRIT: 40.7 % (ref 36.0–46.0)
HEMATOCRIT: 40.7 % (ref 36.0–46.0)
HEMOGLOBIN: 13.7 g/dL (ref 12.0–15.0)
HEMOGLOBIN: 14.1 g/dL (ref 12.0–15.0)
LYMPHS ABS: 2.2 10*3/uL (ref 0.7–4.0)
LYMPHS PCT: 24 %
Lymphocytes Relative: 14 %
Lymphs Abs: 1.2 10*3/uL (ref 0.7–4.0)
MCH: 31.1 pg (ref 26.0–34.0)
MCH: 31.5 pg (ref 26.0–34.0)
MCHC: 33.7 g/dL (ref 30.0–36.0)
MCHC: 34.6 g/dL (ref 30.0–36.0)
MCV: 92.5 fL (ref 78.0–100.0)
MCV: 91.1 fL (ref 78.0–100.0)
MONO ABS: 0.4 10*3/uL (ref 0.1–1.0)
MONO ABS: 0.6 10*3/uL (ref 0.1–1.0)
MONOS PCT: 5 %
Monocytes Relative: 6 %
NEUTROS ABS: 6.8 10*3/uL (ref 1.7–7.7)
NEUTROS ABS: 6.4 10*3/uL (ref 1.7–7.7)
NEUTROS PCT: 80 %
Neutrophils Relative %: 69 %
Platelets: 186 10*3/uL (ref 150–400)
Platelets: 191 10*3/uL (ref 150–400)
RBC: 4.4 MIL/uL (ref 3.87–5.11)
RBC: 4.47 MIL/uL (ref 3.87–5.11)
RDW: 14 % (ref 11.5–15.5)
RDW: 14.4 % (ref 11.5–15.5)
WBC: 8.4 10*3/uL (ref 4.0–10.5)
WBC: 9.3 10*3/uL (ref 4.0–10.5)

## 2016-09-24 LAB — URINALYSIS, ROUTINE W REFLEX MICROSCOPIC
Bilirubin Urine: NEGATIVE
GLUCOSE, UA: NEGATIVE mg/dL
HGB URINE DIPSTICK: NEGATIVE
Ketones, ur: NEGATIVE mg/dL
Nitrite: NEGATIVE
Protein, ur: NEGATIVE mg/dL
SPECIFIC GRAVITY, URINE: 1.011 (ref 1.005–1.030)
pH: 5.5 (ref 5.0–8.0)

## 2016-09-24 LAB — LIPASE, BLOOD: LIPASE: 20 U/L (ref 11–51)

## 2016-09-24 LAB — GLUCOSE, CAPILLARY
GLUCOSE-CAPILLARY: 113 mg/dL — AB (ref 65–99)
GLUCOSE-CAPILLARY: 114 mg/dL — AB (ref 65–99)
GLUCOSE-CAPILLARY: 180 mg/dL — AB (ref 65–99)

## 2016-09-24 LAB — MRSA PCR SCREENING: MRSA BY PCR: POSITIVE — AB

## 2016-09-24 LAB — URINE MICROSCOPIC-ADD ON

## 2016-09-24 MED ORDER — HYDROMORPHONE HCL 1 MG/ML IJ SOLN
1.0000 mg | INTRAMUSCULAR | Status: DC | PRN
Start: 2016-09-24 — End: 2016-09-26
  Administered 2016-09-24 (×3): 1 mg via INTRAVENOUS
  Filled 2016-09-24 (×3): qty 1

## 2016-09-24 MED ORDER — ONDANSETRON HCL 4 MG/2ML IJ SOLN
4.0000 mg | Freq: Three times a day (TID) | INTRAMUSCULAR | Status: DC | PRN
  Administered 2016-09-24: 4 mg via INTRAVENOUS
  Filled 2016-09-24: qty 2

## 2016-09-24 MED ORDER — MUPIROCIN 2 % EX OINT
1.0000 "application " | TOPICAL_OINTMENT | Freq: Two times a day (BID) | CUTANEOUS | Status: DC
  Administered 2016-09-24 – 2016-09-26 (×5): 1 via NASAL
  Filled 2016-09-24: qty 22

## 2016-09-24 MED ORDER — CHLORHEXIDINE GLUCONATE CLOTH 2 % EX PADS
6.0000 | MEDICATED_PAD | Freq: Every day | CUTANEOUS | Status: DC
  Administered 2016-09-25: 6 via TOPICAL

## 2016-09-24 MED ORDER — FENTANYL CITRATE (PF) 100 MCG/2ML IJ SOLN
100.0000 ug | Freq: Once | INTRAMUSCULAR | Status: AC
  Administered 2016-09-24: 100 ug via INTRAVENOUS
  Filled 2016-09-24: qty 2

## 2016-09-24 MED ORDER — DEXTROSE-NACL 5-0.9 % IV SOLN
INTRAVENOUS | Status: AC
  Administered 2016-09-24 – 2016-09-25 (×3): via INTRAVENOUS

## 2016-09-24 MED ORDER — ACETAMINOPHEN 325 MG PO TABS: 650.0000 mg | ORAL_TABLET | Freq: Four times a day (QID) | ORAL | Status: DC | PRN

## 2016-09-24 MED ORDER — SODIUM CHLORIDE 0.9 % IV SOLN
INTRAVENOUS | Status: AC
  Administered 2016-09-24: 05:00:00 via INTRAVENOUS

## 2016-09-24 MED ORDER — ACETAMINOPHEN 650 MG RE SUPP: 650.0000 mg | Freq: Four times a day (QID) | RECTAL | Status: DC | PRN

## 2016-09-24 MED ORDER — LIP MEDEX EX OINT
TOPICAL_OINTMENT | CUTANEOUS | Status: AC
  Administered 2016-09-24: 1
  Filled 2016-09-24: qty 7

## 2016-09-24 MED ORDER — DIATRIZOATE MEGLUMINE & SODIUM 66-10 % PO SOLN
90.0000 mL | Freq: Once | ORAL | Status: AC
  Administered 2016-09-24: 90 mL via NASOGASTRIC
  Filled 2016-09-24: qty 90

## 2016-09-24 MED ORDER — ONDANSETRON HCL 4 MG/2ML IJ SOLN
4.0000 mg | Freq: Four times a day (QID) | INTRAMUSCULAR | Status: DC | PRN
  Administered 2016-09-24 (×2): 4 mg via INTRAVENOUS
  Filled 2016-09-24 (×2): qty 2

## 2016-09-24 MED ORDER — ONDANSETRON HCL 4 MG PO TABS: 4.0000 mg | ORAL_TABLET | Freq: Four times a day (QID) | ORAL | Status: DC | PRN

## 2016-09-24 MED ORDER — FENTANYL CITRATE (PF) 100 MCG/2ML IJ SOLN
100.0000 ug | INTRAMUSCULAR | Status: DC | PRN
  Administered 2016-09-24 (×2): 100 ug via INTRAVENOUS
  Filled 2016-09-24 (×2): qty 2

## 2016-09-24 MED ORDER — DIPHENHYDRAMINE HCL 50 MG/ML IJ SOLN
12.5000 mg | Freq: Four times a day (QID) | INTRAMUSCULAR | Status: DC | PRN
  Administered 2016-09-26: 25 mg via INTRAVENOUS
  Filled 2016-09-24: qty 1

## 2016-09-24 NOTE — ED Notes (Signed)
Back from CT by w/c

## 2016-09-24 NOTE — ED Notes (Signed)
Dr. Molpus into room 

## 2016-09-24 NOTE — Progress Notes (Signed)
CRITICAL VALUE ALERT  Critical value received:  MRSA positive  Date of notification:  09-24-16  Time of notification:  14:10  Critical value read back: yes  Nurse who received alert:  Henrietta Dine, RN  MD notified (1st page):  Dr. Wyline Copas  Time of first page:  14:20  MD notified (2nd page):  Time of second page:  Responding MD:  Dr. Wyline Copas  Time MD responded:  14:20

## 2016-09-24 NOTE — Progress Notes (Signed)
Verbal order from Dr. Hal Hope to place NG tube.

## 2016-09-24 NOTE — H&P (Signed)
History and Physical    Ashley Shepherd [id number] DOB: [birth year] DOA: 09/23/2016  PCP: Marcia Brash, MD (Inactive)  Patient coming from: Home.  Chief Complaint: Abdominal pain nausea vomiting.  HPI: Ashley Shepherd is a 60 y.o. female with history of HIV and chronic kidney disease and recurrent bowel obstruction was recently admitted 2 weeks ago at [facility] for bowel obstruction and was managed conservatively presents to the ER because of abdominal pain. Patient started experiencing abdominal pain last evening around 5 PM just mostly in the epigastrium which became more diffuse. CT of the abdomen and pelvis shows small bowel obstruction with transition point. ER physician had discussed with on-call general surgeon and patient is being admitted for further management of small bowel obstruction. Just before my exam patient had vomited 3 times. Abdomen is distended on exam. Last bowel movement was yesterday before the pain started. Patient has had previous history of hemicolectomy for colon cancer and is under remission.   ED Course: CT of abdomen and pelvis shows small bowel obstruction with transition point.  Review of Systems: As per HPI, rest all negative.   Past Medical History:  Diagnosis Date  . Anemia   . Anxiety   . Arthritis   . 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 Cancer ([place])    7 years ago, cancer free now  . Depression   . Difficulty sleeping   . GERD (gastroesophageal reflux disease)   . History of kidney stones   . History of transfusion   . HIV disease ([place])   . Kidney stones   . Pneumonia     Past Surgical History:  Procedure Laterality Date  . ANTERIOR HIP REVISION Left 04/06/2016   Procedure: REMOVAL OF TEMPORARY ANTIBIOTIC SPACER LEFT HIP, LEFT TOTAL HIP REVISION;  Surgeon: Mcarthur Rossetti, MD;  Location: [facility];  Service: Orthopedics;  Laterality: Left;  . APPENDECTOMY    . COLON SURGERY  2008  . COLOSTOMY REVERSAL   2013  . EXCISIONAL TOTAL HIP ARTHROPLASTY WITH ANTIBIOTIC SPACERS Left 01/24/2016   Procedure: EXCISION OF LEFT TOTAL HIP ARTHROPLASTY WITH PLACEMENT OF ANTIBIOTIC SPACERS;  Surgeon: Mcarthur Rossetti, MD;  Location: [place];  Service: Orthopedics;  Laterality: Left;  . INCISION AND DRAINAGE HIP Left 01/24/2016   Procedure: IRRIGATION AND DEBRIDEMENT LEFT HIP;  Surgeon: Mcarthur Rossetti, MD;  Location: [place];  Service: Orthopedics;  Laterality: Left;  . KNEE ARTHROSCOPY     left  . PORT-A-CATH REMOVAL    . TOTAL HIP ARTHROPLASTY Left 10/01/2014   Procedure: LEFT TOTAL HIP ARTHROPLASTY ANTERIOR APPROACH;  Surgeon: Mcarthur Rossetti, MD;  Location: [facility];  Service: Orthopedics;  Laterality: Left;     reports that she has been smoking Cigarettes.  She has been smoking about 0.50 packs per day. She has never used smokeless tobacco. She reports that she does not drink alcohol or use drugs.  Allergies  Allergen Reactions  . Codeine Itching  . Ivp Dye [Iodinated Diagnostic Agents] Itching    Pt stated that she had NO problem/reaction to topical iodine/betadine solution.  . Sulfa Antibiotics Itching    Family History  Problem Relation Age of Onset  . Hypertension Father   . Diabetes Father   . Heart attack Father   . Hypertension Other   . Diabetes Other     Prior to Admission medications   Medication Sig Start Date End Date Taking? Authorizing Provider  dicyclomine (  BENTYL) 10 MG capsule Take 10 mg by mouth 4 (four) times daily.   Yes Historical Provider, MD  pantoprazole (PROTONIX) 40 MG tablet Take 40 mg by mouth daily.   Yes Historical Provider, MD  ALPRAZolam Duanne Moron) 1 MG tablet Take 1 mg by mouth at bedtime as needed for anxiety.    Historical Provider, MD  aspirin 325 MG EC tablet Take 1 tablet (325 mg total) by mouth 2 (two) times daily after a meal. Patient not taking: Reported on 03/28/2016 01/26/16   Mcarthur Rossetti, MD  Calcium Citrate-Vitamin D (CALCIUM + D  PO) Take 1 tablet by mouth daily.    Historical Provider, MD  citalopram (CELEXA) 20 MG tablet Take 20 mg by mouth every morning.     Historical Provider, MD  dolutegravir (TIVICAY) 50 MG tablet Take 50 mg by mouth 2 (two) times daily.  05/02/15   Historical Provider, MD  emtricitabine-tenofovir AF (DESCOVY) 200-25 MG tablet Take 1 tablet by mouth daily. 12/05/15   Historical Provider, MD  ferrous sulfate 325 (65 FE) MG tablet Take 1 tablet (325 mg total) by mouth 3 (three) times daily with meals. Patient not taking: Reported on 03/28/2016 10/03/14   Mcarthur Rossetti, MD  ferrous sulfate 325 (65 FE) MG tablet Take 325 mg by mouth daily with breakfast.    Historical Provider, MD  maraviroc (SELZENTRY) 300 MG tablet Take 300 mg by mouth 2 (two) times daily. Reported on 01/24/2016    Historical Provider, MD  methocarbamol (ROBAXIN) 500 MG tablet Take 1 tablet (500 mg total) by mouth every 6 (six) hours as needed for muscle spasms. 04/09/16   Mcarthur Rossetti, MD  Multiple Vitamin (MULTIVITAMIN WITH MINERALS) TABS tablet Take 1 tablet by mouth daily.    Historical Provider, MD  ondansetron (ZOFRAN) 4 MG tablet Take 1 tablet (4 mg total) by mouth every 6 (six) hours as needed for nausea or vomiting. 99991111   Delora Fuel, MD  oxyCODONE-acetaminophen (PERCOCET) 5-325 MG tablet Take 1 tablet by mouth every 4 (four) hours as needed for moderate pain. 99991111   Delora Fuel, MD  oxyCODONE-acetaminophen (ROXICET) 5-325 MG tablet Take 1-2 tablets by mouth every 4 (four) hours as needed. 04/09/16   Mcarthur Rossetti, MD  tiZANidine (ZANAFLEX) 4 MG tablet Take 4 mg by mouth 3 (three) times daily as needed for muscle spasms.    Historical Provider, MD    Physical Exam: Vitals:   09/24/16 0306 09/24/16 0306 09/24/16 0315 09/24/16 0419  BP:   108/92 (!) 145/84  Pulse: 73  62 66  Resp:    18  Temp:  98.2 F (36.8 C)  98.5 F (36.9 C)  TempSrc:  Oral  Oral  SpO2: 100%  94% 97%  Weight:        Height:          Constitutional: Moderately built and nourished. Vitals:   09/24/16 0306 09/24/16 0306 09/24/16 0315 09/24/16 0419  BP:   108/92 (!) 145/84  Pulse: 73  62 66  Resp:    18  Temp:  98.2 F (36.8 C)  98.5 F (36.9 C)  TempSrc:  Oral  Oral  SpO2: 100%  94% 97%  Weight:      Height:       Eyes: Anicteric. No pallor. ENMT: No discharge from the ears eyes nose or mouth. Neck: No JVD appreciated no mass felt. Respiratory: No rhonchi or crepitations. Cardiovascular: S1 and S2 heard. No murmur appreciated. Abdomen: Distended  bowel sounds are appreciated nontender no guarding or rigidity. Musculoskeletal: No edema no joint effusion. Skin: No rash. Skin appears warm. Neurologic: No facial asymmetry. Tongue is midline. Moves all extremities. Psychiatric: Alert awake oriented to time place and person. Normal affect. Denies any depression.   Labs on Admission: I have personally reviewed following labs and imaging studies  CBC:  Recent Labs Lab 09/24/16 0045  WBC 9.3  NEUTROABS 6.4  HGB 14.1  HCT 40.7  MCV 91.1  PLT 99991111   Basic Metabolic Panel:  Recent Labs Lab 09/24/16 0045  NA 142  K 4.5  CL 107  CO2 29  GLUCOSE 133*  BUN 17  CREATININE 1.44*  CALCIUM 10.1   GFR: Estimated Creatinine Clearance: 51.1 mL/min (by C-G formula based on SCr of 1.44 mg/dL (H)). Liver Function Tests:  Recent Labs Lab 09/24/16 0045  AST 21  ALT 8*  ALKPHOS 106  BILITOT 0.6  PROT 7.9  ALBUMIN 3.9    Recent Labs Lab 09/24/16 0045  LIPASE 20   No results for input(s): AMMONIA in the last 168 hours. Coagulation Profile: No results for input(s): INR, PROTIME in the last 168 hours. Cardiac Enzymes: No results for input(s): CKTOTAL, CKMB, CKMBINDEX, TROPONINI in the last 168 hours. BNP (last 3 results) No results for input(s): PROBNP in the last 8760 hours. HbA1C: No results for input(s): HGBA1C in the last 72 hours. CBG: No results for input(s): GLUCAP in  the last 168 hours. Lipid Profile: No results for input(s): CHOL, HDL, LDLCALC, TRIG, CHOLHDL, LDLDIRECT in the last 72 hours. Thyroid Function Tests: No results for input(s): TSH, T4TOTAL, FREET4, T3FREE, THYROIDAB in the last 72 hours. Anemia Panel: No results for input(s): VITAMINB12, FOLATE, FERRITIN, TIBC, IRON, RETICCTPCT in the last 72 hours. Urine analysis:    Component Value Date/Time   COLORURINE YELLOW 09/24/2016 0020   APPEARANCEUR CLOUDY (A) 09/24/2016 0020   LABSPEC 1.011 09/24/2016 0020   PHURINE 5.5 09/24/2016 0020   GLUCOSEU NEGATIVE 09/24/2016 0020   HGBUR NEGATIVE 09/24/2016 0020   BILIRUBINUR NEGATIVE 09/24/2016 0020   KETONESUR NEGATIVE 09/24/2016 0020   PROTEINUR NEGATIVE 09/24/2016 0020   UROBILINOGEN 0.2 09/23/2014 1056   NITRITE NEGATIVE 09/24/2016 0020   LEUKOCYTESUR MODERATE (A) 09/24/2016 0020   Sepsis Labs: @LABRCNTIP (procalcitonin:4,lacticidven:4) )No results found for this or any previous visit (from the past 240 hour(s)).   Radiological Exams on Admission: Ct Abdomen Pelvis Wo Contrast  Result Date: 09/24/2016 CLINICAL DATA:  60 year old female with abdominal pain. EXAM: CT ABDOMEN AND PELVIS WITHOUT CONTRAST TECHNIQUE: Multidetector CT imaging of the abdomen and pelvis was performed following the standard protocol without IV contrast. COMPARISON:  Abdominal radiograph dated 09/23/2016 and CT dated 09/06/2016 FINDINGS: Evaluation of this exam is limited in the absence of intravenous contrast. Lower chest: Stable 5 mm focus of calcification at the right lung base, likely an old granuloma. The visualized lung bases are otherwise clear. No intra-abdominal free air.  Small free fluid within the pelvis Hepatobiliary: No focal liver abnormality is seen. No gallstones, gallbladder wall thickening, or biliary dilatation. Pancreas: Unremarkable. No pancreatic ductal dilatation or surrounding inflammatory changes. Spleen: Normal in size without focal abnormality.  Adrenals/Urinary Tract: The adrenal glands appear unremarkable. Small nonobstructing bilateral renal calculi measuring up to 3 mm in the upper pole of the left kidney. No hydronephrosis. The visualized ureters and urinary bladder appear unremarkable. Stomach/Bowel: Evaluation of the bowel is limited in the absence of oral contrast. Multiple mildly dilated fluid-filled loops of small bowel  in the mid to lower abdomen measure up to 3 mm in diameter. The distal small bowel and terminal ileum are decompressed. The transition zone likely present within the pelvis anteriorly and likely secondary to adhesions. Small scattered sigmoid diverticula without active inflammatory changes. The appendix is not visualized with certainty. No inflammatory changes identified in the right lower quadrant. Vascular/Lymphatic: Mild aortoiliac atherosclerotic disease. The abdominal aorta and IVC are grossly unremarkable on this noncontrast study. No portal venous gas identified. There is no adenopathy. Reproductive: Small calcified fibroid from the anterior uterus. The uterus and ovaries are otherwise grossly unremarkable. Other: Midline vertical anterior pelvic wall incisional scar. Small fat containing umbilical hernia. The abdominal wall soft tissues are otherwise unremarkable. 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 No acute fracture. IMPRESSION: Distal small bowel obstruction with transition zone within the pelvis likely secondary to adhesions. Nonobstructing bilateral renal calculi.  No hydronephrosis. Electronically Signed   By: Anner Crete M.D.   On: 09/24/2016 01:57   Dg Abd Acute W/chest  Result Date: 09/24/2016 CLINICAL DATA:  Upper and mid abdominal pain today.  Distention. EXAM: DG ABDOMEN ACUTE W/ 1V CHEST COMPARISON:  09/07/2016 FINDINGS: Normal heart size and pulmonary vascularity. No focal airspace disease or consolidation  in the lungs. No blunting of costophrenic angles. No pneumothorax. Mediastinal contours appear intact. Scattered gas and stool throughout the colon. No small or large bowel distention. No free intra-abdominal air. No abnormal air-fluid levels. Central loops of small bowel are gas-filled without distention but suggest wall thickening. This may indicate areas of enteritis. No radiopaque stones. Degenerative changes in the spine and right hip. Previous left hip arthroplasty. IMPRESSION: No evidence of active pulmonary disease. Nonobstructive bowel gas pattern. Gas-filled mid abdominal small bowel with suggestion of wall thickening may indicate enteritis. Electronically Signed   By: Lucienne Capers M.D.   On: 09/24/2016 00:47     Assessment/Plan Principal Problem:   SBO (small bowel obstruction) Active Problems:   HIV disease (HCC)   CKD (chronic kidney disease) stage 3, GFR 30-59 ml/min    1. Small bowel obstruction - I have requested patient's nurse to place NG tube under suction. We'll keep patient nothing by mouth and on IV fluids and pain relief medications. Follow serial abdominal x-rays. General surgery has been consulted for further recommendations. 2. Chronic kidney disease stage III - creatinine appears to be at baseline. Closely follow metabolic panel. 3. History of HIV - patient states her last CD4 count was more than 500 with undetectable viral count. Presently patient is nothing by mouth due to small bowel obstruction. 4. History of tobacco abuse - patient states since her last discharge from the hospital 2 weeks ago patient has been using nicotine patch.  I have reviewed patient's old charts and labs under care everywhere.   DVT prophylaxis: SCDs. Code Status: Full code.  Family Communication: Discussed with patient.  Disposition Plan: Home.  Consults called: General surgery.  Admission status: Inpatient likely stay 2-3 days.    Rise Patience MD [facility] Pager [phone].  If 7PM-7AM, please contact night-coverage [email or web address] Password [email or web address]  09/24/2016, 6:09 AM

## 2016-09-24 NOTE — Progress Notes (Signed)
Patient arrival to room 1506. Alert and oriented x4. IV to left FA, site WNL. Patient c/o nausea. Given Zofran prior to transfer. Will continue to monitor.

## 2016-09-24 NOTE — Progress Notes (Signed)
Patient vomited large amount of tan, chunky emesis. Complains of abd pain and nausea. Cool cloth and support given, awaiting MD orders.

## 2016-09-24 NOTE — Progress Notes (Signed)
Same-day note History of present illness 60 y.o. female with history of HIV and chronic kidney disease and recurrent bowel obstruction was recently admitted 2 weeks ago at [facility] for bowel obstruction and was managed conservatively presents to the ER because of abdominal pain. Patient started experiencing abdominal pain last evening around 5 PM just mostly in the epigastrium which became more diffuse. CT of the abdomen and pelvis shows small bowel obstruction with transition point. ER physician had discussed with on-call general surgeon and patient is being admitted for further management of small bowel obstruction. Just before my exam patient had vomited 3 times. Abdomen is distended on exam. Last bowel movement was yesterday before the pain started. Patient has had previous history of hemicolectomy for colon cancer and is under remission.   Subjective: Patient reports feeling much better with NG in place Objective: Vital signs reviewed. Blood pressure stable, heart rate normal, patient afebrile Basic metabolic reviewed, creatinine 1.3, CBC unremarkable  Assessment plan:  1. Small bowel obstruction - Gen. surgery consulted. Patient continues with NG in place with notable symptomatic improvement. Records reviewed. Patient to undergo small bowel protocol abdominal x-[place] study. Continue nothing by mouth 2. Chronic kidney disease stage III - creatinine stable. Repeat basic metabolic in morning 3. History of HIV - patient states her last CD4 count was more than 500 with undetectable viral count. Table at present 4. History of tobacco abuse - patient states since her last discharge from the hospital 2 weeks ago patient has been using nicotine patch.

## 2016-09-24 NOTE — Progress Notes (Signed)
Advanced NG tube 6cm, clamped & administered gastrografin. Notified Joy in radiology. Xray will be done at 7:20pm.

## 2016-09-24 NOTE — ED Notes (Signed)
Last BM 1730 09/23/16 Last ate 1600 Sx onset 1900 Denies vomiting, diarrhea or bleeding C/o pain and nausea only

## 2016-09-25 DIAGNOSIS — N183 Chronic kidney disease, stage 3 (moderate): Secondary | ICD-10-CM

## 2016-09-25 LAB — BASIC METABOLIC PANEL
Anion gap: 5 (ref 5–15)
BUN: 15 mg/dL (ref 6–20)
CHLORIDE: 113 mmol/L — AB (ref 101–111)
CO2: 28 mmol/L (ref 22–32)
Calcium: 9 mg/dL (ref 8.9–10.3)
Creatinine, Ser: 1.36 mg/dL — ABNORMAL HIGH (ref 0.44–1.00)
GFR calc Af Amer: 48 mL/min — ABNORMAL LOW (ref >60)
GFR calc non Af Amer: 41 mL/min — ABNORMAL LOW (ref >60)
GLUCOSE: 124 mg/dL — AB (ref 65–99)
POTASSIUM: 3.8 mmol/L (ref 3.5–5.1)
SODIUM: 146 mmol/L — AB (ref 135–145)

## 2016-09-25 LAB — GLUCOSE, CAPILLARY
GLUCOSE-CAPILLARY: 150 mg/dL — AB (ref 65–99)
Glucose-Capillary: 142 mg/dL — ABNORMAL HIGH (ref 65–99)

## 2016-09-25 MED ORDER — SORBITOL 70 % SOLN
960.0000 mL | TOPICAL_OIL | Freq: Once | ORAL | Status: AC
  Administered 2016-09-25: 960 mL via RECTAL
  Filled 2016-09-25: qty 240

## 2016-09-25 MED ORDER — DEXTROSE-NACL 5-0.9 % IV SOLN
INTRAVENOUS | Status: AC
  Administered 2016-09-25 – 2016-09-26 (×2): via INTRAVENOUS

## 2016-09-25 MED ORDER — BISACODYL 10 MG RE SUPP: 10.0000 mg | Freq: Every day | RECTAL | Status: DC | PRN

## 2016-09-25 NOTE — Progress Notes (Signed)
Administered SMOG enema. Patient has had multiple stools. Patient has no c/o pain/nausea. Paged Surgical PA. New order to d/c NG tube.

## 2016-09-25 NOTE — Progress Notes (Signed)
PROGRESS NOTE    Ashley Shepherd  [id number] DOB: Dec 30, 1955 DOA: 09/23/2016 PCP: Marcia Brash, MD (Inactive)    Brief Narrative:  60 y.o.femalewith history of HIV and chronic kidney disease and recurrent bowel obstruction was recently admitted 2 weeks ago at [facility] for bowel obstruction and was managed conservatively presents to the ER because of abdominal pain. Patient started experiencing abdominal pain last evening around 5 PM just mostly in the epigastrium which became more diffuse. CT of the abdomen and pelvis shows small bowel obstruction with transition point. ER physician had discussed with on-call general surgeon and patient is being admitted for further management of small bowel obstruction. Just before my exam patient had vomited 3 times. Abdomen is distended on exam. Last bowel movement was yesterday before the pain started. Patient has had previous history of hemicolectomy for colon cancer and is under remission  Assessment & Plan:   Principal Problem:   Small bowel obstruction Active Problems:   HIV disease (HCC)   CKD (chronic kidney disease) stage 3, GFR 30-59 ml/min   Encounter for imaging study to confirm nasogastric (NG) tube placement   1. Abdominal pain with nausea and vomiting 1. General surgery following for concerns of possible small bowel obstruction and I have personally discussed case with general surgery this morning 2. Patient underwent small bowel protocol study with no significant evidence of small bowel obstruction.  3. Instead, patient noted to have stool in colon. 4. Per patient, she has a chronic history of constipation prior to admission and primarily resorts to straining when moving her bowels 5. Have ordered smog enema. Continue cathartics until good results 6. Diet has been advanced per general surgery 2. CK D stage III 1. Creatinine remained stable 2. We'll repeat basic metabolic panel morning 3. History of  HIV 1. Seems stable at present 2. Patient afebrile 3. We'll repeat CBC in morning 4. Straight tobacco abuse 1. Stable at present. Patient has been using nicotine patch prior to admission  DVT prophylaxis: SCDs Code Status: Full code Family Communication: Patient in room, family not at bedside Disposition Plan: Uncertain at this time  Consultants:   General surgery  Procedures:     Antimicrobials: Anti-infectives    None       Subjective: Reports feeling better today. Eager to start eating  Objective: Vitals:   09/24/16 0419 09/24/16 1455 09/24/16 2141 09/25/16 0513  BP: (!) 145/84 124/70 (!) 145/79 135/76  Pulse: 66 (!) 58 (!) 58 (!) 59  Resp: 18 18 18 18   Temp: 98.5 F (36.9 C) 98 F (36.7 C) 98.8 F (37.1 C) 98.5 F (36.9 C)  TempSrc: Oral Oral Oral Oral  SpO2: 97% 97% 98% 99%  Weight:      Height:        Intake/Output Summary (Last 24 hours) at 09/25/16 1351 Last data filed at 09/25/16 0900  Gross per 24 hour  Intake             1000 ml  Output             1210 ml  Net             -210 ml   Filed Weights   09/23/16 2152  Weight: 90.3 kg (199 lb)    Examination:  General exam: Appears calm and comfortable  Respiratory system: Clear to auscultation. Respiratory effort normal. Cardiovascular system: S1 & S2 heard, RRR. No JVD, murmurs, rubs, gallops or clicks. No pedal edema. Gastrointestinal  system: Abdomen is Mildly distended, decreased bowel sounds. Central nervous system: Alert and oriented. No focal neurological deficits. Extremities: Symmetric 5 x 5 power. Skin: No rashes, lesions Psychiatry: Judgement and insight appear normal. Mood & affect appropriate.   Data Reviewed: I have personally reviewed following labs and imaging studies  CBC:  Recent Labs Lab 09/24/16 0045 09/24/16 0639  WBC 9.3 8.4  NEUTROABS 6.4 6.8  HGB 14.1 13.7  HCT 40.7 40.7  MCV 91.1 92.5  PLT 186 [id number]   Basic Metabolic Panel:  Recent Labs Lab  09/24/16 0045 09/24/16 0639 09/25/16 0539  NA 142 141 146*  K 4.5 4.2 3.8  CL 107 108 113*  CO2 29 26 28   GLUCOSE 133* 132* 124*  BUN 17 18 15   CREATININE 1.44* 1.30* 1.36*  CALCIUM 10.1 9.6 9.0   GFR: Estimated Creatinine Clearance: 54.1 mL/min (by C-G formula based on SCr of 1.36 mg/dL (H)). Liver Function Tests:  Recent Labs Lab 09/24/16 0045 09/24/16 0639  AST 21 24  ALT 8* 11*  ALKPHOS 106 102  BILITOT 0.6 0.8  PROT 7.9 7.5  ALBUMIN 3.9 3.9    Recent Labs Lab 09/24/16 0045  LIPASE 20   No results for input(s): AMMONIA in the last 168 hours. Coagulation Profile: No results for input(s): INR, PROTIME in the last 168 hours. Cardiac Enzymes: No results for input(s): CKTOTAL, CKMB, CKMBINDEX, TROPONINI in the last 168 hours. BNP (last 3 results) No results for input(s): PROBNP in the last 8760 hours. HbA1C: No results for input(s): HGBA1C in the last 72 hours. CBG:  Recent Labs Lab 09/24/16 0805 09/24/16 1734 09/24/16 2357  GLUCAP 180* 114* 113*   Lipid Profile: No results for input(s): CHOL, HDL, LDLCALC, TRIG, CHOLHDL, LDLDIRECT in the last 72 hours. Thyroid Function Tests: No results for input(s): TSH, T4TOTAL, FREET4, T3FREE, THYROIDAB in the last 72 hours. Anemia Panel: No results for input(s): VITAMINB12, FOLATE, FERRITIN, TIBC, IRON, RETICCTPCT in the last 72 hours. Sepsis Labs: No results for input(s): PROCALCITON, LATICACIDVEN in the last 168 hours.  Recent Results (from the past 240 hour(s))  MRSA PCR Screening     Status: Abnormal   Collection Time: 09/24/16  5:27 AM  Result Value Ref Range Status   MRSA by PCR POSITIVE (A) NEGATIVE Final    Comment:        The GeneXpert MRSA Assay (FDA approved for NASAL specimens only), is one component of a comprehensive MRSA colonization surveillance program. It is not intended to diagnose MRSA infection nor to guide or monitor treatment for MRSA infections. RESULT CALLED TO, READ BACK BY AND  VERIFIED WITH: RYAN,K @ 1416 ON [id number] BY POTEAT,S      Radiology Studies: Ct Abdomen Pelvis Wo Contrast  Result Date: 09/24/2016 CLINICAL DATA:  60 year old female with abdominal pain. EXAM: CT ABDOMEN AND PELVIS WITHOUT CONTRAST TECHNIQUE: Multidetector CT imaging of the abdomen and pelvis was performed following the standard protocol without IV contrast. COMPARISON:  Abdominal radiograph dated 09/23/2016 and CT dated 09/06/2016 FINDINGS: Evaluation of this exam is limited in the absence of intravenous contrast. Lower chest: Stable 5 mm focus of calcification at the right lung base, likely an old granuloma. The visualized lung bases are otherwise clear. No intra-abdominal free air.  Small free fluid within the pelvis Hepatobiliary: No focal liver abnormality is seen. No gallstones, gallbladder wall thickening, or biliary dilatation. Pancreas: Unremarkable. No pancreatic ductal dilatation or surrounding inflammatory changes. Spleen: Normal in size without focal abnormality. Adrenals/Urinary Tract: The  adrenal glands appear unremarkable. Small nonobstructing bilateral renal calculi measuring up to 3 mm in the upper pole of the left kidney. No hydronephrosis. The visualized ureters and urinary bladder appear unremarkable. Stomach/Bowel: Evaluation of the bowel is limited in the absence of oral contrast. Multiple mildly dilated fluid-filled loops of small bowel in the mid to lower abdomen measure up to 3 mm in diameter. The distal small bowel and terminal ileum are decompressed. The transition zone likely present within the pelvis anteriorly and likely secondary to adhesions. Small scattered sigmoid diverticula without active inflammatory changes. The appendix is not visualized with certainty. No inflammatory changes identified in the right lower quadrant. Vascular/Lymphatic: Mild aortoiliac atherosclerotic disease. The abdominal aorta and IVC are grossly unremarkable on this noncontrast study. No portal  venous gas identified. There is no adenopathy. Reproductive: Small calcified fibroid from the anterior uterus. The uterus and ovaries are otherwise grossly unremarkable. Other: Midline vertical anterior pelvic wall incisional scar. Small fat containing umbilical hernia. The abdominal wall soft tissues are otherwise unremarkable. 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 No acute fracture. IMPRESSION: Distal small bowel obstruction with transition zone within the pelvis likely secondary to adhesions. Nonobstructing bilateral renal calculi.  No hydronephrosis. Electronically Signed   By: Anner Crete M.D.   On: 09/24/2016 01:57   Dg Abd Acute W/chest  Result Date: 09/24/2016 CLINICAL DATA:  Upper and mid abdominal pain today.  Distention. EXAM: DG ABDOMEN ACUTE W/ 1V CHEST COMPARISON:  09/07/2016 FINDINGS: Normal heart size and pulmonary vascularity. No focal airspace disease or consolidation in the lungs. No blunting of costophrenic angles. No pneumothorax. Mediastinal contours appear intact. Scattered gas and stool throughout the colon. No small or large bowel distention. No free intra-abdominal air. No abnormal air-fluid levels. Central loops of small bowel are gas-filled without distention but suggest wall thickening. This may indicate areas of enteritis. No radiopaque stones. Degenerative changes in the spine and right hip. Previous left hip arthroplasty. IMPRESSION: No evidence of active pulmonary disease. Nonobstructive bowel gas pattern. Gas-filled mid abdominal small bowel with suggestion of wall thickening may indicate enteritis. Electronically Signed   By: Lucienne Capers M.D.   On: 09/24/2016 00:47   Dg Abd Portable 1v-small Bowel Obstruction Protocol-initial, 8 Hr Delay  Result Date: 09/24/2016 CLINICAL DATA:  Distal small bowel obstruction seen on CT EXAM: PORTABLE ABDOMEN - 1 VIEW COMPARISON:  KUB  from 09/24/2016, CT abdomen and pelvis from 09/24/2016 FINDINGS: Oral contrast is seen within the stomach and proximal duodenum. Tip of gastric tube is noted in the region of the gastric antrum. There also appears to be contrast opacification along the ascending and proximal transverse colon. Moderate colonic fecal retention. No small bowel dilatation is apparent. Phleboliths are seen in the pelvis. Calcified uterine fibroid in the mid pelvis. Left total noncemented hip arthroplasty is partially imaged without evidence of hardware failure. Lower lumbar degenerative facet arthropathy at L4-5 and L5-S1. IMPRESSION: Enteric contrast is seen in the ascending and proximal transverse colon without significant small bowel dilatation. Lack of significant small bowel dilatation and contrast within large bowel suggests resolution of distal small bowel obstruction or is at least a partial SBO, favor the former. Electronically Signed   By: Ashley Royalty M.D.   On: 09/24/2016 21:36   Dg Abd Portable 1v-small Bowel Protocol-position Verification  Result Date: 09/24/2016 CLINICAL DATA:  Nasogastric tube placement EXAM: PORTABLE ABDOMEN - 1 VIEW COMPARISON:  CT abdomen  and pelvis September 24, 2016 FINDINGS: Nasogastric tube tip is in the proximal stomach with the side port at the gastroesophageal junction. There is moderate stool throughout the colon. There is no bowel dilatation or air-fluid level to indicate bowel obstruction by radiography. No free air. Visualized lung bases are clear. IMPRESSION: Nasogastric tube tip in proximal stomach with side port at the gastroesophageal junction. Advise advancing nasogastric tube 5-6 cm to insure that the tube and side port are well within the stomach. Bowel gas pattern unremarkable. These results will be called to the ordering clinician or representative by the Radiologist Assistant, and communication documented in the PACS or zVision Dashboard. Electronically Signed   By: Lowella Grip III M.D.   On: 09/24/2016 09:13    Scheduled Meds: . Chlorhexidine Gluconate Cloth  6 each Topical Q0600  . mupirocin ointment  1 application Nasal BID   Continuous Infusions:    LOS: 1 day   CHIU, Orpah Melter, MD [facility] Pager [phone]  If 7PM-7AM, please contact night-coverage [email or web address] Password [email or web address] 09/25/2016, 1:51 PM

## 2016-09-25 NOTE — Progress Notes (Signed)
[facility] Progress Note     Subjective: Comfortable. Abdominal pain and nausea have resolved. +flatus. No BM. Has not ambulated.  Anxious, takes outpatient meds for panic attacks. Reports she is afraid of getting sick and dying.  Objective: Vital signs in last 24 hours: Temp:  [98 F (36.7 C)-98.8 F (37.1 C)] 98.5 F (36.9 C) (10/03 0513) Pulse Rate:  [58-59] 59 (10/03 0513) Resp:  [18] 18 (10/03 0513) BP: (124-145)/(70-79) 135/76 (10/03 0513) SpO2:  [97 %-99 %] 99 % (10/03 0513) Last BM Date: 09/23/16  Intake/Output from previous day: 10/02 0701 - 10/03 0700 In: 1000 [I.V.:1000] Out: 1210 [Urine:500; Emesis/NG output:710] Intake/Output this shift: No intake/output data recorded.  PE: Gen:  Alert, cooperative, obviously anxious HEENT: NGT in place. Total output 24h: 700cc Pulm:  CTABL Abd: Soft, NT, mild distention, few BS Ext:  No erythema, edema, or tenderness   Lab Results:   Recent Labs  09/24/16 0045 09/24/16 0639  WBC 9.3 8.4  HGB 14.1 13.7  HCT 40.7 40.7  PLT 186 191   BMET  Recent Labs  09/24/16 0639 09/25/16 0539  NA 141 146*  K 4.2 3.8  CL 108 113*  CO2 26 28  GLUCOSE 132* 124*  BUN 18 15  CREATININE 1.30* 1.36*  CALCIUM 9.6 9.0   PT/INR No results for input(s): LABPROT, INR in the last 72 hours. CMP     Component Value Date/Time   NA 146 (H) 09/25/2016 0539   K 3.8 09/25/2016 0539   CL 113 (H) 09/25/2016 0539   CO2 28 09/25/2016 0539   GLUCOSE 124 (H) 09/25/2016 0539   BUN 15 09/25/2016 0539   CREATININE 1.36 (H) 09/25/2016 0539   CALCIUM 9.0 09/25/2016 0539   PROT 7.5 09/24/2016 0639   ALBUMIN 3.9 09/24/2016 0639   AST 24 09/24/2016 0639   ALT 11 (L) 09/24/2016 0639   ALKPHOS 102 09/24/2016 0639   BILITOT 0.8 09/24/2016 0639   GFRNONAA 41 (L) 09/25/2016 0539   GFRAA 48 (L) 09/25/2016 0539   Lipase     Component Value Date/Time   LIPASE 20 09/24/2016 0045   Studies/Results: Ct Abdomen Pelvis Wo  Contrast  Result Date: 09/24/2016 CLINICAL DATA:  60 year old female with abdominal pain. EXAM: CT ABDOMEN AND PELVIS WITHOUT CONTRAST TECHNIQUE: Multidetector CT imaging of the abdomen and pelvis was performed following the standard protocol without IV contrast. COMPARISON:  Abdominal radiograph dated 09/23/2016 and CT dated 09/06/2016 FINDINGS: Evaluation of this exam is limited in the absence of intravenous contrast. Lower chest: Stable 5 mm focus of calcification at the right lung base, likely an old granuloma. The visualized lung bases are otherwise clear. No intra-abdominal free air.  Small free fluid within the pelvis Hepatobiliary: No focal liver abnormality is seen. No gallstones, gallbladder wall thickening, or biliary dilatation. Pancreas: Unremarkable. No pancreatic ductal dilatation or surrounding inflammatory changes. Spleen: Normal in size without focal abnormality. Adrenals/Urinary Tract: The adrenal glands appear unremarkable. Small nonobstructing bilateral renal calculi measuring up to 3 mm in the upper pole of the left kidney. No hydronephrosis. The visualized ureters and urinary bladder appear unremarkable. Stomach/Bowel: Evaluation of the bowel is limited in the absence of oral contrast. Multiple mildly dilated fluid-filled loops of small bowel in the mid to lower abdomen measure up to 3 mm in diameter. The distal small bowel and terminal ileum are decompressed. The transition zone likely present within the pelvis anteriorly and likely secondary to adhesions. Small scattered sigmoid diverticula without active inflammatory  changes. The appendix is not visualized with certainty. No inflammatory changes identified in the right lower quadrant. Vascular/Lymphatic: Mild aortoiliac atherosclerotic disease. The abdominal aorta and IVC are grossly unremarkable on this noncontrast study. No portal venous gas identified. There is no adenopathy. Reproductive: Small calcified fibroid from the anterior  uterus. The uterus and ovaries are otherwise grossly unremarkable. Other: Midline vertical anterior pelvic wall incisional scar. Small fat containing umbilical hernia. The abdominal wall soft tissues are otherwise unremarkable. 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 No acute fracture. IMPRESSION: Distal small bowel obstruction with transition zone within the pelvis likely secondary to adhesions. Nonobstructing bilateral renal calculi.  No hydronephrosis. Electronically Signed   By: Anner Crete M.D.   On: 09/24/2016 01:57   Dg Abd Acute W/chest  Result Date: 09/24/2016 CLINICAL DATA:  Upper and mid abdominal pain today.  Distention. EXAM: DG ABDOMEN ACUTE W/ 1V CHEST COMPARISON:  09/07/2016 FINDINGS: Normal heart size and pulmonary vascularity. No focal airspace disease or consolidation in the lungs. No blunting of costophrenic angles. No pneumothorax. Mediastinal contours appear intact. Scattered gas and stool throughout the colon. No small or large bowel distention. No free intra-abdominal air. No abnormal air-fluid levels. Central loops of small bowel are gas-filled without distention but suggest wall thickening. This may indicate areas of enteritis. No radiopaque stones. Degenerative changes in the spine and right hip. Previous left hip arthroplasty. IMPRESSION: No evidence of active pulmonary disease. Nonobstructive bowel gas pattern. Gas-filled mid abdominal small bowel with suggestion of wall thickening may indicate enteritis. Electronically Signed   By: Lucienne Capers M.D.   On: 09/24/2016 00:47   Dg Abd Portable 1v-small Bowel Obstruction Protocol-initial, 8 Hr Delay  Result Date: 09/24/2016 CLINICAL DATA:  Distal small bowel obstruction seen on CT EXAM: PORTABLE ABDOMEN - 1 VIEW COMPARISON:  KUB from 09/24/2016, CT abdomen and pelvis from 09/24/2016 FINDINGS: Oral contrast is seen within the stomach  and proximal duodenum. Tip of gastric tube is noted in the region of the gastric antrum. There also appears to be contrast opacification along the ascending and proximal transverse colon. Moderate colonic fecal retention. No small bowel dilatation is apparent. Phleboliths are seen in the pelvis. Calcified uterine fibroid in the mid pelvis. Left total noncemented hip arthroplasty is partially imaged without evidence of hardware failure. Lower lumbar degenerative facet arthropathy at L4-5 and L5-S1. IMPRESSION: Enteric contrast is seen in the ascending and proximal transverse colon without significant small bowel dilatation. Lack of significant small bowel dilatation and contrast within large bowel suggests resolution of distal small bowel obstruction or is at least a partial SBO, favor the former. Electronically Signed   By: Ashley Royalty M.D.   On: 09/24/2016 21:36   Dg Abd Portable 1v-small Bowel Protocol-position Verification  Result Date: 09/24/2016 CLINICAL DATA:  Nasogastric tube placement EXAM: PORTABLE ABDOMEN - 1 VIEW COMPARISON:  CT abdomen and pelvis September 24, 2016 FINDINGS: Nasogastric tube tip is in the proximal stomach with the side port at the gastroesophageal junction. There is moderate stool throughout the colon. There is no bowel dilatation or air-fluid level to indicate bowel obstruction by radiography. No free air. Visualized lung bases are clear. IMPRESSION: Nasogastric tube tip in proximal stomach with side port at the gastroesophageal junction. Advise advancing nasogastric tube 5-6 cm to insure that the tube and side port are well within the stomach. Bowel gas pattern unremarkable. These results will be called to the ordering  clinician or representative by the Radiologist Assistant, and communication documented in the PACS or zVision Dashboard. Electronically Signed   By: Lowella Grip III M.D.   On: 09/24/2016 09:13   Assessment/Plan Small Bowel Obstruction - NGT to LIWS  -  Gastrografin administration 09/24/16, delay abdominal films show contrast in colon with improvement of small bowel dilatation. - clamp NGT - dulcolax suppository to help stimulate bowel function. May need enema if this fails.  - OOB/encourage ambulation  FEN: NGT clamping trial, ice chips.  Dispo: NGT clamping trial, possible d/c of NGT this afternoon. Dulcolax.    LOS: 1 day    [facility] 09/25/2016, 10:14 AM Pager: [phone] Consults: [phone] Mon-Fri 7:00 am-4:30 pm Sat-Sun 7:00 am-11:30 am

## 2016-09-26 DIAGNOSIS — K56609 Unspecified intestinal obstruction, unspecified as to partial versus complete obstruction: Secondary | ICD-10-CM

## 2016-09-26 LAB — GLUCOSE, CAPILLARY: GLUCOSE-CAPILLARY: 137 mg/dL — AB (ref 65–99)

## 2016-09-26 NOTE — Progress Notes (Signed)
Discharge instructions given to pt, verbalized understanding. Left the unit in stable condition. 

## 2016-09-26 NOTE — Progress Notes (Signed)
[facility] Progress Note     Subjective: Looks much better than yesterday. Denies abdominal pain. Tolerating clears. Having flatus and BM Reports streak of blood with BM, also reports PMH hemorrhoids.  Objective: Vital signs in last 24 hours: Temp:  [98.2 F (36.8 C)-99.1 F (37.3 C)] 98.7 F (37.1 C) (10/04 0517) Pulse Rate:  [54-58] 54 (10/04 0517) Resp:  [16-20] 16 (10/04 0517) BP: (126-153)/(77-79) 126/79 (10/04 0517) SpO2:  [98 %-100 %] 98 % (10/04 0517) Last BM Date: 09/23/16  Intake/Output from previous day: 10/03 0701 - 10/04 0700 In: 1430 [I.V.:1430] Out: -  Intake/Output this shift: Total I/O In: -  Out: 200 [Urine:200]  PE: Gen:  Alert, cooperative Pulm:  CTABL Abd: Soft, NT, mild distention, +BS Ext:  No erythema, edema, or tenderness   Lab Results:   Recent Labs  09/24/16 0045 09/24/16 0639  WBC 9.3 8.4  HGB 14.1 13.7  HCT 40.7 40.7  PLT 186 191   BMET  Recent Labs  09/24/16 0639 09/25/16 0539  NA 141 146*  K 4.2 3.8  CL 108 113*  CO2 26 28  GLUCOSE 132* 124*  BUN 18 15  CREATININE 1.30* 1.36*  CALCIUM 9.6 9.0   CMP     Component Value Date/Time   NA 146 (H) 09/25/2016 0539   K 3.8 09/25/2016 0539   CL 113 (H) 09/25/2016 0539   CO2 28 09/25/2016 0539   GLUCOSE 124 (H) 09/25/2016 0539   BUN 15 09/25/2016 0539   CREATININE 1.36 (H) 09/25/2016 0539   CALCIUM 9.0 09/25/2016 0539   PROT 7.5 09/24/2016 0639   ALBUMIN 3.9 09/24/2016 0639   AST 24 09/24/2016 0639   ALT 11 (L) 09/24/2016 0639   ALKPHOS 102 09/24/2016 0639   BILITOT 0.8 09/24/2016 0639   GFRNONAA 41 (L) 09/25/2016 0539   GFRAA 48 (L) 09/25/2016 0539   Lipase     Component Value Date/Time   LIPASE 20 09/24/2016 0045   Studies/Results: Dg Abd Portable 1v-small Bowel Obstruction Protocol-initial, 8 Hr Delay  Result Date: 09/24/2016 CLINICAL DATA:  Distal small bowel obstruction seen on CT EXAM: PORTABLE ABDOMEN - 1 VIEW COMPARISON:  KUB from  09/24/2016, CT abdomen and pelvis from 09/24/2016 FINDINGS: Oral contrast is seen within the stomach and proximal duodenum. Tip of gastric tube is noted in the region of the gastric antrum. There also appears to be contrast opacification along the ascending and proximal transverse colon. Moderate colonic fecal retention. No small bowel dilatation is apparent. Phleboliths are seen in the pelvis. Calcified uterine fibroid in the mid pelvis. Left total noncemented hip arthroplasty is partially imaged without evidence of hardware failure. Lower lumbar degenerative facet arthropathy at L4-5 and L5-S1. IMPRESSION: Enteric contrast is seen in the ascending and proximal transverse colon without significant small bowel dilatation. Lack of significant small bowel dilatation and contrast within large bowel suggests resolution of distal small bowel obstruction or is at least a partial SBO, favor the former. Electronically Signed   By: Ashley Royalty M.D.   On: 09/24/2016 21:36   Assessment/Plan Small Bowel Obstruction PMH multiple abdominal surgeries - SB protocol per NGT 09/24/16, delay abdominal films show contrast in colon with improvement of small bowel dilatation.  - NGT discontinued 09/25/16 - multiple BMS after SMOG enema.   FEN: full liquids, advance diet as tolerated. ** pt states that since her colostomy reversal in 2013 she has required daily imodium to manage loose stools. Discussed the importance of only taking imodium  PRN and taking Miralax daily as needed to keep BMs regular.   General surgery will sign off. We will be available as needed.   LOS: 2 days    Jill Alexanders , [facility] 09/26/2016, 10:37 AM Pager: [phone] Consults: [phone] Mon-Fri 7:00 am-4:30 pm Sat-Sun 7:00 am-11:30 am

## 2016-09-26 NOTE — Discharge Summary (Signed)
Discharge Summary  Ashley Shepherd [id number] DOB: [birth year]  PCP: Marcia Brash, MD (Inactive)  Admit date: 09/23/2016 Discharge date: 09/26/2016  Time spent: <110mins  Recommendations for Outpatient Follow-up:  1. F/u with PMD within a week  for hospital discharge follow up, repeat cbc/bmp at follow up  Discharge Diagnoses:  Active Hospital Problems   Diagnosis Date Noted  . SBO (small bowel obstruction) 09/24/2016  . Encounter for imaging study to confirm nasogastric (NG) tube placement   . HIV disease ([place])   . CKD (chronic kidney disease) stage 3, GFR 30-59 ml/min     Resolved Hospital Problems   Diagnosis Date Noted Date Resolved  No resolved problems to display.    Discharge Condition: stable  Diet recommendation: full liquid to soft diet, advance diet as tolerated  Filed Weights   09/23/16 2152  Weight: 90.3 kg (199 lb)    History of present illness:  Chief Complaint: Abdominal pain nausea vomiting.  HPI: Ashley Shepherd is a 60 y.o. female with history of HIV and chronic kidney disease and recurrent bowel obstruction was recently admitted 2 weeks ago at [facility] for bowel obstruction and was managed conservatively presents to the ER because of abdominal pain. Patient started experiencing abdominal pain last evening around 5 PM just mostly in the epigastrium which became more diffuse. CT of the abdomen and pelvis shows small bowel obstruction with transition point. ER physician had discussed with on-call general surgeon and patient is being admitted for further management of small bowel obstruction. Just before my exam patient had vomited 3 times. Abdomen is distended on exam. Last bowel movement was yesterday before the pain started. Patient has had previous history of hemicolectomy for colon cancer and is under remission.   ED Course: CT of abdomen and pelvis shows small bowel obstruction with transition point.   Hospital Course:    Principal Problem:   SBO (small bowel obstruction) Active Problems:   HIV disease ([facility])   CKD (chronic kidney disease) stage 3, GFR 30-59 ml/min   Encounter for imaging study to confirm nasogastric (NG) tube placement   1. Abdominal pain with nausea and vomiting 1. Patient underwent small bowel protocol study with no significant evidence of small bowel obstruction.  2. Instead, patient noted to have stool in colon. 3. Per patient, she has a chronic history of constipation prior to admission and primarily resorts to straining when moving her bowels, she also report choronic diarrhea that she takes imodium for it 4. Have ordered smog enema. Continue cathartics until good results 5. Diet has been advanced per general surgery, she is improving, symptom resuolved, she is cleared to discharge home by general surgery 2. CK D stage III 1. Creatinine remained stable 3. History of HIV 1. Seems stable at present 2. Patient afebrile, continue home meds 4. Straight tobacco abuse 1. Stable at present. Patient has been using nicotine patch prior to admission  DVT prophylaxis: SCDs Code Status: Full code Family Communication: Patient in room, family at bedside Disposition Plan: home on 10/4  Consultants:   General surgery  Procedures:   Ng placement on 10/2 and discontinued on 10/3  Antimicrobials:     Anti-infectives    None    Discharge Exam: BP (!) 149/76 (BP Location: Right Arm)   Pulse 60   Temp 98.3 F (36.8 C) (Oral)   Resp 18   Ht 5' 10.5" (1.791 m)   Wt 90.3 kg (199 lb)   SpO2 100%  BMI 28.15 kg/m   General: NAD Cardiovascular: RRR Respiratory: CTABL Abdominal : soft, nontender, +bs  Discharge Instructions You were cared for by a hospitalist during your hospital stay. If you have any questions about your discharge medications or the care you received while you were in the hospital after you are discharged, you can call the unit and asked to speak with the  hospitalist on call if the hospitalist that took care of you is not available. Once you are discharged, your primary care physician will handle any further medical issues. Please note that NO REFILLS for any discharge medications will be authorized once you are discharged, as it is imperative that you return to your primary care physician (or establish a relationship with a primary care physician if you do not have one) for your aftercare needs so that they can reassess your need for medications and monitor your lab values.  Discharge Instructions    Diet general    Complete by:  As directed    Full liquid to soft diet, small amount each time, advance diet as tolerated in the next two weeks   Increase activity slowly    Complete by:  As directed        Medication List    STOP taking these medications   oxyCODONE-acetaminophen 5-325 MG tablet Commonly known as:  PERCOCET     TAKE these medications   ALPRAZolam 1 MG tablet Commonly known as:  XANAX Take 1 mg by mouth at bedtime as needed for anxiety.   aspirin EC 81 MG tablet Take 81 mg by mouth daily.   CALCIUM + D PO Take 1 tablet by mouth daily.   citalopram 20 MG tablet Commonly known as:  CELEXA Take 20 mg by mouth every morning.   DESCOVY 200-25 MG tablet Generic drug:  emtricitabine-tenofovir AF Take 1 tablet by mouth daily.   dicyclomine 10 MG capsule Commonly known as:  BENTYL Take 10 mg by mouth 4 (four) times daily as needed for spasms.   ferrous sulfate 325 (65 FE) MG tablet Take 325 mg by mouth 2 (two) times daily.   maraviroc 300 MG tablet Commonly known as:  SELZENTRY Take 300 mg by mouth 2 (two) times daily. Reported on 01/24/2016   methocarbamol 500 MG tablet Commonly known as:  ROBAXIN Take 1 tablet (500 mg total) by mouth every 6 (six) hours as needed for muscle spasms.   multivitamin with minerals Tabs tablet Take 1 tablet by mouth daily.   ondansetron 4 MG tablet Commonly known as:   ZOFRAN Take 1 tablet (4 mg total) by mouth every 6 (six) hours as needed for nausea or vomiting.   pantoprazole 40 MG tablet Commonly known as:  PROTONIX Take 40 mg by mouth daily.   TIVICAY 50 MG tablet Generic drug:  dolutegravir Take 50 mg by mouth 2 (two) times daily.      Allergies  Allergen Reactions  . Codeine Itching  . Ivp Dye [Iodinated Diagnostic Agents] Itching    Pt stated that she had NO problem/reaction to topical iodine/betadine solution.  Ignacia Bayley Antibiotics Itching   Follow-up Information    MARX,RICHARD, MD .   Specialty:  Internal Medicine Contact information: [address] [place] [facility] [id number] [place]:[id number]        Marcelyn Ditty, MD Follow up in 1 week(s).   Specialty:  Internal Medicine Why:  hospital discharge follow up, repeat cbc/bmp at follow up. Contact information: [address]  [place] [address] [id number]            The results of significant diagnostics from this hospitalization (including imaging, microbiology, ancillary and laboratory) are listed below for reference.    Significant Diagnostic Studies: Ct Abdomen Pelvis Wo Contrast  Result Date: 09/24/2016 CLINICAL DATA:  60 year old female with abdominal pain. EXAM: CT ABDOMEN AND PELVIS WITHOUT CONTRAST TECHNIQUE: Multidetector CT imaging of the abdomen and pelvis was performed following the standard protocol without IV contrast. COMPARISON:  Abdominal radiograph dated 09/23/2016 and CT dated 09/06/2016 FINDINGS: Evaluation of this exam is limited in the absence of intravenous contrast. Lower chest: Stable 5 mm focus of calcification at the right lung base, likely an old granuloma. The visualized lung bases are otherwise clear. No intra-abdominal free air.  Small free fluid within the pelvis Hepatobiliary: No focal liver abnormality is seen. No gallstones, gallbladder wall thickening, or biliary dilatation. Pancreas: Unremarkable. No  pancreatic ductal dilatation or surrounding inflammatory changes. Spleen: Normal in size without focal abnormality. Adrenals/Urinary Tract: The adrenal glands appear unremarkable. Small nonobstructing bilateral renal calculi measuring up to 3 mm in the upper pole of the left kidney. No hydronephrosis. The visualized ureters and urinary bladder appear unremarkable. Stomach/Bowel: Evaluation of the bowel is limited in the absence of oral contrast. Multiple mildly dilated fluid-filled loops of small bowel in the mid to lower abdomen measure up to 3 mm in diameter. The distal small bowel and terminal ileum are decompressed. The transition zone likely present within the pelvis anteriorly and likely secondary to adhesions. Small scattered sigmoid diverticula without active inflammatory changes. The appendix is not visualized with certainty. No inflammatory changes identified in the right lower quadrant. Vascular/Lymphatic: Mild aortoiliac atherosclerotic disease. The abdominal aorta and IVC are grossly unremarkable on this noncontrast study. No portal venous gas identified. There is no adenopathy. Reproductive: Small calcified fibroid from the anterior uterus. The uterus and ovaries are otherwise grossly unremarkable. Other: Midline vertical anterior pelvic wall incisional scar. Small fat containing umbilical hernia. The abdominal wall soft tissues are otherwise unremarkable. 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 No acute fracture. IMPRESSION: Distal small bowel obstruction with transition zone within the pelvis likely secondary to adhesions. Nonobstructing bilateral renal calculi.  No hydronephrosis. Electronically Signed   By: Anner Crete M.D.   On: 09/24/2016 01:57   Ct Abdomen Pelvis Wo Contrast  Result Date: 09/06/2016 CLINICAL DATA:  Epigastric pain with vomiting and bloating EXAM: CT ABDOMEN AND PELVIS WITHOUT  CONTRAST TECHNIQUE: Multidetector CT imaging of the abdomen and pelvis was performed following the standard protocol without IV contrast. COMPARISON:  CT abdomen pelvis 01/11/2016 new. FINDINGS: Lower chest: No pulmonary nodules. No visible pleural or pericardial effusion. Hepatobiliary: Normal hepatic size and contours. No perihepatic ascites. No intra- or extrahepatic biliary dilatation. Normal gallbladder. Pancreas: Normal pancreatic contours. No peripancreatic fluid collection or pancreatic ductal dilatation. Spleen: Normal. Adrenals/Urinary Tract: Normal adrenal glands. There are bilateral nonobstructing renal calculi measuring up to 4 mm. No hydronephrosis. No perinephric stranding. Stomach/Bowel: There are multiple loops of dilated ileum within the lower abdomen. No clear transition point is identified. No fluid collection. No evidence of acute inflammation. Appendix not clearly visualized. Vascular/Lymphatic: Mild aortic atherosclerotic calcification. No abdominal or pelvic adenopathy. Reproductive: Evaluation of the uterus and ovaries is partially obscured by streak artifact from left total hip arthroplasty. No focal abnormality identified. Musculoskeletal: Status post left total hip arthroplasty. No focal osseous lesion. No bony  spinal canal stenosis. Normal visualized extrathoracic and extraperitoneal soft tissues. Other: No contributory non-categorized findings. IMPRESSION: 1. Multiple mildly dilated loops of ileum within the lower abdomen without a clear transition point identified. The findings may indicate early small bowel obstruction. 2. No evidence of metastatic disease to the abdomen or pelvis on this noncontrast examination. Electronically Signed   By: Ulyses Jarred M.D.   On: 09/06/2016 05:48   Dg Abd Acute W/chest  Result Date: 09/24/2016 CLINICAL DATA:  Upper and mid abdominal pain today.  Distention. EXAM: DG ABDOMEN ACUTE W/ 1V CHEST COMPARISON:  09/07/2016 FINDINGS: Normal heart size  and pulmonary vascularity. No focal airspace disease or consolidation in the lungs. No blunting of costophrenic angles. No pneumothorax. Mediastinal contours appear intact. Scattered gas and stool throughout the colon. No small or large bowel distention. No free intra-abdominal air. No abnormal air-fluid levels. Central loops of small bowel are gas-filled without distention but suggest wall thickening. This may indicate areas of enteritis. No radiopaque stones. Degenerative changes in the spine and right hip. Previous left hip arthroplasty. IMPRESSION: No evidence of active pulmonary disease. Nonobstructive bowel gas pattern. Gas-filled mid abdominal small bowel with suggestion of wall thickening may indicate enteritis. Electronically Signed   By: Lucienne Capers M.D.   On: 09/24/2016 00:47   Dg Abd Portable 1v-small Bowel Obstruction Protocol-initial, 8 Hr Delay  Result Date: 09/24/2016 CLINICAL DATA:  Distal small bowel obstruction seen on CT EXAM: PORTABLE ABDOMEN - 1 VIEW COMPARISON:  KUB from 09/24/2016, CT abdomen and pelvis from 09/24/2016 FINDINGS: Oral contrast is seen within the stomach and proximal duodenum. Tip of gastric tube is noted in the region of the gastric antrum. There also appears to be contrast opacification along the ascending and proximal transverse colon. Moderate colonic fecal retention. No small bowel dilatation is apparent. Phleboliths are seen in the pelvis. Calcified uterine fibroid in the mid pelvis. Left total noncemented hip arthroplasty is partially imaged without evidence of hardware failure. Lower lumbar degenerative facet arthropathy at L4-5 and L5-S1. IMPRESSION: Enteric contrast is seen in the ascending and proximal transverse colon without significant small bowel dilatation. Lack of significant small bowel dilatation and contrast within large bowel suggests resolution of distal small bowel obstruction or is at least a partial SBO, favor the former. Electronically Signed    By: Ashley Royalty M.D.   On: 09/24/2016 21:36   Dg Abd Portable 1v-small Bowel Protocol-position Verification  Result Date: 09/24/2016 CLINICAL DATA:  Nasogastric tube placement EXAM: PORTABLE ABDOMEN - 1 VIEW COMPARISON:  CT abdomen and pelvis September 24, 2016 FINDINGS: Nasogastric tube tip is in the proximal stomach with the side port at the gastroesophageal junction. There is moderate stool throughout the colon. There is no bowel dilatation or air-fluid level to indicate bowel obstruction by radiography. No free air. Visualized lung bases are clear. IMPRESSION: Nasogastric tube tip in proximal stomach with side port at the gastroesophageal junction. Advise advancing nasogastric tube 5-6 cm to insure that the tube and side port are well within the stomach. Bowel gas pattern unremarkable. These results will be called to the ordering clinician or representative by the Radiologist Assistant, and communication documented in the PACS or zVision Dashboard. Electronically Signed   By: Lowella Grip III M.D.   On: 09/24/2016 09:13    Microbiology: Recent Results (from the past 240 hour(s))  MRSA PCR Screening     Status: Abnormal   Collection Time: 09/24/16  5:27 AM  Result Value Ref Range Status  MRSA by PCR POSITIVE (A) NEGATIVE Final    Comment:        The GeneXpert MRSA Assay (FDA approved for NASAL specimens only), is one component of a comprehensive MRSA colonization surveillance program. It is not intended to diagnose MRSA infection nor to guide or monitor treatment for MRSA infections. RESULT CALLED TO, READ BACK BY AND VERIFIED WITH: RYAN,K @ 1416 ON 100217 BY POTEAT,S      Labs: Basic Metabolic Panel:  Recent Labs Lab 09/24/16 0045 09/24/16 0639 09/25/16 0539  NA 142 141 146*  K 4.5 4.2 3.8  CL 107 108 113*  CO2 29 26 28   GLUCOSE 133* 132* 124*  BUN 17 18 15   CREATININE 1.44* 1.30* 1.36*  CALCIUM 10.1 9.6 9.0   Liver Function Tests:  Recent Labs Lab  09/24/16 0045 09/24/16 0639  AST 21 24  ALT 8* 11*  ALKPHOS 106 102  BILITOT 0.6 0.8  PROT 7.9 7.5  ALBUMIN 3.9 3.9    Recent Labs Lab 09/24/16 0045  LIPASE 20   No results for input(s): AMMONIA in the last 168 hours. CBC:  Recent Labs Lab 09/24/16 0045 09/24/16 0639  WBC 9.3 8.4  NEUTROABS 6.4 6.8  HGB 14.1 13.7  HCT 40.7 40.7  MCV 91.1 92.5  PLT 186 191   Cardiac Enzymes: No results for input(s): CKTOTAL, CKMB, CKMBINDEX, TROPONINI in the last 168 hours. BNP: BNP (last 3 results) No results for input(s): BNP in the last 8760 hours.  ProBNP (last 3 results) No results for input(s): PROBNP in the last 8760 hours.  CBG:  Recent Labs Lab 09/24/16 1734 09/24/16 2357 09/25/16 1652 09/25/16 2348 09/26/16 0748  GLUCAP 114* 113* 150* 142* 137*       Signed:  Mialee Weyman MD, PhD  [facility] 09/26/2016, 4:26 PM

## 2016-09-27 NOTE — Consult Note (Signed)
Reason for Consult: SBO Referring Physician: Dr Ashley Shepherd is an 60 y.o. female.  HPI: 60 y.o. F with nausea and vomiting.  Pt reports being at [facility] for a similar situation.  She reports 2 days of nausea and some vomiting.  No fevers.  Takes imodium for chronic loose stools.  Has had prior colon surgery for colon cancer.   Past Medical History:  Diagnosis Date  . Anemia   . Anxiety   . Arthritis   . 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 Cancer ([place])    7 years ago, cancer free now  . Depression   . Difficulty sleeping   . GERD (gastroesophageal reflux disease)   . History of kidney stones   . History of transfusion   . HIV disease ([place])   . Kidney stones   . Pneumonia     Past Surgical History:  Procedure Laterality Date  . ANTERIOR HIP REVISION Left 04/06/2016   Procedure: REMOVAL OF TEMPORARY ANTIBIOTIC SPACER LEFT HIP, LEFT TOTAL HIP REVISION;  Surgeon: Mcarthur Rossetti, MD;  Location: [facility];  Service: Orthopedics;  Laterality: Left;  . APPENDECTOMY    . COLON SURGERY  2008  . COLOSTOMY REVERSAL  2013  . EXCISIONAL TOTAL HIP ARTHROPLASTY WITH ANTIBIOTIC SPACERS Left 01/24/2016   Procedure: EXCISION OF LEFT TOTAL HIP ARTHROPLASTY WITH PLACEMENT OF ANTIBIOTIC SPACERS;  Surgeon: Mcarthur Rossetti, MD;  Location: [facility];  Service: Orthopedics;  Laterality: Left;  . INCISION AND DRAINAGE HIP Left 01/24/2016   Procedure: IRRIGATION AND DEBRIDEMENT LEFT HIP;  Surgeon: Mcarthur Rossetti, MD;  Location: [place];  Service: Orthopedics;  Laterality: Left;  . KNEE ARTHROSCOPY     left  . PORT-A-CATH REMOVAL    . TOTAL HIP ARTHROPLASTY Left 10/01/2014   Procedure: LEFT TOTAL HIP ARTHROPLASTY ANTERIOR APPROACH;  Surgeon: Mcarthur Rossetti, MD;  Location: [facility];  Service: Orthopedics;  Laterality: Left;    Family History  Problem Relation Age of Onset  . Hypertension Father   . Diabetes Father   . Heart attack Father   . Hypertension  Other   . Diabetes Other     Social History:  reports that she has been smoking Cigarettes.  She has been smoking about 0.50 packs per day. She has never used smokeless tobacco. She reports that she does not drink alcohol or use drugs.  Allergies:  Allergies  Allergen Reactions  . Codeine Itching  . Ivp Dye [Iodinated Diagnostic Agents] Itching    Pt stated that she had NO problem/reaction to topical iodine/betadine solution.  . Sulfa Antibiotics Itching    Medications: I have reviewed the patient's current medications.  Results for orders placed or performed during the hospital encounter of 09/23/16 (from the past 48 hour(s))  Glucose, capillary     Status: Abnormal   Collection Time: 09/25/16  4:52 PM  Result Value Ref Range   Glucose-Capillary 150 (H) 65 - 99 mg/dL   Comment 1 Notify RN   Glucose, capillary     Status: Abnormal   Collection Time: 09/25/16 11:48 PM  Result Value Ref Range   Glucose-Capillary 142 (H) 65 - 99 mg/dL  Glucose, capillary     Status: Abnormal   Collection Time: 09/26/16  7:48 AM  Result Value Ref Range   Glucose-Capillary 137 (H) 65 - 99 mg/dL    No results found.  ROS Blood pressure (!) 149/76, pulse 60, temperature 98.3 F (  36.8 C), temperature source Oral, resp. rate 18, height 5' 10.5" (1.791 m), weight 90.3 kg (199 lb), SpO2 100 %. Physical Exam  Assessment/Plan: COnt NPO.  Insert NG and start small bowel protocol.     Ashley Shepherd C. [id number], [id number] AM

## 2017-07-23 ENCOUNTER — Emergency Department (HOSPITAL_BASED_OUTPATIENT_CLINIC_OR_DEPARTMENT_OTHER): Payer: Medicare Other

## 2017-07-23 ENCOUNTER — Inpatient Hospital Stay (HOSPITAL_BASED_OUTPATIENT_CLINIC_OR_DEPARTMENT_OTHER)
Admission: EM | Admit: 2017-07-23 | Discharge: 2017-08-05 | DRG: 969 | Disposition: A | Discharge status: Home Health | Payer: Medicare Other | Attending: Internal Medicine | Admitting: Internal Medicine

## 2017-07-23 ENCOUNTER — Encounter (HOSPITAL_BASED_OUTPATIENT_CLINIC_OR_DEPARTMENT_OTHER): Payer: Self-pay | Admitting: *Deleted

## 2017-07-23 DIAGNOSIS — Z8249 Family history of ischemic heart disease and other diseases of the circulatory system: Secondary | ICD-10-CM

## 2017-07-23 DIAGNOSIS — B20 Human immunodeficiency virus [HIV] disease: Secondary | ICD-10-CM | POA: Diagnosis not present

## 2017-07-23 DIAGNOSIS — L02416 Cutaneous abscess of left lower limb: Secondary | ICD-10-CM | POA: Diagnosis present

## 2017-07-23 DIAGNOSIS — A414 Sepsis due to anaerobes: Secondary | ICD-10-CM

## 2017-07-23 DIAGNOSIS — Z7982 Long term (current) use of aspirin: Secondary | ICD-10-CM

## 2017-07-23 DIAGNOSIS — N183 Chronic kidney disease, stage 3 unspecified: Secondary | ICD-10-CM | POA: Diagnosis present

## 2017-07-23 DIAGNOSIS — Z833 Family history of diabetes mellitus: Secondary | ICD-10-CM

## 2017-07-23 DIAGNOSIS — K219 Gastro-esophageal reflux disease without esophagitis: Secondary | ICD-10-CM | POA: Diagnosis present

## 2017-07-23 DIAGNOSIS — N10 Acute tubulo-interstitial nephritis: Secondary | ICD-10-CM | POA: Diagnosis present

## 2017-07-23 DIAGNOSIS — Y831 Surgical operation with implant of artificial internal device as the cause of abnormal reaction of the patient, or of later complication, without mention of misadventure at the time of the procedure: Secondary | ICD-10-CM | POA: Diagnosis present

## 2017-07-23 DIAGNOSIS — A4159 Other Gram-negative sepsis: Secondary | ICD-10-CM | POA: Diagnosis not present

## 2017-07-23 DIAGNOSIS — N12 Tubulo-interstitial nephritis, not specified as acute or chronic: Secondary | ICD-10-CM

## 2017-07-23 DIAGNOSIS — F418 Other specified anxiety disorders: Secondary | ICD-10-CM

## 2017-07-23 DIAGNOSIS — Z96642 Presence of left artificial hip joint: Secondary | ICD-10-CM

## 2017-07-23 DIAGNOSIS — Z96649 Presence of unspecified artificial hip joint: Secondary | ICD-10-CM

## 2017-07-23 DIAGNOSIS — R197 Diarrhea, unspecified: Secondary | ICD-10-CM | POA: Diagnosis present

## 2017-07-23 DIAGNOSIS — T8452XA Infection and inflammatory reaction due to internal left hip prosthesis, initial encounter: Secondary | ICD-10-CM | POA: Diagnosis present

## 2017-07-23 DIAGNOSIS — F17210 Nicotine dependence, cigarettes, uncomplicated: Secondary | ICD-10-CM | POA: Diagnosis present

## 2017-07-23 DIAGNOSIS — E876 Hypokalemia: Secondary | ICD-10-CM | POA: Diagnosis present

## 2017-07-23 DIAGNOSIS — Z79899 Other long term (current) drug therapy: Secondary | ICD-10-CM

## 2017-07-23 DIAGNOSIS — B961 Klebsiella pneumoniae [K. pneumoniae] as the cause of diseases classified elsewhere: Secondary | ICD-10-CM | POA: Diagnosis present

## 2017-07-23 DIAGNOSIS — Z22322 Carrier or suspected carrier of Methicillin resistant Staphylococcus aureus: Secondary | ICD-10-CM

## 2017-07-23 DIAGNOSIS — Z1611 Resistance to penicillins: Secondary | ICD-10-CM | POA: Diagnosis present

## 2017-07-23 DIAGNOSIS — Z419 Encounter for procedure for purposes other than remedying health state, unspecified: Secondary | ICD-10-CM

## 2017-07-23 DIAGNOSIS — N200 Calculus of kidney: Secondary | ICD-10-CM | POA: Diagnosis present

## 2017-07-23 DIAGNOSIS — E872 Acidosis: Secondary | ICD-10-CM | POA: Diagnosis present

## 2017-07-23 DIAGNOSIS — T8459XA Infection and inflammatory reaction due to other internal joint prosthesis, initial encounter: Secondary | ICD-10-CM

## 2017-07-23 LAB — URINALYSIS, ROUTINE W REFLEX MICROSCOPIC
Bilirubin Urine: NEGATIVE
GLUCOSE, UA: NEGATIVE mg/dL
Ketones, ur: NEGATIVE mg/dL
Nitrite: NEGATIVE
PH: 5.5 (ref 5.0–8.0)
Protein, ur: 30 mg/dL — AB
SPECIFIC GRAVITY, URINE: 1.018 (ref 1.005–1.030)

## 2017-07-23 LAB — COMPREHENSIVE METABOLIC PANEL
ALBUMIN: 3.6 g/dL (ref 3.5–5.0)
ALT: 8 U/L — ABNORMAL LOW (ref 14–54)
AST: 21 U/L (ref 15–41)
Alkaline Phosphatase: 108 U/L (ref 38–126)
Anion gap: 10 (ref 5–15)
BUN: 14 mg/dL (ref 6–20)
CHLORIDE: 101 mmol/L (ref 101–111)
CO2: 26 mmol/L (ref 22–32)
Calcium: 9.5 mg/dL (ref 8.9–10.3)
Creatinine, Ser: 1.48 mg/dL — ABNORMAL HIGH (ref 0.44–1.00)
GFR calc Af Amer: 43 mL/min — ABNORMAL LOW (ref >60)
GFR calc non Af Amer: 37 mL/min — ABNORMAL LOW (ref >60)
GLUCOSE: 148 mg/dL — AB (ref 65–99)
POTASSIUM: 3.6 mmol/L (ref 3.5–5.1)
SODIUM: 137 mmol/L (ref 135–145)
Total Bilirubin: 0.9 mg/dL (ref 0.3–1.2)
Total Protein: 7.7 g/dL (ref 6.5–8.1)

## 2017-07-23 LAB — PROTIME-INR
INR: 1.21
Prothrombin Time: 15.4 seconds — ABNORMAL HIGH (ref 11.4–15.2)

## 2017-07-23 LAB — CBC WITH DIFFERENTIAL/PLATELET
BASOS ABS: 0 10*3/uL (ref 0.0–0.1)
BASOS PCT: 0 %
EOS ABS: 0 10*3/uL (ref 0.0–0.7)
EOS PCT: 0 %
HEMATOCRIT: 38 % (ref 36.0–46.0)
Hemoglobin: 13.3 g/dL (ref 12.0–15.0)
LYMPHS PCT: 11 %
Lymphs Abs: 1.9 10*3/uL (ref 0.7–4.0)
MCH: 32.3 pg (ref 26.0–34.0)
MCHC: 35 g/dL (ref 30.0–36.0)
MCV: 92.2 fL (ref 78.0–100.0)
MONO ABS: 1.2 10*3/uL — AB (ref 0.1–1.0)
Monocytes Relative: 7 %
NEUTROS PCT: 82 %
Neutro Abs: 13.8 10*3/uL — ABNORMAL HIGH (ref 1.7–7.7)
PLATELETS: 154 10*3/uL (ref 150–400)
RBC: 4.12 MIL/uL (ref 3.87–5.11)
RDW: 14.9 % (ref 11.5–15.5)
WBC: 16.9 10*3/uL — AB (ref 4.0–10.5)

## 2017-07-23 LAB — URINALYSIS, MICROSCOPIC (REFLEX)

## 2017-07-23 LAB — I-STAT CG4 LACTIC ACID, ED
LACTIC ACID, VENOUS: 1.33 mmol/L (ref 0.5–1.9)
Lactic Acid, Venous: 2.02 mmol/L (ref 0.5–1.9)

## 2017-07-23 LAB — MRSA PCR SCREENING: MRSA BY PCR: POSITIVE — AB

## 2017-07-23 LAB — GLUCOSE, CAPILLARY: Glucose-Capillary: 124 mg/dL — ABNORMAL HIGH (ref 65–99)

## 2017-07-23 MED ORDER — ACETAMINOPHEN 650 MG RE SUPP: 650.0000 mg | Freq: Four times a day (QID) | RECTAL | Status: DC | PRN

## 2017-07-23 MED ORDER — SODIUM CHLORIDE 0.9 % IV BOLUS (SEPSIS)
1000.0000 mL | Freq: Once | INTRAVENOUS | Status: AC
  Administered 2017-07-23: 1000 mL via INTRAVENOUS

## 2017-07-23 MED ORDER — INSULIN ASPART 100 UNIT/ML ~~LOC~~ SOLN
0.0000 [IU] | Freq: Three times a day (TID) | SUBCUTANEOUS | Status: DC
  Administered 2017-07-24: 1 [IU] via SUBCUTANEOUS

## 2017-07-23 MED ORDER — FERROUS SULFATE 325 (65 FE) MG PO TABS
325.0000 mg | ORAL_TABLET | Freq: Two times a day (BID) | ORAL | Status: DC
  Administered 2017-07-23 – 2017-08-05 (×24): 325 mg via ORAL
  Filled 2017-07-23 (×25): qty 1

## 2017-07-23 MED ORDER — INSULIN ASPART 100 UNIT/ML ~~LOC~~ SOLN: 0.0000 [IU] | Freq: Every day | SUBCUTANEOUS | Status: DC

## 2017-07-23 MED ORDER — ALPRAZOLAM 1 MG PO TABS
1.0000 mg | ORAL_TABLET | Freq: Every evening | ORAL | Status: DC | PRN
  Administered 2017-07-24 – 2017-08-04 (×10): 1 mg via ORAL
  Filled 2017-07-23 (×10): qty 1

## 2017-07-23 MED ORDER — DICYCLOMINE HCL 10 MG PO CAPS: 10.0000 mg | ORAL_CAPSULE | Freq: Four times a day (QID) | ORAL | Status: DC | PRN

## 2017-07-23 MED ORDER — BISACODYL 5 MG PO TBEC: 5.0000 mg | DELAYED_RELEASE_TABLET | Freq: Every day | ORAL | Status: DC | PRN

## 2017-07-23 MED ORDER — SODIUM CHLORIDE 0.9 % IV SOLN
INTRAVENOUS | Status: AC
  Administered 2017-07-23: 21:00:00 via INTRAVENOUS

## 2017-07-23 MED ORDER — HEPARIN SODIUM (PORCINE) 5000 UNIT/ML IJ SOLN
5000.0000 [IU] | Freq: Three times a day (TID) | INTRAMUSCULAR | Status: DC
  Administered 2017-07-24 – 2017-08-05 (×31): 5000 [IU] via SUBCUTANEOUS
  Filled 2017-07-23 (×32): qty 1

## 2017-07-23 MED ORDER — ACETAMINOPHEN 325 MG PO TABS: 650.0000 mg | ORAL_TABLET | Freq: Once | ORAL | Status: DC

## 2017-07-23 MED ORDER — ONDANSETRON HCL 4 MG/2ML IJ SOLN: 4.0000 mg | Freq: Four times a day (QID) | INTRAMUSCULAR | Status: DC | PRN

## 2017-07-23 MED ORDER — PANTOPRAZOLE SODIUM 40 MG PO TBEC
40.0000 mg | DELAYED_RELEASE_TABLET | Freq: Every day | ORAL | Status: DC
  Administered 2017-07-24 – 2017-07-30 (×5): 40 mg via ORAL
  Filled 2017-07-23 (×11): qty 1

## 2017-07-23 MED ORDER — HYDROCODONE-ACETAMINOPHEN 5-325 MG PO TABS
1.0000 | ORAL_TABLET | ORAL | Status: DC | PRN
  Administered 2017-07-24 – 2017-08-05 (×22): 2 via ORAL
  Filled 2017-07-23 (×3): qty 2
  Filled 2017-07-23: qty 1
  Filled 2017-07-23 (×3): qty 2
  Filled 2017-07-23: qty 1
  Filled 2017-07-23 (×15): qty 2

## 2017-07-23 MED ORDER — MARAVIROC 300 MG PO TABS
300.0000 mg | ORAL_TABLET | Freq: Two times a day (BID) | ORAL | Status: DC
  Administered 2017-07-24 – 2017-08-05 (×23): 300 mg via ORAL
  Filled 2017-07-23 (×28): qty 1

## 2017-07-23 MED ORDER — LEVOFLOXACIN IN D5W 750 MG/150ML IV SOLN
750.0000 mg | Freq: Once | INTRAVENOUS | Status: AC
Start: 2017-07-23 — End: 2017-07-23
  Administered 2017-07-23: 750 mg via INTRAVENOUS
  Filled 2017-07-23: qty 150

## 2017-07-23 MED ORDER — ASPIRIN EC 81 MG PO TBEC
81.0000 mg | DELAYED_RELEASE_TABLET | Freq: Every day | ORAL | Status: DC
  Administered 2017-07-24 – 2017-08-05 (×12): 81 mg via ORAL
  Filled 2017-07-23 (×13): qty 1

## 2017-07-23 MED ORDER — ADULT MULTIVITAMIN W/MINERALS CH
1.0000 | ORAL_TABLET | Freq: Every day | ORAL | Status: DC
  Administered 2017-07-24 – 2017-08-05 (×12): 1 via ORAL
  Filled 2017-07-23 (×13): qty 1

## 2017-07-23 MED ORDER — EMTRICITABINE-TENOFOVIR AF 200-25 MG PO TABS
1.0000 | ORAL_TABLET | Freq: Every day | ORAL | Status: DC
  Administered 2017-07-24 – 2017-08-05 (×12): 1 via ORAL
  Filled 2017-07-23 (×13): qty 1

## 2017-07-23 MED ORDER — SENNOSIDES-DOCUSATE SODIUM 8.6-50 MG PO TABS: 1.0000 | ORAL_TABLET | Freq: Every evening | ORAL | Status: DC | PRN

## 2017-07-23 MED ORDER — CEFTRIAXONE SODIUM 1 G IJ SOLR
1.0000 g | INTRAMUSCULAR | Status: DC
  Administered 2017-07-23 – 2017-07-25 (×3): 1 g via INTRAVENOUS
  Filled 2017-07-23 (×3): qty 10

## 2017-07-23 MED ORDER — DOLUTEGRAVIR SODIUM 50 MG PO TABS
50.0000 mg | ORAL_TABLET | Freq: Two times a day (BID) | ORAL | Status: DC
Start: 2017-07-23 — End: 2017-08-05
  Administered 2017-07-23 – 2017-08-05 (×23): 50 mg via ORAL
  Filled 2017-07-23 (×28): qty 1

## 2017-07-23 MED ORDER — CITALOPRAM HYDROBROMIDE 20 MG PO TABS
20.0000 mg | ORAL_TABLET | Freq: Every morning | ORAL | Status: DC
  Administered 2017-07-24 – 2017-08-05 (×12): 20 mg via ORAL
  Filled 2017-07-23 (×13): qty 1

## 2017-07-23 MED ORDER — ACETAMINOPHEN 325 MG PO TABS
650.0000 mg | ORAL_TABLET | Freq: Four times a day (QID) | ORAL | Status: DC | PRN
Start: 2017-07-23 — End: 2017-07-25
  Administered 2017-07-24 (×3): 650 mg via ORAL
  Filled 2017-07-23 (×3): qty 2

## 2017-07-23 MED ORDER — ONDANSETRON HCL 4 MG PO TABS: 4.0000 mg | ORAL_TABLET | Freq: Four times a day (QID) | ORAL | Status: DC | PRN

## 2017-07-23 MED ORDER — ACETAMINOPHEN 500 MG PO TABS
1000.0000 mg | ORAL_TABLET | Freq: Once | ORAL | Status: AC
  Administered 2017-07-23: 1000 mg via ORAL
  Filled 2017-07-23: qty 2

## 2017-07-23 MED ORDER — IPRATROPIUM BROMIDE 0.02 % IN SOLN
RESPIRATORY_TRACT | Status: AC
  Filled 2017-07-23: qty 2.5

## 2017-07-23 NOTE — ED Notes (Signed)
ED Provider at bedside. 

## 2017-07-23 NOTE — H&P (Signed)
History and Physical    JAMILA SLATTEN EGB:[id number] DOB: [birth year] DOA: 07/23/2017  PCP: Marcia Brash, MD (Inactive)   Patient coming from: Home, by way of [facility]   Chief Complaint: Flank pain, dysuria, fevers  HPI: CALINA PATRIE is a 61 y.o. 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 Patient reports that she had been in her usual state of health when she noted the insidious development nonspecific malaise, followed by fevers, dysuria, and pain at the left flank described as constant, moderate to severe, localized, and without any alleviating or exacerbating factors identified. She denies any shortness of breath, but notes a mild nonproductive cough. She reports a mild transient headache associated with the fever that has since resolved. She denies any chest pain or palpitations. She denies any change in vision or hearing, and denies any focal numbness or weakness. She reports strict adherence with her HIV medications and has an upcoming appointment with ID as routine follow-up. Viral load has been consistently undetectable at her recent visits. CD4 was 478 in February 2018.  [facility] ED Course: Upon arrival to the [facility] ED, patient is found to be febrile to 39.2 C, saturating well on room air, and with vitals otherwise stable. Chest x-Janowski is negative for acute cardiopulmonary disease. Urinalysis is suggestive of infection and features hemoglobin on the dipstick. CBC is notable for a creatinine 1.48, up from 1.36 a year ago. CBC features a leukocytosis of 16,900. Lactic acid is minimally elevated to 2.02. Blood and urine cultures were collected in the ED, 1 L of normal saline was given, fever was treated with 1 g acetaminophen, and the patient was  started on antibiotics with Levaquin 750 mg IV. Fever improved with the Tylenol, patient remained hemodynamically stable and in no apparent respiratory distress, and will be observed in the hospital for ongoing evaluation and management of fevers, flank pain, and dysuria concerning for pyelonephritis.  Review of Systems:  All other systems reviewed and apart from HPI, are negative.  Past Medical History:  Diagnosis Date  . Anemia   . Anxiety   . Arthritis   . 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[place])    left  . Cancer ([place])    7 years ago, cancer free now  . Depression   . Difficulty sleeping   . GERD (gastroesophageal reflux disease)   . History of kidney stones   . History of transfusion   . HIV disease ([place])   . Kidney stones   . Pneumonia     Past Surgical History:  Procedure Laterality Date  . ANTERIOR HIP REVISION Left 04/06/2016   Procedure: REMOVAL OF TEMPORARY ANTIBIOTIC SPACER LEFT HIP, LEFT TOTAL HIP REVISION;  Surgeon: Mcarthur Rossetti, MD;  Location: [facility];  Service: Orthopedics;  Laterality: Left;  . APPENDECTOMY    . COLON SURGERY  2008  . COLOSTOMY REVERSAL  2013  . EXCISIONAL TOTAL HIP ARTHROPLASTY WITH ANTIBIOTIC SPACERS Left 01/24/2016   Procedure: EXCISION OF LEFT TOTAL HIP ARTHROPLASTY WITH PLACEMENT OF ANTIBIOTIC SPACERS;  Surgeon: Mcarthur Rossetti, MD;  Location: [place];  Service: Orthopedics;  Laterality: Left;  . INCISION AND DRAINAGE HIP Left 01/24/2016   Procedure: IRRIGATION AND DEBRIDEMENT LEFT HIP;  Surgeon: Mcarthur Rossetti, MD;  Location: [place];  Service: Orthopedics;  Laterality: Left;  . KNEE ARTHROSCOPY     left  . PORT-A-CATH REMOVAL    . TOTAL HIP ARTHROPLASTY Left 10/01/2014   Procedure: LEFT TOTAL HIP ARTHROPLASTY ANTERIOR APPROACH;  Surgeon: Mcarthur Rossetti, MD;  [facility]: [facility];  Service: Orthopedics;  Laterality: Left;     reports that she has been smoking Cigarettes.  She has been smoking about 0.50 packs per day. She  has never used smokeless tobacco. She reports that she does not drink alcohol or use drugs.  Allergies  Allergen Reactions  . Codeine Itching  . Ivp Dye [Iodinated Diagnostic Agents] Itching    Pt stated that she had NO problem/reaction to topical iodine/betadine solution.  . Sulfa Antibiotics Itching    Family History  Problem Relation Age of Onset  . Hypertension Father   . Diabetes Father   . Heart attack Father   . Hypertension Other   . Diabetes Other      Prior to Admission medications   Medication Sig Start Date End Date Taking? Authorizing Provider  ALPRAZolam Duanne Moron) 1 MG tablet Take 1 mg by mouth at bedtime as needed for anxiety.    [provider]  aspirin EC 81 MG tablet Take 81 mg by mouth daily.    [provider]  Calcium Citrate-Vitamin D (CALCIUM + D PO) Take 1 tablet by mouth daily.    [provider]  citalopram (CELEXA) 20 MG tablet Take 20 mg by mouth every morning.     [provider]  dicyclomine (BENTYL) 10 MG capsule Take 10 mg by mouth 4 (four) times daily as needed for spasms.     [provider]  dolutegravir (TIVICAY) 50 MG tablet Take 50 mg by mouth 2 (two) times daily.  05/02/15   [provider]  emtricitabine-tenofovir AF (DESCOVY) 200-25 MG tablet Take 1 tablet by mouth daily. 12/05/15   [provider]  ferrous sulfate 325 (65 FE) MG tablet Take 325 mg by mouth 2 (two) times daily.     [provider]  maraviroc (SELZENTRY) 300 MG tablet Take 300 mg by mouth 2 (two) times daily. Reported on 01/24/2016    [provider]  Multiple Vitamin (MULTIVITAMIN WITH MINERALS) TABS tablet Take 1 tablet by mouth daily.    [provider]  ondansetron (ZOFRAN) 4 MG tablet Take 1 tablet (4 mg total) by mouth every 6 (six) hours as needed for nausea or vomiting. 08/21/92   Delora Fuel, MD  pantoprazole (PROTONIX) 40 MG tablet Take 40 mg by mouth daily.    [provider]    Physical Exam: Vitals:   07/23/17 1600 07/23/17 1633 07/23/17 1700 07/23/17 1730  BP: 117/80  126/66 123/73  Pulse: 95  89 88  Resp: (!) 22  (!) 28 (!) 25  Temp:  99.4 F (37.4 C)    TempSrc:  Oral    SpO2: 96%  98% 100%  Weight:      Height:          Constitutional: NAD, calm, appears uncomfortable.  Eyes: PERTLA, lids and conjunctivae normal ENMT: Mucous membranes are moist. Posterior pharynx clear of any exudate or lesions.   Neck: normal, supple, no masses, no thyromegaly Respiratory: clear to auscultation bilaterally, no wheezing, no crackles. Normal respiratory effort.  Cardiovascular: S1 & S2 heard, regular rate and rhythm. No significant JVD. Abdomen: Suprapubic and left flank tenderness, no rebound pain or guarding. Bowel sounds active.  Musculoskeletal: no clubbing /  cyanosis. No joint deformity upper and lower extremities.   Skin: no significant rashes, lesions, ulcers. Warm, dry, well-perfused. Neurologic: CN 2-12 grossly intact. Sensation intact, DTR normal. Strength 5/5 in all 4 limbs.  Psychiatric: Alert and oriented x 3. Pleasant and cooperative.     Labs on Admission: I have personally reviewed following labs and imaging studies  CBC:  Recent Labs Lab 07/23/17 1320  WBC 16.9*  NEUTROABS 13.8*  HGB 13.3  HCT 38.0  MCV 92.2  PLT 726   Basic Metabolic Panel:  Recent Labs Lab 07/23/17 1320  NA 137  K 3.6  CL 101  CO2 26  GLUCOSE 148*  BUN 14  CREATININE 1.48*  CALCIUM 9.5   GFR: Estimated Creatinine Clearance: 49.1 mL/min (A) (by C-G formula based on SCr of 1.48 mg/dL (H)). Liver Function Tests:  Recent Labs Lab 07/23/17 1320  AST 21  ALT 8*  ALKPHOS 108  BILITOT 0.9  PROT 7.7  ALBUMIN 3.6   No results for input(s): LIPASE, AMYLASE in the last 168 hours. No results for input(s): AMMONIA in the last 168 hours. Coagulation Profile:  Recent Labs Lab 07/23/17 1320  INR 1.21   Cardiac Enzymes: No results for input(s):  CKTOTAL, CKMB, CKMBINDEX, TROPONINI in the last 168 hours. BNP (last 3 results) No results for input(s): PROBNP in the last 8760 hours. HbA1C: No results for input(s): HGBA1C in the last 72 hours. CBG: No results for input(s): GLUCAP in the last 168 hours. Lipid Profile: No results for input(s): CHOL, HDL, LDLCALC, TRIG, CHOLHDL, LDLDIRECT in the last 72 hours. Thyroid Function Tests: No results for input(s): TSH, T4TOTAL, FREET4, T3FREE, THYROIDAB in the last 72 hours. Anemia Panel: No results for input(s): VITAMINB12, FOLATE, FERRITIN, TIBC, IRON, RETICCTPCT in the last 72 hours. Urine analysis:    Component Value Date/Time   COLORURINE AMBER (A) 07/23/2017 1350   APPEARANCEUR CLOUDY (A) 07/23/2017 1350   LABSPEC 1.018 07/23/2017 1350   PHURINE 5.5 07/23/2017 1350   GLUCOSEU NEGATIVE 07/23/2017 1350   HGBUR SMALL (A) 07/23/2017 1350   BILIRUBINUR NEGATIVE 07/23/2017 1350   KETONESUR NEGATIVE 07/23/2017 1350   PROTEINUR 30 (A) 07/23/2017 1350   UROBILINOGEN 0.2 09/23/2014 1056   NITRITE NEGATIVE 07/23/2017 1350   LEUKOCYTESUR LARGE (A) 07/23/2017 1350   Sepsis Labs: @LABRCNTIP (procalcitonin:4,lacticidven:4) )No results found for this or any previous visit (from the past 240 hour(s)).   Radiological Exams on Admission: Dg Chest 2 View  Result Date: 07/23/2017 CLINICAL DATA:  Low-grade fever.  Cough. EXAM: CHEST  2 VIEW COMPARISON:  04/02/2017. FINDINGS: Mediastinum and hilar structures normal. Lungs are clear. No pleural effusion or pneumothorax. Heart size normal. Biapical pleural thickening noted consistent with scarring . IMPRESSION: No acute cardiopulmonary disease. Electronically Signed   By: Marcello Moores  Register   On: 07/23/2017 12:56    EKG: Not performed.   Assessment/Plan  1. Pyelonephritis  - Pt presents with 2-3 days malaise, fevers, dysuria, and left flank pain - She is found to be febrile with leukocytosis and UA consistent with infection - Initial lactate  minimally elevated, normalized with IVF  - Blood and urine cultures were collected, 1 liter NS given, and empiric Levaquin administered - Prior urine culture with multiple species - Plan to continue empiric abx with Rocephin, follow cultures, continue supportive care with IVF and antipyretics   2. HIV/AIDS  - Follows with ID at [facility], has appt this August for routine follow-up  - VL <20 and CD4 478 in February '18  -  Continue Descovy, Selzentry, Tivicay  3. CKD stage III  - SCr is 1.48 on admission, up slightly from 1.36 in 2017  - She is dry on admission in setting of recent anorexia and acute infection with fevers  - She was given 1 liter NS in ED and is continued on NS infusion   - Repeat chem panel in am   4. Depression, anxiety  - Stable  - Continue Celexa and prn Xanax   DVT prophylaxis: sq heparin Code Status: Full  Family Communication: Discussed with patient Disposition Plan: Observe on med-surg Consults called: None Admission status: Observation    Vianne Bulls, MD [facility] Pager [phone]  If 7PM-7AM, please contact night-coverage [email or web address] Password [email or web address]  07/23/2017, 7:09 PM

## 2017-07-23 NOTE — ED Provider Notes (Signed)
[facility] DEPT [facility] Provider Note   CSN: [id number] Arrival date & time: 07/23/17  1223     History   Chief Complaint Chief Complaint  Patient presents with  . Fever    HPI Ashley Shepherd is a 61 y.o. female.  Patient is a 61 year old female with history of HIV disease, anemia presenting with complaints of fever. She reports not feeling well for the past 2 days. Her fever yesterday evening was anywhere between 100.8 and 103.1. She denies any chest congestion or cough. She denies any vomiting or diarrhea. She does report some pain in her left flank.  She is followed by an infectious disease doctor with at [facility].   The history is provided by the patient.  Fever   This is a new problem. The current episode started 2 days ago. The problem occurs constantly. The problem has been gradually worsening. The maximum temperature noted was 102 to 102.9 F. Pertinent negatives include no vomiting, no headaches and no cough. She has tried nothing for the symptoms. The treatment provided no relief.    Past Medical History:  Diagnosis Date  . Anemia   . Anxiety   . Arthritis   . 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[place])    left  . Cancer ([place])    7 years ago, cancer free now  . Depression   . Difficulty sleeping   . GERD (gastroesophageal reflux disease)   . History of kidney stones   . History of transfusion   . HIV disease ([place])   . Kidney stones   . Pneumonia     Patient Active Problem List   Diagnosis Date Noted  . SBO (small bowel obstruction) ([place]) 09/24/2016  . Encounter for imaging study to confirm nasogastric (NG) tube placement   . Status post revision of total hip replacement 04/06/2016  . HIV disease ([place])   . CKD (chronic kidney disease) stage 3, GFR 30-59 ml/min   . 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 Avascular necrosis of bone of left hip ([place]) 10/01/2014  . Status post total  replacement of left hip 10/01/2014  . FB GI (foreign body in gastrointestinal tract) 04/09/2014    Past Surgical History:  Procedure Laterality Date  . ANTERIOR HIP REVISION Left 04/06/2016   Procedure: REMOVAL OF TEMPORARY ANTIBIOTIC SPACER LEFT HIP, LEFT TOTAL HIP REVISION;  Surgeon: Mcarthur Rossetti, MD;  Location: [facility];  Service: Orthopedics;  Laterality: Left;  . APPENDECTOMY    . COLON SURGERY  2008  . COLOSTOMY REVERSAL  2013  . EXCISIONAL TOTAL HIP ARTHROPLASTY WITH ANTIBIOTIC SPACERS Left 01/24/2016   Procedure: EXCISION OF LEFT TOTAL HIP ARTHROPLASTY WITH PLACEMENT OF ANTIBIOTIC SPACERS;  Surgeon: Mcarthur Rossetti, MD;  Location: [place];  Service: Orthopedics;  Laterality: Left;  . INCISION AND DRAINAGE HIP Left 01/24/2016   Procedure: IRRIGATION AND DEBRIDEMENT LEFT HIP;  Surgeon: Mcarthur Rossetti, MD;  Location: [place];  Service: Orthopedics;  Laterality: Left;  . KNEE ARTHROSCOPY     left  . PORT-A-CATH REMOVAL    . TOTAL HIP ARTHROPLASTY Left 10/01/2014   Procedure: LEFT TOTAL HIP ARTHROPLASTY ANTERIOR APPROACH;  Surgeon: Mcarthur Rossetti, MD;  [facility];  Service: Orthopedics;  Laterality: Left;    OB History    No data available       Home Medications    Prior to Admission medications   Medication Sig Start  Date End Date Taking? Authorizing Provider  ALPRAZolam Duanne Moron) 1 MG tablet Take 1 mg by mouth at bedtime as needed for anxiety.    [provider]  aspirin EC 81 MG tablet Take 81 mg by mouth daily.    [provider]  Calcium Citrate-Vitamin D (CALCIUM + D PO) Take 1 tablet by mouth daily.    [provider]  citalopram (CELEXA) 20 MG tablet Take 20 mg by mouth every morning.     [provider]  dicyclomine (BENTYL) 10 MG capsule Take 10 mg by mouth 4 (four) times daily as needed for spasms.     [provider]  dolutegravir (TIVICAY) 50 MG tablet Take 50 mg by mouth 2 (two) times daily.   05/02/15   [provider]  emtricitabine-tenofovir AF (DESCOVY) 200-25 MG tablet Take 1 tablet by mouth daily. 12/05/15   [provider]  ferrous sulfate 325 (65 FE) MG tablet Take 325 mg by mouth 2 (two) times daily.     [provider]  maraviroc (SELZENTRY) 300 MG tablet Take 300 mg by mouth 2 (two) times daily. Reported on 01/24/2016    [provider]  methocarbamol (ROBAXIN) 500 MG tablet Take 1 tablet (500 mg total) by mouth every 6 (six) hours as needed for muscle spasms. Patient not taking: Reported on 09/24/2016 04/09/16   Mcarthur Rossetti, MD  Multiple Vitamin (MULTIVITAMIN WITH MINERALS) TABS tablet Take 1 tablet by mouth daily.    [provider]  ondansetron (ZOFRAN) 4 MG tablet Take 1 tablet (4 mg total) by mouth every 6 (six) hours as needed for nausea or vomiting. 07/09/95   Delora Fuel, MD  pantoprazole (PROTONIX) 40 MG tablet Take 40 mg by mouth daily.    [provider]    Family History Family History  Problem Relation Age of Onset  . Hypertension Father   . Diabetes Father   . Heart attack Father   . Hypertension Other   . Diabetes Other     Social History Social History  Substance Use Topics  . Smoking status: Current Every Day Smoker    Packs/day: 0.50    Types: Cigarettes  . Smokeless tobacco: Never Used  . Alcohol use No     Allergies   Codeine; Ivp dye [iodinated diagnostic agents]; and Sulfa antibiotics   Review of Systems Review of Systems  Constitutional: Positive for fever.  Respiratory: Negative for cough.   Gastrointestinal: Negative for vomiting.  Neurological: Negative for headaches.  All other systems reviewed and are negative.    Physical Exam Updated Vital Signs BP 110/68   Pulse 97   Temp (!) 100.4 F (38 C) (Oral) Comment: she took Tylenol 2 hours ago  Resp (!) 22   Ht 5' 10.5" (1.791 m)   Wt 90.3 kg (199 lb)   SpO2 97%   BMI 28.15 kg/m   Physical Exam    Constitutional: She is oriented to person, place, and time. She appears well-developed and well-nourished. No distress.  HENT:  Head: Normocephalic and atraumatic.  Mouth/Throat: Oropharynx is clear and moist.  Eyes: Pupils are equal, round, and reactive to light.  Neck: Normal range of motion. Neck supple. No tracheal deviation present.  Cardiovascular: Normal rate and regular rhythm.  Exam reveals no gallop and no friction rub.   No murmur heard. Pulmonary/Chest: Effort normal and breath sounds normal. No stridor. No respiratory distress. She has no wheezes.  Abdominal: Soft. Bowel sounds are normal. She  exhibits no distension. There is no tenderness.  Musculoskeletal: Normal range of motion.  Lymphadenopathy:    She has no cervical adenopathy.  Neurological: She is alert and oriented to person, place, and time.  Skin: Skin is warm and dry. She is not diaphoretic.  Nursing note and vitals reviewed.    ED Treatments / Results  Labs (all labs ordered are listed, but only abnormal results are displayed) Labs Reviewed  I-STAT CG4 LACTIC ACID, ED - Abnormal; Notable for the following:       Result Value   Lactic Acid, Venous 2.02 (*)    All other components within normal limits  CULTURE, BLOOD (ROUTINE X 2)  CULTURE, BLOOD (ROUTINE X 2)  COMPREHENSIVE METABOLIC PANEL  CBC WITH DIFFERENTIAL/PLATELET  PROTIME-INR  URINALYSIS, ROUTINE W REFLEX MICROSCOPIC    EKG  EKG Interpretation None       Radiology Dg Chest 2 View  Result Date: 07/23/2017 CLINICAL DATA:  Low-grade fever.  Cough. EXAM: CHEST  2 VIEW COMPARISON:  04/02/2017. FINDINGS: Mediastinum and hilar structures normal. Lungs are clear. No pleural effusion or pneumothorax. Heart size normal. Biapical pleural thickening noted consistent with scarring . IMPRESSION: No acute cardiopulmonary disease. Electronically Signed   By: Marcello Moores  Register   On: 07/23/2017 12:56    Procedures Procedures (including critical care  time)  Medications Ordered in ED Medications - No data to display   Initial Impression / Assessment and Plan / ED Course  I have reviewed the triage vital signs and the nursing notes.  Pertinent labs & imaging results that were available during my care of the patient were reviewed by me and considered in my medical decision making (see chart for details).  Patient presents with fever, malaise, dysuria. Workup reveals a white count of 17, UTI on her urinalysis, and fever in the emergency department of 102.6. She will be given IV Levaquin and admitted to the hospitalist service under the care of Dr. Myna Hidalgo.  Final Clinical Impressions(s) / ED Diagnoses   Final diagnoses:  None    New Prescriptions New Prescriptions   No medications on file     Veryl Speak, MD 07/23/17 1524

## 2017-07-23 NOTE — ED Notes (Signed)
Pt on monitor 

## 2017-07-23 NOTE — ED Triage Notes (Signed)
Fever, sob, cough and body aches x 2 days.

## 2017-07-23 NOTE — ED Notes (Signed)
IV attempted x 2 by Malva Limes and x1 by Ivin Booty RN without success.

## 2017-07-24 DIAGNOSIS — A414 Sepsis due to anaerobes: Secondary | ICD-10-CM | POA: Diagnosis not present

## 2017-07-24 DIAGNOSIS — R197 Diarrhea, unspecified: Secondary | ICD-10-CM | POA: Diagnosis present

## 2017-07-24 DIAGNOSIS — Z8249 Family history of ischemic heart disease and other diseases of the circulatory system: Secondary | ICD-10-CM | POA: Diagnosis not present

## 2017-07-24 DIAGNOSIS — N12 Tubulo-interstitial nephritis, not specified as acute or chronic: Secondary | ICD-10-CM | POA: Diagnosis not present

## 2017-07-24 DIAGNOSIS — Z22322 Carrier or suspected carrier of Methicillin resistant Staphylococcus aureus: Secondary | ICD-10-CM | POA: Diagnosis not present

## 2017-07-24 DIAGNOSIS — E876 Hypokalemia: Secondary | ICD-10-CM | POA: Diagnosis not present

## 2017-07-24 DIAGNOSIS — T8459XS Infection and inflammatory reaction due to other internal joint prosthesis, sequela: Secondary | ICD-10-CM | POA: Diagnosis not present

## 2017-07-24 DIAGNOSIS — Z888 Allergy status to other drugs, medicaments and biological substances status: Secondary | ICD-10-CM | POA: Diagnosis not present

## 2017-07-24 DIAGNOSIS — Z79899 Other long term (current) drug therapy: Secondary | ICD-10-CM | POA: Diagnosis not present

## 2017-07-24 DIAGNOSIS — Z7982 Long term (current) use of aspirin: Secondary | ICD-10-CM | POA: Diagnosis not present

## 2017-07-24 DIAGNOSIS — Z833 Family history of diabetes mellitus: Secondary | ICD-10-CM | POA: Diagnosis not present

## 2017-07-24 DIAGNOSIS — T8459XA Infection and inflammatory reaction due to other internal joint prosthesis, initial encounter: Secondary | ICD-10-CM | POA: Diagnosis not present

## 2017-07-24 DIAGNOSIS — N183 Chronic kidney disease, stage 3 (moderate): Secondary | ICD-10-CM | POA: Diagnosis not present

## 2017-07-24 DIAGNOSIS — Z96649 Presence of unspecified artificial hip joint: Secondary | ICD-10-CM | POA: Diagnosis not present

## 2017-07-24 DIAGNOSIS — Z882 Allergy status to sulfonamides status: Secondary | ICD-10-CM | POA: Diagnosis not present

## 2017-07-24 DIAGNOSIS — T8452XA Infection and inflammatory reaction due to internal left hip prosthesis, initial encounter: Secondary | ICD-10-CM | POA: Diagnosis not present

## 2017-07-24 DIAGNOSIS — F17210 Nicotine dependence, cigarettes, uncomplicated: Secondary | ICD-10-CM | POA: Diagnosis not present

## 2017-07-24 DIAGNOSIS — Z1611 Resistance to penicillins: Secondary | ICD-10-CM | POA: Diagnosis present

## 2017-07-24 DIAGNOSIS — Z96642 Presence of left artificial hip joint: Secondary | ICD-10-CM | POA: Diagnosis not present

## 2017-07-24 DIAGNOSIS — Y831 Surgical operation with implant of artificial internal device as the cause of abnormal reaction of the patient, or of later complication, without mention of misadventure at the time of the procedure: Secondary | ICD-10-CM | POA: Diagnosis present

## 2017-07-24 DIAGNOSIS — B20 Human immunodeficiency virus [HIV] disease: Secondary | ICD-10-CM | POA: Diagnosis not present

## 2017-07-24 DIAGNOSIS — E872 Acidosis: Secondary | ICD-10-CM | POA: Diagnosis not present

## 2017-07-24 DIAGNOSIS — N10 Acute tubulo-interstitial nephritis: Secondary | ICD-10-CM | POA: Diagnosis not present

## 2017-07-24 DIAGNOSIS — T8451XA Infection and inflammatory reaction due to internal right hip prosthesis, initial encounter: Secondary | ICD-10-CM | POA: Diagnosis not present

## 2017-07-24 DIAGNOSIS — F418 Other specified anxiety disorders: Secondary | ICD-10-CM | POA: Diagnosis not present

## 2017-07-24 DIAGNOSIS — F341 Dysthymic disorder: Secondary | ICD-10-CM | POA: Diagnosis not present

## 2017-07-24 DIAGNOSIS — B961 Klebsiella pneumoniae [K. pneumoniae] as the cause of diseases classified elsewhere: Secondary | ICD-10-CM | POA: Diagnosis not present

## 2017-07-24 DIAGNOSIS — K219 Gastro-esophageal reflux disease without esophagitis: Secondary | ICD-10-CM | POA: Diagnosis present

## 2017-07-24 DIAGNOSIS — N200 Calculus of kidney: Secondary | ICD-10-CM | POA: Diagnosis not present

## 2017-07-24 DIAGNOSIS — L02416 Cutaneous abscess of left lower limb: Secondary | ICD-10-CM | POA: Diagnosis not present

## 2017-07-24 DIAGNOSIS — A4159 Other Gram-negative sepsis: Secondary | ICD-10-CM | POA: Diagnosis not present

## 2017-07-24 LAB — CBC WITH DIFFERENTIAL/PLATELET
BASOS ABS: 0 10*3/uL (ref 0.0–0.1)
Basophils Relative: 0 %
EOS ABS: 0 10*3/uL (ref 0.0–0.7)
Eosinophils Relative: 0 %
HCT: 35.6 % — ABNORMAL LOW (ref 36.0–46.0)
Hemoglobin: 12.5 g/dL (ref 12.0–15.0)
LYMPHS ABS: 2 10*3/uL (ref 0.7–4.0)
LYMPHS PCT: 13 %
MCH: 32.3 pg (ref 26.0–34.0)
MCHC: 35.1 g/dL (ref 30.0–36.0)
MCV: 92 fL (ref 78.0–100.0)
MONO ABS: 1.4 10*3/uL — AB (ref 0.1–1.0)
Monocytes Relative: 9 %
NEUTROS ABS: 12.2 10*3/uL — AB (ref 1.7–7.7)
Neutrophils Relative %: 78 %
PLATELETS: 144 10*3/uL — AB (ref 150–400)
RBC: 3.87 MIL/uL (ref 3.87–5.11)
RDW: 15.5 % (ref 11.5–15.5)
WBC: 15.6 10*3/uL — ABNORMAL HIGH (ref 4.0–10.5)

## 2017-07-24 LAB — BASIC METABOLIC PANEL
Anion gap: 11 (ref 5–15)
BUN: 12 mg/dL (ref 6–20)
CO2: 23 mmol/L (ref 22–32)
Calcium: 9 mg/dL (ref 8.9–10.3)
Chloride: 103 mmol/L (ref 101–111)
Creatinine, Ser: 1.41 mg/dL — ABNORMAL HIGH (ref 0.44–1.00)
GFR calc Af Amer: 46 mL/min — ABNORMAL LOW (ref >60)
GFR, EST NON AFRICAN AMERICAN: 39 mL/min — AB (ref >60)
GLUCOSE: 113 mg/dL — AB (ref 65–99)
POTASSIUM: 5.5 mmol/L — AB (ref 3.5–5.1)
Sodium: 137 mmol/L (ref 135–145)

## 2017-07-24 LAB — GLUCOSE, CAPILLARY
Glucose-Capillary: 133 mg/dL — ABNORMAL HIGH (ref 65–99)
Glucose-Capillary: 105 mg/dL — ABNORMAL HIGH (ref 65–99)
Glucose-Capillary: 128 mg/dL — ABNORMAL HIGH (ref 65–99)

## 2017-07-24 MED ORDER — SODIUM CHLORIDE 0.9 % IV SOLN
INTRAVENOUS | Status: AC
  Administered 2017-07-24 (×3): via INTRAVENOUS

## 2017-07-24 MED ORDER — SODIUM CHLORIDE 0.9 % IV BOLUS (SEPSIS)
500.0000 mL | Freq: Once | INTRAVENOUS | Status: AC
  Administered 2017-07-24: 500 mL via INTRAVENOUS

## 2017-07-24 MED ORDER — ACETAMINOPHEN 325 MG PO TABS: 325.0000 mg | ORAL_TABLET | Freq: Once | ORAL | Status: AC

## 2017-07-24 NOTE — Progress Notes (Signed)
Patient experiencing intermittent elevated temperature.  Her temperature is 102.9 with chills.  Fluid encouraged.  Norco 2 tabs given.  Hospitalist notified.

## 2017-07-24 NOTE — Progress Notes (Signed)
PROGRESS NOTE    CLARIECE Shepherd  NWG:[id number] DOB: 26-Mar-1956 DOA: 07/23/2017 PCP: Marcia Brash, MD (Inactive)   Brief Narrative:  HPI: Ashley Shepherd is a 61 y.o. 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  At [facility] ED, patient is found to be febrile to 39.2 C, Urinalysis is suggestive of infection and features hemoglobin on the dipstick. CBC is notable for a creatinine 1.48, up from 1.36 a year ago. CBC features a leukocytosis of 16,900. Lactic acid is minimally elevated to 2.02.  Assessment & Plan:   Principal Problem:   Pyelonephritis Active Problems:   HIV disease ([place])   CKD (chronic kidney disease) stage 3, GFR 30-59 ml/min   Depression with anxiety   Acute pyelonephritis  Pyelonephritis - patient meets sepsis criteria with T max- 103, leukocytosis- 16.9 . Mild lactic acidosis 2.02 >>1.3. Patient spiking fevers this afternoon. - Continue IV ceftriaxone for now, still too early to escalate therapy or consider imaging. - Blood cultures pending  - Urine cultures were collected - Continue IV normal saline for 1 day - tylenol  HIV/AIDS  - Follows with ID at [facility], has appt this August for routine follow-up  - VL <20 and CD4 478 in February '18  - Continue Descovy, Selzentry, Tivicay  CKD stage III - stable- SCr  ~1.4 on admission. Close to baseline 1.3- 1.4.  Depression, anxiety  - Stable  - Continue Celexa and prn Xanax  DVT prophylaxis: sq heparin Code Status: Full  Family Communication: Discussed with patient Disposition Plan: Observe on med-surg Consults called: None Admission status: Observation   Consultants:   None  Procedures: None   Antimicrobials:   IV Rocephin 7/31 >>  Subjective: Complaints of fever. Suprapubic pain. She states she is much  better compared to admission symptoms.   Objective: Vitals:   07/23/17 2001 07/24/17 0606 07/24/17 0617 07/24/17 1341  BP: 133/64  (!) 163/61 (!) 154/72  Pulse: 99  (!) 101 (!) 101  Resp: (!) 23  20 (!) 22  Temp: (!) 100.6 F (38.1 C) (!) 103.1 F (39.5 C)  (!) 101.3 F (38.5 C)  TempSrc: Oral Oral  Oral  SpO2: 100%  100% 100%  Weight:      Height:        Intake/Output Summary (Last 24 hours) at 07/24/17 1636 Last data filed at 07/24/17 0600  Gross per 24 hour  Intake             1980 ml  Output              350 ml  Net             1630 ml   Filed Weights   07/23/17 1234  Weight: 90.3 kg (199 lb)    Examination:  General exam: Appears calm and comfortable  Respiratory system: Clear to auscultation. Respiratory effort normal. Cardiovascular system: S1 & S2 heard, RRR. No JVD, murmurs, rubs, gallops or clicks. No pedal edema. Gastrointestinal system: Suprapubic tenderness. No organomegaly or masses felt. Normal bowel sounds heard. Central nervous system: Alert and oriented. No focal neurological deficits. Extremities: Symmetric 5 x 5 power. Skin: No rashes, lesions or ulcers Psychiatry: Judgement and insight appear normal. Mood & affect appropriate.   Data Reviewed: I have personally reviewed following labs and imaging studies  CBC:  Recent Labs Lab 07/23/17 1320 07/24/17 0604  WBC 16.9* 15.6*  NEUTROABS 13.8* 12.2*  HGB 13.3 12.5  HCT 38.0 35.6*  MCV 92.2 92.0  PLT 154 932*   Basic Metabolic Panel:  Recent Labs Lab 07/23/17 1320 07/24/17 0604  NA 137 137  K 3.6 5.5*  CL 101 103  CO2 26 23  GLUCOSE 148* 113*  BUN 14 12  CREATININE 1.48* 1.41*  CALCIUM 9.5 9.0   Liver Function Tests:  Recent Labs Lab 07/23/17 1320  AST 21  ALT 8*  ALKPHOS 108  BILITOT 0.9  PROT 7.7  ALBUMIN 3.6   Coagulation Profile:  Recent Labs Lab 07/23/17 1320  INR 1.21   CBG:  Recent Labs Lab 07/23/17 2124 07/24/17 0803 07/24/17 1214  GLUCAP 124* 133*  105*   Sepsis Labs:  Recent Labs Lab 07/23/17 1304 07/23/17 1545  LATICACIDVEN 2.02* 1.33    Recent Results (from the past 240 hour(s))  Culture, blood (Routine x 2)     Status: None (Preliminary result)   Collection Time: 07/23/17  1:20 PM  Result Value Ref Range Status   Specimen Description BLOOD BLOOD LEFT FOREARM  Final   Special Requests   Final    BOTTLES DRAWN AEROBIC AND ANAEROBIC Blood Culture results may not be optimal due to an inadequate volume of blood received in culture bottles   Culture   Final    NO GROWTH < 24 HOURS Performed at [facility], [place] [address], [place], [place] [address]    Report Status PENDING  Incomplete  Culture, Urine     Status: Abnormal (Preliminary result)   Collection Time: 07/23/17  1:50 PM  Result Value Ref Range Status   Specimen Description URINE, RANDOM  Final   Special Requests NONE  Final   Culture >=100,000 COLONIES/mL KLEBSIELLA PNEUMONIAE (A)  Final   Report Status PENDING  Incomplete  Culture, blood (Routine x 2)     Status: None (Preliminary result)   Collection Time: 07/23/17  2:10 PM  Result Value Ref Range Status   Specimen Description BLOOD RIGHT ANTECUBITAL  Final   Special Requests   Final    BOTTLES DRAWN AEROBIC AND ANAEROBIC Blood Culture adequate volume   Culture   Final    NO GROWTH < 24 HOURS Performed at [facility], [place] [address], [place], [place] [address]    Report Status PENDING  Incomplete  MRSA PCR Screening     Status: Abnormal   Collection Time: 07/23/17  6:48 PM  Result Value Ref Range Status   MRSA by PCR POSITIVE (A) NEGATIVE Final    Comment:        The GeneXpert MRSA Assay (FDA approved for NASAL specimens only), is one component of a comprehensive MRSA colonization surveillance program. It is not intended to diagnose MRSA infection nor to guide or monitor treatment for MRSA infections. RESULT CALLED TO, READ BACK BY AND VERIFIED WITH: Charleen Kirks [id number] @ [place]      Radiology Studies: Dg Chest 2 View  Result Date: 07/23/2017 CLINICAL DATA:  Low-grade fever.  Cough. EXAM: CHEST  2 VIEW COMPARISON:  04/02/2017. FINDINGS: Mediastinum and hilar structures normal. Lungs are clear. No pleural effusion or pneumothorax. Heart size normal. Biapical pleural thickening noted consistent with scarring . IMPRESSION: No acute cardiopulmonary disease. Electronically Signed   By: [place]   On: 07/23/2017 12:56   Scheduled Meds: . aspirin EC  81  mg Oral Daily  . citalopram  20 mg Oral q morning - 10a  . dolutegravir  50 mg Oral BID  . emtricitabine-tenofovir AF  1 tablet Oral Daily  . ferrous sulfate  325 mg Oral BID  . heparin  5,000 Units Subcutaneous Q8H  . insulin aspart  0-5 Units Subcutaneous QHS  . insulin aspart  0-9 Units Subcutaneous TID WC  . maraviroc  300 mg Oral BID  . multivitamin with minerals  1 tablet Oral Daily  . pantoprazole  40 mg Oral Daily   Continuous Infusions: . sodium chloride 100 mL/hr at 07/24/17 0845  . cefTRIAXone (ROCEPHIN)  IV Stopped (07/23/17 2111)     LOS: 0 days   Bethena Roys, MD [facility] Pager [phone]  If 7PM-7AM, please contact night-coverage [email or web address] Password [facility] 07/24/2017, 4:36 PM

## 2017-07-24 NOTE — Progress Notes (Signed)
Dr Denton Brick notified of temperature of 101.3.  Donne Hazel, RN

## 2017-07-25 ENCOUNTER — Inpatient Hospital Stay (HOSPITAL_COMMUNITY): Payer: Medicare Other

## 2017-07-25 LAB — BASIC METABOLIC PANEL
ANION GAP: 8 (ref 5–15)
BUN: 11 mg/dL (ref 6–20)
CALCIUM: 8.6 mg/dL — AB (ref 8.9–10.3)
CO2: 21 mmol/L — ABNORMAL LOW (ref 22–32)
CREATININE: 1.26 mg/dL — AB (ref 0.44–1.00)
Chloride: 109 mmol/L (ref 101–111)
GFR, EST AFRICAN AMERICAN: 52 mL/min — AB (ref >60)
GFR, EST NON AFRICAN AMERICAN: 45 mL/min — AB (ref >60)
GLUCOSE: 125 mg/dL — AB (ref 65–99)
Potassium: 3.6 mmol/L (ref 3.5–5.1)
Sodium: 138 mmol/L (ref 135–145)

## 2017-07-25 LAB — CBC
HEMATOCRIT: 32.1 % — AB (ref 36.0–46.0)
Hemoglobin: 11.2 g/dL — ABNORMAL LOW (ref 12.0–15.0)
MCH: 31.9 pg (ref 26.0–34.0)
MCHC: 34.9 g/dL (ref 30.0–36.0)
MCV: 91.5 fL (ref 78.0–100.0)
PLATELETS: 152 10*3/uL (ref 150–400)
RBC: 3.51 MIL/uL — AB (ref 3.87–5.11)
RDW: 15.4 % (ref 11.5–15.5)
WBC: 15.8 10*3/uL — AB (ref 4.0–10.5)

## 2017-07-25 LAB — URINE CULTURE

## 2017-07-25 LAB — HEMOGLOBIN A1C
Hgb A1c MFr Bld: 6 % — ABNORMAL HIGH (ref 4.8–5.6)
Mean Plasma Glucose: 126 mg/dL

## 2017-07-25 MED ORDER — CHLORHEXIDINE GLUCONATE CLOTH 2 % EX PADS
6.0000 | MEDICATED_PAD | Freq: Every day | CUTANEOUS | Status: AC
  Administered 2017-07-25 – 2017-07-27 (×3): 6 via TOPICAL

## 2017-07-25 MED ORDER — ACETAMINOPHEN 500 MG PO TABS
500.0000 mg | ORAL_TABLET | Freq: Four times a day (QID) | ORAL | Status: DC | PRN
  Administered 2017-07-25 – 2017-07-27 (×2): 500 mg via ORAL
  Filled 2017-07-25 (×2): qty 1

## 2017-07-25 MED ORDER — MUPIROCIN 2 % EX OINT
1.0000 "application " | TOPICAL_OINTMENT | Freq: Two times a day (BID) | CUTANEOUS | Status: AC
  Administered 2017-07-25 – 2017-07-29 (×9): 1 via NASAL
  Filled 2017-07-25 (×4): qty 22

## 2017-07-25 MED ORDER — LOPERAMIDE HCL 2 MG PO CAPS
2.0000 mg | ORAL_CAPSULE | Freq: Once | ORAL | Status: AC
  Administered 2017-07-25: 2 mg via ORAL
  Filled 2017-07-25: qty 1

## 2017-07-25 NOTE — Progress Notes (Signed)
Late entry. Gave pt CT contrast solution to drink. Notified CT when pt finished contrast solution. They scheduled her to be picked up. Pt and night nurse notified of plan.

## 2017-07-25 NOTE — Progress Notes (Signed)
Pt reports several large loose stools after returning from CT. Dr Maudie Mercury notified, orders received.

## 2017-07-25 NOTE — Progress Notes (Signed)
PROGRESS NOTE    Ashley Shepherd  CWC:[id number] DOB: Jul 21, 1956 DOA: 07/23/2017 PCP: Marcia Brash, MD (Inactive)   Brief Narrative:  HPI: Ashley Shepherd is a 61 y.o. 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  At [facility] ED, patient is found to be febrile to 39.2 C, Urinalysis is suggestive of infection and features hemoglobin on the dipstick. CBC is notable for a creatinine 1.48, up from 1.36 a year ago. CBC features a leukocytosis of 16,900. Lactic acid is minimally elevated to 2.02.  Assessment & Plan:   Principal Problem:   Pyelonephritis Active Problems:   HIV disease ([place])   CKD (chronic kidney disease) stage 3, GFR 30-59 ml/min   Depression with anxiety   Acute pyelonephritis  Pyelonephritis - patient meets sepsis criteria with T max- 103, leukocytosis- 16.9 . Mild lactic acidosis 2.02 >>1.3. Patient still spiking fevers-temp 102.8 this afternoon, persistent leukocytosis. - Blood cultures pending  - Urine cultures- klebsiella, sensitive to ceftriaxone - DC IV fluids - tylenol - Considering persistent fevers, with get CT abdomen and pelvis without contrast  HIV/AIDS  - Follows with ID at [facility], has appt this August for routine follow-up  - VL <20 and CD4 478 in February '18  - Continue Descovy, Selzentry, Tivicay  CKD stage III - stable- SCr  ~1.4>>1.2. Baseline 1.3- 1.4.  Depression, anxiety  - Stable  - Continue Celexa and prn Xanax  DVT prophylaxis: sq heparin Code Status: Full  Family Communication: Discussed with patient Disposition Plan: Observe on med-surg Consults called: None Admission status: Observation   Consultants:   None  Procedures: None   Antimicrobials:   IV Rocephin 7/31 >>  Subjective: Complaints of fever and chills. With some pain.  Overall says she feels better.  Objective: Vitals:   07/24/17 2005 07/24/17 2253 07/25/17 0609 07/25/17 1427  BP: (!) 172/79 133/63 (!) 141/80 (!) 148/68  Pulse: (!) 101 81 90 92  Resp: 20 18 17 20   Temp: (!) 102.9 F (39.4 C) 99.5 F (37.5 C) (!) 100.9 F (38.3 C) (!) 102.8 F (39.3 C)  TempSrc: Oral Oral Oral Oral  SpO2: 100% 99% 99% 99%  Weight:      Height:        Intake/Output Summary (Last 24 hours) at 07/25/17 1502 Last data filed at 07/25/17 1428  Gross per 24 hour  Intake             3435 ml  Output             2050 ml  Net             1385 ml   Filed Weights   07/23/17 1234  Weight: 90.3 kg (199 lb)    Examination:  General exam: Appears calm and comfortable  Respiratory system: Clear to auscultation. Respiratory effort normal. Cardiovascular system: S1 & S2 heard, RRR. No JVD, murmurs, rubs, gallops or clicks. No pedal edema. Gastrointestinal system: Suprapubic tenderness. No organomegaly or masses felt. Normal bowel sounds heard. Central nervous system: Alert and oriented. No focal neurological deficits. Extremities: Symmetric 5 x 5 power. Skin: No rashes, lesions or ulcers Psychiatry: Judgement and insight appear normal. Mood & affect appropriate.   Data Reviewed: I have personally reviewed following labs and imaging studies  CBC:  Recent Labs Lab 07/23/17 1320 07/24/17  0604 07/25/17 0534  WBC 16.9* 15.6* 15.8*  NEUTROABS 13.8* 12.2*  --   HGB 13.3 12.5 11.2*  HCT 38.0 35.6* 32.1*  MCV 92.2 92.0 91.5  PLT 154 144* 481   Basic Metabolic Panel:  Recent Labs Lab 07/23/17 1320 07/24/17 0604 07/25/17 0534  NA 137 137 138  K 3.6 5.5* 3.6  CL 101 103 109  CO2 26 23 21*  GLUCOSE 148* 113* 125*  BUN 14 12 11   CREATININE 1.48* 1.41* 1.26*  CALCIUM 9.5 9.0 8.6*   Liver Function Tests:  Recent Labs Lab 07/23/17 1320  AST 21  ALT 8*  ALKPHOS 108  BILITOT 0.9  PROT 7.7  ALBUMIN 3.6   Coagulation Profile:  Recent Labs Lab  07/23/17 1320  INR 1.21   CBG:  Recent Labs Lab 07/23/17 2124 07/24/17 0803 07/24/17 1214 07/24/17 1709  GLUCAP 124* 133* 105* 128*   Sepsis Labs:  Recent Labs Lab 07/23/17 1304 07/23/17 1545  LATICACIDVEN 2.02* 1.33    Recent Results (from the past 240 hour(s))  Culture, blood (Routine x 2)     Status: None (Preliminary result)   Collection Time: 07/23/17  1:20 PM  Result Value Ref Range Status   Specimen Description BLOOD BLOOD LEFT FOREARM  Final   Special Requests   Final    BOTTLES DRAWN AEROBIC AND ANAEROBIC Blood Culture results may not be optimal due to an inadequate volume of blood received in culture bottles   Culture   Final    NO GROWTH < 24 HOURS Performed at [facility], [place] [address] [place], [place] [address]    Report Status PENDING  Incomplete  Culture, Urine     Status: Abnormal   Collection Time: 07/23/17  1:50 PM  Result Value Ref Range Status   Specimen Description URINE, RANDOM  Final   Special Requests NONE  Final   Culture >=100,000 COLONIES/mL KLEBSIELLA PNEUMONIAE (A)  Final   Report Status 07/25/2017 FINAL  Final   Organism ID, Bacteria KLEBSIELLA PNEUMONIAE (A)  Final      Susceptibility   Klebsiella pneumoniae - MIC*    AMPICILLIN >=32 RESISTANT Resistant     CEFAZOLIN <=4 SENSITIVE Sensitive     CEFTRIAXONE <=1 SENSITIVE Sensitive     CIPROFLOXACIN <=0.25 SENSITIVE Sensitive     GENTAMICIN <=1 SENSITIVE Sensitive     IMIPENEM 0.5 SENSITIVE Sensitive     NITROFURANTOIN 128 RESISTANT Resistant     TRIMETH/SULFA <=20 SENSITIVE Sensitive     AMPICILLIN/SULBACTAM 4 SENSITIVE Sensitive     PIP/TAZO <=4 SENSITIVE Sensitive     Extended ESBL NEGATIVE Sensitive     * >=100,000 COLONIES/mL KLEBSIELLA PNEUMONIAE  Culture, blood (Routine x 2)     Status: None (Preliminary result)   Collection Time: 07/23/17  2:10 PM  Result Value Ref Range Status   Specimen Description BLOOD RIGHT ANTECUBITAL  Final   Special Requests   Final     BOTTLES DRAWN AEROBIC AND ANAEROBIC Blood Culture adequate volume   Culture   Final    NO GROWTH < 24 HOURS Performed at [facility], [address], [place], [place] [address]    Report Status PENDING  Incomplete  MRSA PCR Screening     Status: Abnormal   Collection Time: 07/23/17  6:48 PM  Result Value Ref Range Status   MRSA by PCR POSITIVE (A) NEGATIVE Final    Comment:        The GeneXpert MRSA Assay (  FDA approved for NASAL specimens only), is one component of a comprehensive MRSA colonization surveillance program. It is not intended to diagnose MRSA infection nor to guide or monitor treatment for MRSA infections. RESULT CALLED TO, READ BACK BY AND VERIFIED WITH: Charleen Kirks [id number] @ [place]      Radiology Studies: No results found. Scheduled Meds: . aspirin EC  81 mg Oral Daily  . Chlorhexidine Gluconate Cloth  6 each Topical Q0600  . citalopram  20 mg Oral q morning - 10a  . dolutegravir  50 mg Oral BID  . emtricitabine-tenofovir AF  1 tablet Oral Daily  . ferrous sulfate  325 mg Oral BID  . heparin  5,000 Units Subcutaneous Q8H  . maraviroc  300 mg Oral BID  . multivitamin with minerals  1 tablet Oral Daily  . mupirocin ointment  1 application Nasal BID  . pantoprazole  40 mg Oral Daily   Continuous Infusions: . cefTRIAXone (ROCEPHIN)  IV Stopped (07/24/17 2059)     LOS: 1 day   Bethena Roys, MD [facility] Pager [phone]  If 7PM-7AM, please contact night-coverage [email or web address] Password [email or web address] 07/25/2017, 3:02 PM

## 2017-07-25 NOTE — Progress Notes (Signed)
Pt's temp is 102.8 w/chills. Fluids encouraged. Will administer Norco 2 tabs. Dr. Denton Brick paged.

## 2017-07-26 ENCOUNTER — Inpatient Hospital Stay (HOSPITAL_COMMUNITY): Payer: Medicare Other

## 2017-07-26 LAB — CBC
HEMATOCRIT: 29.8 % — AB (ref 36.0–46.0)
Hemoglobin: 10.6 g/dL — ABNORMAL LOW (ref 12.0–15.0)
MCH: 32 pg (ref 26.0–34.0)
MCHC: 35.6 g/dL (ref 30.0–36.0)
MCV: 90 fL (ref 78.0–100.0)
Platelets: 172 10*3/uL (ref 150–400)
RBC: 3.31 MIL/uL — ABNORMAL LOW (ref 3.87–5.11)
RDW: 15.3 % (ref 11.5–15.5)
WBC: 17.3 10*3/uL — ABNORMAL HIGH (ref 4.0–10.5)

## 2017-07-26 MED ORDER — POTASSIUM CHLORIDE IN NACL 20-0.9 MEQ/L-% IV SOLN
INTRAVENOUS | Status: AC
  Administered 2017-07-26: 1000 mL via INTRAVENOUS
  Filled 2017-07-26: qty 1000

## 2017-07-26 MED ORDER — DEXTROSE 5 % IV SOLN
2.0000 g | INTRAVENOUS | Status: DC
  Administered 2017-07-26 – 2017-07-30 (×4): 2 g via INTRAVENOUS
  Filled 2017-07-26 (×6): qty 2

## 2017-07-26 NOTE — Progress Notes (Signed)
PROGRESS NOTE    Ashley Shepherd  TDH:[id number] DOB: [birth year] DOA: 07/23/2017 PCP: Marcia Brash, MD (Inactive)   Brief Narrative:  HPI: Ashley Shepherd is a 61 y.o. 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  At [facility] ED, patient is found to be febrile to 39.2 C, Urinalysis is suggestive of infection and features hemoglobin on the dipstick. CBC is notable for a creatinine 1.48, up from 1.36 a year ago. CBC features a leukocytosis of 16,900. Lactic acid is minimally elevated to 2.02.  Assessment & Plan:   Principal Problem:   Pyelonephritis Active Problems:   HIV disease ([place])   CKD (chronic kidney disease) stage 3, GFR 30-59 ml/min   Depression with anxiety   Acute pyelonephritis  Pyelonephritis - met sepsis criteria on amdission. Still Spiking fevers this afternoon 102.2. Expected fever curve to trend down by now. Today will complete 72 hrs on Appropriate IV antibiotics. Also with persistent leukocytosis. - Urine cultures- Klebsiella sensitive to ceftriazone - Cont IV ceftriazone 7/31 >>  Agree with pharm to increased dose to 2g today - Ct imaging yesterday for persistent high fevers after 48hrs on antibiotics, negative for peri-nephric Abcess, also fluid collection left prosthetic hip.  - Consulted Ortho, Dr. Marciano Sequin, recs appreciated- Based on her left hip exam now, she does not appear to have an infected hip.  Certainly if the concern persists of the hip being a source, then I would recommend a fluoro-guided left hip joint aspiration sending the aspirate for gram stain, culture, and cell count.  I would only recommend this is she develops significant left hip pain combined with continued fevers. - If still spiking fevers am- would consider repeat blood cultures and maybe ID phone  consult - Blood cultures NGTD - Urine cultures- klebsiella, sensitive to ceftriaxone - DC IV fluids - tylenol  HIV/AIDS  - Follows with ID at [facility], has appt this August for routine follow-up  - VL <20 and CD4 478 in February '18  - Continue [place]cov[place], [place], [place]  CKD stage III - stable- SCr  ~1.4>>1.2. Baseline 1.3- 1.4.  Depression, anxiety  - Stable  - Continue Celexa and prn Xanax  Diarrhea- With fevers and sweats and reports of multiple episodes of loose stools overnight - Will start IVF N/s+20 KCl for 10 hrs - BMP am - Stool for C. Diff, rule out etiology of fever.  DVT prophylaxis: sq heparin Code Status: Full  Family Communication: Discussed with patient Disposition Plan: Observe on med-surg Consults called: None Admission status: Observation   Consultants:   None  Procedures: None   Antimicrobials:   IV Rocephin 7/31 >>  Subjective: Still with chills and fever. No vomiting. Pt reported left hip pain that started with UTI symptoms just prior to admission, that made it difficult to walk.   Objective: Vitals:   07/26/17 0937 07/26/17 1232 07/26/17 1404 07/26/17 1755  BP:   118/65   Pulse:   70   Resp:   16   Temp: (!) 102.2 F (39 C) 98.3 F (36.8 C) 98.6 F (37 C) 100 F (37.8 C)  TempSrc: Oral Oral Oral Oral  SpO2:   100%   Weight:      Height:        Intake/Output Summary (Last 24 hours) at 07/26/17 1819 Last data filed  at 07/26/17 1406  Gross per 24 hour  Intake              530 ml  Output             1000 ml  Net             -470 ml   Filed Weights   07/23/17 1234  Weight: 90.3 kg (199 lb)    Examination:  General exam: Appears calm and comfortable, mildly diaphoretic. Respiratory system: Clear to auscultation. Respiratory effort normal. Cardiovascular system: S1 & S2 heard, RRR. No JVD, murmurs, rubs, gallops or clicks. No pedal edema. Gastrointestinal system: No Suprapubic tenderness. No organomegaly or masses felt.  Normal bowel sounds heard. Central nervous system: Alert and oriented. No focal neurological deficits. Extremities: Symmetric 5 x 5 power. Slight Wince on flexion of left hip, otherwise normal range of motion. Skin: No rashes, lesions or ulcers Psychiatry: Judgement and insight appear normal. Mood & affect appropriate.   Data Reviewed: I have personally reviewed following labs and imaging studies  CBC:  Recent Labs Lab 07/23/17 1320 07/24/17 0604 07/25/17 0534 07/26/17 0648  WBC 16.9* 15.6* 15.8* 17.3*  NEUTROABS 13.8* 12.2*  --   --   HGB 13.3 12.5 11.2* 10.6*  HCT 38.0 35.6* 32.1* 29.8*  MCV 92.2 92.0 91.5 90.0  PLT 154 144* 152 400   Basic Metabolic Panel:  Recent Labs Lab 07/23/17 1320 07/24/17 0604 07/25/17 0534  NA 137 137 138  K 3.6 5.5* 3.6  CL 101 103 109  CO2 26 23 21*  GLUCOSE 148* 113* 125*  BUN _0 CREATININE 1.48* 1.41* 1.26*  CALCIUM 9.5 9.0 8.6*   Liver Function Tests:  Recent Labs Lab 07/23/17 1320  AST 21  ALT 8*  ALKPHOS 108  BILITOT 0.9  PROT 7.7  ALBUMIN 3.6   Coagulation Profile:  Recent Labs Lab 07/23/17 1320  INR 1.21   CBG:  Recent Labs Lab 07/23/17 2124 07/24/17 0803 07/24/17 1214 07/24/17 1709  GLUCAP 124* 133* 105* 128*   Sepsis Labs:  Recent Labs Lab 07/23/17 1304 07/23/17 1545  LATICACIDVEN 2.02* 1.33    Recent Results (from the past 240 hour(s))  Culture, blood (Routine x 2)     Status: None (Preliminary result)   Collection Time: 07/23/17  1:20 PM  Result Value Ref Range Status   Specimen Description BLOOD BLOOD LEFT FOREARM  Final   Special Requests   Final    BOTTLES DRAWN AEROBIC AND ANAEROBIC Blood Culture results may not be optimal due to an inadequate volume of blood received in culture bottles   Culture   Final    NO GROWTH 3 DAYS Performed at [facility], [place] [address], [place], [place] [address]    Report Status PENDING  Incomplete  Culture, Urine     Status: Abnormal    Collection Time: 07/23/17  1:50 PM  Result Value Ref Range Status   Specimen Description URINE, RANDOM  Final   Special Requests NONE  Final   Culture >=100,000 COLONIES/mL KLEBSIELLA PNEUMONIAE (A)  Final   Report Status 07/25/2017 FINAL  Final   Organism ID, Bacteria KLEBSIELLA PNEUMONIAE (A)  Final      Susceptibility   Klebsiella pneumoniae - MIC*    AMPICILLIN >=32 RESISTANT Resistant     CEFAZOLIN <=4 SENSITIVE Sensitive     CEFTRIAXONE <=1 SENSITIVE Sensitive     CIPROFLOXACIN <=0.25 SENSITIVE Sensitive     GENTAMICIN <=1  SENSITIVE Sensitive     IMIPENEM 0.5 SENSITIVE Sensitive     NITROFURANTOIN 128 RESISTANT Resistant     TRIMETH/SULFA <=20 SENSITIVE Sensitive     AMPICILLIN/SULBACTAM 4 SENSITIVE Sensitive     PIP/TAZO <=4 SENSITIVE Sensitive     Extended ESBL NEGATIVE Sensitive     * >=100,000 COLONIES/mL KLEBSIELLA PNEUMONIAE  Culture, blood (Routine x 2)     Status: None (Preliminary result)   Collection Time: 07/23/17  2:10 PM  Result Value Ref Range Status   Specimen Description BLOOD RIGHT ANTECUBITAL  Final   Special Requests   Final    BOTTLES DRAWN AEROBIC AND ANAEROBIC Blood Culture adequate volume   Culture   Final    NO GROWTH 3 DAYS Performed at [facility], [address], [place], [place] [address]    Report Status PENDING  Incomplete  MRSA PCR Screening     Status: Abnormal   Collection Time: 07/23/17  6:48 PM  Result Value Ref Range Status   MRSA by PCR POSITIVE (A) NEGATIVE Final    Comment:        The GeneXpert MRSA Assay (FDA approved for NASAL specimens only), is one component of a comprehensive MRSA colonization surveillance program. It is not intended to diagnose MRSA infection nor to guide or monitor treatment for MRSA infections. RESULT CALLED TO, READ BACK BY AND VERIFIED WITH: Charleen Kirks [id number] @ [place]      Radiology Studies: Ct Abdomen Pelvis Wo Contrast  Result Date: 07/25/2017 CLINICAL DATA:   Abdominal pain, fever, HIV/AIDS EXAM: CT ABDOMEN AND PELVIS WITHOUT CONTRAST TECHNIQUE: Multidetector CT imaging of the abdomen and pelvis was performed following the standard protocol without IV contrast. COMPARISON:  09/24/2016 FINDINGS: Lower chest: Lung bases are essentially clear. Hepatobiliary: Unenhanced liver is unremarkable. Gallbladder is unremarkable. No intrahepatic or extrahepatic duct dilatation. Pancreas: Within normal limits. Spleen: Within normal limits. Adrenals/Urinary Tract: Adrenal glands are within normal limits. 3 mm nonobstructing left upper pole renal calculus (series 2/ image 20). Additional punctate nonobstructing bilateral upper pole renal calculi (series 2/ image 24). Mild bilateral hydronephrosis. Bladder is within normal limits, noting trace nondependent gas. Stomach/Bowel: Stomach is notable for a small hiatal hernia. No evidence of bowel obstruction. Appendix is not discretely visualized. Vascular/Lymphatic: No evidence of abdominal aortic aneurysm. No suspicious abdominopelvic lymphadenopathy. Reproductive: Uterus is within normal limits. No adnexal masses. Other: No abdominopelvic ascites. No evidence of intra-abdominal abscess. Musculoskeletal: Thoracolumbar spine is within normal limits. Avascular necrosis of the right femoral head. Left hip arthroplasty. Associated fluid collection anterior to the left hip prosthesis (series 2/ image 81), mildly progressive from 2017, with tiny foci of gas (series 2/image 70). IMPRESSION: No evidence of intra-abdominal abscess. Fluid anterior to the left hip prosthesis with tiny foci of gas, mildly progressive when compared to 2017. While sterility is indeterminate, if there are clinical findings supporting infection in this location, consider aspiration. Bilateral renal calculi, measuring up to 3 mm on the left. Mild bilateral hydronephrosis. Electronically Signed   By: Julian Hy M.D.   On: 07/25/2017 20:34   Dg Pelvis  Portable  Result Date: 07/26/2017 CLINICAL DATA:  61 y/o F; history of left total hip revision and left hip infection. Left hip pain started this morning. No injury or fever. EXAM: PORTABLE PELVIS 1-2 VIEWS COMPARISON:  07/25/2017 CT abdomen and pelvis. FINDINGS: Total left hip arthroplasty. No periprosthetic lucency or fracture is identified. The right hip joint space is maintained. Avascular necrosis  of the right femoral head. No acute fracture or diastases. IMPRESSION: 1. Total left hip arthroplasty. No periprosthetic lucency or fracture is identified. 2. Avascular necrosis of the right femoral head. 3. No acute fracture or pelvic diastases. Electronically Signed   By: Kristine Garbe M.D.   On: 07/26/2017 14:06   Scheduled Meds: . aspirin EC  81 mg Oral Daily  . Chlorhexidine Gluconate Cloth  6 each Topical Q0600  . citalopram  20 mg Oral q morning - 10a  . dolutegravir  50 mg Oral BID  . emtricitabine-tenofovir AF  1 tablet Oral Daily  . ferrous sulfate  325 mg Oral BID  . heparin  5,000 Units Subcutaneous Q8H  . maraviroc  300 mg Oral BID  . multivitamin with minerals  1 tablet Oral Daily  . mupirocin ointment  1 application Nasal BID  . pantoprazole  40 mg Oral Daily   Continuous Infusions: . cefTRIAXone (ROCEPHIN)  IV 2 g (07/26/17 1750)     LOS: 2 days   Bethena Roys, MD [facility] Pager [phone]  If 7PM-7AM, please contact night-coverage [email or web address] Password [facility] 07/26/2017, 6:19 PM

## 2017-07-26 NOTE — Progress Notes (Signed)
Patient reports weakness today. She also reports L hip pain has been worse than usual for the past week. MD present for this and aware. Ortho consulted. Patient reports that diarrhea has improved.

## 2017-07-26 NOTE — Progress Notes (Signed)
Patient ID: Ashley Shepherd, female   DOB: September 21, 1956, 61 y.o.   MRN: [id number] I have seen and examined [name].  She is very well-known to me.  She has a history of a left hip revision arthroplasty.  Her original hip was infected with a Strep strain and she did well after excision of her components, IV antibiotics, placement of an antibiotic spacer, and eventual re-implantation.  She was recently hospitalized for severe pyelonephritis and is on antibiotics.  She has had persistent fevers.  I was asked to come by to assess her left hip.  On exam, she actually gets out of bed easily and ambulates around the room with full weight bearing on her left hip.  She denies any hip pain today.  I can compress her left hip and put it thru full motion without discomfort.  The skin in intact around her hip and her previous incision without induration or redness.  X-rays of the left hip show a well-seated implant.  I did review the CT scan and see fluid that may be more consistent with post-operative changes.  Based on her left hip exam now, she does not appear to have an infected hip.  Certainly if the concern persists of the hip being a source, then I would recommend a fluoro-guided left hip joint aspiration sending the aspirate for gram stain, culture, and cell count.  I would only recommend this is she develops significant left hip pain combined with continued fevers.

## 2017-07-27 DIAGNOSIS — A414 Sepsis due to anaerobes: Secondary | ICD-10-CM

## 2017-07-27 DIAGNOSIS — A4159 Other Gram-negative sepsis: Secondary | ICD-10-CM

## 2017-07-27 DIAGNOSIS — F341 Dysthymic disorder: Secondary | ICD-10-CM

## 2017-07-27 LAB — CBC
HCT: 30.6 % — ABNORMAL LOW (ref 36.0–46.0)
HEMOGLOBIN: 11 g/dL — AB (ref 12.0–15.0)
MCH: 32.6 pg (ref 26.0–34.0)
MCHC: 35.9 g/dL (ref 30.0–36.0)
MCV: 90.8 fL (ref 78.0–100.0)
PLATELETS: 191 10*3/uL (ref 150–400)
RBC: 3.37 MIL/uL — ABNORMAL LOW (ref 3.87–5.11)
RDW: 15.3 % (ref 11.5–15.5)
WBC: 16.7 10*3/uL — ABNORMAL HIGH (ref 4.0–10.5)

## 2017-07-27 LAB — BASIC METABOLIC PANEL
Anion gap: 9 (ref 5–15)
BUN: 10 mg/dL (ref 6–20)
CALCIUM: 8.9 mg/dL (ref 8.9–10.3)
CO2: 24 mmol/L (ref 22–32)
CREATININE: 1.13 mg/dL — AB (ref 0.44–1.00)
Chloride: 108 mmol/L (ref 101–111)
GFR, EST AFRICAN AMERICAN: 60 mL/min — AB (ref >60)
GFR, EST NON AFRICAN AMERICAN: 51 mL/min — AB (ref >60)
Glucose, Bld: 108 mg/dL — ABNORMAL HIGH (ref 65–99)
Potassium: 3.9 mmol/L (ref 3.5–5.1)
SODIUM: 141 mmol/L (ref 135–145)

## 2017-07-27 NOTE — Progress Notes (Signed)
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  At [facility] ED, patient is found to be febrile to 39.2 C, Urinalysis is suggestive of infection and features hemoglobin on the dipstick. In the ED, CBC was notable for a creatinine 1.48, up from 1.36 a year ago. CBC features a leukocytosis of 16,900. Lactic acid is minimally elevated to 2.02.  Assessment/Plan: Sepsis  -present at time of admission -secondary to pyelonephritis -continue ceftriaxone -now afebrile x 24 hours -blood cultures neg to date  Pyelonephritis -07/25/2017 CT abdomen and pelvis--nonobstructive left upper pole renal calculus, additional nonobstructive bilateral upper pole calculi, mild bilateral hydronephrosis, fluid collection anterior to left hip prosthesis -Continue ceftriaxone -Urine cultures for Klebsiella -Blood cultures remain negative  Left hip fluid collection -Appreciate Ortho consult, Dr. Elesa Hacker fluid collection likely post op fluid collection -pt able to ambulate without pain and has full ROM with minimal to no pain -L-hip aspiration if develops left hip pain with persistent fever  HIV/AIDS - Follows with ID at [facility], has appt this August for routine follow-up  - VL <20 and CD4 478 in February '18  - Continue Descovy, Selzentry, Tivicay  CKD stage 3 -baseline creatinine 1.1-1.4  Depression, anxiety  - Stable  - Continue Celexa and prn Xanax  Diarrhea -no diarrhea x 48-72 hours   Disposition Plan:   Home 07/28/17 if afebrile x another 24 hours Family Communication:   No Family at bedside  Consultants:  Ortho--[place]  Code  Status:  FULL  DVT Prophylaxis:  [place] Heparin    Procedures: As Listed in Progress Note Above  Antibiotics: Ceftriaxone 7/31>>>    Subjective: Patient denies fevers, chills, headache, chest pain, dyspnea, nausea, vomiting, diarrhea, abdominal pain, dysuria, hematuria, hematochezia, and melena.   Objective: Vitals:   07/26/17 1404 07/26/17 1755 07/26/17 2107 07/27/17 0455  BP: 118/65  (!) 119/59 124/66  Pulse: 70  70 74  Resp: 16  18 16   Temp: 98.6 F (37 C) 100 F (37.8 C) 99 F (37.2 C) 100.1 F (37.8 C)  TempSrc: Oral Oral Oral Oral  SpO2: 100%  99% 99%  Weight:      Height:        Intake/Output Summary (Last 24 hours) at 07/27/17 0837 Last data filed at 07/27/17 0455  Gross per 24 hour  Intake          1373.33 ml  Output             2050 ml  Net          -676.67 ml   Weight change:  Exam:   General:  Pt is alert, follows commands appropriately, not in acute distress  HEENT: No icterus, No thrush, No neck mass, [place]/AT  Cardiovascular: RRR, S1/S2, no rubs, no gallops  Respiratory: CTA bilaterally, no wheezing, no crackles, no rhonchi  Abdomen: Soft/+BS, non tender, non distended, no guarding  Extremities: No edema, No lymphangitis, No petechiae, No rashes, no synovitis;  Left hip with no pain upon full active ROM   Data Reviewed: I have personally reviewed following labs and imaging studies Basic Metabolic Panel:  Recent Labs Lab 07/23/17 1320  07/24/17 0604 07/25/17 0534 07/27/17 0530  NA 137 137 138 141  K 3.6 5.5* 3.6 3.9  CL 101 103 109 108  CO2 26 23 21* 24  GLUCOSE 148* 113* 125* 108*  BUN 14 12 11 10   CREATININE 1.48* 1.41* 1.26* 1.13*  CALCIUM 9.5 9.0 8.6* 8.9   Liver Function Tests:  Recent Labs Lab 07/23/17 1320  AST 21  ALT 8*  ALKPHOS 108  BILITOT 0.9  PROT 7.7  ALBUMIN 3.6   No results for input(s): LIPASE, AMYLASE in the last 168 hours. No results for input(s): AMMONIA in the last 168 hours. Coagulation  Profile:  Recent Labs Lab 07/23/17 1320  INR 1.21   CBC:  Recent Labs Lab 07/23/17 1320 07/24/17 0604 07/25/17 0534 07/26/17 0648 07/27/17 0530  WBC 16.9* 15.6* 15.8* 17.3* 16.7*  NEUTROABS 13.8* 12.2*  --   --   --   HGB 13.3 12.5 11.2* 10.6* 11.0*  HCT 38.0 35.6* 32.1* 29.8* 30.6*  MCV 92.2 92.0 91.5 90.0 90.8  PLT 154 144* 152 172 191   Cardiac Enzymes: No results for input(s): CKTOTAL, CKMB, CKMBINDEX, TROPONINI in the last 168 hours. BNP: Invalid input(s): POCBNP CBG:  Recent Labs Lab 07/23/17 2124 07/24/17 0803 07/24/17 1214 07/24/17 1709  GLUCAP 124* 133* 105* 128*   HbA1C: No results for input(s): HGBA1C in the last 72 hours. Urine analysis:    Component Value Date/Time   COLORURINE AMBER (A) 07/23/2017 1350   APPEARANCEUR CLOUDY (A) 07/23/2017 1350   LABSPEC 1.018 07/23/2017 1350   PHURINE 5.5 07/23/2017 1350   GLUCOSEU NEGATIVE 07/23/2017 1350   HGBUR SMALL (A) 07/23/2017 1350   BILIRUBINUR NEGATIVE 07/23/2017 1350   KETONESUR NEGATIVE 07/23/2017 1350   PROTEINUR 30 (A) 07/23/2017 1350   UROBILINOGEN 0.2 09/23/2014 1056   NITRITE NEGATIVE 07/23/2017 1350   LEUKOCYTESUR LARGE (A) 07/23/2017 1350   Sepsis Labs: @LABRCNTIP (procalcitonin:4,lacticidven:4) ) Recent Results (from the past 240 hour(s))  Culture, blood (Routine x 2)     Status: None (Preliminary result)   Collection Time: 07/23/17  1:20 PM  Result Value Ref Range Status   Specimen Description BLOOD BLOOD LEFT FOREARM  Final   Special Requests   Final    BOTTLES DRAWN AEROBIC AND ANAEROBIC Blood Culture results may not be optimal due to an inadequate volume of blood received in culture bottles   Culture   Final    NO GROWTH 3 DAYS Performed at [facility], [place] [address] [place], [place] [address]    Report Status PENDING  Incomplete  Culture, Urine     Status: Abnormal   Collection Time: 07/23/17  1:50 PM  Result Value Ref Range Status   Specimen Description URINE,  RANDOM  Final   Special Requests NONE  Final   Culture >=100,000 COLONIES/mL KLEBSIELLA PNEUMONIAE (A)  Final   Report Status 07/25/2017 FINAL  Final   Organism ID, Bacteria KLEBSIELLA PNEUMONIAE (A)  Final      Susceptibility   Klebsiella pneumoniae - MIC*    AMPICILLIN >=32 RESISTANT Resistant     CEFAZOLIN <=4 SENSITIVE Sensitive     CEFTRIAXONE <=1 SENSITIVE Sensitive     CIPROFLOXACIN <=0.25 SENSITIVE Sensitive     GENTAMICIN <=1 SENSITIVE Sensitive     IMIPENEM 0.5 SENSITIVE Sensitive     NITROFURANTOIN 128 RESISTANT Resistant     TRIMETH/SULFA <=20 SENSITIVE Sensitive     AMPICILLIN/SULBACTAM 4 SENSITIVE Sensitive     PIP/TAZO <=4 SENSITIVE Sensitive  Extended ESBL NEGATIVE Sensitive     * >=100,000 COLONIES/mL KLEBSIELLA PNEUMONIAE  Culture, blood (Routine x 2)     Status: None (Preliminary result)   Collection Time: 07/23/17  2:10 PM  Result Value Ref Range Status   Specimen Description BLOOD RIGHT ANTECUBITAL  Final   Special Requests   Final    BOTTLES DRAWN AEROBIC AND ANAEROBIC Blood Culture adequate volume   Culture   Final    NO GROWTH 3 DAYS Performed at [facility], [address] [place], [place] [address]    Report Status PENDING  Incomplete  MRSA PCR Screening     Status: Abnormal   Collection Time: 07/23/17  6:48 PM  Result Value Ref Range Status   MRSA by PCR POSITIVE (A) NEGATIVE Final    Comment:        The GeneXpert MRSA Assay (FDA approved for NASAL specimens only), is one component of a comprehensive MRSA colonization surveillance program. It is not intended to diagnose MRSA infection nor to guide or monitor treatment for MRSA infections. RESULT CALLED TO, READ BACK BY AND VERIFIED WITH: Grubbs [id number] @ 6063 BY J SCOTTON      Scheduled Meds: . aspirin EC  81 mg Oral Daily  . Chlorhexidine Gluconate Cloth  6 each Topical Q0600  . citalopram  20 mg Oral q morning - 10a  . dolutegravir  50 mg Oral BID  .  emtricitabine-tenofovir AF  1 tablet Oral Daily  . ferrous sulfate  325 mg Oral BID  . heparin  5,000 Units Subcutaneous Q8H  . maraviroc  300 mg Oral BID  . multivitamin with minerals  1 tablet Oral Daily  . mupirocin ointment  1 application Nasal BID  . pantoprazole  40 mg Oral Daily   Continuous Infusions: . cefTRIAXone (ROCEPHIN)  IV 2 g (07/26/17 1750)    Procedures/Studies: Ct Abdomen Pelvis Wo Contrast  Result Date: 07/25/2017 CLINICAL DATA:  Abdominal pain, fever, HIV/AIDS EXAM: CT ABDOMEN AND PELVIS WITHOUT CONTRAST TECHNIQUE: Multidetector CT imaging of the abdomen and pelvis was performed following the standard protocol without IV contrast. COMPARISON:  09/24/2016 FINDINGS: Lower chest: Lung bases are essentially clear. Hepatobiliary: Unenhanced liver is unremarkable. Gallbladder is unremarkable. No intrahepatic or extrahepatic duct dilatation. Pancreas: Within normal limits. Spleen: Within normal limits. Adrenals/Urinary Tract: Adrenal glands are within normal limits. 3 mm nonobstructing left upper pole renal calculus (series 2/ image 20). Additional punctate nonobstructing bilateral upper pole renal calculi (series 2/ image 24). Mild bilateral hydronephrosis. Bladder is within normal limits, noting trace nondependent gas. Stomach/Bowel: Stomach is notable for a small hiatal hernia. No evidence of bowel obstruction. Appendix is not discretely visualized. Vascular/Lymphatic: No evidence of abdominal aortic aneurysm. No suspicious abdominopelvic lymphadenopathy. Reproductive: Uterus is within normal limits. No adnexal masses. Other: No abdominopelvic ascites. No evidence of intra-abdominal abscess. Musculoskeletal: Thoracolumbar spine is within normal limits. Avascular necrosis of the right femoral head. Left hip arthroplasty. Associated fluid collection anterior to the left hip prosthesis (series 2/ image 81), mildly progressive from 2017, with tiny foci of gas (series 2/image 70).  IMPRESSION: No evidence of intra-abdominal abscess. Fluid anterior to the left hip prosthesis with tiny foci of gas, mildly progressive when compared to 2017. While sterility is indeterminate, if there are clinical findings supporting infection in this location, consider aspiration. Bilateral renal calculi, measuring up to 3 mm on the left. Mild bilateral hydronephrosis. Electronically Signed   By: Julian Hy M.D.   On: 07/25/2017  20:34   Dg Chest 2 View  Result Date: 07/23/2017 CLINICAL DATA:  Low-grade fever.  Cough. EXAM: CHEST  2 VIEW COMPARISON:  04/02/2017. FINDINGS: Mediastinum and hilar structures normal. Lungs are clear. No pleural effusion or pneumothorax. Heart size normal. Biapical pleural thickening noted consistent with scarring . IMPRESSION: No acute cardiopulmonary disease. Electronically Signed   By: Marcello Moores  Register   On: 07/23/2017 12:56   Dg Pelvis Portable  Result Date: 07/26/2017 CLINICAL DATA:  61 y/o F; history of left total hip revision and left hip infection. Left hip pain started this morning. No injury or fever. EXAM: PORTABLE PELVIS 1-2 VIEWS COMPARISON:  07/25/2017 CT abdomen and pelvis. FINDINGS: Total left hip arthroplasty. No periprosthetic lucency or fracture is identified. The right hip joint space is maintained. Avascular necrosis of the right femoral head. No acute fracture or diastases. IMPRESSION: 1. Total left hip arthroplasty. No periprosthetic lucency or fracture is identified. 2. Avascular necrosis of the right femoral head. 3. No acute fracture or pelvic diastases. Electronically Signed   By: Kristine Garbe M.D.   On: 07/26/2017 14:06    Camaryn Lumbert, DO  [facility] Pager [phone]  If 7PM-7AM, please contact night-coverage [email or web address] Password [email or web address] 07/27/2017, 8:37 AM   LOS: 3 days

## 2017-07-28 LAB — CULTURE, BLOOD (ROUTINE X 2)
CULTURE: NO GROWTH
Culture: NO GROWTH
SPECIAL REQUESTS: ADEQUATE

## 2017-07-28 LAB — CBC
HEMATOCRIT: 31.1 % — AB (ref 36.0–46.0)
HEMOGLOBIN: 10.9 g/dL — AB (ref 12.0–15.0)
MCH: 31.4 pg (ref 26.0–34.0)
MCHC: 35 g/dL (ref 30.0–36.0)
MCV: 89.6 fL (ref 78.0–100.0)
Platelets: 220 10*3/uL (ref 150–400)
RBC: 3.47 MIL/uL — AB (ref 3.87–5.11)
RDW: 15.3 % (ref 11.5–15.5)
WBC: 14.9 10*3/uL — ABNORMAL HIGH (ref 4.0–10.5)

## 2017-07-28 NOTE — Progress Notes (Signed)
Pt's PVR 399 cc.  Notified provider on call. Obtained order to I and O cath pt PRN. I and out cathed 400 cc clear yellow urine. Will continue to monitor.

## 2017-07-28 NOTE — Progress Notes (Signed)
Recently bladder scanned as orders. Pt voided multiple times, most recently 500 ml, post residual void amount was 375 ml. Scanned several times with as little as 140 ml noted.

## 2017-07-28 NOTE — Consult Note (Signed)
Urology Consult   Physician requesting consult: Dr. Carles Collet  Reason for consult: Pyelonephritis, hydronephrosis  History of Present Illness: Ashley Shepherd is a 60 y.o. with HIV who developed fever about 1 weeks ago.  She describes left flank pain and also left hip pain since Monday.  She has a distant history of pyelonephritis as a younger woman and has had infrequency simple cystitis but no recurrent pyelonephritis over the past decade at least.  She was found to have a positive urine culture for Klebsiella consistent with left pyelonephritis.  However, her fever curve has not improved as expected with fevers still of 102.5 despite appropriate antibiotic therapy.  For this reason, she underwent a stone protocol CT scan that demonstrated bilateral mild hydronephrosis with some ureteral dilation, more prominent on the left down to the distal left ureter without any stones or other clear evidence of obstruction.  On review of prior CT scans, she has had multiple (not all) scan that have also demonstrated these findings suggesting possible vesicoureteral reflux.  She had previously seen Dr. Thomasene Mohair in [facility] who had a one point discussed a possible "transplant" and I wonder if this was a "ureteral reimplant" for vesicoureteral reflux.  Regardless, her hydronephrosis does not appear to be a new finding.  Other findings are non-obstructive renal calculi and a somewhat distended bladder. She subjectively feels she is emptying her bladder adequately but has been going more frequently.  She has a history of a prior septic left hip joint and has been previously treated by Dr. Katy Fitch who saw her on Friday.  She does have a small fluid collection over her left hip anteriorly.  She has had some tenderness in her hip with ambulation since last Monday.    Past Medical History:  Diagnosis Date  . Anemia   . Anxiety   . Arthritis   . 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 Cancer ([place])    7 years  ago, cancer free now  . Depression   . Difficulty sleeping   . GERD (gastroesophageal reflux disease)   . History of kidney stones   . History of transfusion   . HIV disease ([place])   . Kidney stones   . Pneumonia     Past Surgical History:  Procedure Laterality Date  . ANTERIOR HIP REVISION Left 04/06/2016   Procedure: REMOVAL OF TEMPORARY ANTIBIOTIC SPACER LEFT HIP, LEFT TOTAL HIP REVISION;  Surgeon: Mcarthur Rossetti, MD;  Location: [facility];  Service: Orthopedics;  Laterality: Left;  . APPENDECTOMY    . COLON SURGERY  2008  . COLOSTOMY REVERSAL  2013  . EXCISIONAL TOTAL HIP ARTHROPLASTY WITH ANTIBIOTIC SPACERS Left 01/24/2016   Procedure: EXCISION OF LEFT TOTAL HIP ARTHROPLASTY WITH PLACEMENT OF ANTIBIOTIC SPACERS;  Surgeon: Mcarthur Rossetti, MD;  Location: [facility];  Service: Orthopedics;  Laterality: Left;  . INCISION AND DRAINAGE HIP Left 01/24/2016   Procedure: IRRIGATION AND DEBRIDEMENT LEFT HIP;  Surgeon: Mcarthur Rossetti, MD;  Location: [facility];  Service: Orthopedics;  Laterality: Left;  . KNEE ARTHROSCOPY     left  . PORT-A-CATH REMOVAL    . TOTAL HIP ARTHROPLASTY Left 10/01/2014   Procedure: LEFT TOTAL HIP ARTHROPLASTY ANTERIOR APPROACH;  Surgeon: Mcarthur Rossetti, MD;  Location: [facility];  Service: Orthopedics;  Laterality: Left;     Current Hospital Medications:  Home meds:  Current Meds  Medication Sig  . ALPRAZolam (XANAX) 1 MG tablet Take 0.5 mg by  mouth at bedtime as needed for anxiety.   Marland Kitchen aspirin EC 81 MG tablet Take 81 mg by mouth daily.  . Calcium Citrate-Vitamin D (CALCIUM + D PO) Take 1 tablet by mouth daily.  . citalopram (CELEXA) 20 MG tablet Take 20 mg by mouth every morning.   . dolutegravir (TIVICAY) 50 MG tablet Take 50 mg by mouth 2 (two) times daily.   Marland Kitchen emtricitabine-tenofovir AF (DESCOVY) 200-25 MG tablet Take 1 tablet by mouth daily.  . ferrous sulfate 325 (65 FE) MG tablet Take 325 mg by mouth 2 (two) times daily.   . maraviroc  (SELZENTRY) 300 MG tablet Take 300 mg by mouth 2 (two) times daily. Reported on 01/24/2016  . Multiple Vitamin (MULTIVITAMIN WITH MINERALS) TABS tablet Take 1 tablet by mouth daily.    Scheduled Meds: . aspirin EC  81 mg Oral Daily  . Chlorhexidine Gluconate Cloth  6 each Topical Q0600  . citalopram  20 mg Oral q morning - 10a  . dolutegravir  50 mg Oral BID  . emtricitabine-tenofovir AF  1 tablet Oral Daily  . ferrous sulfate  325 mg Oral BID  . heparin  5,000 Units Subcutaneous Q8H  . maraviroc  300 mg Oral BID  . multivitamin with minerals  1 tablet Oral Daily  . mupirocin ointment  1 application Nasal BID  . pantoprazole  40 mg Oral Daily   Continuous Infusions: . cefTRIAXone (ROCEPHIN)  IV 2 g (07/26/17 1750)   PRN Meds:.[DISCONTINUED] acetaminophen **OR** acetaminophen, acetaminophen, ALPRAZolam, bisacodyl, dicyclomine, HYDROcodone-acetaminophen, ondansetron **OR** ondansetron (ZOFRAN) IV, senna-docusate  Allergies:  Allergies  Allergen Reactions  . Codeine Itching  . Ivp Dye [Iodinated Diagnostic Agents] Itching    Pt stated that she had NO problem/reaction to topical iodine/betadine solution.  . Sulfa Antibiotics Itching    Family History  Problem Relation Age of Onset  . Hypertension Father   . Diabetes Father   . Heart attack Father   . Hypertension Other   . Diabetes Other     Social History:  reports that she has been smoking Cigarettes.  She has been smoking about 0.50 packs per day. She has never used smokeless tobacco. She reports that she does not drink alcohol or use drugs.  ROS: A complete review of systems was performed.  All systems are negative except for pertinent findings as noted.  Physical Exam:  Vital signs in last 24 hours: Temp:  [99.1 F (37.3 C)-102.9 F (39.4 C)] 99.7 F (37.6 C) (08/05 0754) Pulse Rate:  [79-85] 79 (08/05 0645) Resp:  [16-18] 18 (08/05 0645) BP: (104-150)/(64-71) 104/66 (08/05 0645) SpO2:  [99 %-100 %] 100 % (08/05  0645) Constitutional:  Alert and oriented, No acute distress Cardiovascular: Regular rate and rhythm, No JVD Respiratory: Normal respiratory effort, Lungs clear bilaterally GI: Abdomen is soft, nontender, nondistended, no abdominal masses GU: Minimal left CVA tenderness MSK: She has tenderness over her anterior hip. Lymphatic: No lymphadenopathy Neurologic: Grossly intact, no focal deficits Psychiatric: Normal mood and affect  Laboratory Data:   Recent Labs  07/26/17 0648 07/27/17 0530 07/28/17 0445  WBC 17.3* 16.7* 14.9*  HGB 10.6* 11.0* 10.9*  HCT 29.8* 30.6* 31.1*  PLT 172 191 220     Recent Labs  07/27/17 0530  NA 141  K 3.9  CL 108  GLUCOSE 108*  BUN 10  CALCIUM 8.9  CREATININE 1.13*     Results for orders placed or performed during the hospital encounter of 07/23/17 (from the past 24  hour(s))  CBC     Status: Abnormal   Collection Time: 07/28/17  4:45 AM  Result Value Ref Range   WBC 14.9 (H) 4.0 - 10.5 K/uL   RBC 3.47 (L) 3.87 - 5.11 MIL/uL   Hemoglobin 10.9 (L) 12.0 - 15.0 g/dL   HCT 31.1 (L) 36.0 - 46.0 %   MCV 89.6 78.0 - 100.0 fL   MCH 31.4 26.0 - 34.0 pg   MCHC 35.0 30.0 - 36.0 g/dL   RDW 15.3 11.5 - 15.5 %   Platelets 220 150 - 400 K/uL   Recent Results (from the past 240 hour(s))  Culture, blood (Routine x 2)     Status: None   Collection Time: 07/23/17  1:20 PM  Result Value Ref Range Status   Specimen Description BLOOD BLOOD LEFT FOREARM  Final   Special Requests   Final    BOTTLES DRAWN AEROBIC AND ANAEROBIC Blood Culture results may not be optimal due to an inadequate volume of blood received in culture bottles   Culture   Final    NO GROWTH 5 DAYS Performed at [facility], [place] [address] [place], [place] [address]    Report Status 07/28/2017 FINAL  Final  Culture, Urine     Status: Abnormal   Collection Time: 07/23/17  1:50 PM  Result Value Ref Range Status   Specimen Description URINE, RANDOM  Final   Special Requests  NONE  Final   Culture >=100,000 COLONIES/mL KLEBSIELLA PNEUMONIAE (A)  Final   Report Status 07/25/2017 FINAL  Final   Organism ID, Bacteria KLEBSIELLA PNEUMONIAE (A)  Final      Susceptibility   Klebsiella pneumoniae - MIC*    AMPICILLIN >=32 RESISTANT Resistant     CEFAZOLIN <=4 SENSITIVE Sensitive     CEFTRIAXONE <=1 SENSITIVE Sensitive     CIPROFLOXACIN <=0.25 SENSITIVE Sensitive     GENTAMICIN <=1 SENSITIVE Sensitive     IMIPENEM 0.5 SENSITIVE Sensitive     NITROFURANTOIN 128 RESISTANT Resistant     TRIMETH/SULFA <=20 SENSITIVE Sensitive     AMPICILLIN/SULBACTAM 4 SENSITIVE Sensitive     PIP/TAZO <=4 SENSITIVE Sensitive     Extended ESBL NEGATIVE Sensitive     * >=100,000 COLONIES/mL KLEBSIELLA PNEUMONIAE  Culture, blood (Routine x 2)     Status: None   Collection Time: 07/23/17  2:10 PM  Result Value Ref Range Status   Specimen Description BLOOD RIGHT ANTECUBITAL  Final   Special Requests   Final    BOTTLES DRAWN AEROBIC AND ANAEROBIC Blood Culture adequate volume   Culture   Final    NO GROWTH 5 DAYS Performed at [facility], [address], [place], [place] [address]    Report Status 07/28/2017 FINAL  Final  MRSA PCR Screening     Status: Abnormal   Collection Time: 07/23/17  6:48 PM  Result Value Ref Range Status   MRSA by PCR POSITIVE (A) NEGATIVE Final    Comment:        The GeneXpert MRSA Assay (FDA approved for NASAL specimens only), is one component of a comprehensive MRSA colonization surveillance program. It is not intended to diagnose MRSA infection nor to guide or monitor treatment for MRSA infections. RESULT CALLED TO, READ BACK BY AND VERIFIED WITH: PAULINE PANPEAN,RN [id number] @ 1025 BY J SCOTTON     Renal Function:  Recent Labs  07/23/17 1320 07/24/17 0604 07/25/17 0534 07/27/17 0530  CREATININE 1.48* 1.41* 1.26* 1.13*   Estimated  Creatinine Clearance: 64.3 mL/min (A) (by C-G formula based on SCr of 1.13 mg/dL (H)).  Radiologic  Imaging: Dg Pelvis Portable  Result Date: 07/26/2017 CLINICAL DATA:  61 y/o F; history of left total hip revision and left hip infection. Left hip pain started this morning. No injury or fever. EXAM: PORTABLE PELVIS 1-2 VIEWS COMPARISON:  07/25/2017 CT abdomen and pelvis. FINDINGS: Total left hip arthroplasty. No periprosthetic lucency or fracture is identified. The right hip joint space is maintained. Avascular necrosis of the right femoral head. No acute fracture or diastases. IMPRESSION: 1. Total left hip arthroplasty. No periprosthetic lucency or fracture is identified. 2. Avascular necrosis of the right femoral head. 3. No acute fracture or pelvic diastases. Electronically Signed   By: Kristine Garbe M.D.   On: 07/26/2017 14:06    I independently reviewed the above imaging studies.  Impression/Recommendation: 1) Left pyelonephritis with persistent fever: Continue culture specific antibiotics for 10-14 days total.  She may not be resolving infection due to her immunosuppressed status.  I would defer to Dr. Rush Farmer regarding her hip although this could be a second focus of infection that may be a reason for her persistent fever as well.  2) Hydronephrosis: This is present bilaterally with more prominence on the left.  No evidence of obstruction based on CT and this is a chronic findings based on findings that have intermittently demonstrated similar findings on prior scans.  She may have vesicoureteral reflux.  Her bladder is full on her CT and this may make her hydronephrosis more noticeable.  Will check PVRs to ensure she is emptying adequately.   Will continue to follow.  Braidon Chermak,LES 07/28/2017, 1:08 PM  Pryor Curia. MD   CC: Dr. Carles Collet

## 2017-07-28 NOTE — Progress Notes (Signed)
Order by Dr. Carles Collet to have pt sign consent for IR guided aspiration of fluid in left hip. Pt reported that the procedure hadn't been explained to her and therefore felt she should not sign. I agreed. I told pt that her physician would answer any questions she had before the procedure and she could sign then if she agreed to the procedure. Pt in agreement.

## 2017-07-28 NOTE — Progress Notes (Signed)
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  At [facility] ED, patient is found to be febrile to 39.2 C, Urinalysis is suggestive of infection and features hemoglobin on the dipstick. In the ED, CBC was notable for a creatinine 1.48, up from 1.36 a year ago. CBC features a leukocytosis of 16,900. Lactic acid is minimally elevated to 2.02.  Assessment/Plan: Sepsis  -present at time of admission -secondary to pyelonephritis -continue ceftriaxone -still spiking fevers up to 102.9 -blood cultures neg to date  Pyelonephritis -07/25/2017 CT abdomen and pelvis--nonobstructive left upper pole renal calculus, additional nonobstructive bilateral upper pole calculi, mild bilateral hydronephrosis, fluid collection anterior to left hip prosthesis -Continue ceftriaxone -Urine cultures for Klebsiella -Blood cultures remain negative -appreciate urology consult--hydronephrosis appears to be chronic finding based upon being intermittently demonstrated similar findings on prior scans--may have vesicoureteral reflux -case discussed with Dr. Alinda Money  Left hip fluid collection -Appreciate Ortho consult, Dr. Elesa Hacker fluid collection likely post op fluid collection -pt able to ambulate without pain and has full ROM with minimal pain -L-hip aspiration if develops left hip pain with persistent fever -request IR left hip aspiration although fluid may be sterile after 5 days of IV abx  HIV/AIDS - Follows with ID at [facility], has appt this August for routine follow-up  - VL <20 and CD4 478 in February '18  - Continue [place], [place],  [place]  CKD stage 3 -baseline creatinine 1.1-1.4  Depression, anxiety  - Stable  - Continue Celexa and prn Xanax  Diarrhea -no diarrhea x 48-72 hours   Disposition Plan:  not stable for d/c--hip aspiration 8/6 Family Communication:   No Family at bedside  Consultants:  Ortho--Blackman; urology--Borden  Code Status:  FULL  DVT Prophylaxis:  [place] Heparin    Procedures: As Listed in Progress Note Above  Antibiotics: Ceftriaxone 7/31>>>    Subjective: Patient complains of some left hip pain with weightbearing although this is improved from several days prior. She denies any headaches, chest pain, shortness breath, coughing, nausea, vomiting, diarrhea, abdominal pain. She still complains of some left flank pain but this is improving.  Objective: Vitals:   07/27/17 2149 07/28/17 0645 07/28/17 0754 07/28/17 1504  BP: (!) 150/71 104/66  124/78  Pulse: 85 79  76  Resp: 18 18  18   Temp: (!) 102.9 F (39.4 C) 99.1 F (37.3 C) 99.7 F (37.6 C) 98.9 F (37.2 C)  TempSrc: Oral Oral Oral Oral  SpO2: 100% 100%  98%  Weight:      Height:        Intake/Output Summary (Last 24 hours) at 07/28/17 1516 Last data filed at 07/28/17 1505  Gross per 24 hour  Intake              840 ml  Output             4100 ml  Net            -3260 ml   Weight change:  Exam:   General:  Pt is alert, follows commands appropriately, not in acute distress  HEENT: No icterus, No thrush, No neck mass, [place]/AT  Cardiovascular: RRR, S1/S2, no rubs, no gallops  Respiratory: CTA bilaterally, no wheezing, no crackles, no rhonchi  Abdomen: Soft/+BS, non tender, non distended, no guarding  Extremities: No edema, No lymphangitis, No petechiae, No rashes, no synovitis   Data Reviewed: I have personally reviewed following labs and imaging studies Basic Metabolic Panel:  Recent Labs Lab 07/23/17 1320 07/24/17 0604 07/25/17 0534 07/27/17 0530  NA 137 137 138 141  K 3.6 5.5* 3.6  3.9  CL 101 103 109 108  CO2 26 23 21* 24  GLUCOSE 148* 113* 125* 108*  BUN 14 12 11 10   CREATININE 1.48* 1.41* 1.26* 1.13*  CALCIUM 9.5 9.0 8.6* 8.9   Liver Function Tests:  Recent Labs Lab 07/23/17 1320  AST 21  ALT 8*  ALKPHOS 108  BILITOT 0.9  PROT 7.7  ALBUMIN 3.6   No results for input(s): LIPASE, AMYLASE in the last 168 hours. No results for input(s): AMMONIA in the last 168 hours. Coagulation Profile:  Recent Labs Lab 07/23/17 1320  INR 1.21   CBC:  Recent Labs Lab 07/23/17 1320 07/24/17 0604 07/25/17 0534 07/26/17 0648 07/27/17 0530 07/28/17 0445  WBC 16.9* 15.6* 15.8* 17.3* 16.7* 14.9*  NEUTROABS 13.8* 12.2*  --   --   --   --   HGB 13.3 12.5 11.2* 10.6* 11.0* 10.9*  HCT 38.0 35.6* 32.1* 29.8* 30.6* 31.1*  MCV 92.2 92.0 91.5 90.0 90.8 89.6  PLT 154 144* 152 172 191 220   Cardiac Enzymes: No results for input(s): CKTOTAL, CKMB, CKMBINDEX, TROPONINI in the last 168 hours. BNP: Invalid input(s): POCBNP CBG:  Recent Labs Lab 07/23/17 2124 07/24/17 0803 07/24/17 1214 07/24/17 1709  GLUCAP 124* 133* 105* 128*   HbA1C: No results for input(s): HGBA1C in the last 72 hours. Urine analysis:    Component Value Date/Time   COLORURINE AMBER (A) 07/23/2017 1350   APPEARANCEUR CLOUDY (A) 07/23/2017 1350   LABSPEC 1.018 07/23/2017 1350   PHURINE 5.5 07/23/2017 1350   GLUCOSEU NEGATIVE 07/23/2017 1350   HGBUR SMALL (A) 07/23/2017 1350   BILIRUBINUR NEGATIVE 07/23/2017 1350   KETONESUR NEGATIVE 07/23/2017 1350   PROTEINUR 30 (A) 07/23/2017 1350   UROBILINOGEN 0.2 09/23/2014 1056   NITRITE NEGATIVE 07/23/2017 1350   LEUKOCYTESUR LARGE (A) 07/23/2017 1350   Sepsis Labs: @LABRCNTIP (procalcitonin:4,lacticidven:4) ) Recent Results (from the past 240 hour(s))  Culture, blood (Routine x 2)     Status: None   Collection Time: 07/23/17  1:20 PM  Result Value Ref Range Status   Specimen Description BLOOD BLOOD LEFT FOREARM  Final   Special Requests    Final    BOTTLES DRAWN AEROBIC AND ANAEROBIC Blood Culture results may not be optimal due to an inadequate volume of blood received in culture bottles   Culture   Final    NO GROWTH 5 DAYS Performed at [facility], [address] [address], [place], [place] [address]    Report Status 07/28/2017 FINAL  Final  Culture, Urine     Status: Abnormal   Collection Time: 07/23/17  1:50 PM  Result Value Ref Range Status   Specimen Description URINE, RANDOM  Final   Special Requests NONE  Final   Culture >=100,000 COLONIES/mL KLEBSIELLA PNEUMONIAE (A)  Final   Report Status 07/25/2017 FINAL  Final   Organism ID, Bacteria KLEBSIELLA PNEUMONIAE (A)  Final      Susceptibility   Klebsiella pneumoniae - MIC*    AMPICILLIN >=32 RESISTANT Resistant     CEFAZOLIN <=4  SENSITIVE Sensitive     CEFTRIAXONE <=1 SENSITIVE Sensitive     CIPROFLOXACIN <=0.25 SENSITIVE Sensitive     GENTAMICIN <=1 SENSITIVE Sensitive     IMIPENEM 0.5 SENSITIVE Sensitive     NITROFURANTOIN 128 RESISTANT Resistant     TRIMETH/SULFA <=20 SENSITIVE Sensitive     AMPICILLIN/SULBACTAM 4 SENSITIVE Sensitive     PIP/TAZO <=4 SENSITIVE Sensitive     Extended ESBL NEGATIVE Sensitive     * >=100,000 COLONIES/mL KLEBSIELLA PNEUMONIAE  Culture, blood (Routine x 2)     Status: None   Collection Time: 07/23/17  2:10 PM  Result Value Ref Range Status   Specimen Description BLOOD RIGHT ANTECUBITAL  Final   Special Requests   Final    BOTTLES DRAWN AEROBIC AND ANAEROBIC Blood Culture adequate volume   Culture   Final    NO GROWTH 5 DAYS Performed at [facility], [place] [address], [place],  [address]    Report Status 07/28/2017 FINAL  Final  MRSA PCR Screening     Status: Abnormal   Collection Time: 07/23/17  6:48 PM  Result Value Ref Range Status   MRSA by PCR POSITIVE (A) NEGATIVE Final    Comment:        The GeneXpert MRSA Assay (FDA approved for NASAL specimens only), is one component of a comprehensive MRSA  colonization surveillance program. It is not intended to diagnose MRSA infection nor to guide or monitor treatment for MRSA infections. RESULT CALLED TO, READ BACK BY AND VERIFIED WITH: [place] [id number] @ [phone] BY J SCOTTON      Scheduled Meds: . aspirin EC  81 mg Oral Daily  . Chlorhexidine Gluconate Cloth  6 each Topical Q0600  . citalopram  20 mg Oral q morning - 10a  . dolutegravir  50 mg Oral BID  . emtricitabine-tenofovir AF  1 tablet Oral Daily  . ferrous sulfate  325 mg Oral BID  . heparin  5,000 Units Subcutaneous Q8H  . maraviroc  300 mg Oral BID  . multivitamin with minerals  1 tablet Oral Daily  . mupirocin ointment  1 application Nasal BID  . pantoprazole  40 mg Oral Daily   Continuous Infusions: . cefTRIAXone (ROCEPHIN)  IV 2 g (07/26/17 1750)    Procedures/Studies: Ct Abdomen Pelvis Wo Contrast  Result Date: 07/25/2017 CLINICAL DATA:  Abdominal pain, fever, HIV/AIDS EXAM: CT ABDOMEN AND PELVIS WITHOUT CONTRAST TECHNIQUE: Multidetector CT imaging of the abdomen and pelvis was performed following the standard protocol without IV contrast. COMPARISON:  09/24/2016 FINDINGS: Lower chest: Lung bases are essentially clear. Hepatobiliary: Unenhanced liver is unremarkable. Gallbladder is unremarkable. No intrahepatic or extrahepatic duct dilatation. Pancreas: Within normal limits. Spleen: Within normal limits. Adrenals/Urinary Tract: Adrenal glands are within normal limits. 3 mm nonobstructing left upper pole renal calculus (series 2/ image 20). Additional punctate nonobstructing bilateral upper pole renal calculi (series 2/ image 24). Mild bilateral hydronephrosis. Bladder is within normal limits, noting trace nondependent gas. Stomach/Bowel: Stomach is notable for a small hiatal hernia. No evidence of bowel obstruction. Appendix is not discretely visualized. Vascular/Lymphatic: No evidence of abdominal aortic aneurysm. No suspicious abdominopelvic lymphadenopathy.  Reproductive: Uterus is within normal limits. No adnexal masses. Other: No abdominopelvic ascites. No evidence of intra-abdominal abscess. Musculoskeletal: Thoracolumbar spine is within normal limits. Avascular necrosis of the right femoral head. Left hip arthroplasty. Associated fluid collection anterior to the left hip prosthesis (series 2/ image 81), mildly progressive from 2017, with tiny foci of gas (series  2/image 70). IMPRESSION: No evidence of intra-abdominal abscess. Fluid anterior to the left hip prosthesis with tiny foci of gas, mildly progressive when compared to 2017. While sterility is indeterminate, if there are clinical findings supporting infection in this location, consider aspiration. Bilateral renal calculi, measuring up to 3 mm on the left. Mild bilateral hydronephrosis. Electronically Signed   By: Julian Hy M.D.   On: 07/25/2017 20:34   Dg Chest 2 View  Result Date: 07/23/2017 CLINICAL DATA:  Low-grade fever.  Cough. EXAM: CHEST  2 VIEW COMPARISON:  04/02/2017. FINDINGS: Mediastinum and hilar structures normal. Lungs are clear. No pleural effusion or pneumothorax. Heart size normal. Biapical pleural thickening noted consistent with scarring . IMPRESSION: No acute cardiopulmonary disease. Electronically Signed   By: Marcello Moores  Register   On: 07/23/2017 12:56   Dg Pelvis Portable  Result Date: 07/26/2017 CLINICAL DATA:  61 y/o F; history of left total hip revision and left hip infection. Left hip pain started this morning. No injury or fever. EXAM: PORTABLE PELVIS 1-2 VIEWS COMPARISON:  07/25/2017 CT abdomen and pelvis. FINDINGS: Total left hip arthroplasty. No periprosthetic lucency or fracture is identified. The right hip joint space is maintained. Avascular necrosis of the right femoral head. No acute fracture or diastases. IMPRESSION: 1. Total left hip arthroplasty. No periprosthetic lucency or fracture is identified. 2. Avascular necrosis of the right femoral head. 3. No acute  fracture or pelvic diastases. Electronically Signed   By: Kristine Garbe M.D.   On: 07/26/2017 14:06    Sarah-Jane Nazario, DO  [facility] Pager [phone]  If 7PM-7AM, please contact night-coverage [email or web address] Password [email or web address] 07/28/2017, 3:16 PM   LOS: 4 days

## 2017-07-29 ENCOUNTER — Inpatient Hospital Stay (HOSPITAL_COMMUNITY): Payer: Medicare Other

## 2017-07-29 LAB — CBC
HEMATOCRIT: 30.7 % — AB (ref 36.0–46.0)
HEMOGLOBIN: 10.8 g/dL — AB (ref 12.0–15.0)
MCH: 32 pg (ref 26.0–34.0)
MCHC: 35.2 g/dL (ref 30.0–36.0)
MCV: 91.1 fL (ref 78.0–100.0)
Platelets: 309 10*3/uL (ref 150–400)
RBC: 3.37 MIL/uL — AB (ref 3.87–5.11)
RDW: 15.2 % (ref 11.5–15.5)
WBC: 17.7 10*3/uL — AB (ref 4.0–10.5)

## 2017-07-29 LAB — SYNOVIAL CELL COUNT + DIFF, W/ CRYSTALS
Crystals, Fluid: NONE SEEN
LYMPHOCYTES-SYNOVIAL FLD: 4 % (ref 0–20)
MONOCYTE-MACROPHAGE-SYNOVIAL FLUID: 19 % — AB (ref 50–90)
NEUTROPHIL, SYNOVIAL: 77 % — AB (ref 0–25)
WBC, Synovial: UNDETERMINED /mm3 (ref 0–200)

## 2017-07-29 MED ORDER — LIDOCAINE HCL 1 % IJ SOLN
INTRAMUSCULAR | Status: AC
Start: 2017-07-29 — End: 2017-07-29
  Administered 2017-07-29: 10 mL
  Filled 2017-07-29: qty 20

## 2017-07-29 NOTE — Progress Notes (Signed)
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  At [facility] ED, patient is found to be febrile to 39.2 C, Urinalysis is suggestive of infection and features hemoglobin on the dipstick. In the ED, CBC was notable for a creatinine 1.48, up from 1.36 a year ago. CBC features a leukocytosis of 16,900. Lactic acid is minimally elevated to 2.02.  Assessment/Plan: Sepsis  -present at time of admission -secondary to pyelonephritis -continue ceftriaxone -still spiking fevers-->IR for aspiration of left hip fluid collection -blood cultures neg to date  Pyelonephritis -07/25/2017 CT abdomen and pelvis--nonobstructive left upper pole renal calculus, additional nonobstructive bilateral upper pole calculi, mild bilateral hydronephrosis, fluid collection anterior to left hip prosthesis -Continue ceftriaxone -Urine cultures for Klebsiella -Blood cultures remain negative -appreciate urology consult--hydronephrosis appears to be chronic finding based upon being intermittently demonstrated similar findings on prior scans--may have vesicoureteral reflux -case discussed with Dr. Alinda Money  Left hip fluid collection -Appreciate Ortho consult, Dr. Elesa Hacker fluid collection likely post op fluid collection -pt able to ambulate without pain and has full ROM with minimal pain -L-hip aspiration if develops left hip pain with persistent fever -request IR left hip aspiration although fluid may be sterile after 5 days of IV abx -07/29/17-->IR aspiration--23 cc orange opaque fluid -reconsulted ortho--spoke with Dr. Ephriam Jenkins  HIV/AIDS - Follows with ID at  [facility], has appt this August for routine follow-up  - VL <20 and CD4 478 in February '18  - Continue [place], [place], [place]  CKD stage 3 -baseline creatinine 1.1-1.4  Depression, anxiety  - Stable  - Continue Celexa and prn Xanax  Diarrhea -no diarrhea x 48-72 hours   Disposition Plan: not stable for d/c--hip aspiration 8/6 Family Communication: No Family at bedside  Consultants: Ortho--[place]; urology--[place]  Code Status: FULL  DVT Prophylaxis:  Heparin    Procedures: As Listed in Progress Note Above  Antibiotics: Ceftriaxone 7/31>>>   Subjective: Overall, patient states that her back pain is improving. She states that she has intermittent left hip pain with weightbearing although it is better than 1 week ago. Denies any fevers, chills, chest pain, shortness breath, nausea, vomiting, diarrhea, abdominal pain.  Objective: Vitals:   07/28/17 1504 07/28/17 2127 07/29/17 0505 07/29/17 1404  BP: 124/78 (!) 148/70 139/67 122/61  Pulse: 76 86 78 74  Resp: 18 17 17 18   Temp: 98.9 F (37.2 C) (!) 100.8 F (38.2 C) 99.7 F (37.6 C) 97.9 F (36.6 C)  TempSrc: Oral Oral Oral Oral  SpO2: 98% 100% 99% 98%  Weight:      Height:        Intake/Output Summary (Last 24 hours) at 07/29/17 1833 Last data filed at 07/29/17 1828  Gross per 24 hour  Intake              300 ml  Output             2500 ml  Net            -2200 ml   Weight change:  Exam:   General:  Pt is alert, follows commands appropriately, not in acute distress  HEENT: No icterus, No  thrush, No neck mass, [place]/AT  Cardiovascular: RRR, S1/S2, no rubs, no gallops  Respiratory: CTA bilaterally, no wheezing, no crackles, no rhonchi  Abdomen: Soft/+BS, non tender, non distended, no guarding  Extremities: No edema, No lymphangitis, No petechiae, No rashes, no synovitis   Data Reviewed: I have personally reviewed following labs and imaging studies Basic Metabolic  Panel:  Recent Labs Lab 07/23/17 1320 07/24/17 0604 07/25/17 0534 07/27/17 0530  NA 137 137 138 141  K 3.6 5.5* 3.6 3.9  CL 101 103 109 108  CO2 26 23 21* 24  GLUCOSE 148* 113* 125* 108*  BUN 14 12 11 10   CREATININE 1.48* 1.41* 1.26* 1.13*  CALCIUM 9.5 9.0 8.6* 8.9   Liver Function Tests:  Recent Labs Lab 07/23/17 1320  AST 21  ALT 8*  ALKPHOS 108  BILITOT 0.9  PROT 7.7  ALBUMIN 3.6   No results for input(s): LIPASE, AMYLASE in the last 168 hours. No results for input(s): AMMONIA in the last 168 hours. Coagulation Profile:  Recent Labs Lab 07/23/17 1320  INR 1.21   CBC:  Recent Labs Lab 07/23/17 1320 07/24/17 0604 07/25/17 0534 07/26/17 0648 07/27/17 0530 07/28/17 0445 07/29/17 1418  WBC 16.9* 15.6* 15.8* 17.3* 16.7* 14.9* 17.7*  NEUTROABS 13.8* 12.2*  --   --   --   --   --   HGB 13.3 12.5 11.2* 10.6* 11.0* 10.9* 10.8*  HCT 38.0 35.6* 32.1* 29.8* 30.6* 31.1* 30.7*  MCV 92.2 92.0 91.5 90.0 90.8 89.6 91.1  PLT 154 144* 152 172 191 220 309   Cardiac Enzymes: No results for input(s): CKTOTAL, CKMB, CKMBINDEX, TROPONINI in the last 168 hours. BNP: Invalid input(s): POCBNP CBG:  Recent Labs Lab 07/23/17 2124 07/24/17 0803 07/24/17 1214 07/24/17 1709  GLUCAP 124* 133* 105* 128*   HbA1C: No results for input(s): HGBA1C in the last 72 hours. Urine analysis:    Component Value Date/Time   COLORURINE AMBER (A) 07/23/2017 1350   APPEARANCEUR CLOUDY (A) 07/23/2017 1350   LABSPEC 1.018 07/23/2017 1350   PHURINE 5.5 07/23/2017 1350   GLUCOSEU NEGATIVE 07/23/2017 1350   HGBUR SMALL (A) 07/23/2017 1350   BILIRUBINUR NEGATIVE 07/23/2017 1350   KETONESUR NEGATIVE 07/23/2017 1350   PROTEINUR 30 (A) 07/23/2017 1350   UROBILINOGEN 0.2 09/23/2014 1056   NITRITE NEGATIVE 07/23/2017 1350   LEUKOCYTESUR LARGE (A) 07/23/2017 1350   Sepsis Labs: @LABRCNTIP (procalcitonin:4,lacticidven:4) ) Recent Results (from the past 240 hour(s))  Culture, blood  (Routine x 2)     Status: None   Collection Time: 07/23/17  1:20 PM  Result Value Ref Range Status   Specimen Description BLOOD BLOOD LEFT FOREARM  Final   Special Requests   Final    BOTTLES DRAWN AEROBIC AND ANAEROBIC Blood Culture results may not be optimal due to an inadequate volume of blood received in culture bottles   Culture   Final    NO GROWTH 5 DAYS Performed at [facility], [place] [address], [place], [place] [address]    Report Status 07/28/2017 FINAL  Final  Culture, Urine     Status: Abnormal   Collection Time: 07/23/17  1:50 PM  Result Value Ref Range Status   Specimen Description URINE, RANDOM  Final   Special Requests NONE  Final   Culture >=100,000 COLONIES/mL KLEBSIELLA PNEUMONIAE (A)  Final   Report Status 07/25/2017 FINAL  Final   Organism ID, Bacteria KLEBSIELLA PNEUMONIAE (A)  Final      Susceptibility   Klebsiella  pneumoniae - MIC*    AMPICILLIN >=32 RESISTANT Resistant     CEFAZOLIN <=4 SENSITIVE Sensitive     CEFTRIAXONE <=1 SENSITIVE Sensitive     CIPROFLOXACIN <=0.25 SENSITIVE Sensitive     GENTAMICIN <=1 SENSITIVE Sensitive     IMIPENEM 0.5 SENSITIVE Sensitive     NITROFURANTOIN 128 RESISTANT Resistant     TRIMETH/SULFA <=20 SENSITIVE Sensitive     AMPICILLIN/SULBACTAM 4 SENSITIVE Sensitive     PIP/TAZO <=4 SENSITIVE Sensitive     Extended ESBL NEGATIVE Sensitive     * >=100,000 COLONIES/mL KLEBSIELLA PNEUMONIAE  Culture, blood (Routine x 2)     Status: None   Collection Time: 07/23/17  2:10 PM  Result Value Ref Range Status   Specimen Description BLOOD RIGHT ANTECUBITAL  Final   Special Requests   Final    BOTTLES DRAWN AEROBIC AND ANAEROBIC Blood Culture adequate volume   Culture   Final    NO GROWTH 5 DAYS Performed at [facility], [place] [address] [place], [place] [address]    Report Status 07/28/2017 FINAL  Final  MRSA PCR Screening     Status: Abnormal   Collection Time: 07/23/17  6:48 PM  Result Value Ref Range Status    MRSA by PCR POSITIVE (A) NEGATIVE Final    Comment:        The GeneXpert MRSA Assay (FDA approved for NASAL specimens only), is one component of a comprehensive MRSA colonization surveillance program. It is not intended to diagnose MRSA infection nor to guide or monitor treatment for MRSA infections. RESULT CALLED TO, READ BACK BY AND VERIFIED WITH: [place] [id number] @ [id number] BY J SCOTTON      Scheduled Meds: . aspirin EC  81 mg Oral Daily  . Chlorhexidine Gluconate Cloth  6 each Topical Q0600  . citalopram  20 mg Oral q morning - 10a  . dolutegravir  50 mg Oral BID  . emtricitabine-tenofovir AF  1 tablet Oral Daily  . ferrous sulfate  325 mg Oral BID  . heparin  5,000 Units Subcutaneous Q8H  . maraviroc  300 mg Oral BID  . multivitamin with minerals  1 tablet Oral Daily  . mupirocin ointment  1 application Nasal BID  . pantoprazole  40 mg Oral Daily   Continuous Infusions: . cefTRIAXone (ROCEPHIN)  IV 2 g (07/29/17 1743)    Procedures/Studies: Ct Abdomen Pelvis Wo Contrast  Result Date: 07/25/2017 CLINICAL DATA:  Abdominal pain, fever, HIV/AIDS EXAM: CT ABDOMEN AND PELVIS WITHOUT CONTRAST TECHNIQUE: Multidetector CT imaging of the abdomen and pelvis was performed following the standard protocol without IV contrast. COMPARISON:  09/24/2016 FINDINGS: Lower chest: Lung bases are essentially clear. Hepatobiliary: Unenhanced liver is unremarkable. Gallbladder is unremarkable. No intrahepatic or extrahepatic duct dilatation. Pancreas: Within normal limits. Spleen: Within normal limits. Adrenals/Urinary Tract: Adrenal glands are within normal limits. 3 mm nonobstructing left upper pole renal calculus (series 2/ image 20). Additional punctate nonobstructing bilateral upper pole renal calculi (series 2/ image 24). Mild bilateral hydronephrosis. Bladder is within normal limits, noting trace nondependent gas. Stomach/Bowel: Stomach is notable for a small hiatal hernia. No evidence of  bowel obstruction. Appendix is not discretely visualized. Vascular/Lymphatic: No evidence of abdominal aortic aneurysm. No suspicious abdominopelvic lymphadenopathy. Reproductive: Uterus is within normal limits. No adnexal masses. Other: No abdominopelvic ascites. No evidence of intra-abdominal abscess. Musculoskeletal: Thoracolumbar spine is within normal limits. Avascular necrosis of the right femoral head. Left hip arthroplasty. Associated fluid collection anterior to the left  hip prosthesis (series 2/ image 81), mildly progressive from 2017, with tiny foci of gas (series 2/image 70). IMPRESSION: No evidence of intra-abdominal abscess. Fluid anterior to the left hip prosthesis with tiny foci of gas, mildly progressive when compared to 2017. While sterility is indeterminate, if there are clinical findings supporting infection in this location, consider aspiration. Bilateral renal calculi, measuring up to 3 mm on the left. Mild bilateral hydronephrosis. Electronically Signed   By: Julian Hy M.D.   On: 07/25/2017 20:34   Dg Chest 2 View  Result Date: 07/23/2017 CLINICAL DATA:  Low-grade fever.  Cough. EXAM: CHEST  2 VIEW COMPARISON:  04/02/2017. FINDINGS: Mediastinum and hilar structures normal. Lungs are clear. No pleural effusion or pneumothorax. Heart size normal. Biapical pleural thickening noted consistent with scarring . IMPRESSION: No acute cardiopulmonary disease. Electronically Signed   By: Marcello Moores  Register   On: 07/23/2017 12:56   Dg Pelvis Portable  Result Date: 07/26/2017 CLINICAL DATA:  61 y/o F; history of left total hip revision and left hip infection. Left hip pain started this morning. No injury or fever. EXAM: PORTABLE PELVIS 1-2 VIEWS COMPARISON:  07/25/2017 CT abdomen and pelvis. FINDINGS: Total left hip arthroplasty. No periprosthetic lucency or fracture is identified. The right hip joint space is maintained. Avascular necrosis of the right femoral head. No acute fracture or  diastases. IMPRESSION: 1. Total left hip arthroplasty. No periprosthetic lucency or fracture is identified. 2. Avascular necrosis of the right femoral head. 3. No acute fracture or pelvic diastases. Electronically Signed   By: Kristine Garbe M.D.   On: 07/26/2017 14:06   Dg Fluoro Guided Needle Plc Aspiration/injection Loc  Result Date: 07/29/2017 CLINICAL DATA:  Left hip infection. Leukocytosis. Persistent fever. EXAM: LEFT HIP ARTHROCENTESIS UNDER FLUOROSCOPY FLUOROSCOPY TIME:  Fluoroscopy Time:  0 minutes, 38 seconds Radiation Exposure Index (if provided by the fluoroscopic device): 16 mGy Number of Acquired Spot Images: 0 PROCEDURE: I discussed the risks (including hemorrhage and infection, among others), benefits, and alternatives to the procedure with the patient. We specifically discussed the high technical likelihood of success of the procedure. The patient understood and elected to undergo the procedure. Standard time-out was employed and left side confirmed and marked. Following sterile skin prep and local anesthetic administration consisting of 1% lidocaine, a 20 gauge needle was advanced without difficulty along the lateral edge of the base of the neck of the left femoral component of the hip prosthesis under fluoroscopic guidance. A total of 23 cc of opaque cloudy orange fluid was aspirated. The needle was subsequently removed and the skin cleansed and bandaged. No immediate complications were observed. IMPRESSION: 1. Technically successful left hip aspiration under fluoroscopy, yielding 23 cc of opaque cloudy orange fluid which was sent for requested lab analysis. Electronically Signed   By: Van Clines M.D.   On: 07/29/2017 16:01    Jaquin Coy, DO  [facility] Pager [phone]  If 7PM-7AM, please contact night-coverage [email or web address] Password [email or web address] 07/29/2017, 6:33 PM   LOS: 5 days

## 2017-07-29 NOTE — Progress Notes (Signed)
Dr. Carles Collet paged to ask if he would like 1400 heparin dose held due to patient is scheduled for drainage of left hip abscess today. Donne Hazel, RN

## 2017-07-29 NOTE — Progress Notes (Signed)
Pt voided 300 cc yellow urine. PVR 150 cc.  Will continue to monitor.

## 2017-07-29 NOTE — Care Management Important Message (Signed)
Important Message  Patient Details  Name: Ashley Shepherd MRN: [id number] Date of Birth: 09-26-1956   Medicare Important Message Given:  Yes    Kerin Salen 07/29/2017, 10:56 AMImportant Message  Patient Details  Name: Ashley Shepherd MRN: [id number] Date of Birth: 1956-08-09   Medicare Important Message Given:  Yes    Kerin Salen 07/29/2017, 10:56 AM

## 2017-07-29 NOTE — Procedures (Signed)
CLINICAL DATA: [Left hip infection. Leukocytosis. Persistent fever.] EXAM: [LEFT] HIP ARTHROCENTESIS UNDER FLUOROSCOPY FLUOROSCOPY TIME: Fluoroscopy Time: [0 minutes, 38 seconds] Radiation Exposure Index (if provided by the fluoroscopic device): [16 mGy] Number of Acquired Spot Images: [0] PROCEDURE: I discussed the risks (including hemorrhage and infection, among others), benefits, and alternatives to the procedure with the patient. We specifically discussed the high technical likelihood of success of the procedure. The patient understood and elected to undergo the procedure.   Standard time-out was employed and left side confirmed and marked. Following sterile skin prep and local anesthetic administration consisting of 1% lidocaine, a 20 gauge needle was advanced without difficulty along the lateral edge of the base of the neck of the left femoral component of the hip prosthesis under fluoroscopic guidance.  A total of 23 cc of opaque cloudy orange fluid was aspirated.  The needle was subsequently removed and the skin cleansed and bandaged. No immediate complications were observed.    IMPRESSION: [ 1. Technically successful left hip aspiration under fluoroscopy, yielding 23 cc of opaque cloudy orange fluid which was sent for requested lab analysis.]

## 2017-07-29 NOTE — Progress Notes (Signed)
Patient ID: Ashley Shepherd, female   DOB: November 06, 1956, 61 y.o.   MRN: [id number]    Subjective: Feeling fine.  No flank pain.  Feels better since hip aspiration but she was feeling improved even before that.  Objective: Vital signs in last 24 hours: Temp:  [97.9 F (36.6 C)-100.8 F (38.2 C)] 97.9 F (36.6 C) (08/06 1404) Pulse Rate:  [74-86] 74 (08/06 1404) Resp:  [17-18] 18 (08/06 1404) BP: (122-148)/(61-70) 122/61 (08/06 1404) SpO2:  [98 %-100 %] 98 % (08/06 1404)  Intake/Output from previous day: 08/05 0701 - 08/06 0700 In: 920 [P.O.:920] Out: 4475 [Urine:4475] Intake/Output this shift: No intake/output data recorded.  Physical Exam:  General: Alert and oriented GU: No CVAT  Lab Results:  Recent Labs  07/27/17 0530 07/28/17 0445 07/29/17 1418  HGB 11.0* 10.9* 10.8*  HCT 30.6* 31.1* 30.7*   CBC Latest Ref Rng & Units 07/29/2017 07/28/2017 07/27/2017  WBC 4.0 - 10.5 K/uL 17.7(H) 14.9(H) 16.7(H)  Hemoglobin 12.0 - 15.0 g/dL 10.8(L) 10.9(L) 11.0(L)  Hematocrit 36.0 - 46.0 % 30.7(L) 31.1(L) 30.6(L)  Platelets 150 - 400 K/uL 309 220 191     BMET  Recent Labs  07/27/17 0530  NA 141  K 3.9  CL 108  CO2 24  GLUCOSE 108*  BUN 10  CREATININE 1.13*  CALCIUM 8.9     Studies/Results: Dg Fluoro Guided Needle Plc Aspiration/injection Loc  Result Date: 07/29/2017 CLINICAL DATA:  Left hip infection. Leukocytosis. Persistent fever. EXAM: LEFT HIP ARTHROCENTESIS UNDER FLUOROSCOPY FLUOROSCOPY TIME:  Fluoroscopy Time:  0 minutes, 38 seconds Radiation Exposure Index (if provided by the fluoroscopic device): 16 mGy Number of Acquired Spot Images: 0 PROCEDURE: I discussed the risks (including hemorrhage and infection, among others), benefits, and alternatives to the procedure with the patient. We specifically discussed the high technical likelihood of success of the procedure. The patient understood and elected to undergo the procedure. Standard time-out was employed and left side  confirmed and marked. Following sterile skin prep and local anesthetic administration consisting of 1% lidocaine, a 20 gauge needle was advanced without difficulty along the lateral edge of the base of the neck of the left femoral component of the hip prosthesis under fluoroscopic guidance. A total of 23 cc of opaque cloudy orange fluid was aspirated. The needle was subsequently removed and the skin cleansed and bandaged. No immediate complications were observed. IMPRESSION: 1. Technically successful left hip aspiration under fluoroscopy, yielding 23 cc of opaque cloudy orange fluid which was sent for requested lab analysis. Electronically Signed   By: Van Clines M.D.   On: 07/29/2017 16:01    Assessment/Plan: Pyelonephritis - Fever curve improved but WBC increasing.  Continue culture specific antibiotics for pyelonephritis as patient is definitely clinically improving.   LOS: 5 days   Orris Perin,LES 07/29/2017, 8:10 PM

## 2017-07-30 ENCOUNTER — Telehealth (INDEPENDENT_AMBULATORY_CARE_PROVIDER_SITE_OTHER): Payer: Self-pay | Admitting: Radiology

## 2017-07-30 DIAGNOSIS — Z8249 Family history of ischemic heart disease and other diseases of the circulatory system: Secondary | ICD-10-CM

## 2017-07-30 DIAGNOSIS — T8459XA Infection and inflammatory reaction due to other internal joint prosthesis, initial encounter: Secondary | ICD-10-CM

## 2017-07-30 DIAGNOSIS — F17210 Nicotine dependence, cigarettes, uncomplicated: Secondary | ICD-10-CM

## 2017-07-30 DIAGNOSIS — L02416 Cutaneous abscess of left lower limb: Secondary | ICD-10-CM

## 2017-07-30 DIAGNOSIS — Z882 Allergy status to sulfonamides status: Secondary | ICD-10-CM

## 2017-07-30 DIAGNOSIS — Z888 Allergy status to other drugs, medicaments and biological substances status: Secondary | ICD-10-CM

## 2017-07-30 DIAGNOSIS — Z833 Family history of diabetes mellitus: Secondary | ICD-10-CM

## 2017-07-30 LAB — CBC
HCT: 30.6 % — ABNORMAL LOW (ref 36.0–46.0)
HEMOGLOBIN: 10.5 g/dL — AB (ref 12.0–15.0)
MCH: 31.1 pg (ref 26.0–34.0)
MCHC: 34.3 g/dL (ref 30.0–36.0)
MCV: 90.5 fL (ref 78.0–100.0)
PLATELETS: 316 10*3/uL (ref 150–400)
RBC: 3.38 MIL/uL — AB (ref 3.87–5.11)
RDW: 15.3 % (ref 11.5–15.5)
WBC: 16.3 10*3/uL — AB (ref 4.0–10.5)

## 2017-07-30 LAB — BASIC METABOLIC PANEL
ANION GAP: 10 (ref 5–15)
BUN: 10 mg/dL (ref 6–20)
CHLORIDE: 102 mmol/L (ref 101–111)
CO2: 28 mmol/L (ref 22–32)
Calcium: 9 mg/dL (ref 8.9–10.3)
Creatinine, Ser: 0.99 mg/dL (ref 0.44–1.00)
GFR calc non Af Amer: >60 mL/min (ref >60)
Glucose, Bld: 110 mg/dL — ABNORMAL HIGH (ref 65–99)
Potassium: 3.4 mmol/L — ABNORMAL LOW (ref 3.5–5.1)
SODIUM: 140 mmol/L (ref 135–145)

## 2017-07-30 LAB — C-REACTIVE PROTEIN: CRP: 30 mg/dL — AB (ref <1.0)

## 2017-07-30 LAB — SEDIMENTATION RATE: Sed Rate: 127 mm/hr — ABNORMAL HIGH (ref 0–22)

## 2017-07-30 MED ORDER — POTASSIUM CHLORIDE CRYS ER 20 MEQ PO TBCR
20.0000 meq | EXTENDED_RELEASE_TABLET | Freq: Once | ORAL | Status: AC
  Administered 2017-07-30: 20 meq via ORAL
  Filled 2017-07-30: qty 1

## 2017-07-30 NOTE — Telephone Encounter (Signed)
Patient's husband Ashley Shepherd calling today triage line. He is frustrated. He states his wife is in the hospital for her third hip infection. She is now feeling better and she really needs to be home to take care of their daughter that has special needs. I advised Dr. Ninfa Linden is not in the office today but I would let his PA Benita Stabile know. He would like me to page Dr. Ninfa Linden. I told him I cannot do that. But I would like to speak with Artis Delay. Artis Delay counseled him over the phone.

## 2017-07-30 NOTE — Progress Notes (Addendum)
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  At [facility] ED, patient is found to be febrile to 39.2 C, Urinalysis is suggestive of infection and features hemoglobin on the dipstick. In the ED, CBC was notable for a creatinine 1.48, up from 1.36 a year ago. CBC features a leukocytosis of 16,900. Lactic acid is minimally elevated to 2.02.  The patient was started on ceftriaxone for pyelonephritis. Urine cultures grew Klebsiella. Unfortunately, the patient had persistent fevers. CT of the abdomen and pelvis revealed a fluid collection anterior to the left prosthetic hip. The fluid collection was aspirated. ID was consulted for treatment of left prosthetic hip infection.  Assessment/Plan: Sepsis  -present at time of admission -secondary to pyelonephritis and PJI -continue ceftriaxone -still spiking fevers-->IR for aspiration of left hip fluid collection -blood cultures neg to date  Pyelonephritis -07/25/2017 CT abdomen and pelvis--nonobstructive left upper pole renal calculus, additional nonobstructive bilateral upper pole calculi, mild bilateral hydronephrosis, fluid collection anterior to left hip prosthesis -Continue ceftriaxone -Urine cultures =Klebsiella -Blood cultures remain negative -appreciate urology consult--hydronephrosis appears to be chronic finding based upon being intermittently demonstrated similar findings on prior scans--may have vesicoureteral reflux -case discussed with Dr. Alinda Money  Left hip fluid collection/Prosthetic Joint Infection -L-hip aspiration due to persistent fever -07/29/17-->IR aspiration--23 cc  orange opaque fluid -reconsulted ortho--spoke with Dr. Ephriam Jenkins -8/6--gram stain GPC chains/clusters; culture neg at <24 hours -01/24/16 hip culture--GBS -consulted ID-continue ceftriaxone pending consult -CRP--30.0/ESR--127  HIV/AIDS - Follows with ID at [facility], has appt this August for routine follow-up  - VL <20 and CD4 478 in February '18  - Continue Descovy, [place], [place]  CKD stage 3 -baseline creatinine 1.1-1.4  Depression, anxiety  - Stable  - Continue Celexa and prn Xanax  Hypokalemia -replete -check mag   Disposition Plan: not stable for d/c Family Communication: updated spouse on phone--Total time spent 35 minutes.  Greater than 50% spent face to face counseling and coordinating care.   Consultants: Ortho--Blackman; urology--Borden, ID--Snider  Code Status: FULL  DVT Prophylaxis: [place] Heparin    Procedures: As Listed in Progress Note Above  Antibiotics: Ceftriaxone 7/31>>>     Subjective: Patient states that her left hip pain and back pain are gradually improving but slowly. Denies any fevers, chills, chest pain, shortness breath, nausea, vomiting, diarrhea, abdominal pain. No dysuria or hematuria.  Objective: Vitals:   07/29/17 0505 07/29/17 1404 07/29/17 2111 07/30/17 0620  BP: 139/67 122/61 (!) 147/68 119/62  Pulse: 78 74 73 68  Resp: _0 Temp: 99.7 F (37.6 C) 97.9 F (36.6 C) (!) 100.4 F (38 C) 98.4 F (36.9 C)  TempSrc: Oral Oral Oral Oral  SpO2: 99% 98% 100% 98%  Weight:      Height:        Intake/Output Summary (Last 24 hours) at 07/30/17 1217 Last data filed at 07/30/17 1012  Gross per 24 hour  Intake             1360 ml  Output             1325 ml  Net  35 ml   Weight change:  Exam:   General:  Pt is alert, follows commands appropriately, not in acute distress  HEENT: No icterus, No thrush, No neck mass, [place]/AT  Cardiovascular: RRR, S1/S2, no rubs, no gallops  Respiratory: CTA  bilaterally, no wheezing, no crackles, no rhonchi  Abdomen: Soft/+BS, non tender, non distended, no guarding  Extremities: No edema, No lymphangitis, No petechiae, No rashes, no synovitis; mild pain in the left hip with active range of motion   Data Reviewed: I have personally reviewed following labs and imaging studies Basic Metabolic Panel:  Recent Labs Lab 07/23/17 1320 07/24/17 0604 07/25/17 0534 07/27/17 0530 07/30/17 0552  NA 137 137 138 141 140  K 3.6 5.5* 3.6 3.9 3.4*  CL 101 103 109 108 102  CO2 26 23 21* 24 28  GLUCOSE 148* 113* 125* 108* 110*  BUN _0 CREATININE 1.48* 1.41* 1.26* 1.13* 0.99  CALCIUM 9.5 9.0 8.6* 8.9 9.0   Liver Function Tests:  Recent Labs Lab 07/23/17 1320  AST 21  ALT 8*  ALKPHOS 108  BILITOT 0.9  PROT 7.7  ALBUMIN 3.6   No results for input(s): LIPASE, AMYLASE in the last 168 hours. No results for input(s): AMMONIA in the last 168 hours. Coagulation Profile:  Recent Labs Lab 07/23/17 1320  INR 1.21   CBC:  Recent Labs Lab 07/23/17 1320 07/24/17 0604  07/26/17 0648 07/27/17 0530 07/28/17 0445 07/29/17 1418 07/30/17 0552  WBC 16.9* 15.6*  < > 17.3* 16.7* 14.9* 17.7* 16.3*  NEUTROABS 13.8* 12.2*  --   --   --   --   --   --   HGB 13.3 12.5  < > 10.6* 11.0* 10.9* 10.8* 10.5*  HCT 38.0 35.6*  < > 29.8* 30.6* 31.1* 30.7* 30.6*  MCV 92.2 92.0  < > 90.0 90.8 89.6 91.1 90.5  PLT 154 144*  < > 172 191 220 309 316  < > = values in this interval not displayed. Cardiac Enzymes: No results for input(s): CKTOTAL, CKMB, CKMBINDEX, TROPONINI in the last 168 hours. BNP: Invalid input(s): POCBNP CBG:  Recent Labs Lab 07/23/17 2124 07/24/17 0803 07/24/17 1214 07/24/17 1709  GLUCAP 124* 133* 105* 128*   HbA1C: No results for input(s): HGBA1C in the last 72 hours. Urine analysis:    Component Value Date/Time   COLORURINE AMBER (A) 07/23/2017 1350   APPEARANCEUR CLOUDY (A) 07/23/2017 1350   LABSPEC 1.018  07/23/2017 1350   PHURINE 5.5 07/23/2017 1350   GLUCOSEU NEGATIVE 07/23/2017 1350   HGBUR SMALL (A) 07/23/2017 1350   BILIRUBINUR NEGATIVE 07/23/2017 1350   KETONESUR NEGATIVE 07/23/2017 1350   PROTEINUR 30 (A) 07/23/2017 1350   UROBILINOGEN 0.2 09/23/2014 1056   NITRITE NEGATIVE 07/23/2017 1350   LEUKOCYTESUR LARGE (A) 07/23/2017 1350   Sepsis Labs: _1 (procalcitonin:4,lacticidven:4) ) Recent Results (from the past 240 hour(s))  Culture, blood (Routine x 2)     Status: None   Collection Time: 07/23/17  1:20 PM  Result Value Ref Range Status   Specimen Description BLOOD BLOOD LEFT FOREARM  Final   Special Requests   Final    BOTTLES DRAWN AEROBIC AND ANAEROBIC Blood Culture results may not be optimal due to an inadequate volume of blood received in culture bottles   Culture   Final    NO GROWTH 5 DAYS Performed at [facility], [place] [address] [place], [place] [address]    Report Status 07/28/2017 FINAL  Final  Culture, Urine     Status: Abnormal   Collection Time: 07/23/17  1:50 PM  Result Value Ref Range Status   Specimen Description URINE, RANDOM  Final   Special Requests NONE  Final   Culture >=100,000 COLONIES/mL KLEBSIELLA PNEUMONIAE (A)  Final   Report Status 07/25/2017 FINAL  Final   Organism ID, Bacteria KLEBSIELLA PNEUMONIAE (A)  Final      Susceptibility   Klebsiella pneumoniae - MIC*    AMPICILLIN >=32 RESISTANT Resistant     CEFAZOLIN <=4 SENSITIVE Sensitive     CEFTRIAXONE <=1 SENSITIVE Sensitive     CIPROFLOXACIN <=0.25 SENSITIVE Sensitive     GENTAMICIN <=1 SENSITIVE Sensitive     IMIPENEM 0.5 SENSITIVE Sensitive     NITROFURANTOIN 128 RESISTANT Resistant     TRIMETH/SULFA <=20 SENSITIVE Sensitive     AMPICILLIN/SULBACTAM 4 SENSITIVE Sensitive     PIP/TAZO <=4 SENSITIVE Sensitive     Extended ESBL NEGATIVE Sensitive     * >=100,000 COLONIES/mL KLEBSIELLA PNEUMONIAE  Culture, blood (Routine x 2)     Status: None   Collection Time: 07/23/17   2:10 PM  Result Value Ref Range Status   Specimen Description BLOOD RIGHT ANTECUBITAL  Final   Special Requests   Final    BOTTLES DRAWN AEROBIC AND ANAEROBIC Blood Culture adequate volume   Culture   Final    NO GROWTH 5 DAYS Performed at [facility], [address] [place], [place] [address]    Report Status 07/28/2017 FINAL  Final  MRSA PCR Screening     Status: Abnormal   Collection Time: 07/23/17  6:48 PM  Result Value Ref Range Status   MRSA by PCR POSITIVE (A) NEGATIVE Final    Comment:        The GeneXpert MRSA Assay (FDA approved for NASAL specimens only), is one component of a comprehensive MRSA colonization surveillance program. It is not intended to diagnose MRSA infection nor to guide or monitor treatment for MRSA infections. RESULT CALLED TO, READ BACK BY AND VERIFIED WITH: Charleen Kirks [id number] @ [id number] BY J SCOTTON   Body fluid culture     Status: None (Preliminary result)   Collection Time: 07/29/17  3:30 PM  Result Value Ref Range Status   Specimen Description SYNOVIAL LEFT HIP  Final   Special Requests NONE  Final   Gram Stain   Final    ABUNDANT WBC PRESENT,BOTH PMN AND MONONUCLEAR FEW GRAM POSITIVE COCCI IN PAIRS IN CLUSTERS    Culture   Final    NO GROWTH < 24 HOURS Performed at [facility], [place] [address] [place], [place] [address]    Report Status PENDING  Incomplete     Scheduled Meds: . aspirin EC  81 mg Oral Daily  . citalopram  20 mg Oral q morning - 10a  . dolutegravir  50 mg Oral BID  . emtricitabine-tenofovir AF  1 tablet Oral Daily  . ferrous sulfate  325 mg Oral BID  . heparin  5,000 Units Subcutaneous Q8H  . maraviroc  300 mg Oral BID  . multivitamin with minerals  1 tablet Oral Daily  . pantoprazole  40 mg Oral Daily   Continuous Infusions: . cefTRIAXone (ROCEPHIN)  IV 2 g (07/29/17 1743)    Procedures/Studies: Ct Abdomen Pelvis Wo Contrast  Result Date: 07/25/2017 CLINICAL DATA:  Abdominal pain, fever,  HIV/AIDS EXAM: CT ABDOMEN AND PELVIS WITHOUT CONTRAST TECHNIQUE: Multidetector CT imaging of the abdomen and pelvis was  performed following the standard protocol without IV contrast. COMPARISON:  09/24/2016 FINDINGS: Lower chest: Lung bases are essentially clear. Hepatobiliary: Unenhanced liver is unremarkable. Gallbladder is unremarkable. No intrahepatic or extrahepatic duct dilatation. Pancreas: Within normal limits. Spleen: Within normal limits. Adrenals/Urinary Tract: Adrenal glands are within normal limits. 3 mm nonobstructing left upper pole renal calculus (series 2/ image 20). Additional punctate nonobstructing bilateral upper pole renal calculi (series 2/ image 24). Mild bilateral hydronephrosis. Bladder is within normal limits, noting trace nondependent gas. Stomach/Bowel: Stomach is notable for a small hiatal hernia. No evidence of bowel obstruction. Appendix is not discretely visualized. Vascular/Lymphatic: No evidence of abdominal aortic aneurysm. No suspicious abdominopelvic lymphadenopathy. Reproductive: Uterus is within normal limits. No adnexal masses. Other: No abdominopelvic ascites. No evidence of intra-abdominal abscess. Musculoskeletal: Thoracolumbar spine is within normal limits. Avascular necrosis of the right femoral head. Left hip arthroplasty. Associated fluid collection anterior to the left hip prosthesis (series 2/ image 81), mildly progressive from 2017, with tiny foci of gas (series 2/image 70). IMPRESSION: No evidence of intra-abdominal abscess. Fluid anterior to the left hip prosthesis with tiny foci of gas, mildly progressive when compared to 2017. While sterility is indeterminate, if there are clinical findings supporting infection in this location, consider aspiration. Bilateral renal calculi, measuring up to 3 mm on the left. Mild bilateral hydronephrosis. Electronically Signed   By: Julian Hy M.D.   On: 07/25/2017 20:34   Dg Chest 2 View  Result Date:  07/23/2017 CLINICAL DATA:  Low-grade fever.  Cough. EXAM: CHEST  2 VIEW COMPARISON:  04/02/2017. FINDINGS: Mediastinum and hilar structures normal. Lungs are clear. No pleural effusion or pneumothorax. Heart size normal. Biapical pleural thickening noted consistent with scarring . IMPRESSION: No acute cardiopulmonary disease. Electronically Signed   By: Marcello Moores  Register   On: 07/23/2017 12:56   Dg Pelvis Portable  Result Date: 07/26/2017 CLINICAL DATA:  61 y/o F; history of left total hip revision and left hip infection. Left hip pain started this morning. No injury or fever. EXAM: PORTABLE PELVIS 1-2 VIEWS COMPARISON:  07/25/2017 CT abdomen and pelvis. FINDINGS: Total left hip arthroplasty. No periprosthetic lucency or fracture is identified. The right hip joint space is maintained. Avascular necrosis of the right femoral head. No acute fracture or diastases. IMPRESSION: 1. Total left hip arthroplasty. No periprosthetic lucency or fracture is identified. 2. Avascular necrosis of the right femoral head. 3. No acute fracture or pelvic diastases. Electronically Signed   By: Kristine Garbe M.D.   On: 07/26/2017 14:06   Dg Fluoro Guided Needle Plc Aspiration/injection Loc  Result Date: 07/29/2017 CLINICAL DATA:  Left hip infection. Leukocytosis. Persistent fever. EXAM: LEFT HIP ARTHROCENTESIS UNDER FLUOROSCOPY FLUOROSCOPY TIME:  Fluoroscopy Time:  0 minutes, 38 seconds Radiation Exposure Index (if provided by the fluoroscopic device): 16 mGy Number of Acquired Spot Images: 0 PROCEDURE: I discussed the risks (including hemorrhage and infection, among others), benefits, and alternatives to the procedure with the patient. We specifically discussed the high technical likelihood of success of the procedure. The patient understood and elected to undergo the procedure. Standard time-out was employed and left side confirmed and marked. Following sterile skin prep and local anesthetic administration consisting of  1% lidocaine, a 20 gauge needle was advanced without difficulty along the lateral edge of the base of the neck of the left femoral component of the hip prosthesis under fluoroscopic guidance. A total of 23 cc of opaque cloudy orange fluid was aspirated. The needle was subsequently removed and the  skin cleansed and bandaged. No immediate complications were observed. IMPRESSION: 1. Technically successful left hip aspiration under fluoroscopy, yielding 23 cc of opaque cloudy orange fluid which was sent for requested lab analysis. Electronically Signed   By: Van Clines M.D.   On: 07/29/2017 16:01    Lynnae Ludemann, DO  [facility] Pager [phone]  If 7PM-7AM, please contact night-coverage [email or web address] Password [email or web address] 07/30/2017, 12:17 PM   LOS: 6 days

## 2017-07-30 NOTE — Consult Note (Signed)
ORTHOPAEDIC CONSULTATION  REQUESTING PHYSICIAN: Tat, Shanon Brow, MD  Chief Complaint: left hip pain  HPI: Ashley Shepherd is a 61 y.o. female who presents with left hip pain over the last few days with progressive worsening.  She's currently admitted for UTI and pyelonephritis and fluid collection was seen on CT scan.  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  She has been febrile since her admission.  Hip was aspirated yesterday by IR and she feels much better today.  Not septic appearing.  Past Medical History:  Diagnosis Date  . Anemia   . Anxiety   . Arthritis   . 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[place])    left  . Cancer ([place])    7 years ago, cancer free now  . Depression   . Difficulty sleeping   . GERD (gastroesophageal reflux disease)   . History of kidney stones   . History of transfusion   . HIV disease ([place])   . Kidney stones   . Pneumonia    Past Surgical History:  Procedure Laterality Date  . ANTERIOR HIP REVISION Left 04/06/2016   Procedure: REMOVAL OF TEMPORARY ANTIBIOTIC SPACER LEFT HIP, LEFT TOTAL HIP REVISION;  Surgeon: Mcarthur Rossetti, MD;  Location: [facility];  Service: Orthopedics;  Laterality: Left;  . APPENDECTOMY    . COLON SURGERY  2008  . COLOSTOMY REVERSAL  2013  . EXCISIONAL TOTAL HIP ARTHROPLASTY WITH ANTIBIOTIC SPACERS Left 01/24/2016   Procedure: EXCISION OF LEFT TOTAL HIP ARTHROPLASTY WITH PLACEMENT OF ANTIBIOTIC SPACERS;  Surgeon: Mcarthur Rossetti, MD;  Location: [place];  Service: Orthopedics;  Laterality: Left;  . INCISION AND DRAINAGE HIP Left 01/24/2016   Procedure: IRRIGATION AND DEBRIDEMENT LEFT HIP;  Surgeon: Mcarthur Rossetti, MD;  Location: [facility];  Service: Orthopedics;  Laterality: Left;  . KNEE ARTHROSCOPY     left  . PORT-A-CATH REMOVAL    . TOTAL HIP ARTHROPLASTY Left 10/01/2014   Procedure: LEFT TOTAL HIP ARTHROPLASTY ANTERIOR APPROACH;  Surgeon: Mcarthur Rossetti, MD;  Location: [facility];  Service: Orthopedics;   Laterality: Left;   Social History   Social History  . Marital status: Widowed    Spouse name: N/A  . Number of children: N/A  . Years of education: N/A   Social History Main Topics  . Smoking status: Current Every Day Smoker    Packs/day: 0.50    Types: Cigarettes  . Smokeless tobacco: Never Used  . Alcohol use No  . Drug use: No  . Sexual activity: Not Asked   Other Topics Concern  . None   Social History Narrative  . None   Family History  Problem Relation Age of Onset  . Hypertension Father   . Diabetes Father   . Heart attack Father   . Hypertension Other   . Diabetes Other    - negative except otherwise stated in the family history section Allergies  Allergen Reactions  . Codeine Itching  . Ivp Dye [Iodinated Diagnostic Agents] Itching    Pt stated that she had NO problem/reaction to topical iodine/betadine solution.  . Sulfa Antibiotics Itching   Prior to Admission medications   Medication Sig Start Date End Date Taking? Authorizing Provider  ALPRAZolam Duanne Moron) 1 MG tablet Take 0.5 mg by mouth at bedtime as needed for anxiety.    Yes [provider]  aspirin EC 81 MG tablet Take 81 mg by mouth daily.   Yes [provider]  Calcium  Citrate-Vitamin D (CALCIUM + D PO) Take 1 tablet by mouth daily.   Yes [provider]  citalopram (CELEXA) 20 MG tablet Take 20 mg by mouth every morning.    Yes [provider]  dolutegravir (TIVICAY) 50 MG tablet Take 50 mg by mouth 2 (two) times daily.  05/02/15  Yes [provider]  emtricitabine-tenofovir AF (DESCOVY) 200-25 MG tablet Take 1 tablet by mouth daily. 12/05/15  Yes [provider]  ferrous sulfate 325 (65 FE) MG tablet Take 325 mg by mouth 2 (two) times daily.    Yes [provider]  maraviroc (SELZENTRY) 300 MG tablet Take 300 mg by mouth 2 (two) times daily. Reported on 01/24/2016   Yes [provider]  Multiple Vitamin (MULTIVITAMIN WITH  MINERALS) TABS tablet Take 1 tablet by mouth daily.   Yes [provider]  dicyclomine (BENTYL) 10 MG capsule Take 10 mg by mouth 4 (four) times daily as needed for spasms.     [provider]  ondansetron (ZOFRAN) 4 MG tablet Take 1 tablet (4 mg total) by mouth every 6 (six) hours as needed for nausea or vomiting. Patient not taking: Reported on 9/37/9024 0/97/35   Delora Fuel, MD  pantoprazole (PROTONIX) 40 MG tablet Take 40 mg by mouth daily.    [provider]   Dg Fluoro Guided Needle Plc Aspiration/injection Loc  Result Date: 07/29/2017 CLINICAL DATA:  Left hip infection. Leukocytosis. Persistent fever. EXAM: LEFT HIP ARTHROCENTESIS UNDER FLUOROSCOPY FLUOROSCOPY TIME:  Fluoroscopy Time:  0 minutes, 38 seconds Radiation Exposure Index (if provided by the fluoroscopic device): 16 mGy Number of Acquired Spot Images: 0 PROCEDURE: I discussed the risks (including hemorrhage and infection, among others), benefits, and alternatives to the procedure with the patient. We specifically discussed the high technical likelihood of success of the procedure. The patient understood and elected to undergo the procedure. Standard time-out was employed and left side confirmed and marked. Following sterile skin prep and local anesthetic administration consisting of 1% lidocaine, a 20 gauge needle was advanced without difficulty along the lateral edge of the base of the neck of the left femoral component of the hip prosthesis under fluoroscopic guidance. A total of 23 cc of opaque cloudy orange fluid was aspirated. The needle was subsequently removed and the skin cleansed and bandaged. No immediate complications were observed. IMPRESSION: 1. Technically successful left hip aspiration under fluoroscopy, yielding 23 cc of opaque cloudy orange fluid which was sent for requested lab analysis. Electronically Signed   By: Van Clines M.D.   On: 07/29/2017 16:01   - pertinent xrays, CT, MRI  studies were reviewed and independently interpreted  Positive ROS: All other systems have been reviewed and were otherwise negative with the exception of those mentioned in the HPI and as above.  Physical Exam: General: Alert, no acute distress Cardiovascular: No pedal edema Respiratory: No cyanosis, no use of accessory musculature GI: No organomegaly, abdomen is soft and non-tender Skin: No lesions in the area of chief complaint Neurologic: Sensation intact distally Psychiatric: Patient is competent for consent with normal mood and affect Lymphatic: No axillary or cervical lymphadenopathy  MUSCULOSKELETAL:  - surgical scar fully healed - good ROM without significant pain - thigh is slightly warm  Assessment: Left hip PJI  Plan: - unable to obtain cell count due to clotted specimen - cultures show GPCs - prior cultures showed strep - will d/w Dr. Ninfa Linden regarding surgical planning - will need surgery  Thank you for  the consult and the opportunity to see Ms. Robins  N. Eduard Roux, MD [place] 7:57 AM

## 2017-07-30 NOTE — Consult Note (Signed)
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 She underwent prosthesis resection and abtx spacer on 2/7, treated with cefazolin, then on 4/15 had 2 stage revision of the left THA with removal of temp antibiotic spacer and placement of revision acetabular and femoral component by dr Ninfa Linden. On 07/23/17 she was admitted to the ED for fever of 39.2C, dysuria and flank pain. WBC was 16.9K, LA 2.02, started on levofloxacin. Ucx showed klebsiella S to ceftriaxone but still had persistent fever. Ct showed mild bilateral hydronephrosis and fluid collection anterior to the left hip prosthesis which was subsequently aspirated- which was 37m of  brown and turbid, 77% N unable to do cell count but the gram stain showed GPC in prs and clusters. patientt feels better since aspiration of joint. Anticipated to get washout. Patient remains on ceftriaxone 2gm IV daily for pyelonephritis.  She is worried about her 265yodaughter who has cognitive delay-mindset of 61yo. Concerned about who is caring for her.  Past Medical History:  Diagnosis Date  . Anemia   . Anxiety   . Arthritis   . 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    [place]  . [place]    7 [place] ago, cancer free now  . Depression   . Difficulty sleeping   . GERD (gastroesophageal reflux disease)   . History of kidney stones   . History of transfusion   . HIV disease ([place]   . Kidney stones   .  Pneumonia     Allergies:  Allergies  Allergen Reactions  . Codeine Itching  . Ivp Dye [Iodinated Diagnostic Agents] Itching    Pt stated that she had NO problem/reaction to topical iodine/betadine solution.  . Sulfa Antibiotics Itching    MEDICATIONS: . aspirin EC  81 mg Oral Daily  . citalopram  20 mg Oral q morning - 10a  . dolutegravir  50 mg Oral BID  . emtricitabine-tenofovir AF  1 tablet Oral Daily  . ferrous sulfate  325 mg Oral BID  . heparin  5,000 Units Subcutaneous Q8H  . maraviroc  300 mg Oral BID  . multivitamin with minerals  1 tablet Oral Daily  . pantoprazole  40 mg Oral Daily  . potassium chloride  20 mEq Oral Once    Social History  Substance Use Topics  . Smoking status: Current Every Day Smoker    Packs/day: 0.50    Types: Cigarettes  . Smokeless tobacco: Never Used  . Alcohol use No    Family History  Problem Relation Age of Onset  . Hypertension Father   . Diabetes Father   . Heart attack Father   . Hypertension Other   . Diabetes Other      Review of Systems  Constitutional: Negative for fever, chills, diaphoresis, activity change, appetite change, fatigue and  unexpected weight change.  HENT: Negative for congestion, sore throat, rhinorrhea, sneezing, trouble swallowing and sinus pressure.  Eyes: Negative for photophobia and visual disturbance.  Respiratory: Negative for cough, chest tightness, shortness of breath, wheezing and stridor.  Cardiovascular: Negative for chest pain, palpitations and leg swelling.  Gastrointestinal: Negative for nausea, vomiting, abdominal pain, diarrhea, constipation, blood in stool, abdominal distention and anal bleeding.  Genitourinary: Negative for dysuria, hematuria, flank pain and difficulty urinating.  Musculoskeletal: Negative for myalgias, back pain, joint swelling, arthralgias and gait problem.  Skin: Negative for color change, pallor, rash and wound.  Neurological: Negative for dizziness, tremors,  weakness and light-headedness.  Hematological: Negative for adenopathy. Does not bruise/bleed easily.  Psychiatric/Behavioral: Negative for behavioral problems, confusion, sleep disturbance, dysphoric mood, decreased concentration and agitation.     OBJECTIVE: Temp:  [98.4 F (36.9 C)-100.4 F (38 C)] 100.4 F (38 C) (08/07 1506) Pulse Rate:  [68-88] 88 (08/07 1506) Resp:  [18] 18 (08/07 1506) BP: (119-147)/(62-73) 135/73 (08/07 1506) SpO2:  [98 %-100 %] 99 % (08/07 1506) Physical Exam  Constitutional:  oriented to person, place, and time. appears well-developed and well-nourished. No distress.  HENT: [place]/AT, PERRLA, no scleral icterus Mouth/Throat: Oropharynx is clear and moist. No oropharyngeal exudate.  Cardiovascular: Normal rate, regular rhythm and normal heart sounds. Exam reveals no gallop and no friction rub.  No murmur heard.  Pulmonary/Chest: Effort normal and breath sounds normal. No respiratory distress.  has no wheezes.  Neck = supple, no nuchal rigidity Abdominal: Soft. Bowel sounds are normal.  exhibits no distension. There is no tenderness.  Lymphadenopathy: no cervical adenopathy. No axillary adenopathy Neurological: alert and oriented to person, place, and time.  Skin: Skin is warm and dry. No rash noted. No erythema.  Psychiatric: a normal mood and affect.  behavior is normal.   LABS: Results for orders placed or performed during the hospital encounter of 07/23/17 (from the past 48 hour(s))  CBC     Status: Abnormal   Collection Time: 07/29/17  2:18 PM  Result Value Ref Range   WBC 17.7 (H) 4.0 - 10.5 K/uL   RBC 3.37 (L) 3.87 - 5.11 MIL/uL   Hemoglobin 10.8 (L) 12.0 - 15.0 g/dL   HCT 30.7 (L) 36.0 - 46.0 %   MCV 91.1 78.0 - 100.0 fL   MCH 32.0 26.0 - 34.0 pg   MCHC 35.2 30.0 - 36.0 g/dL   RDW 15.2 11.5 - 15.5 %   Platelets 309 150 - 400 K/uL  Body fluid culture     Status: None (Preliminary result)   Collection Time: 07/29/17  3:30 PM  Result Value Ref  Range   Specimen Description SYNOVIAL LEFT HIP    Special Requests NONE    Gram Stain      ABUNDANT WBC PRESENT,BOTH PMN AND MONONUCLEAR FEW GRAM POSITIVE COCCI IN PAIRS IN CLUSTERS    Culture      NO GROWTH < 24 HOURS Performed at [facility], [place] [address] [place],  [address]    Report Status PENDING   Synovial cell count + diff, w/ crystals     Status: Abnormal   Collection Time: 07/29/17  3:30 PM  Result Value Ref Range   Color, Synovial BROWN (A) YELLOW   Appearance-Synovial TURBID (A) CLEAR   Crystals, Fluid NO CRYSTALS SEEN    WBC, Synovial UNABLE TO PERFORM COUNT DUE TO CLOT IN SPECIMEN 0 - 200 /cu mm   Neutrophil, Synovial 77 (H) 0 -  25 %   Lymphocytes-Synovial Fld 4 0 - 20 %   Monocyte-Macrophage-Synovial Fluid 19 (L) 50 - 90 %  Basic metabolic panel     Status: Abnormal   Collection Time: 07/30/17  5:52 AM  Result Value Ref Range   Sodium 140 135 - 145 mmol/L   Potassium 3.4 (L) 3.5 - 5.1 mmol/L   Chloride 102 101 - 111 mmol/L   CO2 28 22 - 32 mmol/L   Glucose, Bld 110 (H) 65 - 99 mg/dL   BUN 10 6 - 20 mg/dL   Creatinine, Ser 0.99 0.44 - 1.00 mg/dL   Calcium 9.0 8.9 - 10.3 mg/dL   GFR calc non Af Amer >60 >60 mL/min   GFR calc Af Amer >60 >60 mL/min    Comment: (NOTE) The eGFR has been calculated using the CKD EPI equation. This calculation has not been validated in all clinical situations. eGFR's persistently <60 mL/min signify possible Chronic Kidney Disease.    Anion gap 10 5 - 15  CBC     Status: Abnormal   Collection Time: 07/30/17  5:52 AM  Result Value Ref Range   WBC 16.3 (H) 4.0 - 10.5 K/uL   RBC 3.38 (L) 3.87 - 5.11 MIL/uL   Hemoglobin 10.5 (L) 12.0 - 15.0 g/dL   HCT 30.6 (L) 36.0 - 46.0 %   MCV 90.5 78.0 - 100.0 fL   MCH 31.1 26.0 - 34.0 pg   MCHC 34.3 30.0 - 36.0 g/dL   RDW 15.3 11.5 - 15.5 %   Platelets 316 150 - 400 K/uL  C-reactive protein     Status: Abnormal   Collection Time: 07/30/17  8:00 AM  Result Value Ref Range    CRP 30.0 (H) <1.0 mg/dL    Comment: Performed at [facility], [place] [address] [place], [place] [address]  Sedimentation rate     Status: Abnormal   Collection Time: 07/30/17  8:00 AM  Result Value Ref Range   Sed Rate 127 (H) 0 - 22 mm/hr    MICRO: 8/6 aspriate pending IMAGING: Dg Fluoro Guided Needle Plc Aspiration/injection Loc  Result Date: 07/29/2017 CLINICAL DATA:  Left hip infection. Leukocytosis. Persistent fever. EXAM: LEFT HIP ARTHROCENTESIS UNDER FLUOROSCOPY FLUOROSCOPY TIME:  Fluoroscopy Time:  0 minutes, 38 seconds Radiation Exposure Index (if provided by the fluoroscopic device): 16 mGy Number of Acquired Spot Images: 0 PROCEDURE: I discussed the risks (including hemorrhage and infection, among others), benefits, and alternatives to the procedure with the patient. We specifically discussed the high technical likelihood of success of the procedure. The patient understood and elected to undergo the procedure. Standard time-out was employed and left side confirmed and marked. Following sterile skin prep and local anesthetic administration consisting of 1% lidocaine, a 20 gauge needle was advanced without difficulty along the lateral edge of the base of the neck of the left femoral component of the hip prosthesis under fluoroscopic guidance. A total of 23 cc of opaque cloudy orange fluid was aspirated. The needle was subsequently removed and the skin cleansed and bandaged. No immediate complications were observed. IMPRESSION: 1. Technically successful left hip aspiration under fluoroscopy, yielding 23 cc of opaque cloudy orange fluid which was sent for requested lab analysis. Electronically Signed   By: Van Clines M.D.   On: 07/29/2017 16:01    HISTORICAL MICRO/IMAGING  Assessment/Plan:  61yo F with well controlled hiv disease, hx of group b strep pji s/p 2 staged revision in 2017. Now admitted for  pyelonephritis but found to have likely reinfection of her left THA.  -  continue on ceftriaxone for now. - follow aspirate cx to decide how to narrow abtx - agree with need to do wash out - plan to do 6 wk iv abtx then convert to chronic suppression.   hiv disease = well managed. Continue on current regimen of tivicay, descovy, maraviroc

## 2017-07-30 NOTE — Progress Notes (Signed)
Patient ID: GALAXY Ashley Shepherd, female   DOB: [birth year], 61 y.o.   MRN: [id number]    Subjective: Denies flank pain.  Orthopedic notes reviewed.    Objective: Vital signs in last 24 hours: Temp:  [98.4 F (36.9 C)-100.4 F (38 C)] 100.4 F (38 C) (08/07 1506) Pulse Rate:  [68-88] 88 (08/07 1506) Resp:  [18] 18 (08/07 1506) BP: (119-147)/(62-73) 135/73 (08/07 1506) SpO2:  [98 %-100 %] 99 % (08/07 1506)  Intake/Output from previous day: 08/06 0701 - 08/07 0700 In: 1160 [P.O.:1060; IV Piggyback:100] Out: 1800 [Urine:1800] Intake/Output this shift: Total I/O In: 740 [P.O.:740] Out: 475 [Urine:475]  Physical Exam:  General: Alert and oriented Abd: No CVAT.  Lab Results:  Recent Labs  07/28/17 0445 07/29/17 1418 07/30/17 0552  HGB 10.9* 10.8* 10.5*  HCT 31.1* 30.7* 30.6*   CBC Latest Ref Rng & Units 07/30/2017 07/29/2017 07/28/2017  WBC 4.0 - 10.5 K/uL 16.3(H) 17.7(H) 14.9(H)  Hemoglobin 12.0 - 15.0 g/dL 10.5(L) 10.8(L) 10.9(L)  Hematocrit 36.0 - 46.0 % 30.6(L) 30.7(L) 31.1(L)  Platelets 150 - 400 K/uL 316 309 220    BMET  Recent Labs  07/30/17 0552  NA 140  K 3.4*  CL 102  CO2 28  GLUCOSE 110*  BUN 10  CREATININE 0.99  CALCIUM 9.0     Studies/Results:   Assessment/Plan: Pyelonephritis with left hydronephrosis:  I believe her pyelonephritis is resolving as expected.  Complete 10 days of culture specific antibiotics and then can stop therapy.  Left hydronephrosis is chronic and possible related to reflux.  She should follow up with her regular urologist, Dr. Thomasene Mohair, in [facility] for further evaluation.  Will sign off.  Please call if further questions.   LOS: 6 days   Chyanne Kohut,LES 07/30/2017, 5:07 PM

## 2017-07-31 DIAGNOSIS — Z96642 Presence of left artificial hip joint: Secondary | ICD-10-CM

## 2017-07-31 DIAGNOSIS — B20 Human immunodeficiency virus [HIV] disease: Secondary | ICD-10-CM

## 2017-07-31 DIAGNOSIS — A414 Sepsis due to anaerobes: Secondary | ICD-10-CM

## 2017-07-31 DIAGNOSIS — N12 Tubulo-interstitial nephritis, not specified as acute or chronic: Secondary | ICD-10-CM

## 2017-07-31 DIAGNOSIS — L02416 Cutaneous abscess of left lower limb: Secondary | ICD-10-CM

## 2017-07-31 DIAGNOSIS — N183 Chronic kidney disease, stage 3 (moderate): Secondary | ICD-10-CM

## 2017-07-31 LAB — BASIC METABOLIC PANEL
ANION GAP: 9 (ref 5–15)
BUN: 8 mg/dL (ref 6–20)
CHLORIDE: 102 mmol/L (ref 101–111)
CO2: 28 mmol/L (ref 22–32)
Calcium: 8.8 mg/dL — ABNORMAL LOW (ref 8.9–10.3)
Creatinine, Ser: 1 mg/dL (ref 0.44–1.00)
GFR, EST NON AFRICAN AMERICAN: 60 mL/min — AB (ref >60)
Glucose, Bld: 125 mg/dL — ABNORMAL HIGH (ref 65–99)
POTASSIUM: 3.6 mmol/L (ref 3.5–5.1)
SODIUM: 139 mmol/L (ref 135–145)

## 2017-07-31 LAB — CBC
HCT: 31.6 % — ABNORMAL LOW (ref 36.0–46.0)
Hemoglobin: 11 g/dL — ABNORMAL LOW (ref 12.0–15.0)
MCH: 31.9 pg (ref 26.0–34.0)
MCHC: 34.8 g/dL (ref 30.0–36.0)
MCV: 91.6 fL (ref 78.0–100.0)
PLATELETS: 348 10*3/uL (ref 150–400)
RBC: 3.45 MIL/uL — AB (ref 3.87–5.11)
RDW: 15.3 % (ref 11.5–15.5)
WBC: 16.3 10*3/uL — ABNORMAL HIGH (ref 4.0–10.5)

## 2017-07-31 LAB — MAGNESIUM: MAGNESIUM: 1.9 mg/dL (ref 1.7–2.4)

## 2017-07-31 MED ORDER — CEFAZOLIN SODIUM-DEXTROSE 2-4 GM/100ML-% IV SOLN
2.0000 g | Freq: Three times a day (TID) | INTRAVENOUS | Status: DC
  Administered 2017-07-31 – 2017-08-05 (×15): 2 g via INTRAVENOUS
  Filled 2017-07-31 (×16): qty 100

## 2017-07-31 MED ORDER — VANCOMYCIN HCL 10 G IV SOLR
1250.0000 mg | Freq: Two times a day (BID) | INTRAVENOUS | Status: DC
  Administered 2017-07-31 – 2017-08-02 (×5): 1250 mg via INTRAVENOUS
  Filled 2017-07-31 (×7): qty 1250

## 2017-07-31 NOTE — Progress Notes (Signed)
Pharmacy Antibiotic Note  Ashley Shepherd is a 61 y.o. female admitted on 07/23/2017 with PJI (hip) and Klebsiella pyelonephritis in setting of HIV disease.  Has been on Ceftriaxone since 7/31 yet leukocytosis persisting.  ID switching antibiotics to vancomycin and cefazolin.  Pharmacy has been consulted for vancomycin dosing.    Plan: Vancomycin 1250 mg IV q12h. F/u SCr and trough levels as indicated.  Height: 5' 10.5" (179.1 cm) Weight: 199 lb (90.3 kg) IBW/kg (Calculated) : 69.65  Temp (24hrs), Avg:99.1 F (37.3 C), Min:98.2 F (36.8 C), Max:99.9 F (37.7 C)   Recent Labs Lab 07/25/17 0534  07/27/17 0530 07/28/17 0445 07/29/17 1418 07/30/17 0552 07/31/17 0534  WBC 15.8*  < > 16.7* 14.9* 17.7* 16.3* 16.3*  CREATININE 1.26*  --  1.13*  --   --  0.99 1.00  < > = values in this interval not displayed.  Estimated Creatinine Clearance: 72.7 mL/min (by C-G formula based on SCr of 1 mg/dL).    Allergies  Allergen Reactions  . Codeine Itching  . Ivp Dye [Iodinated Diagnostic Agents] Itching    Pt stated that she had NO problem/reaction to topical iodine/betadine solution.  . Sulfa Antibiotics Itching    Antimicrobials this admission: 7/31 Ceftriaxone >> 8/8 8/8 Vancomycin >> 8/8 Cefazolin >>  Dose adjustments this admission:   Microbiology results: 7/31 BCx: NGF 7/31 UCx: Klebsiella pneumoniae 8/6 left hip synovial fluid cx: ngtd  Thank you for allowing pharmacy to be a part of this patient's care.  Hershal Coria 07/31/2017 5:43 PM

## 2017-07-31 NOTE — Progress Notes (Signed)
    [facility] for Infectious Disease    Date of Admission:  07/23/2017   Total days of antibiotics 6           ID: Ashley Shepherd is a 61 y.o. female with kleb pyelonephritis complicated by PJI Principal Problem:   Pyelonephritis Active Problems:   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[place])   HIV disease ([place])   CKD (chronic kidney disease) stage 3, GFR 30-59 ml/min   Depression with anxiety   Acute pyelonephritis   Klebsiella sepsis (HCC)   Abscess of left hip  Lab Results  Component Value Date   WBC 16.3 (H) 07/31/2017   HGB 11.0 (L) 07/31/2017   HCT 31.6 (L) 07/31/2017   MCV 91.6 07/31/2017   PLT 348 07/31/2017     Recent Labs  07/30/17 0800  ESRSEDRATE 127*   C-Reactive Protein  Recent Labs  07/30/17 0800  CRP 30.0*    Assessment/Plan: - will change abtx to cefazolin plus vancomycin to see if leukocytosis improve - likely needs wash out  [facility], [facility] for Infectious Diseases Cell: [phone] Pager: [phone]  07/31/2017, 5:15 PM

## 2017-07-31 NOTE — Progress Notes (Signed)
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 In the ED, patient was found to be febrile. UA positive for Hgb and culture showed (+) Klebsiella. CBC was notable for Cr 1.48 (1.36 a year ago) and leukocytosis. Lactic acid was also minimally elevated at 2.02. Patient was started on ceftriaxone for pyelonephritis. After persistent fevers, CT was performed of abdomen and pelvis and revealed fluid collection anterior to the L prosthetic hip. Fluid collection was aspirated and ID consulted for treatment of L prosthetic hip infection. Fluid culture 8/6 with gram (+) diplococci in clusters and pending final result.  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 -Sepsis at admission now resolved. 2/2 pyelonephritis and PJI. MRSA precautions. -Continue ceftriaxone. Follow cultures. -Monitor for fever.  Pyelonephritis with Left Hydronephrosis and Associated Leukocytosis  -07/25/17 CT showing nonobstructive renal calculi, mild b/l hydronephrosis, fluid collection anterior to L hip prosthesis. Urine with (+) Klebsiella. Blood cultures (-). -Leukocytosis -8/8 WBC at 16.3. -Urology consulted - hydronephrosis appears to be chronic finding based upon being intermittently demonstrated similar findings on prior scans. Possible reflux. F/u suggested as OP for further eval. -Continue Ceftriaxone.  Left hip fluid collection / prosthetic joint  infection -CT / L hip aspiration as above. 8/6 IR aspiration of L hip with gram stain showing GPC chains/clusters. Previous history of GBS hip culture (01/24/16). CRP 30.0/ ESR 127. Reconsulted ortho. ID consulted and agrees with plan for wash out. Plan for 6 wk abx tx then convert to chronic supression. -Follow aspirate cx to narrow abx tx. -Continue Ceftriaxone.  HIV/AIDS -Well managed & Followed by ID at [facility]. Appt August 2018 for follow-up. -VL <20, CD4 478 in February 2018. -Continue Descovy, [place], [place].  CKD, Stage 3 -Baseline Cr 1.1-1.4; (8/8) Cr 1.00. -Baseline Hgb ~11.0. Continue ferrous sulfate tablet. -Monitor - CBC, BMP.  Hypokalemia - (8/8) K+ 3.6; Mg 1.9. -Continue to replete K+ with goal 4.0. -Monitor K+ / Mg+ - BMP.  Depression / Anxiety -Continue home meds.   DVT prophylaxis:[place] Heparin Code Status: Full Family Communication: Spouse Disposition Plan: Home once stable   Consultants:   Orthopedics - [place]  Urology - Alinda Money  ID Baxter Flattery  Procedures:   Left hip join aspiration  Antimicrobials:  Ceftriaxone 7/31>>>>  Subjective: Patient states that she woke once last night with bad hip pain but otherwise reports improved hip pain, rated 3/10. No reported back pain. Able to ambulate to bathroom. Denies n/v/d. Reports mild dysuria, improved since admission. Last bowel movement yesterday. Also reports healthy appetite and desire to advance diet.   Objective: Vitals:   07/30/17 0620 07/30/17 1506 07/30/17 2234 07/31/17 0516  BP: 119/62 135/73 (!) 125/58 (!) 128/53  Pulse: 68 88 73 84  Resp: 18 18 18 18   Temp: 98.4 F (36.9 C) (!) 100.4 F (38 C) 98.2 F (36.8 C) 99.9 F (37.7 C)  TempSrc: Oral Oral Oral Oral  SpO2: 98% 99%  99% 99%  Weight:      Height:        Intake/Output Summary (Last 24 hours) at 07/31/17 1338 Last data filed at 07/31/17 1255  Gross per 24 hour  Intake             2250 ml  Output             1276 ml  Net               974 ml   Filed Weights   07/23/17 1234  Weight: 90.3 kg (199 lb)    Examination:  Vitals:   07/30/17 0620 07/30/17 1506 07/30/17 2234 07/31/17 0516  BP: 119/62 135/73 (!) 125/58 (!) 128/53  Pulse: 68 88 73 84  Resp: 18 18 18 18   Temp: 98.4 F (36.9 C) (!) 100.4 F (38 C) 98.2 F (36.8 C) 99.9 F (37.7 C)  TempSrc: Oral Oral Oral Oral  SpO2: 98% 99% 99% 99%  Weight:      Height:        Constitutional: NAD Eyes: PERRL, lids and conjunctivae normal ENMT: Mucous membranes are moist. Neck: normal, supple, no masses Respiratory: clear to auscultation bilaterally. Normal respiratory effort. No accessory muscle use.  Cardiovascular:RRR, no murmurs / rubs / gallops. No LE edema. 2+ pedal pulses.  Abdomen: no tenderness. +BS Musculoskeletal: no clubbing / cyanosis. No joint deformity upper and lower extremities. No contractures. Normal muscle tone.  Skin: no rashes, lesions, ulcers. No induration Psychiatric: Normal judgment and insight. Normal mood.    Data Reviewed: I have independently reviewed following labs and imaging studies   CBC:  Recent Labs Lab 07/27/17 0530 07/28/17 0445 07/29/17 1418 07/30/17 0552 07/31/17 0534  WBC 16.7* 14.9* 17.7* 16.3* 16.3*  HGB 11.0* 10.9* 10.8* 10.5* 11.0*  HCT 30.6* 31.1* 30.7* 30.6* 31.6*  MCV 90.8 89.6 91.1 90.5 91.6  PLT 191 220 309 316 765   Basic Metabolic Panel:  Recent Labs Lab 07/25/17 0534 07/27/17 0530 07/30/17 0552 07/31/17 0534  NA 138 141 140 139  K 3.6 3.9 3.4* 3.6  CL 109 108 102 102  CO2 21* 24 28 28   GLUCOSE 125* 108* 110* 125*  BUN 11 10 10 8   CREATININE 1.26* 1.13* 0.99 1.00  CALCIUM 8.6* 8.9 9.0 8.8*  MG  --   --   --  1.9   GFR: Estimated Creatinine Clearance: 72.7 mL/min (by C-G formula based on SCr of 1 mg/dL). Liver Function Tests: No results for input(s): AST, ALT, ALKPHOS, BILITOT, PROT, ALBUMIN in the last 168 hours. No results for input(s): LIPASE, AMYLASE in the last 168  hours. No results for input(s): AMMONIA in the last 168 hours. Coagulation Profile: No results for input(s): INR, PROTIME in the last 168 hours. Cardiac Enzymes: No results for input(s): CKTOTAL, CKMB, CKMBINDEX, TROPONINI in the last 168 hours. BNP (last 3 results) No results for input(s): PROBNP in the last 8760 hours. HbA1C: No results for input(s): HGBA1C in the last 72 hours. CBG:  Recent Labs Lab 07/24/17 1709  GLUCAP 128*   Lipid Profile: No results for input(s): CHOL, HDL, LDLCALC, TRIG, CHOLHDL, LDLDIRECT in the last 72 hours. Thyroid Function Tests: No results for input(s): TSH, T4TOTAL, FREET4, T3FREE, THYROIDAB in the last 72 hours. Anemia Panel: No results for input(s): VITAMINB12, FOLATE, FERRITIN, TIBC, IRON, RETICCTPCT in the last 72 hours. Urine analysis:    Component Value Date/Time   COLORURINE AMBER (A) 07/23/2017 1350   APPEARANCEUR CLOUDY (A) 07/23/2017  1350   LABSPEC 1.018 07/23/2017 1350   PHURINE 5.5 07/23/2017 1350   GLUCOSEU NEGATIVE 07/23/2017 1350   HGBUR SMALL (A) 07/23/2017 1350   BILIRUBINUR NEGATIVE 07/23/2017 1350   KETONESUR NEGATIVE 07/23/2017 1350   PROTEINUR 30 (A) 07/23/2017 1350   UROBILINOGEN 0.2 09/23/2014 1056   NITRITE NEGATIVE 07/23/2017 1350   LEUKOCYTESUR LARGE (A) 07/23/2017 1350   Sepsis Labs: Invalid input(s): PROCALCITONIN, LACTICIDVEN  Recent Results (from the past 240 hour(s))  Culture, blood (Routine x 2)     Status: None   Collection Time: 07/23/17  1:20 PM  Result Value Ref Range Status   Specimen Description BLOOD BLOOD LEFT FOREARM  Final   Special Requests   Final    BOTTLES DRAWN AEROBIC AND ANAEROBIC Blood Culture results may not be optimal due to an inadequate volume of blood received in culture bottles   Culture   Final    NO GROWTH 5 DAYS Performed at [facility], [place] [address] [place], [place] [address]    Report Status 07/28/2017 FINAL  Final  Culture, Urine     Status: Abnormal    Collection Time: 07/23/17  1:50 PM  Result Value Ref Range Status   Specimen Description URINE, RANDOM  Final   Special Requests NONE  Final   Culture >=100,000 COLONIES/mL KLEBSIELLA PNEUMONIAE (A)  Final   Report Status 07/25/2017 FINAL  Final   Organism ID, Bacteria KLEBSIELLA PNEUMONIAE (A)  Final      Susceptibility   Klebsiella pneumoniae - MIC*    AMPICILLIN >=32 RESISTANT Resistant     CEFAZOLIN <=4 SENSITIVE Sensitive     CEFTRIAXONE <=1 SENSITIVE Sensitive     CIPROFLOXACIN <=0.25 SENSITIVE Sensitive     GENTAMICIN <=1 SENSITIVE Sensitive     IMIPENEM 0.5 SENSITIVE Sensitive     NITROFURANTOIN 128 RESISTANT Resistant     TRIMETH/SULFA <=20 SENSITIVE Sensitive     AMPICILLIN/SULBACTAM 4 SENSITIVE Sensitive     PIP/TAZO <=4 SENSITIVE Sensitive     Extended ESBL NEGATIVE Sensitive     * >=100,000 COLONIES/mL KLEBSIELLA PNEUMONIAE  Culture, blood (Routine x 2)     Status: None   Collection Time: 07/23/17  2:10 PM  Result Value Ref Range Status   Specimen Description BLOOD RIGHT ANTECUBITAL  Final   Special Requests   Final    BOTTLES DRAWN AEROBIC AND ANAEROBIC Blood Culture adequate volume   Culture   Final    NO GROWTH 5 DAYS Performed at [facility], [address] [place], [place] [address]    Report Status 07/28/2017 FINAL  Final  MRSA PCR Screening     Status: Abnormal   Collection Time: 07/23/17  6:48 PM  Result Value Ref Range Status   MRSA by PCR POSITIVE (A) NEGATIVE Final    Comment:        The GeneXpert MRSA Assay (FDA approved for NASAL specimens only), is one component of a comprehensive MRSA colonization surveillance program. It is not intended to diagnose MRSA infection nor to guide or monitor treatment for MRSA infections. RESULT CALLED TO, READ BACK BY AND VERIFIED WITH: Charleen Kirks [id number] @ 1478 BY J SCOTTON   Body fluid culture     Status: None (Preliminary result)   Collection Time: 07/29/17  3:30 PM  Result Value Ref Range  Status   Specimen Description SYNOVIAL LEFT HIP  Final   Special Requests NONE  Final   Gram Stain   Final  ABUNDANT WBC PRESENT,BOTH PMN AND MONONUCLEAR FEW GRAM POSITIVE COCCI IN PAIRS IN CLUSTERS    Culture   Final    NO GROWTH 2 DAYS Performed at [facility], [place] [address] [place], [place] [address]    Report Status PENDING  Incomplete      Radiology Studies: Dg Fluoro Guided Needle Plc Aspiration/injection Loc  Result Date: 07/29/2017 CLINICAL DATA:  Left hip infection. Leukocytosis. Persistent fever. EXAM: LEFT HIP ARTHROCENTESIS UNDER FLUOROSCOPY FLUOROSCOPY TIME:  Fluoroscopy Time:  0 minutes, 38 seconds Radiation Exposure Index (if provided by the fluoroscopic device): 16 mGy Number of Acquired Spot Images: 0 PROCEDURE: I discussed the risks (including hemorrhage and infection, among others), benefits, and alternatives to the procedure with the patient. We specifically discussed the high technical likelihood of success of the procedure. The patient understood and elected to undergo the procedure. Standard time-out was employed and left side confirmed and marked. Following sterile skin prep and local anesthetic administration consisting of 1% lidocaine, a 20 gauge needle was advanced without difficulty along the lateral edge of the base of the neck of the left femoral component of the hip prosthesis under fluoroscopic guidance. A total of 23 cc of opaque cloudy orange fluid was aspirated. The needle was subsequently removed and the skin cleansed and bandaged. No immediate complications were observed. IMPRESSION: 1. Technically successful left hip aspiration under fluoroscopy, yielding 23 cc of opaque cloudy orange fluid which was sent for requested lab analysis. Electronically Signed   By: Van Clines M.D.   On: 07/29/2017 16:01     Scheduled Meds: . aspirin EC  81 mg Oral Daily  . citalopram  20 mg Oral q morning - 10a  . dolutegravir  50 mg Oral BID  .  emtricitabine-tenofovir AF  1 tablet Oral Daily  . ferrous sulfate  325 mg Oral BID  . heparin  5,000 Units Subcutaneous Q8H  . maraviroc  300 mg Oral BID  . multivitamin with minerals  1 tablet Oral Daily  . pantoprazole  40 mg Oral Daily   Continuous Infusions: . cefTRIAXone (ROCEPHIN)  IV Stopped (07/30/17 1750)    Marrianne Mood, Student-PA  07/31/2017 1:38 PM

## 2017-08-01 ENCOUNTER — Other Ambulatory Visit (INDEPENDENT_AMBULATORY_CARE_PROVIDER_SITE_OTHER): Payer: Self-pay | Admitting: Physician Assistant

## 2017-08-01 LAB — BASIC METABOLIC PANEL
ANION GAP: 10 (ref 5–15)
BUN: 7 mg/dL (ref 6–20)
CHLORIDE: 100 mmol/L — AB (ref 101–111)
CO2: 27 mmol/L (ref 22–32)
CREATININE: 1.05 mg/dL — AB (ref 0.44–1.00)
Calcium: 9 mg/dL (ref 8.9–10.3)
GFR calc non Af Amer: 56 mL/min — ABNORMAL LOW (ref >60)
Glucose, Bld: 126 mg/dL — ABNORMAL HIGH (ref 65–99)
POTASSIUM: 3.8 mmol/L (ref 3.5–5.1)
SODIUM: 137 mmol/L (ref 135–145)

## 2017-08-01 LAB — CBC
HCT: 31.3 % — ABNORMAL LOW (ref 36.0–46.0)
HEMOGLOBIN: 10.6 g/dL — AB (ref 12.0–15.0)
MCH: 31.3 pg (ref 26.0–34.0)
MCHC: 33.9 g/dL (ref 30.0–36.0)
MCV: 92.3 fL (ref 78.0–100.0)
Platelets: 380 10*3/uL (ref 150–400)
RBC: 3.39 MIL/uL — AB (ref 3.87–5.11)
RDW: 15.5 % (ref 11.5–15.5)
WBC: 16.6 10*3/uL — AB (ref 4.0–10.5)

## 2017-08-01 LAB — MRSA PCR SCREENING: MRSA BY PCR: POSITIVE — AB

## 2017-08-01 MED ORDER — CHLORHEXIDINE GLUCONATE CLOTH 2 % EX PADS
6.0000 | MEDICATED_PAD | Freq: Every day | CUTANEOUS | Status: AC
  Administered 2017-08-01 – 2017-08-05 (×5): 6 via TOPICAL

## 2017-08-01 MED ORDER — MUPIROCIN 2 % EX OINT
1.0000 "application " | TOPICAL_OINTMENT | Freq: Two times a day (BID) | CUTANEOUS | Status: DC
  Administered 2017-08-01 – 2017-08-05 (×8): 1 via NASAL

## 2017-08-01 NOTE — Progress Notes (Signed)
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 In the ED, patient was found to be febrile. UA positive for Hgb and Cx (+) Klebsiella. Other labs notable for leukocytosis and minimally elevated lactic acid at 2.02. Patient started on ceftriaxone for pyelonephritis. After persistent fevers, CT was performed of abdomen and pelvis and revealed fluid collection anterior to the L prosthetic hip. Fluid collection was aspirated and ID consulted for treatment of L prosthetic hip infection. Fluid culture 8/6 with gram (+) diplococci in clusters, no growth X5 days, and pending final result.  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 -Sepsis at admission now resolved. 2/2 pyelonephritis vs. PJI. MRSA precautions. -Antimicrobials currently are vancomycin and cefazolin. Continue. -Monitor for fever.  Pyelonephritis with Left Hydronephrosis and Associated Leukocytosis  -07/25/17 CT showing nonobstructive renal calculi, mild b/l hydronephrosis, fluid collection anterior to L hip prosthesis. Urine with (+) Klebsiella.  -Leukocytosis -8/8 WBC at 16.3. -Urology consulted - hydronephrosis appears chronic based on similar findings on prior scans. Possible reflux. F/u suggested as OP for further eval. -Continue vancomycin and cefazolin.  Left hip  fluid collection / prosthetic joint infection -CT / (8/6) L hip aspiration with gram stain showing GPC chains/clusters. Previous history of GBS hip culture (01/24/16). ID and ortho consulted. Patient scheduled today (8/9) for L hip  irrigation and debridement and possible L hip excision of hardware and placement of abx spacer. Patient currently on abx with treatment per infectious disease, likely 6 wk abx tx then convert to chronic supression.  HIV/AIDS -Well managed & Followed by ID at [facility]. Appt August 2018 for follow-up. -VL <20, CD4 478 in February 2018. -Continue Descovy, [place], [place].  CKD, Stage 3 -Baseline Cr 1.1-1.4; (8/9) Cr 1.02. -Baseline Hgb ~11.0. Continue ferrous sulfate tablet. -Monitor - CBC, BMP.  Hypokalemia -(8/9) K+ 3.8; (8/8) Mg 1.9. -Continue to replete K+ with goal 4.0.  Depression / Anxiety -Continue home meds.  DVT prophylaxis:[place] Heparin Code Status: Full Family Communication: Spouse Disposition Plan: Home once stable   Consultants:   Orthopedics - [place]  Urology - Alinda Money  ID Baxter Flattery  Procedures:   Left hip join aspiration  Antimicrobials:  Ceftriaxone 7/31-8/7  Cefazolin 8/8 >>>>  Vancomycin 8/8 >>>>  Subjective: Patient states that she is nervous about today's procedure, because she does not want to need hardware replacement. She states that she slept well last night and is tolerating her advanced diet. She has not experienced any recent dysuria or back pain, n/v/d/f/c. Her last bowel movement was this morning. She notes recently dark stool.   Objective: Vitals:   07/31/17 0516 07/31/17 1441 07/31/17 2120 08/01/17 0611  BP: (!) 128/53 (!) 114/55 122/67 133/78  Pulse: 84 69 86 83  Resp: 18 16 18 18   Temp: 99.9 F (37.7 C) 98.7 F (37.1 C) 99.3  F (37.4 C) 99.3 F (37.4 C)  TempSrc: Oral Oral Oral Oral  SpO2: 99% 99% 97% 100%  Weight:      Height:        Intake/Output Summary (Last 24 hours) at 08/01/17 1046 Last  data filed at 08/01/17 1004  Gross per 24 hour  Intake             1430 ml  Output              350 ml  Net             1080 ml   Filed Weights   07/23/17 1234  Weight: 90.3 kg (199 lb)    Examination:  Vitals:   07/31/17 0516 07/31/17 1441 07/31/17 2120 08/01/17 0611  BP: (!) 128/53 (!) 114/55 122/67 133/78  Pulse: 84 69 86 83  Resp: 18 16 18 18   Temp: 99.9 F (37.7 C) 98.7 F (37.1 C) 99.3 F (37.4 C) 99.3 F (37.4 C)  TempSrc: Oral Oral Oral Oral  SpO2: 99% 99% 97% 100%  Weight:      Height:        Constitutional: NAD Eyes: PERRL, lids and conjunctivae normal ENMT: Mucous membranes are moist. Neck: normal, supple, no masses Respiratory: clear to auscultation bilaterally. Normal respiratory effort. No accessory muscle use.  Cardiovascular:RRR, no murmurs / rubs / gallops. No LE edema. 2+ pedal pulses.  Abdomen: no tenderness. Hypoactive bowel sounds despite LBM today. Musculoskeletal: no clubbing / cyanosis. No joint deformity upper and lower extremities. No contractures. Normal muscle tone.  Skin: no rashes, lesions, ulcers. No induration Psychiatric: Normal judgment and insight. Normal mood.    Data Reviewed: I have independently reviewed following labs and imaging studies   CBC:  Recent Labs Lab 07/28/17 0445 07/29/17 1418 07/30/17 0552 07/31/17 0534 08/01/17 0530  WBC 14.9* 17.7* 16.3* 16.3* 16.6*  HGB 10.9* 10.8* 10.5* 11.0* 10.6*  HCT 31.1* 30.7* 30.6* 31.6* 31.3*  MCV 89.6 91.1 90.5 91.6 92.3  PLT 220 309 316 348 409   Basic Metabolic Panel:  Recent Labs Lab 07/27/17 0530 07/30/17 0552 07/31/17 0534 08/01/17 0530  NA 141 140 139 137  K 3.9 3.4* 3.6 3.8  CL 108 102 102 100*  CO2 24 28 28 27   GLUCOSE 108* 110* 125* 126*  BUN 10 10 8 7   CREATININE 1.13* 0.99 1.00 1.05*  CALCIUM 8.9 9.0 8.8* 9.0  MG  --   --  1.9  --    GFR: Estimated Creatinine Clearance: 69.2 mL/min (A) (by C-G formula based on SCr of 1.05 mg/dL (H)). Liver Function  Tests: No results for input(s): AST, ALT, ALKPHOS, BILITOT, PROT, ALBUMIN in the last 168 hours. No results for input(s): LIPASE, AMYLASE in the last 168 hours. No results for input(s): AMMONIA in the last 168 hours. Coagulation Profile: No results for input(s): INR, PROTIME in the last 168 hours. Cardiac Enzymes: No results for input(s): CKTOTAL, CKMB, CKMBINDEX, TROPONINI in the last 168 hours. BNP (last 3 results) No results for input(s): PROBNP in the last 8760 hours. HbA1C: No results for input(s): HGBA1C in the last 72 hours. CBG: No results for input(s): GLUCAP in the last 168 hours. Lipid Profile: No results for input(s): CHOL, HDL, LDLCALC, TRIG, CHOLHDL, LDLDIRECT in the last 72 hours. Thyroid Function Tests: No results for input(s): TSH, T4TOTAL, FREET4, T3FREE, THYROIDAB in the last 72 hours. Anemia Panel: No results for input(s): VITAMINB12, FOLATE, FERRITIN, TIBC, IRON, RETICCTPCT in the last 72  hours. Urine analysis:    Component Value Date/Time   COLORURINE AMBER (A) 07/23/2017 1350   APPEARANCEUR CLOUDY (A) 07/23/2017 1350   LABSPEC 1.018 07/23/2017 1350   PHURINE 5.5 07/23/2017 1350   GLUCOSEU NEGATIVE 07/23/2017 1350   HGBUR SMALL (A) 07/23/2017 1350   BILIRUBINUR NEGATIVE 07/23/2017 1350   KETONESUR NEGATIVE 07/23/2017 1350   PROTEINUR 30 (A) 07/23/2017 1350   UROBILINOGEN 0.2 09/23/2014 1056   NITRITE NEGATIVE 07/23/2017 1350   LEUKOCYTESUR LARGE (A) 07/23/2017 1350   Sepsis Labs: Invalid input(s): PROCALCITONIN, LACTICIDVEN  Recent Results (from the past 240 hour(s))  Culture, blood (Routine x 2)     Status: None   Collection Time: 07/23/17  1:20 PM  Result Value Ref Range Status   Specimen Description BLOOD BLOOD LEFT FOREARM  Final   Special Requests   Final    BOTTLES DRAWN AEROBIC AND ANAEROBIC Blood Culture results may not be optimal due to an inadequate volume of blood received in culture bottles   Culture   Final    NO GROWTH 5  DAYS Performed at [facility], [place] [address], [place], [place] [address]    Report Status 07/28/2017 FINAL  Final  Culture, Urine     Status: Abnormal   Collection Time: 07/23/17  1:50 PM  Result Value Ref Range Status   Specimen Description URINE, RANDOM  Final   Special Requests NONE  Final   Culture >=100,000 COLONIES/mL KLEBSIELLA PNEUMONIAE (A)  Final   Report Status 07/25/2017 FINAL  Final   Organism ID, Bacteria KLEBSIELLA PNEUMONIAE (A)  Final      Susceptibility   Klebsiella pneumoniae - MIC*    AMPICILLIN >=32 RESISTANT Resistant     CEFAZOLIN <=4 SENSITIVE Sensitive     CEFTRIAXONE <=1 SENSITIVE Sensitive     CIPROFLOXACIN <=0.25 SENSITIVE Sensitive     GENTAMICIN <=1 SENSITIVE Sensitive     IMIPENEM 0.5 SENSITIVE Sensitive     NITROFURANTOIN 128 RESISTANT Resistant     TRIMETH/SULFA <=20 SENSITIVE Sensitive     AMPICILLIN/SULBACTAM 4 SENSITIVE Sensitive     PIP/TAZO <=4 SENSITIVE Sensitive     Extended ESBL NEGATIVE Sensitive     * >=100,000 COLONIES/mL KLEBSIELLA PNEUMONIAE  Culture, blood (Routine x 2)     Status: None   Collection Time: 07/23/17  2:10 PM  Result Value Ref Range Status   Specimen Description BLOOD RIGHT ANTECUBITAL  Final   Special Requests   Final    BOTTLES DRAWN AEROBIC AND ANAEROBIC Blood Culture adequate volume   Culture   Final    NO GROWTH 5 DAYS Performed at [facility], [address] [place], [place] [address]    Report Status 07/28/2017 FINAL  Final  MRSA PCR Screening     Status: Abnormal   Collection Time: 07/23/17  6:48 PM  Result Value Ref Range Status   MRSA by PCR POSITIVE (A) NEGATIVE Final    Comment:        The GeneXpert MRSA Assay (FDA approved for NASAL specimens only), is one component of a comprehensive MRSA colonization surveillance program. It is not intended to diagnose MRSA infection nor to guide or monitor treatment for MRSA infections. RESULT CALLED TO, READ BACK BY AND VERIFIED  WITH: Charleen Kirks [id number] @ 5631 BY J SCOTTON   Body fluid culture     Status: None (Preliminary result)   Collection Time: 07/29/17  3:30 PM  Result Value Ref Range Status   Specimen Description  SYNOVIAL LEFT HIP  Final   Special Requests NONE  Final   Gram Stain   Final    ABUNDANT WBC PRESENT,BOTH PMN AND MONONUCLEAR FEW GRAM POSITIVE COCCI IN PAIRS IN CLUSTERS    Culture   Final    NO GROWTH 3 DAYS Performed at [facility], [place] [address], [place], [place] [address]    Report Status PENDING  Incomplete  MRSA PCR Screening     Status: Abnormal   Collection Time: 07/31/17 10:25 PM  Result Value Ref Range Status   MRSA by PCR POSITIVE (A) NEGATIVE Final    Comment:        The GeneXpert MRSA Assay (FDA approved for NASAL specimens only), is one component of a comprehensive MRSA colonization surveillance program. It is not intended to diagnose MRSA infection nor to guide or monitor treatment for MRSA infections. RESULT CALLED TO, READ BACK BY AND VERIFIED WITH: ONEILL,M RN 8.9.18 @0026  ZANDO,C       Radiology Studies: No results found.   Scheduled Meds: . aspirin EC  81 mg Oral Daily  . Chlorhexidine Gluconate Cloth  6 each Topical Q0600  . citalopram  20 mg Oral q morning - 10a  . dolutegravir  50 mg Oral BID  . emtricitabine-tenofovir AF  1 tablet Oral Daily  . ferrous sulfate  325 mg Oral BID  . heparin  5,000 Units Subcutaneous Q8H  . maraviroc  300 mg Oral BID  . multivitamin with minerals  1 tablet Oral Daily  . mupirocin ointment  1 application Nasal BID  . pantoprazole  40 mg Oral Daily   Continuous Infusions: .  ceFAZolin (ANCEF) IV 2 g (08/01/17 0615)  . vancomycin 1,250 mg (08/01/17 0732)    Marrianne Mood, Student-PA  08/01/2017 10:46 AM

## 2017-08-01 NOTE — Progress Notes (Signed)
[name] will follow [name] with ID team to support IV ABX for home at DC if  ordered.  If patient discharges after hours, please call [phone].   Ashley Shepherd 08/01/2017, 2:43 PM

## 2017-08-01 NOTE — Progress Notes (Signed)
    [facility] for Infectious Disease    Date of Admission:  07/23/2017   Total days of antibiotics 10   ID: Ashley Shepherd is a 61 y.o. 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 aspirin EC  81 mg Oral Daily  . Chlorhexidine Gluconate Cloth  6 each Topical Q0600  . citalopram  20 mg Oral q morning - 10a  . dolutegravir  50 mg Oral BID  . emtricitabine-tenofovir AF  1 tablet Oral Daily  . ferrous sulfate  325 mg Oral BID  . heparin  5,000 Units Subcutaneous Q8H  . maraviroc  300 mg Oral BID  . multivitamin with minerals  1 tablet Oral Daily  . mupirocin ointment  1 application Nasal BID  . pantoprazole  40 mg Oral Daily    Objective: Vital signs in last 24 hours: Temp:  [99.3 F (37.4 C)-99.6 F (37.6 C)] 99.6 F (37.6 C) (08/09 1444) Pulse Rate:  [77-86] 77 (08/09 1444) Resp:  [18] 18 (08/09 1444) BP: (122-133)/(60-78) 128/60 (08/09 1444) SpO2:  [97 %-100 %] 100 % (08/09 1444) Physical Exam  Constitutional:  oriented to person, place, and time. appears well-developed and well-nourished. No distress.  HENT: [place]/AT, PERRLA, no scleral icterus Mouth/Throat: Oropharynx is clear and moist. No oropharyngeal exudate.  Cardiovascular: Normal rate, regular rhythm and normal heart sounds. Exam reveals no gallop and no friction rub.  No murmur heard.  Pulmonary/Chest: Effort normal and breath sounds normal. No respiratory distress.  has no wheezes.  Neck = supple, no nuchal rigidity Abdominal: Soft. Bowel sounds are normal.  exhibits no distension. There is no tenderness.  Lymphadenopathy: no cervical adenopathy. No axillary  adenopathy Neurological: alert and oriented to person, place, and time.  Skin: Skin is warm and dry. No rash noted. No erythema.  Psychiatric: a normal mood and affect.  behavior is normal.   Lab Results  Recent Labs  07/31/17 0534 08/01/17 0530  WBC 16.3* 16.6*  HGB 11.0* 10.6*  HCT 31.6* 31.3*  NA 139 137  K 3.6 3.8  CL 102 100*  CO2 28 27  BUN 8 7  CREATININE 1.00 1.05*   Sedimentation Rate  Recent Labs  07/30/17 0800  ESRSEDRATE 127*   C-Reactive Protein  Recent Labs  07/30/17 0800  CRP 30.0*    Microbiology: 8/6 aspirate still ngtd but gram stain showing gpc  Studies/Results: No results found.   Assessment/Plan: pji = continue on vancomycin and cefazolin. Would also like to have more specimen sent from the OR. Plan for 6 wk of tx. Added vanco since she is mrsa colonized  [facility] [place] = treated with cefazolin  hiv disease = well controlled. Continue on current regimen  Leukocytosis = still needs source control on pji of hip  Nefertari Rebman, [facility] for Infectious Diseases Cell: [phone] Pager: [phone]  08/01/2017, 5:18 PM

## 2017-08-01 NOTE — Consult Note (Signed)
  Mrs. 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 Recently admitted for Klebsiella pyelonephritis and left hip pain. Left hip abscess was aspirated and showed a few gram-positive cocci in pairs and clusters but no growth to date. Patient has received antibiotics during this hospital course is currently on vancomycin and cefazolin. She is responded well with to these antibiotics and is now having minimal hip pain.  Physical exam: General well-developed well-nourished female in no acute distress. Left hip: Good range of motion of the hip without pain. She is able to ambulate without any assistive devices no pain.  Assessment/plan: Left hip abscess with gram-positive cocci/no growth to date history of prior strep left total hip with excision hardware placement revision hip 6 weeks. Orthotics. Recommend at least irrigation and debridement of left hip and possible left hip excision of hardware and placement of antibiotic spacer. This really would like to proceed with this procedure and therefore we will place her on the operating schedule for the above procedure tomorrow. Questions were encouraged and answered length today.

## 2017-08-02 ENCOUNTER — Inpatient Hospital Stay (HOSPITAL_COMMUNITY): Payer: Medicare Other | Admitting: Anesthesiology

## 2017-08-02 ENCOUNTER — Inpatient Hospital Stay (HOSPITAL_COMMUNITY): Payer: Medicare Other

## 2017-08-02 ENCOUNTER — Encounter (HOSPITAL_COMMUNITY)
Admission: EM | Disposition: A | Discharge status: Home Health | Payer: Self-pay | Source: Home / Self Care | Attending: Internal Medicine

## 2017-08-02 DIAGNOSIS — T8451XA Infection and inflammatory reaction due to internal right hip prosthesis, initial encounter: Secondary | ICD-10-CM

## 2017-08-02 HISTORY — PX: ANTERIOR HIP REVISION: SHX6527

## 2017-08-02 LAB — BODY FLUID CULTURE: CULTURE: NO GROWTH

## 2017-08-02 LAB — CREATININE, SERUM
CREATININE: 1.01 mg/dL — AB (ref 0.44–1.00)
GFR, EST NON AFRICAN AMERICAN: 59 mL/min — AB (ref >60)

## 2017-08-02 LAB — HEMOGLOBIN AND HEMATOCRIT, BLOOD
HCT: 26 % — ABNORMAL LOW (ref 36.0–46.0)
HEMOGLOBIN: 8.9 g/dL — AB (ref 12.0–15.0)

## 2017-08-02 LAB — TYPE AND SCREEN
ABO/RH(D): O POS
Antibody Screen: NEGATIVE

## 2017-08-02 SURGERY — REVISION, TOTAL ARTHROPLASTY, HIP, ANTERIOR APPROACH
Anesthesia: General | Site: Hip | Laterality: Left

## 2017-08-02 MED ORDER — SODIUM CHLORIDE 0.9 % IR SOLN
Status: DC | PRN
  Administered 2017-08-02: 1000 mL
  Administered 2017-08-02: 3000 mL

## 2017-08-02 MED ORDER — PHENYLEPHRINE HCL 10 MG/ML IJ SOLN
INTRAMUSCULAR | Status: DC | PRN
  Administered 2017-08-02 (×2): 25 ug/min via INTRAVENOUS

## 2017-08-02 MED ORDER — LIDOCAINE 2% (20 MG/ML) 5 ML SYRINGE
INTRAMUSCULAR | Status: DC | PRN
  Administered 2017-08-02: 75 mg via INTRAVENOUS
  Administered 2017-08-02: 25 mg via INTRAVENOUS

## 2017-08-02 MED ORDER — FENTANYL CITRATE (PF) 100 MCG/2ML IJ SOLN
INTRAMUSCULAR | Status: AC
  Administered 2017-08-02: 50 ug via INTRAVENOUS
  Filled 2017-08-02: qty 2

## 2017-08-02 MED ORDER — ALBUMIN HUMAN 5 % IV SOLN
INTRAVENOUS | Status: DC | PRN
  Administered 2017-08-02: 16:00:00 via INTRAVENOUS

## 2017-08-02 MED ORDER — OXYCODONE HCL 5 MG PO TABS
5.0000 mg | ORAL_TABLET | ORAL | Status: DC | PRN
  Administered 2017-08-02: 10 mg via ORAL
  Filled 2017-08-02: qty 2

## 2017-08-02 MED ORDER — FENTANYL CITRATE (PF) 100 MCG/2ML IJ SOLN
INTRAMUSCULAR | Status: DC | PRN
  Administered 2017-08-02: 100 ug via INTRAVENOUS
  Administered 2017-08-02 (×2): 50 ug via INTRAVENOUS
  Administered 2017-08-02: 100 ug via INTRAVENOUS
  Administered 2017-08-02: 50 ug via INTRAVENOUS

## 2017-08-02 MED ORDER — SUGAMMADEX SODIUM 200 MG/2ML IV SOLN
INTRAVENOUS | Status: AC
  Filled 2017-08-02: qty 2

## 2017-08-02 MED ORDER — ONDANSETRON HCL 4 MG/2ML IJ SOLN
INTRAMUSCULAR | Status: AC
  Filled 2017-08-02: qty 2

## 2017-08-02 MED ORDER — FENTANYL CITRATE (PF) 250 MCG/5ML IJ SOLN
INTRAMUSCULAR | Status: AC
  Filled 2017-08-02: qty 5

## 2017-08-02 MED ORDER — SUGAMMADEX SODIUM 200 MG/2ML IV SOLN
INTRAVENOUS | Status: DC | PRN
  Administered 2017-08-02: 200 mg via INTRAVENOUS

## 2017-08-02 MED ORDER — FENTANYL CITRATE (PF) 100 MCG/2ML IJ SOLN
INTRAMUSCULAR | Status: AC
  Filled 2017-08-02: qty 2

## 2017-08-02 MED ORDER — PHENYLEPHRINE HCL 10 MG/ML IJ SOLN
INTRAMUSCULAR | Status: AC
  Filled 2017-08-02: qty 1

## 2017-08-02 MED ORDER — ROCURONIUM BROMIDE 10 MG/ML (PF) SYRINGE
PREFILLED_SYRINGE | INTRAVENOUS | Status: DC | PRN
  Administered 2017-08-02: 50 mg via INTRAVENOUS

## 2017-08-02 MED ORDER — VANCOMYCIN HCL 1000 MG IV SOLR
INTRAVENOUS | Status: DC | PRN
  Administered 2017-08-02: 1000 mg

## 2017-08-02 MED ORDER — CHLORHEXIDINE GLUCONATE 4 % EX LIQD
60.0000 mL | Freq: Once | CUTANEOUS | Status: DC
  Filled 2017-08-02: qty 60

## 2017-08-02 MED ORDER — DEXAMETHASONE SODIUM PHOSPHATE 10 MG/ML IJ SOLN
INTRAMUSCULAR | Status: DC | PRN
  Administered 2017-08-02: 10 mg via INTRAVENOUS

## 2017-08-02 MED ORDER — VANCOMYCIN HCL 1000 MG IV SOLR
INTRAVENOUS | Status: AC
  Filled 2017-08-02: qty 1000

## 2017-08-02 MED ORDER — FENTANYL CITRATE (PF) 100 MCG/2ML IJ SOLN
25.0000 ug | INTRAMUSCULAR | Status: DC | PRN
  Administered 2017-08-02 (×2): 50 ug via INTRAVENOUS
  Administered 2017-08-02: 25 ug via INTRAVENOUS

## 2017-08-02 MED ORDER — LACTATED RINGERS IV SOLN
INTRAVENOUS | Status: DC | PRN
  Administered 2017-08-02 (×3): via INTRAVENOUS

## 2017-08-02 MED ORDER — OXYCODONE HCL 5 MG PO TABS
10.0000 mg | ORAL_TABLET | ORAL | Status: DC | PRN
  Administered 2017-08-02: 5 mg via ORAL
  Administered 2017-08-03 – 2017-08-04 (×6): 15 mg via ORAL
  Administered 2017-08-04: 10 mg via ORAL
  Administered 2017-08-05 (×2): 15 mg via ORAL
  Filled 2017-08-02 (×8): qty 3
  Filled 2017-08-02: qty 2
  Filled 2017-08-02: qty 3

## 2017-08-02 MED ORDER — PHENYLEPHRINE 40 MCG/ML (10ML) SYRINGE FOR IV PUSH (FOR BLOOD PRESSURE SUPPORT)
PREFILLED_SYRINGE | INTRAVENOUS | Status: DC | PRN
  Administered 2017-08-02: 80 ug via INTRAVENOUS

## 2017-08-02 MED ORDER — HYDROMORPHONE HCL 2 MG PO TABS
2.0000 mg | ORAL_TABLET | Freq: Once | ORAL | Status: AC
  Administered 2017-08-03: 2 mg via ORAL
  Filled 2017-08-02: qty 1

## 2017-08-02 MED ORDER — GLYCOPYRROLATE 0.2 MG/ML IV SOSY
PREFILLED_SYRINGE | INTRAVENOUS | Status: DC | PRN
  Administered 2017-08-02: .2 mg via INTRAVENOUS

## 2017-08-02 MED ORDER — MIDAZOLAM HCL 5 MG/5ML IJ SOLN
INTRAMUSCULAR | Status: DC | PRN
  Administered 2017-08-02: 2 mg via INTRAVENOUS

## 2017-08-02 MED ORDER — ROCURONIUM BROMIDE 50 MG/5ML IV SOSY
PREFILLED_SYRINGE | INTRAVENOUS | Status: AC
  Filled 2017-08-02: qty 5

## 2017-08-02 MED ORDER — CEFAZOLIN SODIUM-DEXTROSE 2-4 GM/100ML-% IV SOLN
2.0000 g | INTRAVENOUS | Status: DC
  Filled 2017-08-02: qty 100

## 2017-08-02 MED ORDER — FENTANYL CITRATE (PF) 100 MCG/2ML IJ SOLN
INTRAMUSCULAR | Status: AC
  Administered 2017-08-02: 25 ug via INTRAVENOUS
  Filled 2017-08-02: qty 2

## 2017-08-02 MED ORDER — LIDOCAINE 2% (20 MG/ML) 5 ML SYRINGE
INTRAMUSCULAR | Status: AC
  Filled 2017-08-02: qty 5

## 2017-08-02 MED ORDER — PROMETHAZINE HCL 25 MG/ML IJ SOLN: 6.2500 mg | INTRAMUSCULAR | Status: DC | PRN

## 2017-08-02 MED ORDER — MIDAZOLAM HCL 2 MG/2ML IJ SOLN
INTRAMUSCULAR | Status: AC
  Filled 2017-08-02: qty 2

## 2017-08-02 MED ORDER — PROPOFOL 10 MG/ML IV BOLUS
INTRAVENOUS | Status: DC | PRN
  Administered 2017-08-02: 160 mg via INTRAVENOUS

## 2017-08-02 MED ORDER — PROPOFOL 10 MG/ML IV BOLUS
INTRAVENOUS | Status: AC
  Filled 2017-08-02: qty 20

## 2017-08-02 MED ORDER — ALBUMIN HUMAN 5 % IV SOLN
INTRAVENOUS | Status: AC
  Filled 2017-08-02: qty 250

## 2017-08-02 MED ORDER — ONDANSETRON HCL 4 MG/2ML IJ SOLN
INTRAMUSCULAR | Status: DC | PRN
  Administered 2017-08-02: 4 mg via INTRAVENOUS

## 2017-08-02 SURGICAL SUPPLY — 35 items
APL SKNCLS STERI-STRIP NONHPOA (GAUZE/BANDAGES/DRESSINGS) ×1
BAG SPEC THK2 15X12 ZIP CLS (MISCELLANEOUS)
BAG ZIPLOCK 12X15 (MISCELLANEOUS) IMPLANT
BENZOIN TINCTURE PRP APPL 2/3 (GAUZE/BANDAGES/DRESSINGS) ×2 IMPLANT
BLADE SAW SGTL 18X1.27X75 (BLADE) ×2 IMPLANT
CELLS DAT CNTRL 66122 CELL SVR (MISCELLANEOUS) ×1 IMPLANT
CLOTH BEACON ORANGE TIMEOUT ST (SAFETY) ×2 IMPLANT
COVER PERINEAL POST (MISCELLANEOUS) ×2 IMPLANT
COVER SURGICAL LIGHT HANDLE (MISCELLANEOUS) ×2 IMPLANT
DRAPE STERI IOBAN 125X83 (DRAPES) ×2 IMPLANT
DRAPE U-SHAPE 47X51 STRL (DRAPES) ×4 IMPLANT
DRSG ADAPTIC 3X8 NADH LF (GAUZE/BANDAGES/DRESSINGS) ×2 IMPLANT
DRSG AQUACEL AG ADV 3.5X10 (GAUZE/BANDAGES/DRESSINGS) ×2 IMPLANT
DURAPREP 26ML APPLICATOR (WOUND CARE) ×2 IMPLANT
ELECT REM PT RETURN 15FT ADLT (MISCELLANEOUS) ×2 IMPLANT
GAUZE XEROFORM 1X8 LF (GAUZE/BANDAGES/DRESSINGS) ×2 IMPLANT
GLOVE BIO SURGEON STRL SZ7.5 (GLOVE) ×4 IMPLANT
GLOVE ECLIPSE 8.0 STRL XLNG CF (GLOVE) ×2 IMPLANT
GLOVE INDICATOR 8.0 STRL GRN (GLOVE) ×4 IMPLANT
GOWN STRL REUS W/TWL LRG LVL3 (GOWN DISPOSABLE) ×4 IMPLANT
HANDPIECE INTERPULSE COAX TIP (DISPOSABLE) ×2
HOLDER FOLEY CATH W/STRAP (MISCELLANEOUS) ×2 IMPLANT
KIT STIMULAN RAPID CURE  10CC (Orthopedic Implant) ×1 IMPLANT
KIT STIMULAN RAPID CURE 10CC (Orthopedic Implant) ×1 IMPLANT
PACK ANTERIOR HIP CUSTOM (KITS) ×2 IMPLANT
RTRCTR WOUND ALEXIS 18CM MED (MISCELLANEOUS) ×2
SET HNDPC FAN SPRY TIP SCT (DISPOSABLE) ×1 IMPLANT
STRIP CLOSURE SKIN 1/2X4 (GAUZE/BANDAGES/DRESSINGS) ×2 IMPLANT
SUT MNCRL AB 4-0 PS2 18 (SUTURE) ×2 IMPLANT
SUT VIC AB 1 CT1 36 (SUTURE) ×6 IMPLANT
SUT VIC AB 2-0 CT1 27 (SUTURE) ×4
SUT VIC AB 2-0 CT1 TAPERPNT 27 (SUTURE) ×2 IMPLANT
SWAB COLLECTION DEVICE MRSA (MISCELLANEOUS) ×2 IMPLANT
TRAY FOLEY W/METER SILVER 16FR (SET/KITS/TRAYS/PACK) IMPLANT
WATER STERILE IRR 1500ML POUR (IV SOLUTION) ×4 IMPLANT

## 2017-08-02 NOTE — Progress Notes (Signed)
Patient received back to room from PACU following surgery on left hip. Dressing to left hip clean, dry and intact. Patient alert and oriented. O2 at 2LPM. Donne Hazel, RN

## 2017-08-02 NOTE — Progress Notes (Signed)
Pharmacy Antibiotic Note  Ashley Shepherd is a 61 y.o. female admitted on 07/23/2017 with PJI (hip) and Klebsiella pyelonephritis in setting of HIV disease.  switching antibiotics to vancomycin and cefazolin.  Now on D#3 cefazolin + vancomycin with pharmacy dosing assistance.  ID service was consulted and is guiding antibiotic therapy.     Going for irrigation and debridement of L hip in OR today with possible removal of hardware.  Plan: Continue vancomycin 1250 mg IV q12h, check trough and SCr tomorrow AM Continue cefazolin 2 grams IV q8h Follow culture results, renal function Plans for 6 weeks of antibiotics noted Available to assist with OPAT once discharge antibiotics have been selected by ID physician   Height: 5' 10.5" (179.1 cm) Weight: 199 lb (90.3 kg) IBW/kg (Calculated) : 69.65  Temp (24hrs), Avg:99.5 F (37.5 C), Min:99.3 F (37.4 C), Max:99.6 F (37.6 C)   Recent Labs Lab 07/27/17 0530 07/28/17 0445 07/29/17 1418 07/30/17 0552 07/31/17 0534 08/01/17 0530 08/02/17 0600  WBC 16.7* 14.9* 17.7* 16.3* 16.3* 16.6*  --   CREATININE 1.13*  --   --  0.99 1.00 1.05* 1.01*    Estimated Creatinine Clearance: 71.9 mL/min (A) (by C-G formula based on SCr of 1.01 mg/dL (H)).    Allergies  Allergen Reactions  . Codeine Itching  . Ivp Dye [Iodinated Diagnostic Agents] Itching    Pt stated that she had NO problem/reaction to topical iodine/betadine solution.  . Sulfa Antibiotics Itching    Antimicrobials this admission: 7/31 Ceftriaxone >> 8/8 8/8 Vancomycin >> 8/8 Cefazolin >>  Dose adjustments this admission:   Microbiology results: 7/31 MRSA PCR screen: positive 7/31 BCx: NGF 7/31 UCx: Klebsiella pneumoniae S to cefazolin 8/6 left hip synovial fluid cx: ngtd 8/8 MRSA PCR screen: positive  Thank you for allowing pharmacy to be a part of this patient's care.  Clayburn Pert, PharmD, BCPS Pager: [phone] 08/02/2017  10:55 AM

## 2017-08-02 NOTE — Brief Op Note (Signed)
07/23/2017 - 08/02/2017  4:48 PM  PATIENT:  Ashley Shepherd  61 y.o. female  PRE-OPERATIVE DIAGNOSIS:  infected left total hip arthroplasty  POST-OPERATIVE DIAGNOSIS:  infected left total hip arthroplasty  PROCEDURE:  Procedure(s): Irrigation and debridement left hip with antibiotic bead placement (Left)  SURGEON:  Surgeon(s) and Role:    Mcarthur Rossetti, MD - Primary  PHYSICIAN ASSISTANT: Benita Stabile, PA-C  ANESTHESIA:   general  EBL:  Total I/O In: 1000 [I.V.:1000] Out: 300 [Blood:300]  COUNTS:  YES  DICTATION: .Other Dictation: Dictation Number [id number]  PLAN OF CARE: Admit to inpatient   PATIENT DISPOSITION:  PACU - hemodynamically stable.   Delay start of Pharmacological VTE agent (>24hrs) due to surgical blood loss or risk of bleeding: no

## 2017-08-02 NOTE — Progress Notes (Signed)
Patient ID: Ashley Shepherd, female   DOB: [birth year], 60 y.o.   MRN: [id number] I plan to take Ashley Shepherd to surgery today for a thorough irrigation and debridement of her left hip.  I will likely change out the poly-liner and hip ball with the hopes of retaining the other components.  I have explained this to her in detail.  She will need a minimum of 6 weeks of IV antibiotics.  The risks and benefits of surgery have been discussed.

## 2017-08-02 NOTE — Progress Notes (Signed)
PROGRESS NOTE  Ashley Shepherd XBM:[id number] DOB: [birth year] DOA: 07/23/2017 PCP: Marcia Brash, MD (Inactive)   LOS: 9 days   Brief Narrative / Interim history: 61 year old female with history of HIV, avascular necrosis of the left hip status post replacement, complicated by hardware infection in 2017 status post prolonged IV antibiotics/removal of hardware/spacer, came to the ED with fever. She was initially treated for UTI/pyelonephritis, however given persistent fevers she underwent a CT scan of the abdomen and pelvis which revealed a fluid collection on the left prosthetic hip. 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 Currently she is on vancomycin and ceftriaxone, continue. -plan for OR today for washout  Pyelonephritis with left-sided hydronephrosis -Urine cultures positive for Klebsiella. Urology consulted and they feel like the hydronephrosis appears to be chronic as it was present on prior scans.  Septic left hip -Aspirated of left hip done on 8/6 showed gram-positive cocci in chains. ID following. To get a washout in the OR on 8/10. Will probably need 6 weeks of IV antibiotics per infectious disease followed by chronic antibiotic suppression  HIV -Well-managed, viral load was undetectable and CD4 was 478 earlier this year  Chronic kidney disease stage III -Creatinine appears at baseline  DVT prophylaxis: heparin Code Status: Full code Family Communication: none at bedside Disposition Plan: TBD  Consultants:     ID  Urology  Orthopedic surgery   Procedures:   Left hip joint aspiration  Antimicrobials:  Ceftriaxone 7/31-8/7  Cefazolin 8/8 >>>>  Vancomycin 8/8 >>>>   Subjective: - no chest pain, shortness of breath, no abdominal pain, nausea or vomiting. Awaiting surgery   Objective: Vitals:   08/01/17 0611 08/01/17 1444 08/01/17 2215 08/02/17 0626  BP: 133/78 128/60 136/70 123/71  Pulse: 83 77 75 78  Resp: 18 18 18 18   Temp: 99.3 F (37.4 C) 99.6 F (37.6 C) 99.3 F (37.4 C) 99.5 F (37.5 C)  TempSrc: Oral Oral Oral Oral  SpO2: 100% 100% 100% 100%  Weight:      Height:        Intake/Output Summary (Last 24 hours) at 08/02/17 1532 Last data filed at 08/02/17 1030  Gross per 24 hour  Intake             1340 ml  Output                0 ml  Net             1340 ml   Filed Weights   07/23/17 1234  Weight: 90.3 kg (199 lb)    Examination:  Vitals:   08/01/17 0611 08/01/17 1444 08/01/17 2215 08/02/17 0626  BP: 133/78 128/60 136/70 123/71  Pulse: 83 77 75 78  Resp: 18 18 18 18   Temp: 99.3 F (37.4 C) 99.6 F (37.6 C) 99.3 F (37.4 C) 99.5 F (37.5 C)  TempSrc: Oral Oral Oral Oral  SpO2: 100% 100% 100% 100%  Weight:      Height:        Constitutional: NAD Eyes:  lids and conjunctivae normal Respiratory: clear to auscultation bilaterally, no wheezing, no crackles.  Cardiovascular: Regular rate and rhythm, no murmurs / rubs / gallops. No LE edema. Abdomen: no tenderness. Bowel sounds positive.  Skin: no rashes, lesions, ulcers. No induration Neurologic: CN 2-12 grossly intact. Strength 5/5 in all 4.   Data Reviewed: I have independently reviewed following labs and imaging studies   CBC:  Recent Labs Lab 07/28/17 0445 07/29/17 1418 07/30/17 0552 07/31/17 0534 08/01/17 0530  WBC 14.9* 17.7* 16.3* 16.3* 16.6*  HGB 10.9* 10.8* 10.5* 11.0* 10.6*  HCT 31.1* 30.7* 30.6* 31.6* 31.3*  MCV 89.6 91.1 90.5 91.6 92.3  PLT 220 309 316 348 270   Basic  Metabolic Panel:  Recent Labs Lab 07/27/17 0530 07/30/17 0552 07/31/17 0534 08/01/17 0530 08/02/17 0600  NA 141 140 139 137  --   K 3.9 3.4* 3.6 3.8  --   CL 108 102 102 100*  --   CO2 24 28 28 27   --   GLUCOSE 108* 110* 125* 126*  --   BUN 10 10 8 7   --   CREATININE 1.13* 0.99 1.00 1.05* 1.01*  CALCIUM 8.9 9.0 8.8* 9.0  --   MG  --   --  1.9  --   --    GFR: Estimated Creatinine Clearance: 71.9 mL/min (A) (by C-G formula based on SCr of 1.01 mg/dL (H)). Liver Function Tests: No results for input(s): AST, ALT, ALKPHOS, BILITOT, PROT, ALBUMIN in the last 168 hours. No results for input(s): LIPASE, AMYLASE in the last 168 hours. No results for input(s): AMMONIA in the last 168 hours. Coagulation Profile: No results for input(s): INR, PROTIME in the last 168 hours. Cardiac Enzymes: No results for input(s): CKTOTAL, CKMB, CKMBINDEX, TROPONINI in the last 168 hours. BNP (last 3 results) No results for input(s): PROBNP in the last 8760 hours. HbA1C: No results for input(s): HGBA1C in the last 72 hours. CBG: No results for input(s): GLUCAP in the last 168 hours. Lipid Profile: No results for input(s): CHOL, HDL, LDLCALC, TRIG, CHOLHDL, LDLDIRECT in the last 72 hours. Thyroid Function Tests: No results for input(s): TSH, T4TOTAL, FREET4, T3FREE, THYROIDAB in the last 72 hours. Anemia Panel: No results for input(s): VITAMINB12, FOLATE, FERRITIN, TIBC, IRON, RETICCTPCT in the last 72 hours. Urine analysis:    Component Value Date/Time   COLORURINE AMBER (A) 07/23/2017 1350   APPEARANCEUR CLOUDY (A) 07/23/2017 1350   LABSPEC 1.018 07/23/2017 1350   PHURINE 5.5 07/23/2017 1350   GLUCOSEU NEGATIVE 07/23/2017 1350   HGBUR SMALL (A) 07/23/2017 1350   BILIRUBINUR NEGATIVE 07/23/2017 1350   KETONESUR NEGATIVE 07/23/2017 1350   PROTEINUR 30 (A) 07/23/2017 1350   UROBILINOGEN 0.2 09/23/2014 1056   NITRITE NEGATIVE 07/23/2017 1350   LEUKOCYTESUR LARGE (A) 07/23/2017 1350   Sepsis  Labs: Invalid input(s): PROCALCITONIN, LACTICIDVEN  Recent Results (from the past 240 hour(s))  MRSA PCR Screening     Status: Abnormal   Collection Time: 07/23/17  6:48 PM  Result Value Ref Range Status   MRSA by PCR POSITIVE (A) NEGATIVE Final    Comment:        The GeneXpert MRSA Assay (FDA approved for NASAL specimens only), is one component of a comprehensive MRSA colonization surveillance program. It is not intended to diagnose MRSA infection nor to guide or monitor treatment for MRSA infections. RESULT CALLED TO, READ BACK BY AND VERIFIED WITH: [place] [id number] @ [place]   Body fluid culture  Status: None   Collection Time: 07/29/17  3:30 PM  Result Value Ref Range Status   Specimen Description SYNOVIAL LEFT HIP  Final   Special Requests NONE  Final   Gram Stain   Final    ABUNDANT WBC PRESENT,BOTH PMN AND MONONUCLEAR FEW GRAM POSITIVE COCCI IN PAIRS IN CLUSTERS    Culture   Final    NO GROWTH 3 DAYS Performed at [facility], [place] [address] [place], [place] [address]    Report Status 08/02/2017 FINAL  Final  MRSA PCR Screening     Status: Abnormal   Collection Time: 07/31/17 10:25 PM  Result Value Ref Range Status   MRSA by PCR POSITIVE (A) NEGATIVE Final    Comment:        The GeneXpert MRSA Assay (FDA approved for NASAL specimens only), is one component of a comprehensive MRSA colonization surveillance program. It is not intended to diagnose MRSA infection nor to guide or monitor treatment for MRSA infections. RESULT CALLED TO, READ BACK BY AND VERIFIED WITH: ONEILL,M RN 8.9.18 @0026  ZANDO,C       Radiology Studies: No results found.   Scheduled Meds: . [MAR Hold] aspirin EC  81 mg Oral Daily  . chlorhexidine  60 mL Topical Once  . [MAR Hold] Chlorhexidine Gluconate Cloth  6 each Topical Q0600  . [MAR Hold] citalopram  20 mg Oral q morning - 10a  . [MAR Hold] dolutegravir  50 mg Oral BID  . [MAR Hold]  emtricitabine-tenofovir AF  1 tablet Oral Daily  . [MAR Hold] ferrous sulfate  325 mg Oral BID  . [MAR Hold] heparin  5,000 Units Subcutaneous Q8H  . [MAR Hold] maraviroc  300 mg Oral BID  . [MAR Hold] multivitamin with minerals  1 tablet Oral Daily  . [MAR Hold] mupirocin ointment  1 application Nasal BID  . [MAR Hold] pantoprazole  40 mg Oral Daily   Continuous Infusions: . [MAR Hold]  ceFAZolin (ANCEF) IV Stopped (08/02/17 1432)  .  ceFAZolin (ANCEF) IV    . [MAR Hold] vancomycin 1,250 mg (08/02/17 0459)     Marzetta Board, MD, PhD [facility] Pager [phone]  If 7PM-7AM, please contact night-coverage [email or web address] Password [facility] 08/02/2017, 3:32 PM

## 2017-08-02 NOTE — Transfer of Care (Signed)
Immediate Anesthesia Transfer of Care Note  Patient: Ashley Shepherd  Procedure(s) Performed: Procedure(s): Irrigation and debridement left hip with antibiotic bead placement (Left)  Patient Location: PACU  Anesthesia Type:General  Level of Consciousness: awake, alert  and patient cooperative  Airway & Oxygen Therapy: Patient Spontanous Breathing and Patient connected to face mask oxygen  Post-op Assessment: Report given to RN, Post -op Vital signs reviewed and stable and Patient moving all extremities X 4  Post vital signs: stable  Last Vitals:  Vitals:   08/02/17 0626 08/02/17 1730  BP: 123/71 (!) 112/52  Pulse: 78 79  Resp: 18 14  Temp: 37.5 C 36.9 C  SpO2: 100% 99%    Last Pain:  Vitals:   08/02/17 0842  TempSrc:   PainSc: Asleep      Patients Stated Pain Goal: 2 (03/70/96 4383)  Complications: No apparent anesthesia complications

## 2017-08-02 NOTE — Care Management Important Message (Signed)
Important Message  Patient Details  Name: Ashley Shepherd MRN: [id number] Date of Birth: [birth year]   Medicare Important Message Given:  Yes    Kerin Salen 08/02/2017, 10:10 AMImportant Message  Patient Details  Name: Ashley Shepherd MRN: [id number] Date of Birth: 06/20/1956   Medicare Important Message Given:  Yes    Kerin Salen 08/02/2017, 10:10 AM

## 2017-08-02 NOTE — Anesthesia Preprocedure Evaluation (Addendum)
Anesthesia Evaluation  Patient identified by MRN, date of birth, ID band Patient awake    Reviewed: Allergy & Precautions, H&P , NPO status , Patient's Chart, lab work & pertinent test results, reviewed documented beta blocker date and time   Airway Mallampati: II  TM Distance: >3 FB Neck ROM: full    Dental no notable dental hx. (+) Edentulous Upper, Edentulous Lower   Pulmonary Current Smoker,    Pulmonary exam normal breath sounds clear to auscultation       Cardiovascular negative cardio ROS Normal cardiovascular exam Rhythm:regular Rate:Normal     Neuro/Psych PSYCHIATRIC DISORDERS Anxiety Depression negative neurological ROS     GI/Hepatic Neg liver ROS, GERD  ,  Endo/Other  negative endocrine ROS  Renal/GU Nephrolithiasis     Musculoskeletal negative musculoskeletal ROS (+) Arthritis , Osteoarthritis,    Abdominal   Peds  Hematology  (+) anemia , HIV,   Anesthesia Other Findings HIV, hx cancer now in remission, avascular necrosis of left hip  Reproductive/Obstetrics negative OB ROS                           Anesthesia Physical  Anesthesia Plan  ASA: II  Anesthesia Plan: General   Post-op Pain Management:    Induction: Intravenous  PONV Risk Score and Plan: 1 and Ondansetron, Dexamethasone and Treatment may vary due to age or medical condition  Airway Management Planned: Oral ETT  Additional Equipment:   Intra-op Plan:   Post-operative Plan: Extubation in OR  Informed Consent: I have reviewed the patients History and Physical, chart, labs and discussed the procedure including the risks, benefits and alternatives for the proposed anesthesia with the patient or authorized representative who has indicated his/her understanding and acceptance.   Dental Advisory Given and Dental advisory given  Plan Discussed with: CRNA and Surgeon  Anesthesia Plan Comments: (Discussed  general anesthesia, including possible nausea, instrumentation of airway, sore throat,pulmonary aspiration, etc. I asked if the were any outstanding questions, or  concerns before we proceeded. )       Anesthesia Quick Evaluation

## 2017-08-02 NOTE — Progress Notes (Signed)
Per MD notes, Dr Ninfa Linden plans to take patient to the OR today. No orders for NPO, although pt is NPO in anticipation of surgery and 1000 medication held. Trying to reach MD to ask about consent and wording. Call made to [phone] but there was no answer. Will try again after 1300. Donne Hazel, RN

## 2017-08-02 NOTE — Anesthesia Procedure Notes (Signed)
Procedure Name: Intubation Date/Time: 08/02/2017 3:23 PM Performed by: Lissa Morales Pre-anesthesia Checklist: Patient identified, Emergency Drugs available, Suction available and Patient being monitored Patient Re-evaluated:Patient Re-evaluated prior to induction Oxygen Delivery Method: Circle system utilized Preoxygenation: Pre-oxygenation with 100% oxygen Induction Type: IV induction Ventilation: Mask ventilation without difficulty Laryngoscope Size: Mac and 4 Grade View: Grade II Tube type: Oral Number of attempts: 1 Airway Equipment and Method: Stylet and Oral airway Placement Confirmation: ETT inserted through vocal cords under direct vision,  positive ETCO2 and breath sounds checked- equal and bilateral Secured at: 22 cm Tube secured with: Tape Dental Injury: Teeth and Oropharynx as per pre-operative assessment

## 2017-08-02 NOTE — Progress Notes (Signed)
Nutrition Brief Note  Patient identified as extended length of stay. Pt day 9 of admission.   Wt Readings from Last 15 Encounters:  07/23/17 199 lb (90.3 kg)  09/23/16 199 lb (90.3 kg)  09/06/16 200 lb (90.7 kg)  08/09/16 199 lb (90.3 kg)  04/06/16 191 lb (86.6 kg)  03/29/16 191 lb (86.6 kg)  03/06/16 191 lb (86.6 kg)  01/24/16 199 lb (90.3 kg)  10/01/14 182 lb (82.6 kg)  09/23/14 182 lb (82.6 kg)  04/09/14 190 lb (86.2 kg)  02/13/14 197 lb (89.4 kg)  02/10/14 200 lb (90.7 kg)  10/26/13 202 lb (91.6 kg)  06/15/13 202 lb (91.6 kg)    Body mass index is 28.15 kg/m. Patient meets criteria for overweight based on current BMI.   Current diet order is regular, patient is consuming approximately 80% of meals at this time. Labs and medications reviewed.   No nutrition interventions warranted at this time. If nutrition issues arise, please consult RD.   Mariana Single RD, LDN Clinical Nutrition Pager # [phone]

## 2017-08-02 NOTE — Progress Notes (Signed)
[facility] for Infectious Disease    Date of Admission:  07/23/2017   Total days of antibiotics 11   ID: Ashley Shepherd is a 61 y.o. 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 aspirin EC  81 mg Oral Daily  . Chlorhexidine Gluconate Cloth  6 each Topical Q0600  . citalopram  20 mg Oral q morning - 10a  . dolutegravir  50 mg Oral BID  . emtricitabine-tenofovir AF  1 tablet Oral Daily  . ferrous sulfate  325 mg Oral BID  . heparin  5,000 Units Subcutaneous Q8H  . maraviroc  300 mg Oral BID  . multivitamin with minerals  1 tablet Oral Daily  . mupirocin ointment  1 application Nasal BID  . pantoprazole  40 mg Oral Daily    Objective: Vital signs in last 24 hours: Temp:  [99.3 F (37.4 C)-99.6 F (37.6 C)] 99.5 F (37.5 C) (08/10 0626) Pulse Rate:  [75-78] 78 (08/10 0626) Resp:  [18] 18 (08/10 0626) BP: (123-136)/(60-71) 123/71 (08/10 0626) SpO2:  [100 %] 100 % (08/10 0626) Physical Exam  Constitutional:  oriented to person, place, and time. appears well-developed and well-nourished. No distress.  HENT: [place]/AT, PERRLA, no scleral icterus Mouth/Throat: Oropharynx is clear and moist. No oropharyngeal exudate.  Cardiovascular: Normal rate, regular rhythm and normal heart sounds. Exam reveals no gallop and no friction rub.  No murmur heard.  Pulmonary/Chest: Effort normal and breath sounds normal. No respiratory distress.  has no wheezes.  Abdominal: mildly distended. Bowel sounds are normal.  There is no tenderness.  Lymphadenopathy: no cervical adenopathy. No axillary adenopathy Neurological:  alert and oriented to person, place, and time.  Skin: keloid scarring at previous site, some of area feels indurated Psychiatric: a normal mood and affect.  behavior is normal.   Lab Results  Recent Labs  07/31/17 0534 08/01/17 0530 08/02/17 0600  WBC 16.3* 16.6*  --   HGB 11.0* 10.6*  --   HCT 31.6* 31.3*  --   NA 139 137  --   K 3.6 3.8  --   CL 102 100*  --   CO2 28 27  --   BUN 8 7  --   CREATININE 1.00 1.05* 1.01*   Lab Results  Component Value Date   ESRSEDRATE 127 (H) 07/30/2017   Lab Results  Component Value Date   CRP 30.0 (H) 07/30/2017    Microbiology: 8/6 aspirate still ngtd but gram stain showing gpc  Studies/Results: No results found.   Assessment/Plan: pji = continue on vancomycin and cefazolin. Would also like to have more specimen sent from the OR. Plan for 6 wk of tx. 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 Using 8/11 as day 1. Will get picc line placed. We will then recommend at minimum of 6 months of oral abtx but may need to consider chronic suppression.  kleb pyelonephritis= treated with cefazolin  hiv disease = well controlled. Continue on current regimen  Leukocytosis = still needs source control on pji of hip  Fever curve = improved  Ashley Shepherd,  [facility] Cell: [phone] Pager: [phone]  08/02/2017, 1:33 PM

## 2017-08-03 LAB — CBC
HEMATOCRIT: 28.9 % — AB (ref 36.0–46.0)
Hemoglobin: 9.8 g/dL — ABNORMAL LOW (ref 12.0–15.0)
MCH: 31.3 pg (ref 26.0–34.0)
MCHC: 33.9 g/dL (ref 30.0–36.0)
MCV: 92.3 fL (ref 78.0–100.0)
Platelets: 460 10*3/uL — ABNORMAL HIGH (ref 150–400)
RBC: 3.13 MIL/uL — ABNORMAL LOW (ref 3.87–5.11)
RDW: 15.5 % (ref 11.5–15.5)
WBC: 22.7 10*3/uL — AB (ref 4.0–10.5)

## 2017-08-03 LAB — CREATININE, SERUM
Creatinine, Ser: 1.11 mg/dL — ABNORMAL HIGH (ref 0.44–1.00)
GFR calc Af Amer: >60 mL/min (ref >60)
GFR calc non Af Amer: 52 mL/min — ABNORMAL LOW (ref >60)

## 2017-08-03 LAB — VANCOMYCIN, TROUGH: VANCOMYCIN TR: 25 ug/mL — AB (ref 15–20)

## 2017-08-03 MED ORDER — VANCOMYCIN HCL IN DEXTROSE 750-5 MG/150ML-% IV SOLN
750.0000 mg | Freq: Two times a day (BID) | INTRAVENOUS | Status: DC
  Administered 2017-08-03 – 2017-08-04 (×2): 750 mg via INTRAVENOUS
  Filled 2017-08-03 (×3): qty 150

## 2017-08-03 MED ORDER — METHOCARBAMOL 500 MG PO TABS
750.0000 mg | ORAL_TABLET | Freq: Once | ORAL | Status: AC
  Administered 2017-08-03: 750 mg via ORAL
  Filled 2017-08-03: qty 2

## 2017-08-03 NOTE — Anesthesia Postprocedure Evaluation (Signed)
Anesthesia Post Note  Patient: Ashley Shepherd  Procedure(s) Performed: Procedure(s) (LRB): Irrigation and debridement left hip with antibiotic bead placement (Left)     Patient location during evaluation: PACU Anesthesia Type: General Level of consciousness: awake and alert Pain management: pain level controlled Vital Signs Assessment: post-procedure vital signs reviewed and stable Respiratory status: spontaneous breathing, nonlabored ventilation, respiratory function stable and patient connected to nasal cannula oxygen Cardiovascular status: blood pressure returned to baseline and stable Postop Assessment: no signs of nausea or vomiting Anesthetic complications: no    Last Vitals:  Vitals:   08/03/17 0149 08/03/17 0522  BP: 126/69 127/64  Pulse: 76 74  Resp: 18 16  Temp: 36.9 C 36.9 C  SpO2: 97% 98%    Last Pain:  Vitals:   08/03/17 0754  TempSrc:   PainSc: 8                  [name]

## 2017-08-03 NOTE — Progress Notes (Signed)
Patient ID: Ashley Shepherd, female   DOB: 06-19-1956, 61 y.o.   MRN: [id number] Tolerated surgery well yesterday and does feel better with much less left hip pain.  I did encounter a large fluid collection concerning for a prosthetic joint infection.   I was able to irrigate/debride soft tissue around the hip and prosthesis and place deep antibiotic beads.  I spoke to her in detail about this.  She will certainly need a PICC line and 6 weeks of IV antibiotics.  She will also likely need chronic lifetime suppression with oral antibiotics in an effort to keep the joint.  Can likely discharge to home Monday.

## 2017-08-03 NOTE — Progress Notes (Signed)
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 She was initially treated for UTI/pyelonephritis, however given persistent fevers she underwent a CT scan of the abdomen and pelvis which revealed a fluid collection on the left prosthetic hip. 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 Currently she is on vancomycin and ceftriaxone, continue. -Status post hip I&D yesterday 8/10, appreciate orthopedic surgery consultation. -Need 6 weeks of IV antibiotics starting 8/10  Pyelonephritis with left-sided hydronephrosis -Urine cultures positive for Klebsiella. Urology consulted and they feel like the hydronephrosis appears to be chronic as it was present on prior scans.  Septic left hip -Aspirated of left hip done on 8/6 showed gram-positive cocci in chains. ID following. To get a washout in the OR on 8/10. Will probably need 6 weeks of IV antibiotics per infectious disease followed by chronic antibiotic suppression  HIV -Well-managed, viral load was undetectable and CD4 was 478 earlier this year  Chronic kidney disease stage III -Creatinine appears at baseline  DVT prophylaxis:  heparin Code Status: Full code Family Communication: none at bedside Disposition Plan: TBD  Consultants:   ID  Urology  Orthopedic surgery   Procedures:   Left hip joint aspiration  Antimicrobials:  Ceftriaxone 7/31-8/7  Cefazolin 8/8 >>>>  Vancomycin 8/8 >>>>   Subjective: - no chest pain, shortness of breath, no abdominal pain, nausea or vomiting.   Objective: Vitals:   08/02/17 2030 08/02/17 2130 08/03/17 0149 08/03/17 0522  BP: (!) 124/58 133/90 126/69 127/64  Pulse: 79 77 76 74  Resp: 16 18 18 16   Temp: 100.1 F (37.8 C) 98.8 F (37.1 C) 98.4 F (36.9 C) 98.4 F (36.9 C)  TempSrc: Oral Oral Oral Oral  SpO2: 100% 99% 97% 98%  Weight:      Height:        Intake/Output Summary (Last 24 hours) at 08/03/17 1259 Last data filed at 08/03/17 1000  Gross per 24 hour  Intake             3550 ml  Output             1370 ml  Net             2180 ml   Filed Weights   07/23/17 1234  Weight: 90.3 kg (199 lb)    Examination:  Vitals:   08/02/17 2030 08/02/17 2130 08/03/17 0149 08/03/17 0522  BP: (!) 124/58 133/90 126/69 127/64  Pulse: 79 77 76 74  Resp: 16 18 18 16   Temp: 100.1 F (37.8 C) 98.8 F (37.1 C) 98.4 F (36.9 C) 98.4 F (36.9 C)  TempSrc: Oral Oral Oral Oral  SpO2: 100% 99% 97% 98%  Weight:      Height:  Constitutional: NAD Respiratory: CTA Cardiovascular: RRR Abdomen: no ternderness  Data Reviewed: I have independently reviewed following labs and imaging studies   CBC:  Recent Labs Lab 07/29/17 1418 07/30/17 0552 07/31/17 0534 08/01/17 0530 08/02/17 1640 08/03/17 0546  WBC 17.7* 16.3* 16.3* 16.6*  --  22.7*  HGB 10.8* 10.5* 11.0* 10.6* 8.9* 9.8*  HCT 30.7* 30.6* 31.6* 31.3* 26.0* 28.9*  MCV 91.1 90.5 91.6 92.3  --  92.3  PLT 309 316 348 380  --  357*   Basic Metabolic Panel:  Recent Labs Lab 07/30/17 0552 07/31/17 0534 08/01/17 0530 08/02/17 0600 08/03/17 0546  NA 140 139 137  --   --   K 3.4* 3.6 3.8   --   --   CL 102 102 100*  --   --   CO2 28 28 27   --   --   GLUCOSE 110* 125* 126*  --   --   BUN 10 8 7   --   --   CREATININE 0.99 1.00 1.05* 1.01* 1.11*  CALCIUM 9.0 8.8* 9.0  --   --   MG  --  1.9  --   --   --    GFR: Estimated Creatinine Clearance: 65.5 mL/min (A) (by C-G formula based on SCr of 1.11 mg/dL (H)). Liver Function Tests: No results for input(s): AST, ALT, ALKPHOS, BILITOT, PROT, ALBUMIN in the last 168 hours. No results for input(s): LIPASE, AMYLASE in the last 168 hours. No results for input(s): AMMONIA in the last 168 hours. Coagulation Profile: No results for input(s): INR, PROTIME in the last 168 hours. Cardiac Enzymes: No results for input(s): CKTOTAL, CKMB, CKMBINDEX, TROPONINI in the last 168 hours. BNP (last 3 results) No results for input(s): PROBNP in the last 8760 hours. HbA1C: No results for input(s): HGBA1C in the last 72 hours. CBG: No results for input(s): GLUCAP in the last 168 hours. Lipid Profile: No results for input(s): CHOL, HDL, LDLCALC, TRIG, CHOLHDL, LDLDIRECT in the last 72 hours. Thyroid Function Tests: No results for input(s): TSH, T4TOTAL, FREET4, T3FREE, THYROIDAB in the last 72 hours. Anemia Panel: No results for input(s): VITAMINB12, FOLATE, FERRITIN, TIBC, IRON, RETICCTPCT in the last 72 hours. Urine analysis:    Component Value Date/Time   COLORURINE AMBER (A) 07/23/2017 1350   APPEARANCEUR CLOUDY (A) 07/23/2017 1350   LABSPEC 1.018 07/23/2017 1350   PHURINE 5.5 07/23/2017 1350   GLUCOSEU NEGATIVE 07/23/2017 1350   HGBUR SMALL (A) 07/23/2017 1350   BILIRUBINUR NEGATIVE 07/23/2017 1350   KETONESUR NEGATIVE 07/23/2017 1350   PROTEINUR 30 (A) 07/23/2017 1350   UROBILINOGEN 0.2 09/23/2014 1056   NITRITE NEGATIVE 07/23/2017 1350   LEUKOCYTESUR LARGE (A) 07/23/2017 1350   Sepsis Labs: Invalid input(s): PROCALCITONIN, LACTICIDVEN  Recent Results (from the past 240 hour(s))  Body fluid culture     Status: None    Collection Time: 07/29/17  3:30 PM  Result Value Ref Range Status   Specimen Description SYNOVIAL LEFT HIP  Final   Special Requests NONE  Final   Gram Stain   Final    ABUNDANT WBC PRESENT,BOTH PMN AND MONONUCLEAR FEW GRAM POSITIVE COCCI IN PAIRS IN CLUSTERS    Culture   Final    NO GROWTH 3 DAYS Performed at [facility], [address], [place],  [address]    Report Status 08/02/2017 FINAL  Final  MRSA PCR Screening     Status: Abnormal   Collection Time: 07/31/17 10:25 PM  Result Value  Ref Range Status   MRSA by PCR POSITIVE (A) NEGATIVE Final    Comment:        The GeneXpert MRSA Assay (FDA approved for NASAL specimens only), is one component of a comprehensive MRSA colonization surveillance program. It is not intended to diagnose MRSA infection nor to guide or monitor treatment for MRSA infections. RESULT CALLED TO, READ BACK BY AND VERIFIED WITH: ONEILL,M RN 8.9.18 @0026  ZANDO,C   Aerobic/Anaerobic Culture (surgical/deep wound)     Status: None (Preliminary result)   Collection Time: 08/02/17  3:44 PM  Result Value Ref Range Status   Specimen Description WOUND LEFT HIP  Final   Special Requests NONE  Final   Gram Stain   Final    RARE WBC PRESENT, PREDOMINANTLY PMN NO ORGANISMS SEEN Performed at [facility], [place] [address], [place], [place] [address]    Culture PENDING  Incomplete   Report Status PENDING  Incomplete      Radiology Studies: Dg C-arm 1-60 Min-no Report  Result Date: 08/02/2017 Fluoroscopy was utilized by the requesting physician.  No radiographic interpretation.   Dg Hip Operative Unilat With Pelvis Left  Result Date: 08/02/2017 CLINICAL DATA:  Left hip incision and drainage. EXAM: OPERATIVE LEFT HIP (WITH PELVIS IF PERFORMED) 1 VIEW TECHNIQUE: Fluoroscopic spot image(s) were submitted for interpretation post-operatively. COMPARISON:  Pelvic x-rays dated July 26, 2017. FLUOROSCOPY TIME:  5 seconds. C-arm fluoroscopic  images were obtained intraoperatively and submitted for post operative interpretation. FINDINGS: Intraoperative x-rays demonstrate left total hip arthroplasty with well aligned components. No definite evidence of hardware complication. IMPRESSION: Left total hip arthroplasty without evidence of hardware complication. Electronically Signed   By: Titus Dubin M.D.   On: 08/02/2017 17:00     Scheduled Meds: . aspirin EC  81 mg Oral Daily  . Chlorhexidine Gluconate Cloth  6 each Topical Q0600  . citalopram  20 mg Oral q morning - 10a  . dolutegravir  50 mg Oral BID  . emtricitabine-tenofovir AF  1 tablet Oral Daily  . ferrous sulfate  325 mg Oral BID  . heparin  5,000 Units Subcutaneous Q8H  . maraviroc  300 mg Oral BID  . multivitamin with minerals  1 tablet Oral Daily  . mupirocin ointment  1 application Nasal BID  . pantoprazole  40 mg Oral Daily   Continuous Infusions: .  ceFAZolin (ANCEF) IV 2 g (08/03/17 2119)  . vancomycin       Marzetta Board, MD, PhD [facility] Pager [phone]  If 7PM-7AM, please contact night-coverage [email or web address] Password [facility] 08/03/2017, 12:59 PM

## 2017-08-03 NOTE — Progress Notes (Signed)
Pt has refused to receive heparin subq injections stating "that's why I wear these inflating hose things and I get up and walk around, I shouldn't need the shot too, I will talk to my doctor about it this morning."

## 2017-08-03 NOTE — Progress Notes (Signed)
Pharmacy Antibiotic Note  ITA FRITZSCHE is a 61 y.o. female admitted on 07/23/2017 with PJI (hip) and Klebsiella pyelonephritis in setting of HIV disease.  switching antibiotics to vancomycin and cefazolin.  Now on D#3 cefazolin + vancomycin with pharmacy dosing assistance.  ID service was consulted and is guiding antibiotic therapy.       Plan: VT=25 mg/L, Scr=1.11 this am Decrease Vancomycin to 750 mg IV q12h next dose 2 pm today Continue cefazolin 2 grams IV q8h Follow culture results, renal function Plans for 6 weeks of antibiotics noted Available to assist with OPAT once discharge antibiotics have been selected by ID physician   Height: 5' 10.5" (179.1 cm) Weight: 199 lb (90.3 kg) IBW/kg (Calculated) : 69.65  Temp (24hrs), Avg:99.1 F (37.3 C), Min:98.3 F (36.8 C), Max:100.3 F (37.9 C)   Recent Labs Lab 07/29/17 1418 07/30/17 0552 07/31/17 0534 08/01/17 0530 08/02/17 0600 08/03/17 0546  WBC 17.7* 16.3* 16.3* 16.6*  --  22.7*  CREATININE  --  0.99 1.00 1.05* 1.01* 1.11*  VANCOTROUGH  --   --   --   --   --  25*    Estimated Creatinine Clearance: 65.5 mL/min (A) (by C-G formula based on SCr of 1.11 mg/dL (H)).    Allergies  Allergen Reactions  . Codeine Itching  . Ivp Dye [Iodinated Diagnostic Agents] Itching    Pt stated that she had NO problem/reaction to topical iodine/betadine solution.  . Sulfa Antibiotics Itching    Antimicrobials this admission: 7/31 Ceftriaxone >> 8/8 8/8 Vancomycin >> 8/8 Cefazolin >>  Dose adjustments this admission:   Microbiology results: 7/31 MRSA PCR screen: positive 7/31 BCx: NGF 7/31 UCx: Klebsiella pneumoniae S to cefazolin 8/6 left hip synovial fluid cx: ngtd 8/8 MRSA PCR screen: positive  Thank you for allowing pharmacy to be a part of this patient's care.   Dorrene German 08/03/2017  6:49 AM

## 2017-08-03 NOTE — Progress Notes (Signed)
Dr. Ninfa Linden aware via phone that silicone dressing has saturated with sanguineous drainage this afternoon. md stated that's expected with orders to changed using gauze and abd pads.

## 2017-08-03 NOTE — Progress Notes (Signed)
Pt c/o spasms in her left leg. She reported that the last time she had hip surgery she also had spasm. Notified provider on call. Obtained order for muscle relaxer.

## 2017-08-04 LAB — BASIC METABOLIC PANEL
Anion gap: 11 (ref 5–15)
BUN: 9 mg/dL (ref 6–20)
CALCIUM: 9.6 mg/dL (ref 8.9–10.3)
CO2: 31 mmol/L (ref 22–32)
CREATININE: 1.11 mg/dL — AB (ref 0.44–1.00)
Chloride: 100 mmol/L — ABNORMAL LOW (ref 101–111)
GFR calc non Af Amer: 52 mL/min — ABNORMAL LOW (ref >60)
Glucose, Bld: 141 mg/dL — ABNORMAL HIGH (ref 65–99)
Potassium: 3.7 mmol/L (ref 3.5–5.1)
SODIUM: 142 mmol/L (ref 135–145)

## 2017-08-04 LAB — CBC
HCT: 27.6 % — ABNORMAL LOW (ref 36.0–46.0)
Hemoglobin: 9.2 g/dL — ABNORMAL LOW (ref 12.0–15.0)
MCH: 31.2 pg (ref 26.0–34.0)
MCHC: 33.3 g/dL (ref 30.0–36.0)
MCV: 93.6 fL (ref 78.0–100.0)
PLATELETS: 441 10*3/uL — AB (ref 150–400)
RBC: 2.95 MIL/uL — AB (ref 3.87–5.11)
RDW: 15.3 % (ref 11.5–15.5)
WBC: 13.3 10*3/uL — ABNORMAL HIGH (ref 4.0–10.5)

## 2017-08-04 MED ORDER — VANCOMYCIN HCL IN DEXTROSE 750-5 MG/150ML-% IV SOLN
750.0000 mg | Freq: Two times a day (BID) | INTRAVENOUS | Status: DC
  Administered 2017-08-04 – 2017-08-05 (×3): 750 mg via INTRAVENOUS
  Filled 2017-08-04 (×3): qty 150

## 2017-08-04 MED ORDER — SODIUM CHLORIDE 0.9% FLUSH: 10.0000 mL | INTRAVENOUS | Status: DC | PRN

## 2017-08-04 NOTE — Progress Notes (Signed)
Patient ID: Ashley Shepherd, female   DOB: Sep 30, 1956, 61 y.o.   MRN: [id number] No acute changes.  Left hip incision with moderate drainage yesterday.  This is due to the surgery as well as the antibiotic beads.  Final cultures with gram pos. cocci. Should likely treat this as staph given her MRSA history.  Can likely be discharged to home tomorrow.

## 2017-08-04 NOTE — Progress Notes (Signed)
Pt had a small amount of blood from her left hip incision site. I changed the dressing. Notified provider on call. Obtained order to hold 0600 heparin. Will continue to monitor.

## 2017-08-04 NOTE — Progress Notes (Signed)
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 She was initially treated for UTI/pyelonephritis, however given persistent fevers she underwent a CT scan of the abdomen and pelvis which revealed a fluid collection on the left prosthetic hip. High suspicion for infection. Went to OR for I&D 8/10.  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 Currently she is on vancomycin and Cefazolin, continue. -Status post hip I&D 8/10, appreciate orthopedic surgery consultation. -Need 6 weeks of IV antibiotics starting 8/10 -Cultures with few gram-positive cocci pairs, however there was no growth at 3 days and the cultures are final.  Pyelonephritis with left-sided hydronephrosis -Urine cultures positive for Klebsiella. Urology consulted and they feel like the hydronephrosis appears to be chronic as it was present on prior scans.  Septic left hip -Aspirated of left hip done on 8/6 showed gram-positive cocci in chains. ID following. To get a washout in the OR on 8/10. Will probably need 6 weeks of IV antibiotics per infectious disease followed by chronic antibiotic suppression  HIV -Well-managed, viral load was  undetectable and CD4 was 478 earlier this year  Chronic kidney disease stage III -Creatinine appears at baseline  DVT prophylaxis: heparin Code Status: Full code Family Communication: none at bedside Disposition Plan: home Monday  Consultants:   ID  Urology  Orthopedic surgery   Procedures:   Left hip joint aspiration  Antimicrobials:  Ceftriaxone 7/31-8/7  Cefazolin 8/8 >>>>  Vancomycin 8/8 >>>>   Subjective: -feels well today, no pain, no fevers/chills  Objective: Vitals:   08/03/17 1658 08/03/17 1805 08/03/17 2220 08/04/17 0547  BP: (!) 133/104 (!) 143/58 115/69 (!) 114/56  Pulse:   76 62  Resp: 16  18 18   Temp: 98.2 F (36.8 C)  97.9 F (36.6 C) 98.2 F (36.8 C)  TempSrc: Oral  Oral Oral  SpO2: 100%  100% 100%  Weight:      Height:        Intake/Output Summary (Last 24 hours) at 08/04/17 1245 Last data filed at 08/04/17 0900  Gross per 24 hour  Intake              600 ml  Output             1500 ml  Net             -900 ml   Filed Weights   07/23/17 1234  Weight: 90.3 kg (199 lb)    Examination:  Vitals:   08/03/17 1658 08/03/17 1805 08/03/17 2220 08/04/17 0547  BP: (!) 133/104 (!) 143/58 115/69 (!) 114/56  Pulse:   76 62  Resp: 16  18 18   Temp: 98.2 F (36.8 C)  97.9 F (36.6 C) 98.2 F (36.8 C)  TempSrc: Oral  Oral Oral  SpO2: 100%  100% 100%  Weight:      Height:        Constitutional: NAD Respiratory: CTA  Cardiovascular: RRR  Data Reviewed: I have independently reviewed following labs and imaging studies   CBC:  Recent Labs Lab 07/30/17 0552 07/31/17 0534 08/01/17 0530 08/02/17 1640 08/03/17 0546 08/04/17 0500  WBC 16.3* 16.3* 16.6*  --  22.7* 13.3*  HGB 10.5* 11.0* 10.6* 8.9* 9.8* 9.2*  HCT 30.6* 31.6* 31.3* 26.0* 28.9* 27.6*  MCV 90.5 91.6 92.3  --  92.3 93.6  PLT 316 348 380  --  460* 127*   Basic Metabolic Panel:  Recent Labs Lab 07/30/17 0552 07/31/17 0534 08/01/17 0530 08/02/17 0600  08/03/17 0546 08/04/17 0500  NA 140 139 137  --   --  142  K 3.4* 3.6 3.8  --   --  3.7  CL 102 102 100*  --   --  100*  CO2 28 28 27   --   --  31  GLUCOSE 110* 125* 126*  --   --  141*  BUN 10 8 7   --   --  9  CREATININE 0.99 1.00 1.05* 1.01* 1.11* 1.11*  CALCIUM 9.0 8.8* 9.0  --   --  9.6  MG  --  1.9  --   --   --   --    GFR: Estimated Creatinine Clearance: 65.5 mL/min (A) (by C-G formula based on SCr of 1.11 mg/dL (H)). Liver Function Tests: No results for input(s): AST, ALT, ALKPHOS, BILITOT, PROT, ALBUMIN in the last 168 hours. No results for input(s): LIPASE, AMYLASE in the last 168 hours. No results for input(s): AMMONIA in the last 168 hours. Coagulation Profile: No results for input(s): INR, PROTIME in the last 168 hours. Cardiac Enzymes: No results for input(s): CKTOTAL, CKMB, CKMBINDEX, TROPONINI in the last 168 hours. BNP (last 3 results) No results for input(s): PROBNP in the last 8760 hours. HbA1C: No results for input(s): HGBA1C in the last 72 hours. CBG: No results for input(s): GLUCAP in the last 168 hours. Lipid Profile: No results for input(s): CHOL, HDL, LDLCALC, TRIG, CHOLHDL, LDLDIRECT in the last 72 hours. Thyroid Function Tests: No results for input(s): TSH, T4TOTAL, FREET4, T3FREE, THYROIDAB in the last 72 hours. Anemia Panel: No results for input(s): VITAMINB12, FOLATE, FERRITIN, TIBC, IRON, RETICCTPCT in the last 72 hours. Urine analysis:    Component Value Date/Time   COLORURINE AMBER (A) 07/23/2017 1350   APPEARANCEUR CLOUDY (A) 07/23/2017 1350   LABSPEC 1.018 07/23/2017 1350   PHURINE 5.5 07/23/2017 1350   GLUCOSEU NEGATIVE 07/23/2017 1350   HGBUR SMALL (A) 07/23/2017 1350   BILIRUBINUR NEGATIVE 07/23/2017 1350   KETONESUR NEGATIVE 07/23/2017 1350   PROTEINUR 30 (A) 07/23/2017 1350   UROBILINOGEN 0.2 09/23/2014 1056   NITRITE NEGATIVE 07/23/2017 1350   LEUKOCYTESUR LARGE (A) 07/23/2017 1350   Sepsis Labs: Invalid input(s):  PROCALCITONIN, LACTICIDVEN  Recent Results (from the past 240 hour(s))  Body fluid culture     Status: None   Collection Time: 07/29/17  3:30 PM  Result Value Ref Range Status   Specimen Description SYNOVIAL LEFT HIP  Final   Special Requests NONE  Final   Gram Stain   Final    ABUNDANT WBC PRESENT,BOTH PMN AND MONONUCLEAR FEW GRAM POSITIVE COCCI IN PAIRS IN CLUSTERS    Culture   Final    NO GROWTH 3 DAYS Performed at [facility], [address], [place], [place] [address]    Report Status 08/02/2017 FINAL  Final  MRSA PCR Screening     Status: Abnormal   Collection Time: 07/31/17 10:25 PM  Result Value Ref Range Status   MRSA by PCR POSITIVE (A) NEGATIVE Final    Comment:        The GeneXpert MRSA Assay (FDA approved for NASAL specimens only), is one component of a comprehensive MRSA colonization surveillance program. It is not intended to diagnose MRSA infection nor to guide or monitor treatment for MRSA infections. RESULT CALLED TO, READ BACK BY AND VERIFIED WITH: ONEILL,M RN 8.9.18 @0026  ZANDO,C   Aerobic/Anaerobic Culture (surgical/deep wound)     Status: None (Preliminary result)   Collection Time: 08/02/17  3:44 PM  Result Value Ref Range Status   Specimen Description WOUND LEFT HIP  Final   Special Requests NONE  Final   Gram Stain   Final    RARE WBC PRESENT, PREDOMINANTLY PMN NO ORGANISMS SEEN    Culture   Final    NO GROWTH < 24 HOURS Performed at [facility], [place] [address] [place], [place] [address]    Report Status PENDING  Incomplete      Radiology Studies: Dg C-arm 1-60 Min-no Report  Result Date: 08/02/2017 Fluoroscopy was utilized by the requesting physician.  No radiographic interpretation.   Dg Hip Operative Unilat With Pelvis Left  Result Date: 08/02/2017 CLINICAL DATA:  Left hip incision and drainage. EXAM: OPERATIVE LEFT HIP (WITH PELVIS IF PERFORMED) 1 VIEW TECHNIQUE: Fluoroscopic spot image(s) were submitted for  interpretation post-operatively. COMPARISON:  Pelvic x-rays dated July 26, 2017. FLUOROSCOPY TIME:  5 seconds. C-arm fluoroscopic images were obtained intraoperatively and submitted for post operative interpretation. FINDINGS: Intraoperative x-rays demonstrate left total hip arthroplasty with well aligned components. No definite evidence of hardware complication. IMPRESSION: Left total hip arthroplasty without evidence of hardware complication. Electronically Signed   By: Titus Dubin M.D.   On: 08/02/2017 17:00     Scheduled Meds: . aspirin EC  81 mg Oral Daily  . Chlorhexidine Gluconate Cloth  6 each Topical Q0600  . citalopram  20 mg Oral q morning - 10a  . dolutegravir  50 mg Oral BID  . emtricitabine-tenofovir AF  1 tablet Oral Daily  . ferrous sulfate  325 mg Oral BID  . heparin  5,000 Units Subcutaneous Q8H  . maraviroc  300 mg Oral BID  . multivitamin with minerals  1 tablet Oral Daily  . mupirocin ointment  1 application Nasal BID  . pantoprazole  40 mg Oral Daily   Continuous Infusions: .  ceFAZolin (ANCEF) IV Stopped (08/04/17 8295)  . vancomycin Stopped (08/04/17 0349)     Marzetta Board, MD, PhD [facility] Pager [phone]  If 7PM-7AM, please contact night-coverage [email or web address] Password [facility] 08/04/2017, 12:45 PM

## 2017-08-04 NOTE — Discharge Instructions (Signed)
Expect hip drainage from the incision. You can get your current dressing wet in the shower daily. Try to leave this dressing on until your follow-up, but if too much drainage, may remove and place new dry dressing as needed.

## 2017-08-05 ENCOUNTER — Encounter (HOSPITAL_COMMUNITY): Payer: Self-pay | Admitting: Orthopaedic Surgery

## 2017-08-05 DIAGNOSIS — Z419 Encounter for procedure for purposes other than remedying health state, unspecified: Secondary | ICD-10-CM

## 2017-08-05 DIAGNOSIS — N10 Acute tubulo-interstitial nephritis: Secondary | ICD-10-CM

## 2017-08-05 DIAGNOSIS — T8459XS Infection and inflammatory reaction due to other internal joint prosthesis, sequela: Secondary | ICD-10-CM

## 2017-08-05 DIAGNOSIS — Z96649 Presence of unspecified artificial hip joint: Secondary | ICD-10-CM

## 2017-08-05 MED ORDER — CYCLOBENZAPRINE HCL 5 MG PO TABS: 5.0000 mg | ORAL_TABLET | Freq: Three times a day (TID) | ORAL | 0 refills | Status: DC | PRN

## 2017-08-05 MED ORDER — VANCOMYCIN IV (FOR PTA / DISCHARGE USE ONLY): 750.0000 mg | Freq: Two times a day (BID) | INTRAVENOUS | 0 refills | Status: AC

## 2017-08-05 MED ORDER — OXYCODONE HCL 10 MG PO TABS: 10.0000 mg | ORAL_TABLET | ORAL | 0 refills | Status: DC | PRN

## 2017-08-05 MED ORDER — CEFAZOLIN IV (FOR PTA / DISCHARGE USE ONLY): 2.0000 g | Freq: Three times a day (TID) | INTRAVENOUS | 0 refills | Status: AC

## 2017-08-05 NOTE — Progress Notes (Signed)
[name] for Infectious Disease    Date of Admission:  07/23/2017      ID: Ashley Shepherd is a 61 y.o. female with  pji Principal Problem:   Pyelonephritis Active Problems:   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[place])   HIV disease ([place])   CKD (chronic kidney disease) stage 3, GFR 30-59 ml/min   Depression with anxiety   Acute pyelonephritis   Klebsiella sepsis ([place])   Abscess of left hip   Diagnosis: Prosthetic joint infection  Culture Result: culture negative but gram stain +  Allergies  Allergen Reactions  . Codeine Itching  . Ivp Dye [Iodinated Diagnostic Agents] Itching    Pt stated that she had NO problem/reaction to topical iodine/betadine solution.  . Sulfa Antibiotics Itching    OPAT Orders Discharge antibiotics: Per pharmacy protocol  Cefazolin 2gm Q 8 IV plus IV vanco Aim for Vancomycin trough 15-20 (unless otherwise indicated) Duration: 6 wk End Date: 09/14/2017  [facility] Care Per Protocol:  Labs weekly while on IV antibiotics: _x_ CBC with differential __x BMP- twice per week __ CMP _x_ CRP _x_ ESR _x_ Vancomycin trough  x__ Please pull PIC at completion of IV antibiotics   Fax weekly labs to [phone]  Clinic Follow Up Appt: Dr Lety Cullens in [place]  @   [place] for Infectious Diseases Cell: [phone] Pager: [phone]  08/05/2017, 10:02 AM

## 2017-08-05 NOTE — Progress Notes (Signed)
Patient ID: Ashley Shepherd, female   DOB: 06/15/1956, 61 y.o.   MRN: [id number] No acute changes.  Moderate drainage from left hip incision to be expected.  Can be discharged to home today from Ortho standpoint.

## 2017-08-05 NOTE — Progress Notes (Signed)
Reivewed AVS and discharge instructions w/pt and husband. They verbalized understanding and provided teachback. Pt was discharged home with pain controlled in stable condition w/prescriptions and all belongings via [place].

## 2017-08-05 NOTE — Progress Notes (Signed)
PHARMACY CONSULT NOTE FOR:  OUTPATIENT  PARENTERAL ANTIBIOTIC THERAPY (OPAT)  Indication: PJI, pyelonephritis  Regimen: Cefazolin 2g IV q8h, Vancomycin 750mg  IV q12h End date: 09/14/17  Recommend checking vancomycin trough level ASAP after discharge.  She was scheduled for a trough level at steady state on current dose on 8/13 before her 3:00PM dose, but will be discharged prior to that time.  IV antibiotic discharge orders are pended. To discharging provider:  please sign these orders via discharge navigator,  Select New Orders & click on the button choice - Manage This Unsigned Work.     Thank you for allowing pharmacy to be a part of this patient's care.  Leeroy Bock 08/05/2017, 11:03 AM

## 2017-08-05 NOTE — Care Management Important Message (Signed)
Important Message  Patient Details  Name: Ashley Shepherd MRN: [id number] Date of Birth: 1956-03-23   Medicare Important Message Given:  Yes    Kerin Salen 08/05/2017, 11:32 AMImportant Message  Patient Details  Name: Ashley Shepherd MRN: [id number] Date of Birth: 1956-07-26   Medicare Important Message Given:  Yes    Kerin Salen 08/05/2017, 11:32 AM

## 2017-08-05 NOTE — Discharge Summary (Signed)
Physician Discharge Summary  Ashley Shepherd BHA:[id number] DOB: 06-Jan-1956 DOA: 07/23/2017  PCP: Marcia Brash, MD (Inactive)  Admit date: 07/23/2017 Discharge date: 08/05/2017  Admitted From: home Disposition:  home  Recommendations for Outpatient Follow-up:  1. Follow up with Dr. Ninfa Linden with orthopedic surgery in 2 weeks 2. Follow-up with infectious disease at [facility] as scheduled 3. Patient discharged home with a PICC line and IV antibiotics with vancomycin and cefazolin for 6 weeks with end date September 14, 2017  Home Health: RN Equipment/Devices: None  Discharge Condition: Stable CODE STATUS: Full code Diet recommendation: Regular  HPI: Per Dr. Myna Hidalgo, Ashley Shepherd is a 61 y.o. 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 Patient reports that she had been in her usual state of health when she noted the insidious development nonspecific malaise, followed by fevers, dysuria, and pain at the left flank described as constant, moderate to severe, localized, and without any alleviating or exacerbating factors identified. She denies any shortness of breath, but notes a mild nonproductive cough. She reports a mild transient headache associated with the fever that has since resolved. She denies any chest pain or palpitations. She denies any change in vision or hearing, and denies any focal numbness or weakness. She reports strict adherence with her HIV medications and has an upcoming appointment with ID as routine follow-up. Viral load has been consistently undetectable at her recent visits. CD4 was 478 in February 2018. [facility] ED Course: Upon arrival to the [facility] ED, patient is found to be febrile to 39.2 C, saturating well on room air,  and with vitals otherwise stable. Chest x-Randazzo is negative for acute cardiopulmonary disease. Urinalysis is suggestive of infection and features hemoglobin on the dipstick. CBC is notable for a creatinine 1.48, up from 1.36 a year ago. CBC features a leukocytosis of 16,900. Lactic acid is minimally elevated to 2.02. Blood and urine cultures were collected in the ED, 1 L of normal saline was given, fever was treated with 1 g acetaminophen, and the patient was started on antibiotics with Levaquin 750 mg IV. Fever improved with the Tylenol, patient remained hemodynamically stable and in no apparent respiratory distress, and will be observed in the hospital for ongoing evaluation and management of fevers, flank pain, and dysuria concerning for pyelonephritis.   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  She was also found to have left-sided hydronephrosis and urology was consulted.  They felt like the hydronephrosis appears to be chronic as it was present on prior scans.  Her urine cultures were positive for Klebsiella which was only resistant to ampicillin. Septic left hip with hip prosthesis in place -patient has been having progressive left hip pain, and underwent imaging with concerning for hip joint infection.  Orthopedic  surgery was consulted and have aspirated the hip.  Gram stain showed gram-positive organisms however final cultures have not identify a specific pathogen.  Infectious disease  was consulted and have follow patient while hospitalized.  Patient was eventually taken to the operating room on 8/10 for formal I&D of the left hip and also had antibiotic beads with vancomycin placed.  Following the I&D, patient recovered well, able to ambulate with minimal pain.  Infectious disease recommended vancomycin and cefazolin for treatment, will need 6 weeks with IV antibiotics and a PICC line was placed.  She will be followed up as an outpatient, and will need suppressive antibiotics long-term following treatment HIV -Well-managed, viral load was undetectable and CD4 was 478 earlier this year.  She is followed by Dr. Caryn Section at [facility]. Chronic kidney disease stage III -Creatinine appears at baseline   Discharge Instructions  Discharge Instructions    Home infusion instructions [facility] May follow [place] Dosing Protocol; May administer Cathflo as needed to maintain patency of vascular access device.; Flushing of vascular access device: per [place] Protocol: 0.9% NaCl pre/post medica...    Complete by:  As directed    Instructions:  May follow [place] Dosing Protocol   Instructions:  May administer Cathflo as needed to maintain patency of vascular access device.   Instructions:  Flushing of vascular access device: per [facility] Protocol: 0.9% NaCl pre/post medication administration and prn patency; Heparin 100 u/ml, 63m for implanted ports and Heparin 10u/ml, 527mfor all other central venous catheters.   Instructions:  May follow [facility] Anaphylaxis Protocol for First Dose Administration in the home: 0.9% NaCl at 25-50 ml/hr to maintain IV access for protocol meds. Epinephrine 0.3 ml IV/IM PRN and Benadryl 25-50 IV/IM PRN s/s of anaphylaxis.   Instructions:  [facility] Coordinator (RN) to assist per patient IV care needs in the home PRN.     Allergies as of 08/05/2017      Reactions   Codeine Itching   Ivp Dye [iodinated Diagnostic Agents] Itching   Pt stated  that she had NO problem/reaction to topical iodine/betadine solution.   Sulfa Antibiotics Itching      Medication List    TAKE these medications   ALPRAZolam 1 MG tablet Commonly known as:  XANAX Take 0.5 mg by mouth at bedtime as needed for anxiety.   aspirin EC 81 MG tablet Take 81 mg by mouth daily.   CALCIUM + D PO Take 1 tablet by mouth daily.   ceFAZolin IVPB Commonly known as:  ANCEF Inject 2 g into the vein every 8 (eight) hours. Indication:  PJI, pyelonephritis Last Day of Therapy:  09/14/17 Labs - Once weekly:  CBC/D and BMP, Labs - Every other week:  ESR and CRP   citalopram 20 MG tablet Commonly known as:  CELEXA Take 20 mg by mouth every morning.   cyclobenzaprine 5 MG tablet Commonly known as:  FLEXERIL Take 1 tablet (5 mg total) by mouth 3 (three) times daily as needed for muscle spasms.   DESCOVY 200-25 MG tablet Generic drug:  emtricitabine-tenofovir AF Take 1 tablet by mouth daily.   dicyclomine 10 MG capsule Commonly known as:  BENTYL Take 10 mg by mouth 4 (four) times daily as needed for spasms.   ferrous sulfate 325 (65 FE) MG tablet Take 325 mg by mouth 2 (two) times daily.   maraviroc 300 MG tablet Commonly known as:  SELZENTRY Take 300 mg by mouth 2 (two) times daily.  Reported on 01/24/2016   multivitamin with minerals Tabs tablet Take 1 tablet by mouth daily.   ondansetron 4 MG tablet Commonly known as:  ZOFRAN Take 1 tablet (4 mg total) by mouth every 6 (six) hours as needed for nausea or vomiting.   Oxycodone HCl 10 MG Tabs Take 1-1.5 tablets (10-15 mg total) by mouth every 3 (three) hours as needed for severe pain.   pantoprazole 40 MG tablet Commonly known as:  PROTONIX Take 40 mg by mouth daily.   TIVICAY 50 MG tablet Generic drug:  dolutegravir Take 50 mg by mouth 2 (two) times daily.   vancomycin IVPB Inject 750 mg into the vein every 12 (twelve) hours. Indication:  PJI, pyelonephritis Last Day of Therapy:  09/14/17 Labs -  _0 /13/18 1106     Follow-up Information    Mcarthur Rossetti, MD. Schedule an appointment as soon as possible for a visit in 2 week(s).   Specialty:  Orthopedic Surgery Contact information: [place]  [address] [phone]        [facility] Follow up.   Why:  nurse for IV antibiotics Contact information: [place] [address] [phone]          Allergies  Allergen Reactions  . Codeine Itching  . Ivp Dye [Iodinated Diagnostic Agents] Itching    Pt stated that she had NO problem/reaction to topical iodine/betadine solution.  . Sulfa Antibiotics Itching     Consultations:  Urology  Orthopedic surgery  Infectious disease  Procedures/Studies:  I&D left hip 8/10  Ct Abdomen Pelvis Wo Contrast  Result Date: 07/25/2017 CLINICAL DATA:  Abdominal pain, fever, HIV/AIDS EXAM: CT ABDOMEN AND PELVIS WITHOUT CONTRAST TECHNIQUE: Multidetector CT imaging of the abdomen and pelvis was performed following the standard protocol without IV contrast. COMPARISON:  09/24/2016 FINDINGS: Lower chest: Lung bases are essentially clear. Hepatobiliary: Unenhanced liver is unremarkable. Gallbladder is unremarkable. No intrahepatic or extrahepatic duct dilatation. Pancreas: Within normal limits. Spleen: Within normal limits. Adrenals/Urinary Tract: Adrenal glands are within normal limits. 3 mm nonobstructing left upper pole renal calculus (series 2/ image 20). Additional punctate nonobstructing bilateral upper pole renal calculi (series 2/ image 24).  Mild bilateral hydronephrosis. Bladder is within normal limits, noting trace nondependent gas. Stomach/Bowel: Stomach is notable for a small hiatal hernia. No evidence of bowel obstruction. Appendix is not discretely visualized. Vascular/Lymphatic: No evidence of abdominal aortic aneurysm. No suspicious abdominopelvic lymphadenopathy. Reproductive: Uterus is within normal limits. No adnexal masses. Other: No abdominopelvic ascites. No evidence of intra-abdominal abscess. Musculoskeletal: Thoracolumbar spine is within normal limits. Avascular necrosis of the right femoral head. Left hip arthroplasty. Associated fluid collection anterior to the left hip prosthesis (series 2/ image 81), mildly progressive from 2017, with tiny foci of gas (series 2/image 70). IMPRESSION: No evidence of intra-abdominal abscess. Fluid anterior to the left hip prosthesis with tiny foci of gas, mildly progressive when compared to 2017. While sterility is indeterminate, if there are clinical findings supporting infection in this location, consider  aspiration. Bilateral renal calculi, measuring up to 3 mm on the left. Mild bilateral hydronephrosis. Electronically Signed   By: Julian Hy M.D.   On: 07/25/2017 20:34   Dg Chest 2 View  Result Date: 07/23/2017 CLINICAL DATA:  Low-grade fever.  Cough. EXAM: CHEST  2 VIEW COMPARISON:  04/02/2017. FINDINGS: Mediastinum and hilar structures normal. Lungs are clear. No pleural effusion or pneumothorax. Heart size normal. Biapical pleural thickening noted consistent with scarring . IMPRESSION: No acute cardiopulmonary disease. Electronically Signed   By: Marcello Moores  Register   On: 07/23/2017 12:56   Dg Pelvis Portable  Result Date: 07/26/2017 CLINICAL DATA:  61 y/o F; history of left total hip revision and left hip infection. Left hip pain started this morning. No injury or fever. EXAM: PORTABLE PELVIS 1-2 VIEWS COMPARISON:  07/25/2017 CT abdomen and pelvis. FINDINGS: Total left hip arthroplasty. No periprosthetic lucency or fracture is identified. The right hip joint space is maintained. Avascular necrosis of the right femoral head. No acute fracture or diastases. IMPRESSION: 1. Total left hip arthroplasty. No periprosthetic lucency or fracture is identified. 2. Avascular necrosis of the right femoral head. 3. No acute fracture or pelvic diastases. Electronically Signed   By: Kristine Garbe M.D.   On: 07/26/2017 14:06   Dg Fluoro Guided Needle Plc Aspiration/injection Loc  Result Date: 07/29/2017 CLINICAL DATA:  Left hip infection. Leukocytosis. Persistent fever. EXAM: LEFT HIP ARTHROCENTESIS UNDER FLUOROSCOPY FLUOROSCOPY TIME:  Fluoroscopy Time:  0 minutes, 38 seconds Radiation Exposure Index (if provided by the fluoroscopic device): 16 mGy Number of Acquired Spot Images: 0 PROCEDURE: I discussed the risks (including hemorrhage and infection, among others), benefits, and alternatives to the procedure with the patient. We specifically discussed the high technical likelihood of success of the  procedure. The patient understood and elected to undergo the procedure. Standard time-out was employed and left side confirmed and marked. Following sterile skin prep and local anesthetic administration consisting of 1% lidocaine, a 20 gauge needle was advanced without difficulty along the lateral edge of the base of the neck of the left femoral component of the hip prosthesis under fluoroscopic guidance. A total of 23 cc of opaque cloudy orange fluid was aspirated. The needle was subsequently removed and the skin cleansed and bandaged. No immediate complications were observed. IMPRESSION: 1. Technically successful left hip aspiration under fluoroscopy, yielding 23 cc of opaque cloudy orange fluid which was sent for requested lab analysis. Electronically Signed   By: [place] Clines M.D.   On: 07/29/2017 16:01   Dg C-arm 1-60 Min-no Report  Result Date: 08/02/2017 Fluoroscopy was utilized by the requesting physician.  No radiographic interpretation.   Dg Hip  Operative Unilat With Pelvis Left  Result Date: 08/02/2017 CLINICAL DATA:  Left hip incision and drainage. EXAM: OPERATIVE LEFT HIP (WITH PELVIS IF PERFORMED) 1 VIEW TECHNIQUE: Fluoroscopic spot image(s) were submitted for interpretation post-operatively. COMPARISON:  Pelvic x-rays dated July 26, 2017. FLUOROSCOPY TIME:  5 seconds. C-arm fluoroscopic images were obtained intraoperatively and submitted for post operative interpretation. FINDINGS: Intraoperative x-rays demonstrate left total hip arthroplasty with well aligned components. No definite evidence of hardware complication. IMPRESSION: Left total hip arthroplasty without evidence of hardware complication. Electronically Signed   By: Titus Dubin M.D.   On: 08/02/2017 17:00     Subjective: - no chest pain, shortness of breath, no abdominal pain, nausea or vomiting.   Discharge Exam: Vitals:   08/04/17 2116 08/05/17 0632  BP: 121/65 127/61  Pulse: (!) 57 77  Resp: 16 16  Temp:  98.2 F (36.8 C) 98.6 F (37 C)  SpO2: 100% 100%   Vitals:   08/04/17 0547 08/04/17 1400 08/04/17 2116 08/05/17 0632  BP: (!) 114/56 119/79 121/65 127/61  Pulse: 62 (!) 59 (!) 57 77  Resp: _0 Temp: 98.2 F (36.8 C) 98.5 F (36.9 C) 98.2 F (36.8 C) 98.6 F (37 C)  TempSrc: Oral Oral Oral Oral  SpO2: 100% 100% 100% 100%  Weight:      Height:        General: Pt is alert, awake, not in acute distress Cardiovascular: RRR, S1/S2 +, no rubs, no gallops Respiratory: CTA bilaterally, no wheezing, no rhonchi Abdominal: Soft, NT, ND, bowel sounds + Extremities: no edema, no cyanosis    The results of significant diagnostics from this hospitalization (including imaging, microbiology, ancillary and laboratory) are listed below for reference.     Microbiology: Recent Results (from the past 240 hour(s))  Body fluid culture     Status: None   Collection Time: 07/29/17  3:30 PM  Result Value Ref Range Status   Specimen Description SYNOVIAL LEFT HIP  Final   Special Requests NONE  Final   Gram Stain   Final    ABUNDANT WBC PRESENT,BOTH PMN AND MONONUCLEAR FEW GRAM POSITIVE COCCI IN PAIRS IN CLUSTERS    Culture   Final    NO GROWTH 3 DAYS Performed at [facility], [place] [address], [place], [place] [address]    Report Status 08/02/2017 FINAL  Final  MRSA PCR Screening     Status: Abnormal   Collection Time: 07/31/17 10:25 PM  Result Value Ref Range Status   MRSA by PCR POSITIVE (A) NEGATIVE Final    Comment:        The GeneXpert MRSA Assay (FDA approved for NASAL specimens only), is one component of a comprehensive MRSA colonization surveillance program. It is not intended to diagnose MRSA infection nor to guide or monitor treatment for MRSA infections. RESULT CALLED TO, READ BACK BY AND VERIFIED WITH: ONEILL,M RN 8.9.18 _1  ZANDO,C   Aerobic/Anaerobic Culture (surgical/deep wound)     Status: None (Preliminary result)   Collection Time: 08/02/17  3:44  PM  Result Value Ref Range Status   Specimen Description WOUND LEFT HIP  Final   Special Requests NONE  Final   Gram Stain   Final    RARE WBC PRESENT, PREDOMINANTLY PMN NO ORGANISMS SEEN    Culture   Final    NO GROWTH 2 DAYS Performed at [facility], [place] [address] [place], [place] [address]    Report Status PENDING  Incomplete     Labs: BNP (last 3 results) No results for input(s): BNP in the last 8760 hours. Basic Metabolic Panel:  Recent Labs Lab 07/30/17 0552 07/31/17 0534 08/01/17 0530 08/02/17 0600 08/03/17 0546 08/04/17 0500  NA 140 139 137  --   --  142  K 3.4* 3.6 3.8  --   --  3.7  CL 102 102 100*  --   --  100*  CO2 _0 --   --  31  GLUCOSE 110* 125* 126*  --   --  141*  BUN _1 --   --  9  CREATININE 0.99 1.00 1.05* 1.01* 1.11* 1.11*  CALCIUM 9.0 8.8* 9.0  --   --  9.6  MG  --  1.9  --   --   --   --    Liver Function Tests: No results for input(s): AST, ALT, ALKPHOS, BILITOT, PROT, ALBUMIN in the last 168 hours. No results for input(s): LIPASE, AMYLASE in the last 168 hours. No results for input(s): AMMONIA in the last 168 hours. CBC:  Recent Labs Lab 07/30/17 0552 07/31/17 0534 08/01/17 0530 08/02/17 1640 08/03/17 0546 08/04/17 0500  WBC 16.3* 16.3* 16.6*  --  22.7* 13.3*  HGB 10.5* 11.0* 10.6* 8.9* 9.8* 9.2*  HCT 30.6* 31.6* 31.3* 26.0* 28.9* 27.6*  MCV 90.5 91.6 92.3  --  92.3 93.6  PLT 316 348 380  --  460* 441*   Cardiac Enzymes: No results for input(s): CKTOTAL, CKMB, CKMBINDEX, TROPONINI in the last 168 hours. BNP: Invalid input(s): POCBNP CBG: No results for input(s): GLUCAP in the last 168 hours. D-Dimer No results for input(s): DDIMER in the last 72 hours. Hgb A1c No results for input(s): HGBA1C in the last 72 hours. Lipid Profile No results for input(s): CHOL, HDL, LDLCALC, TRIG, CHOLHDL, LDLDIRECT in the last 72 hours. Thyroid function studies No results for input(s): TSH, T4TOTAL, T3FREE, THYROIDAB in  the last 72 hours.  Invalid input(s): FREET3 Anemia work up No results for input(s): VITAMINB12, FOLATE, FERRITIN, TIBC, IRON, RETICCTPCT in the last 72 hours. Urinalysis    Component Value Date/Time   COLORURINE AMBER (A) 07/23/2017 1350   APPEARANCEUR CLOUDY (A) 07/23/2017 1350   LABSPEC 1.018 07/23/2017 1350   PHURINE 5.5 07/23/2017 1350   GLUCOSEU NEGATIVE 07/23/2017 1350   HGBUR SMALL (A) 07/23/2017 1350   BILIRUBINUR NEGATIVE 07/23/2017 1350   KETONESUR NEGATIVE 07/23/2017 1350   PROTEINUR 30 (A) 07/23/2017 1350   UROBILINOGEN 0.2 09/23/2014 1056   NITRITE NEGATIVE 07/23/2017 1350   LEUKOCYTESUR LARGE (A) 07/23/2017 1350   Sepsis Labs Invalid input(s): PROCALCITONIN,  WBC,  LACTICIDVEN Microbiology Recent Results (from the past 240 hour(s))  Body fluid culture     Status: None   Collection Time: 07/29/17  3:30 PM  Result Value Ref Range Status   Specimen Description SYNOVIAL LEFT HIP  Final   Special Requests NONE  Final   Gram Stain   Final    ABUNDANT WBC PRESENT,BOTH PMN AND MONONUCLEAR FEW GRAM POSITIVE COCCI IN PAIRS IN CLUSTERS    Culture   Final    NO GROWTH 3 DAYS Performed at [facility], [place] [address], [place], [place] [address]    Report Status 08/02/2017 FINAL  Final  MRSA PCR Screening     Status: Abnormal   Collection Time: 07/31/17 10:25 PM  Result Value Ref Range Status   MRSA by PCR POSITIVE (A) NEGATIVE Final  Comment:        The GeneXpert MRSA Assay (FDA approved for NASAL specimens only), is one component of a comprehensive MRSA colonization surveillance program. It is not intended to diagnose MRSA infection nor to guide or monitor treatment for MRSA infections. RESULT CALLED TO, READ BACK BY AND VERIFIED WITH: ONEILL,M RN 8.9.18 _0  ZANDO,C   Aerobic/Anaerobic Culture (surgical/deep wound)     Status: None (Preliminary result)   Collection Time: 08/02/17  3:44 PM  Result Value Ref Range Status   Specimen Description  WOUND LEFT HIP  Final   Special Requests NONE  Final   Gram Stain   Final    RARE WBC PRESENT, PREDOMINANTLY PMN NO ORGANISMS SEEN    Culture   Final    NO GROWTH 2 DAYS Performed at [facility], [place] [address] [place], [place] [address]    Report Status PENDING  Incomplete     Time coordinating discharge: 45 minutes  SIGNED:  Marzetta Board, MD  [facility] 08/05/2017, 1:14 PM Pager [phone]  If 7PM-7AM, please contact night-coverage [email or web address] Password [email or web address]

## 2017-08-05 NOTE — Progress Notes (Signed)
Spoke with patient and spouse at bedside. Discussed d/c today, IV abx at home. Patient has chosen [facility] for [facility] services. Contacted [facility] for referral. Awaiting input from ID regarding abx.

## 2017-08-05 NOTE — Op Note (Signed)
NAME:  Ashley Shepherd, Ashley Shepherd                       ACCOUNT NO.:  MEDICAL RECORD NO.:  [id number]  LOCATION:                                 FACILITY:  PHYSICIAN:  Lind Guest. Ninfa Linden, M.D.DATE OF BIRTH:  DATE OF PROCEDURE:  07/23/2017 DATE OF DISCHARGE:                              OPERATIVE REPORT   PREOPERATIVE DIAGNOSES: 1. Irrigation and debridement of left hip joint including prosthetic     hip components with sharp excisional debridement of fascia and     capsular tissue only. 2. Placement of antibiotic Stimulan beads with vancomycin beads into     left hip arthrotomy.  SURGEON:  Lind Guest. Ninfa Linden, M.D.  ASSISTANT:  Erskine Emery, PA-C.  ANESTHESIA:  General.  BLOOD LOSS:  200 mL.  COMPLICATIONS:  None.  INDICATIONS:  Ms. Bishara is a very well known to me.  She is a 61 year old female with well-controlled HIV status.  She actually had a total hip arthroplasty done over a year ago of her left hip.  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  That hip actually did well first, but then she did develop an infection that actually grew out a strep infection.  At that time given her HIV status, we still decided to be aggressive and took out all hip components and placed antibiotic spacer.  In April of 2017, we went back to the operating room and found the sterile hip joint, so we performed a revision arthroplasty of all components.  She has done well until about 2 weeks ago and then she developed the urinary tract infection.  She then developed left hip pain that she said it was not present before.  She presented to the emergency room and was treated for the urinary tract infection, but she had persistent fevers and high white blood count.  Even though her hip pain was minimal, she was developing some hip pain.  An aspiration was performed and did show some rare gram-positive cocci.  CT scan showed questionable fluid collection around the hip, which may be  postoperative changes from last year; however, given the failure of other treatment methods, the thoughts of opening up the hip and washing out completely with attempt to salvage the joint what has been recommended.  Obviously, she is MRSA positive and that makes her situation certainly complicated for the fact that if we do take out all components this may need to come out permanently.  Right now, we are going to attempt salvage of the hip with hopefully chronic suppression of her infection.  PROCEDURE DESCRIPTION:  After informed consent was obtained, appropriate left hip was marked.  She was brought to the operating room.  General anesthesia was obtained while she was on her stretcher.  Traction boots were placed on both of her feet.  Next, she was placed supine on the Hana fracture table with the perineal post in place with no traction applied.  Her left operative hip was examined and I did feel that the skin was indurated, which had changed from a week ago.  After prepping with DuraPrep and sterile drapes, time-out was called and she  was identified as correct patient and correct left hip.  We then made an incision through her previous incision and dissected down the soft tissue all the way down to the tensor fascia area.  I did find a large fluid collection and we sent off new cultures, but it was concerning for a deeper infection.  We opened up all the way down to the hip joint itself.  We then dislocated the hip, and I could not remove the hip ball at all, it cold-welded onto the hip stem and after much attempts to knock this ball __________ that part of the case because I felt that if we tried any harder, we would dislodge the stem.  __________ good and her hopes that we can try to fight this infection and suppress it.  I used a #10 blade and sharply excised just minimal necrotic fascia in hip joint capsule sharply.  We then irrigated the hip thoroughly from superficial to deep  with a total of 7 L normal saline solution using pulsatile lavage.  We then mixed some Stimulan antibiotic beads with vancomycin and placed these down into the arthrotomy.  We then closed the deep tissue with #1 Vicryl suture followed by 0 Vicryl in the deep tissue, 2-0 Vicryl subcutaneous tissue, interrupted 2-0 nylon on the skin.  Xeroform and Aquacel dressing were applied.  She was awakened, extubated and taken to the recovery room in stable condition.  All final counts were correct.  There were no complications noted.  Of note, Erskine Emery, PA-C assisted in the entire case.  His assistance was crucial for facilitating all aspects of this case.     Lind Guest. Ninfa Linden, M.D.     CYB/MEDQ  D:  08/02/2017  T:  08/02/2017  Job:  [id number]

## 2017-08-07 LAB — AEROBIC/ANAEROBIC CULTURE W GRAM STAIN (SURGICAL/DEEP WOUND): Culture: NO GROWTH

## 2017-08-07 LAB — AEROBIC/ANAEROBIC CULTURE (SURGICAL/DEEP WOUND)

## 2017-08-12 ENCOUNTER — Other Ambulatory Visit: Payer: Self-pay | Admitting: Pharmacist

## 2017-08-12 ENCOUNTER — Telehealth: Payer: Self-pay | Admitting: Internal Medicine

## 2017-08-12 ENCOUNTER — Emergency Department (HOSPITAL_BASED_OUTPATIENT_CLINIC_OR_DEPARTMENT_OTHER)
Admission: EM | Admit: 2017-08-12 | Discharge: 2017-08-12 | Disposition: A | Discharge status: Home / Self Care | Payer: Medicare Other | Attending: Emergency Medicine | Admitting: Emergency Medicine

## 2017-08-12 ENCOUNTER — Encounter (HOSPITAL_BASED_OUTPATIENT_CLINIC_OR_DEPARTMENT_OTHER): Payer: Self-pay

## 2017-08-12 DIAGNOSIS — F17210 Nicotine dependence, cigarettes, uncomplicated: Secondary | ICD-10-CM | POA: Diagnosis not present

## 2017-08-12 DIAGNOSIS — T82868A Thrombosis of vascular prosthetic devices, implants and grafts, initial encounter: Secondary | ICD-10-CM | POA: Insufficient documentation

## 2017-08-12 DIAGNOSIS — Z79899 Other long term (current) drug therapy: Secondary | ICD-10-CM | POA: Diagnosis not present

## 2017-08-12 DIAGNOSIS — Y829 Unspecified medical devices associated with adverse incidents: Secondary | ICD-10-CM | POA: Diagnosis not present

## 2017-08-12 DIAGNOSIS — Z7982 Long term (current) use of aspirin: Secondary | ICD-10-CM | POA: Insufficient documentation

## 2017-08-12 DIAGNOSIS — T82898A Other specified complication of vascular prosthetic devices, implants and grafts, initial encounter: Secondary | ICD-10-CM

## 2017-08-12 LAB — VANCOMYCIN, RANDOM: VANCOMYCIN RM: 12

## 2017-08-12 NOTE — Discharge Instructions (Signed)
CONTINUE YOUR CEFAZOLIN AS DIRECTED.  DO NOT START BACK THE VANCOMYCIN UNTIL YOU HEAR BACK FROM DR. SNIDER.

## 2017-08-12 NOTE — Telephone Encounter (Signed)
I received a call over the weekend from home health rn stating that the patient's picc line unable to draw back blood in order to do vancomycin TDM. Unable to get cathflo at home due to insurance.  I have spoken to patient this morning to stop doing vancomycin infusion since we are unclear what her TDM level is if supratherapeutic since last draw > 7 d. I have asked her to go to nearest emergency room to troubleshoot line be it get cathflo vs. Replacing picc line.

## 2017-08-12 NOTE — ED Notes (Signed)
verbal understanding of d/c instructions from pt, MD to follow up call with vanc results and plan for picc

## 2017-08-12 NOTE — ED Triage Notes (Signed)
Pt states she was advised by ID MD to come to ED to have PICC line checked-[facility] was unable to use PICC line 8/16 per pt-NAD-steady gait

## 2017-08-12 NOTE — ED Provider Notes (Signed)
[facility] Provider Note   CSN: [id number] Arrival date & time: 08/12/17  1224     History   Chief Complaint Chief Complaint  Patient presents with  . Vascular Access Problem    HPI Ashley Shepherd is a 61 y.o. female.  Patient is a 61 year old female with HIV who was recently admitted for pyelonephritis and septic joint of the left hip. She was taken operating room where the hip was washed out and antibiotic beads were placed. She states that she is doing well. There is no fevers at home. Her pain is well controlled. She has a PICC line in place and is receiving 6 weeks of antibiotic therapy including ancef and vancomycin. Over the last week, the home health nurse has been unable to draw back blood from the PICC line so her vancomycin levels have not been checked. The patient is continuing to receive the antibiotics however. Dr. Baxter Flattery with infectious disease called the patient this morning and told her not to take her vancomycin dose today and that she needed to have her vancomycin levels checked and her PICC line evaluated. She was advised to proceed to the emergency department for this evaluation. She last received her antibiotics last night.      Past Medical History:  Diagnosis Date  . Anemia   . Anxiety   . Arthritis   . 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[place])    left  . Cancer ([place])    7 years ago, cancer free now  . Depression   . Difficulty sleeping   . GERD (gastroesophageal reflux disease)   . History of kidney stones   . History of transfusion   . HIV disease ([place])   . Kidney stones   . Pneumonia     Patient Active Problem List   Diagnosis Date Noted  . Surgery, elective   . Abscess of left hip   . Klebsiella sepsis ([place]) 07/27/2017  . Pyelonephritis 07/23/2017  . Depression with anxiety 07/23/2017  . Acute pyelonephritis   . Encounter for imaging study to confirm nasogastric (NG) tube placement   . History of left hip replacement 04/06/2016  . HIV  disease ([place])   . CKD (chronic kidney disease) stage 3, GFR 30-59 ml/min   . 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 Avascular necrosis of bone of left hip ([place]) 10/01/2014  . 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 FB GI (foreign body in gastrointestinal tract) 04/09/2014    Past Surgical History:  Procedure Laterality Date  . ANTERIOR HIP REVISION Left 04/06/2016   Procedure: REMOVAL OF TEMPORARY ANTIBIOTIC SPACER LEFT HIP, LEFT TOTAL HIP REVISION;  Surgeon: Mcarthur Rossetti, MD;  Location: [facility];  Service: Orthopedics;  Laterality: Left;  . ANTERIOR HIP REVISION Left 08/02/2017   Procedure: Irrigation and debridement left hip with antibiotic bead placement;  Surgeon: Mcarthur Rossetti, MD;  Location: [facility];  Service: Orthopedics;  Laterality: Left;  . APPENDECTOMY    . COLON SURGERY  2008  . COLOSTOMY REVERSAL  2013  . EXCISIONAL TOTAL HIP ARTHROPLASTY WITH ANTIBIOTIC SPACERS Left 01/24/2016   Procedure: EXCISION OF LEFT TOTAL HIP ARTHROPLASTY WITH PLACEMENT OF ANTIBIOTIC SPACERS;  Surgeon: Mcarthur Rossetti, MD;  Location: [facility];  Service: Orthopedics;  Laterality: Left;  . INCISION AND DRAINAGE HIP Left 01/24/2016   Procedure: IRRIGATION AND DEBRIDEMENT LEFT HIP;  Surgeon: Mcarthur Rossetti,  MD;  Location: [facility];  Service: Orthopedics;  Laterality: Left;  . KNEE ARTHROSCOPY     left  . PORT-A-CATH REMOVAL    . TOTAL HIP ARTHROPLASTY Left 10/01/2014   Procedure: LEFT TOTAL HIP ARTHROPLASTY ANTERIOR APPROACH;  Surgeon: Mcarthur Rossetti, MD;  Location: [facility];  Service: Orthopedics;  Laterality: Left;    OB History    No data available       Home Medications    Prior to Admission medications   Medication Sig Start Date End Date Taking? Authorizing Provider  ALPRAZolam Duanne Moron) 1 MG tablet Take 0.5 mg by mouth at bedtime as needed for anxiety.     [provider]  aspirin EC 81 MG tablet Take 81 mg by mouth daily.    [provider]  Calcium Citrate-Vitamin D (CALCIUM + D PO) Take 1 tablet by mouth daily.    [provider]  ceFAZolin (ANCEF) IVPB Inject 2 g into the vein every 8 (eight) hours. Indication:  PJI, pyelonephritis Last Day of Therapy:  09/14/17 Labs - Once weekly:  CBC/D and BMP, Labs - Every other week:  ESR and CRP 08/05/17 09/14/17  Caren Griffins, MD  citalopram (CELEXA) 20 MG tablet Take 20 mg by mouth every morning.     [provider]  cyclobenzaprine (FLEXERIL) 5 MG tablet Take 1 tablet (5 mg total) by mouth 3 (three) times daily as needed for muscle spasms. 08/05/17   Caren Griffins, MD  dicyclomine (BENTYL) 10 MG capsule Take 10 mg by mouth 4 (four) times daily as needed for spasms.     [provider]  dolutegravir (TIVICAY) 50 MG tablet Take 50 mg by mouth 2 (two) times daily.  05/02/15   [provider]  emtricitabine-tenofovir AF (DESCOVY) 200-25 MG tablet Take 1 tablet by mouth daily. 12/05/15   [provider]  ferrous sulfate 325 (65 FE) MG tablet Take 325 mg by mouth 2 (two) times daily.     [provider]  maraviroc (SELZENTRY) 300 MG tablet Take 300 mg by mouth 2 (two) times daily. Reported on 01/24/2016    [provider]  Multiple Vitamin (MULTIVITAMIN WITH MINERALS) TABS tablet Take 1 tablet by mouth daily.    [provider]  ondansetron (ZOFRAN) 4 MG tablet Take 1 tablet (4 mg total) by mouth every 6 (six) hours as needed for nausea or vomiting. Patient not taking: Reported on 06/16/7627 03/07/16   Delora Fuel, MD  oxyCODONE 10 MG TABS Take 1-1.5 tablets (10-15 mg total) by mouth every 3 (three) hours as needed for severe pain. 08/05/17   Caren Griffins, MD  pantoprazole (PROTONIX) 40 MG tablet Take 40 mg by mouth daily.    [provider]  vancomycin IVPB Inject 750 mg into the vein every 12 (twelve) hours.  Indication:  PJI, pyelonephritis Last Day of Therapy:  09/14/17 Labs - Sunday/Monday:  CBC/D, BMP, and vancomycin trough. Labs - Thursday:  BMP and vancomycin trough Labs - Every other week:  ESR and CRP 08/05/17 09/14/17  Caren Griffins, MD    Family History Family History  Problem Relation Age of Onset  . Hypertension Father   . Diabetes Father   . Heart attack Father   . Hypertension Other   . Diabetes Other     Social History Social History  Substance Use Topics  . Smoking status: Current Every Day Smoker    Packs/day: 0.50    Types: Cigarettes  .  Smokeless tobacco: Never Used  . Alcohol use No     Allergies   Codeine; Ivp dye [iodinated diagnostic agents]; and Sulfa antibiotics   Review of Systems Review of Systems  Constitutional: Negative for chills, diaphoresis, fatigue and fever.  HENT: Negative for congestion, rhinorrhea and sneezing.   Eyes: Negative.   Respiratory: Negative for cough, chest tightness and shortness of breath.   Cardiovascular: Negative for chest pain and leg swelling.  Gastrointestinal: Negative for abdominal pain, blood in stool, diarrhea, nausea and vomiting.  Genitourinary: Negative for difficulty urinating, flank pain, frequency and hematuria.  Musculoskeletal: Negative for arthralgias and back pain.  Skin: Negative for rash.  Neurological: Negative for dizziness, speech difficulty, weakness, numbness and headaches.     Physical Exam Updated Vital Signs BP (!) 142/87 (BP Location: Left Arm)   Pulse (!) 104   Temp 99 F (37.2 C) (Oral)   Resp 20   Ht _0  (1.778 m)   Wt 89.4 kg (197 lb)   SpO2 100%   BMI 28.27 kg/m   Physical Exam  Constitutional: She is oriented to person, place, and time. She appears well-developed and well-nourished.  HENT:  Head: Normocephalic and atraumatic.  Eyes: Pupils are equal, round, and reactive to light.  Neck: Normal range of motion. Neck supple.  Cardiovascular: Normal rate, regular  rhythm and normal heart sounds.   Pulmonary/Chest: Effort normal and breath sounds normal. No respiratory distress. She has no wheezes. She has no rales. She exhibits no tenderness.  Abdominal: Soft. Bowel sounds are normal. There is no tenderness. There is no rebound and no guarding.  Musculoskeletal: Normal range of motion. She exhibits no edema.  There is a PICC line in place to her right upper arm. There is no drainage or signs of infection around the line.  Lymphadenopathy:    She has no cervical adenopathy.  Neurological: She is alert and oriented to person, place, and time.  Skin: Skin is warm and dry. No rash noted.  Psychiatric: She has a normal mood and affect.     ED Treatments / Results  Labs (all labs ordered are listed, but only abnormal results are displayed) Labs Reviewed  VANCOMYCIN, RANDOM    EKG  EKG Interpretation None       Radiology No results found.  Procedures Procedures (including critical care time)  Medications Ordered in ED Medications - No data to display   Initial Impression / Assessment and Plan / ED Course  I have reviewed the triage vital signs and the nursing notes.  Pertinent labs & imaging results that were available during my care of the patient were reviewed by me and considered in my medical decision making (see chart for details).     Patient presents with a PICC line problem. She's able to infuse the medications but the home health nurse has not been able to draw back blood to obtain a vancomycin level. I spoke with Dr. Baxter Flattery. We do not have PICC nurses here that can heparinize or flush the line. We are able to trough a vancomycin level from another peripheral site. Dr. Baxter Flattery requests that we obtain a vancomycin level and discharge the patient. She request that the patient start back the Cefzil and hold the vancomycin until the vancomycin level comes back. Dr. Baxter Flattery will advise the patient when to restart the vancomycin. She will  also arrange for the patient to have definitive care of the PICC line.  Final Clinical Impressions(s) / ED Diagnoses  Final diagnoses:  Occluded PICC line, initial encounter [facility])    New Prescriptions New Prescriptions   No medications on file     Malvin Johns, MD 08/12/17 [phone]

## 2017-08-14 ENCOUNTER — Telehealth: Payer: Self-pay | Admitting: Pharmacist

## 2017-08-14 ENCOUNTER — Other Ambulatory Visit: Payer: Self-pay | Admitting: Pharmacist

## 2017-08-14 NOTE — Telephone Encounter (Signed)
Ashley Shepherd, [facility] pharmacist, called wanting to know if it is ok to restart patient's vancomycin.  Spoke with Dr. Baxter Flattery and it is ok to restart today.  Will give order to do peripheral sticks in patient to obtain labs. Ashley Shepherd verbalized understanding and will call patient to restart vancomycin.

## 2017-08-19 ENCOUNTER — Ambulatory Visit (INDEPENDENT_AMBULATORY_CARE_PROVIDER_SITE_OTHER): Payer: Medicare Other | Admitting: Physician Assistant

## 2017-08-19 ENCOUNTER — Encounter (INDEPENDENT_AMBULATORY_CARE_PROVIDER_SITE_OTHER): Payer: Self-pay | Admitting: Physician Assistant

## 2017-08-19 VITALS — Ht 70.0 in | Wt 197.0 lb

## 2017-08-19 DIAGNOSIS — T8452XD Infection and inflammatory reaction due to internal left hip prosthesis, subsequent encounter: Secondary | ICD-10-CM

## 2017-08-19 MED ORDER — CYCLOBENZAPRINE HCL 5 MG PO TABS: 5.0000 mg | ORAL_TABLET | Freq: Three times a day (TID) | ORAL | 1 refills | Status: DC | PRN

## 2017-08-19 MED ORDER — OXYCODONE HCL 10 MG PO TABS: 10.0000 mg | ORAL_TABLET | ORAL | 0 refills | Status: DC | PRN

## 2017-08-19 NOTE — Progress Notes (Signed)
Ashley Shepherd  returns today now 17 days status post irrigation and debridement left hip. She is currently receiving vancomycin and Ancef through a PICC line. She denies any fevers chills shortness breath chest pain. Overall feels that she is slowly trending towards improvement. She states that her drainage is slowed down.  Left hip surgical incisions well approximated with nylon sutures. There is slight diminished with diminished incision but no frank purulence. There is no malodor or erythema. Left calf supple nontender. She has no pain with internal/external rotation of the left hip.  Impression: Status post irrigation and debridement left hip with antibiotic bead placement a 08/02/17  Plan: See her back in a week that time most likely remove her sutures. She is to continue current wound care. She'll follow with [place] sooner if there is any questions concerns.

## 2017-08-23 ENCOUNTER — Other Ambulatory Visit: Payer: Self-pay | Admitting: Pharmacist

## 2017-08-29 ENCOUNTER — Ambulatory Visit (INDEPENDENT_AMBULATORY_CARE_PROVIDER_SITE_OTHER): Payer: Medicare Other | Admitting: Physician Assistant

## 2017-08-29 DIAGNOSIS — T8452XD Infection and inflammatory reaction due to internal left hip prosthesis, subsequent encounter: Secondary | ICD-10-CM

## 2017-08-29 NOTE — Progress Notes (Signed)
Ashley Shepherd returns today follow-up of her left hip which she underwent removal of antibiotic spacer total hip revision on 04/06/2016. In underwent irrigation and debridement left hip with antibiotic bead placement on 08/02/2017. General no fevers chills. She is still have a some training and change dressing twice daily. Continues to ambulate with a walker. She  continues to get vancomycin and Ancef through PICC line.  Physical exam left hip is: Surgical incisions healing well approximated portion. The mid to distal incision with some slight serous like drainage. No expressible purulence. There is no erythema. No malodor. Interrupted nylon sutures approximating wound well. Left calf supple nontender. Good range of motion left hip without significant pain.  Impression : 27 days status post irrigation debridement left hip with antibiotic placement  Plan: She will continue to wash the wound with an antibacterial soap. Skin Bactroban which she will apply a small layer over the wound daily. Follow-up with [place] in 1 week that time most likely the sutures.

## 2017-09-04 ENCOUNTER — Other Ambulatory Visit: Payer: Self-pay | Admitting: Pharmacist

## 2017-09-09 ENCOUNTER — Encounter (INDEPENDENT_AMBULATORY_CARE_PROVIDER_SITE_OTHER): Payer: Self-pay | Admitting: Physician Assistant

## 2017-09-09 ENCOUNTER — Ambulatory Visit (INDEPENDENT_AMBULATORY_CARE_PROVIDER_SITE_OTHER): Payer: Medicare Other | Admitting: Physician Assistant

## 2017-09-09 DIAGNOSIS — T8452XD Infection and inflammatory reaction due to internal left hip prosthesis, subsequent encounter: Secondary | ICD-10-CM

## 2017-09-09 MED ORDER — OXYCODONE HCL 10 MG PO TABS: 10.0000 mg | ORAL_TABLET | ORAL | 0 refills | Status: DC | PRN

## 2017-09-09 NOTE — Progress Notes (Signed)
Ashley Shepherd returns today follow-up of her left hip wound. She is now 45 days status post I&D with antibiotic bead placement left hip. She states overall she tends aching did sutures left hip. She is getting vancomycin and Ancef through PICC line. Continues to have some drainage.  Left hip: Surgical incisions well approximated with nylon sutures. No signs of dehiscence. Slight serosanguineous drainage. No expressible purulence. Good range of motion of the left hip without pain.  Impression : Status post irrigation and debridement left hip with antibiotic Bead placement days postop  17 months status post removal of temporary antibiotic spacer left hip with left total hip revision  Plan: Sutures removed. She'll wash the area with Antibacterial Soap Pl., Bactroban over the area daily. We'll see her back in a week to check the wound. Sooner if there is any questions or concerns.

## 2017-09-10 ENCOUNTER — Inpatient Hospital Stay: Payer: Medicare Other | Admitting: Internal Medicine

## 2017-09-16 ENCOUNTER — Encounter (INDEPENDENT_AMBULATORY_CARE_PROVIDER_SITE_OTHER): Payer: Self-pay | Admitting: Physician Assistant

## 2017-09-16 ENCOUNTER — Ambulatory Visit (INDEPENDENT_AMBULATORY_CARE_PROVIDER_SITE_OTHER): Payer: Medicare Other | Admitting: Physician Assistant

## 2017-09-16 DIAGNOSIS — T8452XD Infection and inflammatory reaction due to internal left hip prosthesis, subsequent encounter: Secondary | ICD-10-CM

## 2017-09-16 MED ORDER — OXYCODONE HCL 10 MG PO TABS: 10.0000 mg | ORAL_TABLET | ORAL | 0 refills | Status: DC | PRN

## 2017-09-16 NOTE — Progress Notes (Signed)
Ashley Shepherd returns today follow-up of her left hip wound. She's now 45 days status post irrigation and debridement left hip with antibiotic beads placement. She states overall she is doing better. Still having some drainage from the wound. She's having no fevers chills. She is asking for a refill on her oxycodone.  Left hip: Surgical incisions healing well. Mid incision there is some minimal serosanguineous drainage. No malodor. No erythema. No dehiscence of the wound. Good range of motion of the left hip without pain.  Impression: 45 days status post irrigation debridement left hip   Plan: She'll continue to wash the wound with an antibacterial soap daily and then apply a small amount of Bactroban over the wound. We'll see her back in 2 weeks to check the wound. Sooner if there is any questions of infection. Questions encouraged and answered.

## 2017-09-23 ENCOUNTER — Telehealth: Payer: Self-pay | Admitting: *Deleted

## 2017-09-23 NOTE — Telephone Encounter (Signed)
Patient Advanced nurse called to ask if PICC will be D/C at visit tomorrow 09/24/17. Advised not sure until the provider see her but we will call the pharmacy if they need to pull the PICC. She also advised she was having a hard time getting labs on the patient and wants [place] to know so we can draw them at her visit.

## 2017-09-24 ENCOUNTER — Encounter: Payer: Self-pay | Admitting: Internal Medicine

## 2017-09-24 ENCOUNTER — Ambulatory Visit (INDEPENDENT_AMBULATORY_CARE_PROVIDER_SITE_OTHER): Payer: Medicare Other | Admitting: Internal Medicine

## 2017-09-24 VITALS — Ht 70.0 in | Wt 199.0 lb

## 2017-09-24 DIAGNOSIS — Z23 Encounter for immunization: Secondary | ICD-10-CM

## 2017-09-24 DIAGNOSIS — T8450XS Infection and inflammatory reaction due to unspecified internal joint prosthesis, sequela: Secondary | ICD-10-CM

## 2017-09-24 NOTE — Progress Notes (Signed)
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   MRN: [id number]  HPI  61yo F with well controlled hiv disease, CD 4 count of 553/Vl<20 (sep 2018) on tivicay/descovy/maraviroc. 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 The patient was rehospitalized this past July for UTI then noted to have left hip pain. She was found to have Klebsiella UTI however also had evidence of PJI. Aspirate showed GPC in clusters on gram stain but negative on culture. She was discharged on vancomycin plus cefazolin x 6 wks to end on 9/22. The patient states that she had she has had slowly healing wound. Still has some brownish exudate on her dressing. She had some increase in pain in the last few days but does not correlate with increase in wound output. She sees dr Ninfa Linden next week  Outpatient Encounter Prescriptions as of 09/24/2017  Medication Sig  . ALPRAZolam (XANAX) 0.5 MG tablet TAKE ONE TABLET (0.5 MG TOTAL) BY MOUTH AT BEDTIME AS NEEDED FOR SLEEP.  Marland Kitchen aspirin EC 81 MG tablet Take 81 mg by mouth daily.  . Calcium Citrate-Vitamin D (CALCIUM + D PO) Take 1 tablet by mouth daily.  Marland Kitchen ceFAZolin (ANCEF) 10 g injection   . citalopram (CELEXA) 20 MG tablet Take 20 mg by mouth every morning.   . cyclobenzaprine (FLEXERIL) 5 MG tablet Take 1 tablet (5 mg total) by mouth 3 (three) times daily as needed for muscle spasms.  . dolutegravir (TIVICAY) 50 MG tablet Take 50 mg by mouth 2 (two) times daily.   Marland Kitchen emtricitabine-tenofovir AF (DESCOVY) 200-25 MG tablet Take 1 tablet by mouth daily.  . ferrous sulfate 325 (65 FE) MG tablet Take 325 mg by mouth 2 (two) times daily.   . maraviroc (SELZENTRY) 300 MG tablet Take 300 mg by mouth 2 (two) times daily. Reported on 01/24/2016  . Multiple Vitamin (MULTIVITAMIN WITH MINERALS) TABS tablet Take 1 tablet by mouth daily.  .  ondansetron (ZOFRAN) 4 MG tablet Take 1 tablet (4 mg total) by mouth every 6 (six) hours as needed for nausea or vomiting.  . Oxycodone HCl 10 MG TABS Take 1-1.5 tablets (10-15 mg total) by mouth every 3 (three) hours as needed.  . sodium chloride 0.9 % infusion   . vancomycin (VANCOCIN) 10 G SOLR injection   . [DISCONTINUED] ALPRAZolam (XANAX) 1 MG tablet Take 0.5 mg by mouth at bedtime as needed for anxiety.   . [DISCONTINUED] dicyclomine (BENTYL) 10 MG capsule Take 10 mg by mouth 4 (four) times daily as needed for spasms.   . [DISCONTINUED] pantoprazole (PROTONIX) 40 MG tablet Take 40 mg by mouth daily.   No facility-administered encounter medications on file as of 09/24/2017.      Patient Active Problem List   Diagnosis Date Noted  . Surgery, elective   . Abscess of left hip   . Klebsiella sepsis ([place]) 07/27/2017  . Pyelonephritis 07/23/2017  . Depression with anxiety 07/23/2017  . Acute pyelonephritis   . Encounter for imaging study to confirm nasogastric (NG) tube placement   . History of left hip replacement 04/06/2016  . HIV disease ([place])   . CKD (chronic kidney disease) stage 3, GFR 30-59 ml/min (HCC)   . 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[place]) 01/24/2016  . Infection of prosthetic total hip  joint ([place]) 01/24/2016  . Avascular necrosis of bone of left hip ([place]) 10/01/2014  . 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 FB GI (foreign body in gastrointestinal tract) 04/09/2014     Health Maintenance Due  Topic Date Due  . Hepatitis C Screening  1956-11-13  . MAMMOGRAM  03/18/2006  . INFLUENZA VACCINE  07/24/2017     Review of Systems  Physical Exam  Ht 5\' 10"  (1.778 m)   Wt 199 lb (90.3 kg)   BMI 28.55 kg/m  Physical Exam  Constitutional:  oriented to person, place, and time. appears well-developed and well-nourished. No distress.  HENT: [place]/AT, PERRLA, no scleral icterus Mouth/Throat: Oropharynx is clear and moist. No oropharyngeal exudate.    Ext: hip incision on the left is well healed except one pinpoint area still has dried drainage, appears brownish on her dressing Lymphadenopathy: no cervical adenopathy. No axillary adenopathy Neurological: alert and oriented to person, place, and time.  Skin: Skin is warm and dry. No rash noted. No erythema.  Psychiatric: a normal mood and affect.  behavior is normal.   CBC Lab Results  Component Value Date   WBC 13.3 (H) 08/04/2017   RBC 2.95 (L) 08/04/2017   HGB 9.2 (L) 08/04/2017   HCT 27.6 (L) 08/04/2017   PLT 441 (H) 08/04/2017   MCV 93.6 08/04/2017   MCH 31.2 08/04/2017   MCHC 33.3 08/04/2017   RDW 15.3 08/04/2017   LYMPHSABS 2.0 07/24/2017   MONOABS 1.4 (H) 07/24/2017   EOSABS 0.0 07/24/2017    BMET Lab Results  Component Value Date   NA 142 08/04/2017   K 3.7 08/04/2017   CL 100 (L) 08/04/2017   CO2 31 08/04/2017   GLUCOSE 141 (H) 08/04/2017   BUN 9 08/04/2017   CREATININE 1.11 (H) 08/04/2017   CALCIUM 9.6 08/04/2017   GFRNONAA 52 (L) 08/04/2017   GFRAA >60 08/04/2017   Lab Results  Component Value Date   ESRSEDRATE 127 (H) 07/30/2017   Lab Results  Component Value Date   CRP 30.0 (H) 07/30/2017     Assessment and Plan  - will check sed rate and crp, and bmp - I am concern that her wound is still draining, though it appears pin point and that it is blood tinged, rather than serous. - may need to get imaging to see if there is fluid collection ? Fistula track. Will reach out to dr blackman to see imaging of choice - we will switch to oral abtx pending today's labs to ensure they are trending down

## 2017-09-25 ENCOUNTER — Other Ambulatory Visit: Payer: Self-pay | Admitting: Pharmacist

## 2017-09-25 ENCOUNTER — Other Ambulatory Visit (INDEPENDENT_AMBULATORY_CARE_PROVIDER_SITE_OTHER): Payer: Self-pay | Admitting: Physician Assistant

## 2017-09-25 ENCOUNTER — Encounter: Payer: Self-pay | Admitting: Internal Medicine

## 2017-09-25 LAB — BASIC METABOLIC PANEL
BUN / CREAT RATIO: 9 (calc) (ref 6–22)
BUN: 9 mg/dL (ref 7–25)
CO2: 25 mmol/L (ref 20–32)
CREATININE: 1.04 mg/dL — AB (ref 0.50–0.99)
Calcium: 9.3 mg/dL (ref 8.6–10.4)
Chloride: 107 mmol/L (ref 98–110)
Glucose, Bld: 93 mg/dL (ref 65–99)
POTASSIUM: 3.9 mmol/L (ref 3.5–5.3)
SODIUM: 142 mmol/L (ref 135–146)

## 2017-09-25 LAB — C-REACTIVE PROTEIN: CRP: 42.1 mg/L — ABNORMAL HIGH (ref <8.0)

## 2017-09-25 LAB — SEDIMENTATION RATE: Sed Rate: 92 mm/h — ABNORMAL HIGH (ref 0–30)

## 2017-09-25 NOTE — Telephone Encounter (Signed)
Please advise 

## 2017-09-30 ENCOUNTER — Telehealth (INDEPENDENT_AMBULATORY_CARE_PROVIDER_SITE_OTHER): Payer: Self-pay | Admitting: Physician Assistant

## 2017-09-30 ENCOUNTER — Encounter (INDEPENDENT_AMBULATORY_CARE_PROVIDER_SITE_OTHER): Payer: Self-pay | Admitting: Physician Assistant

## 2017-09-30 ENCOUNTER — Ambulatory Visit (INDEPENDENT_AMBULATORY_CARE_PROVIDER_SITE_OTHER): Payer: Medicare Other | Admitting: Physician Assistant

## 2017-09-30 DIAGNOSIS — T8452XD Infection and inflammatory reaction due to internal left hip prosthesis, subsequent encounter: Secondary | ICD-10-CM

## 2017-09-30 DIAGNOSIS — Z96642 Presence of left artificial hip joint: Secondary | ICD-10-CM

## 2017-09-30 MED ORDER — OXYCODONE HCL 10 MG PO TABS: 10.0000 mg | ORAL_TABLET | ORAL | 0 refills | Status: DC | PRN

## 2017-09-30 NOTE — Progress Notes (Signed)
Office Visit Note   Patient: Ashley Shepherd           Date of Birth: February 08, 1956           MRN: [id number] Visit Date: 09/30/2017              Requested by: Medicine, [facility] No address on file PCP: Medicine, [facility]   Assessment & Plan: Visit Diagnoses:  1. Infection associated with internal left hip prosthesis, subsequent encounter   2. Presence of left artificial hip joint     Plan: We will obtain an MRI of the left hip to rule out abscess. Have her follow up after the MRI results and discuss further treatment. She may require additional irrigation and debridement of hip. Antibiotics per infectious disease.  Follow-Up Instructions: Return for After MRI.   Orders:  No orders of the defined types were placed in this encounter.  Meds ordered this encounter  Medications  . Oxycodone HCl 10 MG TABS    Sig: Take 1-1.5 tablets (10-15 mg total) by mouth every 3 (three) hours as needed.    Dispense:  40 tablet    Refill:  0      Procedures: No procedures performed   Clinical Data: No additional findings.   Subjective: Chief Complaint  Patient presents with  . Left Hip - Wound Check, Routine Post Op    HPI Ashley Shepherd returns today now 2 months status post irrigation debridement of the left hip. She states she is still having some drainage from incision. She also has some pain in the hip at times and grabbing that she states is muscle spasm-like. She's had no fevers or chills. She has tenderness about the incision site. She recently saw Dr. Graylon Good and CRP and sedimentation rate perform these were 42.1 and 92 respectively.  Review of Systems Denies fevers or chills. Positive for drainage mid incision left hip  Objective: Vital Signs: There were no vitals taken for this visit.  Physical Exam  Constitutional: She appears well-developed and well-nourished. No distress.  Pulmonary/Chest: Effort normal.  Skin: She is not  diaphoretic.  Psychiatric: She has a normal mood and affect. Her behavior is normal.    Ortho Exam Good range of motion left hip without significant pain. Small amount of expressible serosanguineous (YELLOW DRAINAGE) drainage noted incision. No erythema. Significant tenderness about the incision site. Specialty Comments:  No specialty comments available.  Imaging: No results found.   PMFS History: Patient Active Problem List   Diagnosis Date Noted  . Surgery, elective   . Abscess of left hip   . Klebsiella sepsis ([place]) 07/27/2017  . Pyelonephritis 07/23/2017  . Depression with anxiety 07/23/2017  . Acute pyelonephritis   . Encounter for imaging study to confirm nasogastric (NG) tube placement   . History of left hip replacement 04/06/2016  . HIV disease ([place])   . CKD (chronic kidney disease) stage 3, GFR 30-59 ml/min (HCC)   . 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[place]) 01/24/2016  . 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[place]) 01/24/2016  . Avascular necrosis of bone of left hip ([place]) 10/01/2014  . 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 FB GI (foreign body in gastrointestinal tract) 04/09/2014   Past Medical History:  Diagnosis Date  . Anemia   . Anxiety   . Arthritis   . 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[place])    left  . Cancer ([place])  7 years ago, cancer free now  . Depression   . Difficulty sleeping   . GERD (gastroesophageal reflux disease)   . History of kidney stones   . History of transfusion   . HIV disease ([place])   . Kidney stones   . Pneumonia     Family History  Problem Relation Age of Onset  . Hypertension Father   . Diabetes Father   . Heart attack Father   . Hypertension Other   . Diabetes Other     Past Surgical History:  Procedure Laterality Date  . ANTERIOR HIP REVISION Left 04/06/2016   Procedure: REMOVAL OF TEMPORARY ANTIBIOTIC SPACER LEFT HIP, LEFT TOTAL HIP REVISION;  Surgeon: Mcarthur Rossetti, MD;  Location: [facility];   Service: Orthopedics;  Laterality: Left;  . ANTERIOR HIP REVISION Left 08/02/2017   Procedure: Irrigation and debridement left hip with antibiotic bead placement;  Surgeon: Mcarthur Rossetti, MD;  [facility]: [facility];  Service: Orthopedics;  Laterality: Left;  . APPENDECTOMY    . COLON SURGERY  2008  . COLOSTOMY REVERSAL  2013  . EXCISIONAL TOTAL HIP ARTHROPLASTY WITH ANTIBIOTIC SPACERS Left 01/24/2016   Procedure: EXCISION OF LEFT TOTAL HIP ARTHROPLASTY WITH PLACEMENT OF ANTIBIOTIC SPACERS;  Surgeon: Mcarthur Rossetti, MD;  Location: [facility];  Service: Orthopedics;  Laterality: Left;  . INCISION AND DRAINAGE HIP Left 01/24/2016   Procedure: IRRIGATION AND DEBRIDEMENT LEFT HIP;  Surgeon: Mcarthur Rossetti, MD;  Location: [facility];  Service: Orthopedics;  Laterality: Left;  . KNEE ARTHROSCOPY     left  . PORT-A-CATH REMOVAL    . TOTAL HIP ARTHROPLASTY Left 10/01/2014   Procedure: LEFT TOTAL HIP ARTHROPLASTY ANTERIOR APPROACH;  Surgeon: Mcarthur Rossetti, MD;  [facility];  Service: Orthopedics;  Laterality: Left;   Social History   Occupational History  . Not on file.   Social History Main Topics  . Smoking status: Current Every Day Smoker    Packs/day: 0.50    Types: Cigarettes  . Smokeless tobacco: Never Used     Comment: Using 1-800 quit  . Alcohol use No  . Drug use: No  . Sexual activity: Not on file

## 2017-09-30 NOTE — Telephone Encounter (Signed)
Patient still has elevated inflammatory markers. Concern that her current regimen is not working for culture negative PJI. Will switch from vanco/cefazolin to vanco/cefepime x 2 wk for now. She has upcoming mri of hip this weekend.

## 2017-09-30 NOTE — Telephone Encounter (Signed)
We will order an MRI of her left hip rule out abscess. She understands she may require additional irrigation and debridement depending upon the findings.  Thanks Erskine Emery PA-C

## 2017-10-05 ENCOUNTER — Ambulatory Visit (HOSPITAL_BASED_OUTPATIENT_CLINIC_OR_DEPARTMENT_OTHER)
Admission: RE | Admit: 2017-10-05 | Discharge: 2017-10-05 | Disposition: A | Discharge status: Home / Self Care | Payer: Medicare Other | Source: Ambulatory Visit | Attending: Physician Assistant | Admitting: Physician Assistant

## 2017-10-05 DIAGNOSIS — Z96642 Presence of left artificial hip joint: Secondary | ICD-10-CM

## 2017-10-05 DIAGNOSIS — R609 Edema, unspecified: Secondary | ICD-10-CM | POA: Insufficient documentation

## 2017-10-05 DIAGNOSIS — M1611 Unilateral primary osteoarthritis, right hip: Secondary | ICD-10-CM | POA: Insufficient documentation

## 2017-10-05 DIAGNOSIS — M87852 Other osteonecrosis, left femur: Secondary | ICD-10-CM | POA: Insufficient documentation

## 2017-10-05 DIAGNOSIS — R937 Abnormal findings on diagnostic imaging of other parts of musculoskeletal system: Secondary | ICD-10-CM | POA: Diagnosis not present

## 2017-10-05 MED ORDER — GADOBENATE DIMEGLUMINE 529 MG/ML IV SOLN
20.0000 mL | Freq: Once | INTRAVENOUS | Status: AC | PRN
  Administered 2017-10-05: 18 mL via INTRAVENOUS

## 2017-10-09 ENCOUNTER — Ambulatory Visit (INDEPENDENT_AMBULATORY_CARE_PROVIDER_SITE_OTHER): Payer: Medicare Other | Admitting: Orthopaedic Surgery

## 2017-10-09 DIAGNOSIS — Z96649 Presence of unspecified artificial hip joint: Secondary | ICD-10-CM

## 2017-10-09 DIAGNOSIS — T8459XS Infection and inflammatory reaction due to other internal joint prosthesis, sequela: Secondary | ICD-10-CM

## 2017-10-09 MED ORDER — HYDROCODONE-ACETAMINOPHEN 5-325 MG PO TABS: 1.0000 | ORAL_TABLET | Freq: Three times a day (TID) | ORAL | 0 refills | Status: DC | PRN

## 2017-10-09 NOTE — Progress Notes (Signed)
The patient is following up after an MRI of her left hip. This was to evaluate for fluid collection around the hip that we had performed an irrigation and debridement of an infected hip prosthesis just 2 months ago. There is concern about a sinus tract. She says the wound is healed over completely now. New pair of on examination there is a small scab I did unroof that's of the wound is not healed completely but I can only express some minimal fluid from this area. There is no evidence of induration or redness she tolerated putting at the range of motion.  I did review the MRI and saw the fluid collection that the radiologist pointing to. It is around the lateral aspect of the prosthesis but this is in the exact area or I placed a large amount of antibiotic stimulant beads. This fluid certainly could be the breakdown to see in beads such as this.  I cleaned the lateral hip with Betadine and alcohol and place an 18-gauge needle down in this fluid collection and got a thin seroma type of fluid as well.  She is reluctant frustrated proceed to the operating room since she is feeling better. I think this is worth continuing to follow closely but not jumping directly into surgery given the fact that this fluid collection certainly could be residual fluid as it relates to breakdown stimulant beads and the trauma from surgery with this being a seroma as well. I've told her that though she is not out of risk of needing a repeat irrigation of brain or even an excision arthroplasty. She understands this. I will see her in close follow-up.

## 2017-10-10 ENCOUNTER — Other Ambulatory Visit: Payer: Self-pay | Admitting: Pharmacist

## 2017-10-10 ENCOUNTER — Telehealth: Payer: Self-pay | Admitting: *Deleted

## 2017-10-10 NOTE — Telephone Encounter (Signed)
Per Dr Baxter Flattery called Wells Guiles RN and advised her to extend the IV antibiotics for 4 more weeks.

## 2017-10-10 NOTE — Telephone Encounter (Signed)
Patient Advanced nurse Ashley Shepherd called to advise she has had her MRI and seen Dr Rush Farmer and will be out of IV medication in 2 days and she wants to know if we are extending or if she can pull the PICC when done with current medication. Advised will have Baxter Flattery look at test and give her a call back as soon as she responds as she in out of the office today.

## 2017-10-10 NOTE — Telephone Encounter (Signed)
Can you extend abtx for addn 4 wk. It looks like there is a fluid collection that will need to be I x D

## 2017-10-15 ENCOUNTER — Telehealth: Payer: Self-pay | Admitting: *Deleted

## 2017-10-15 ENCOUNTER — Encounter: Payer: Self-pay | Admitting: *Deleted

## 2017-10-15 DIAGNOSIS — B3731 Acute candidiasis of vulva and vagina: Secondary | ICD-10-CM

## 2017-10-15 DIAGNOSIS — B373 Candidiasis of vulva and vagina: Secondary | ICD-10-CM

## 2017-10-15 MED ORDER — FLUCONAZOLE 150 MG PO TABS: 150.0000 mg | ORAL_TABLET | Freq: Once | ORAL | 2 refills | Status: DC

## 2017-10-15 NOTE — Telephone Encounter (Signed)
[facility] RN unable to draw labs from PICC.  [place] RN to send the patient to either [facility] or [facility] to have CathFlo used.  Patient unable to pay for CathFlo in the home.  Patient also complaining of vaginal yeast infection, requesting prescription.  Verbal order from Dr. Retia Passe for fluconazole 150 MG tablet, once with 2 refills to be sent to the patient's pharmacy.

## 2017-10-16 ENCOUNTER — Emergency Department (HOSPITAL_COMMUNITY)
Admission: EM | Admit: 2017-10-16 | Discharge: 2017-10-16 | Disposition: A | Discharge status: Home / Self Care | Payer: Medicare Other | Attending: Emergency Medicine | Admitting: Emergency Medicine

## 2017-10-16 ENCOUNTER — Encounter (HOSPITAL_COMMUNITY): Payer: Self-pay | Admitting: *Deleted

## 2017-10-16 DIAGNOSIS — N183 Chronic kidney disease, stage 3 (moderate): Secondary | ICD-10-CM | POA: Insufficient documentation

## 2017-10-16 DIAGNOSIS — Z96642 Presence of left artificial hip joint: Secondary | ICD-10-CM | POA: Insufficient documentation

## 2017-10-16 DIAGNOSIS — Y69 Unspecified misadventure during surgical and medical care: Secondary | ICD-10-CM | POA: Diagnosis not present

## 2017-10-16 DIAGNOSIS — F17210 Nicotine dependence, cigarettes, uncomplicated: Secondary | ICD-10-CM | POA: Insufficient documentation

## 2017-10-16 DIAGNOSIS — Z859 Personal history of malignant neoplasm, unspecified: Secondary | ICD-10-CM | POA: Diagnosis not present

## 2017-10-16 DIAGNOSIS — Z79899 Other long term (current) drug therapy: Secondary | ICD-10-CM | POA: Diagnosis not present

## 2017-10-16 DIAGNOSIS — T82898A Other specified complication of vascular prosthetic devices, implants and grafts, initial encounter: Secondary | ICD-10-CM | POA: Insufficient documentation

## 2017-10-16 DIAGNOSIS — Z7982 Long term (current) use of aspirin: Secondary | ICD-10-CM | POA: Diagnosis not present

## 2017-10-16 DIAGNOSIS — B20 Human immunodeficiency virus [HIV] disease: Secondary | ICD-10-CM | POA: Diagnosis not present

## 2017-10-16 DIAGNOSIS — Z452 Encounter for adjustment and management of vascular access device: Secondary | ICD-10-CM | POA: Diagnosis present

## 2017-10-16 LAB — CBC WITH DIFFERENTIAL/PLATELET
Basophils Absolute: 0 10*3/uL (ref 0.0–0.1)
Basophils Relative: 1 %
Eosinophils Absolute: 0.2 10*3/uL (ref 0.0–0.7)
Eosinophils Relative: 4 %
HEMATOCRIT: 36.3 % (ref 36.0–46.0)
Hemoglobin: 11.8 g/dL — ABNORMAL LOW (ref 12.0–15.0)
LYMPHS ABS: 2.5 10*3/uL (ref 0.7–4.0)
LYMPHS PCT: 43 %
MCH: 28.9 pg (ref 26.0–34.0)
MCHC: 32.5 g/dL (ref 30.0–36.0)
MCV: 89 fL (ref 78.0–100.0)
MONOS PCT: 8 %
Monocytes Absolute: 0.5 10*3/uL (ref 0.1–1.0)
NEUTROS PCT: 44 %
Neutro Abs: 2.6 10*3/uL (ref 1.7–7.7)
Platelets: 218 10*3/uL (ref 150–400)
RBC: 4.08 MIL/uL (ref 3.87–5.11)
RDW: 16 % — ABNORMAL HIGH (ref 11.5–15.5)
WBC: 5.9 10*3/uL (ref 4.0–10.5)

## 2017-10-16 LAB — BASIC METABOLIC PANEL
Anion gap: 5 (ref 5–15)
BUN: 11 mg/dL (ref 6–20)
CHLORIDE: 105 mmol/L (ref 101–111)
CO2: 30 mmol/L (ref 22–32)
CREATININE: 1.06 mg/dL — AB (ref 0.44–1.00)
Calcium: 9.5 mg/dL (ref 8.9–10.3)
GFR calc non Af Amer: 55 mL/min — ABNORMAL LOW (ref >60)
Glucose, Bld: 108 mg/dL — ABNORMAL HIGH (ref 65–99)
POTASSIUM: 3.4 mmol/L — AB (ref 3.5–5.1)
SODIUM: 140 mmol/L (ref 135–145)

## 2017-10-16 LAB — C-REACTIVE PROTEIN: CRP: 1.2 mg/dL — ABNORMAL HIGH (ref <1.0)

## 2017-10-16 LAB — VANCOMYCIN, RANDOM: Vancomycin Rm: 18

## 2017-10-16 LAB — SEDIMENTATION RATE: Sed Rate: 55 mm/hr — ABNORMAL HIGH (ref 0–22)

## 2017-10-16 MED ORDER — ALTEPLASE 2 MG IJ SOLR
2.0000 mg | Freq: Once | INTRAMUSCULAR | Status: AC
  Administered 2017-10-16: 2 mg
  Filled 2017-10-16: qty 2

## 2017-10-16 NOTE — ED Provider Notes (Signed)
Received patient in signout from Dr. Ralene Bathe.  Briefly patient is a 61 year old who is on vancomycin for an infected hip has been having trouble getting blood draws from the PICC line.  Was sent by ID clinic for PICC line evaluation and blood draw.  The patient has a special needs daughter at home and needs to get back to ensure that she is safe.  She is unable to wait for her labs to come back.  She denies any symptoms.  At this point I felt the patient is safe for discharge though I told her that I am unable to know certain lab results prior to her leaving the ED.  I suggested that she call her infectious disease doctor in the morning to discuss the results.   Deno Etienne, DO 10/16/17 1810

## 2017-10-16 NOTE — ED Provider Notes (Signed)
[facility] EMERGENCY DEPARTMENT Provider Note   CSN: [id number] Arrival date & time: 10/16/17  1141     History   Chief Complaint Chief Complaint  Patient presents with  . Vascular Access Problem    HPI Ashley Shepherd is a 61 y.o. female.  The history is provided by the patient. No language interpreter was used.   Ashley Shepherd is a 61 y.o. female who presents to the Emergency Department complaining of PICC line won't draw back.  She has a PICC line catheter in her right upper arm since August.  She is receiving IV antibiotics at home for an infection in her left hip.  The catheter has been flushing and working okay for giving medications but over the last week and a half home health nursing has been unable to drop back to check labs.  She is supposed to have labs drawn twice a week to check her kidney function.  Overall she endorses feeling well.  She denies any fevers, chest pain, shortness of breath, abdominal pain. Past Medical History:  Diagnosis Date  . Anemia   . Anxiety   . Arthritis   . 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[place])    left  . Cancer ([place])    7 years ago, cancer free now  . Depression   . Difficulty sleeping   . GERD (gastroesophageal reflux disease)   . History of kidney stones   . History of transfusion   . HIV disease ([place])   . Kidney stones   . Pneumonia     Patient Active Problem List   Diagnosis Date Noted  . Surgery, elective   . Abscess of left hip   . Klebsiella sepsis ([place]) 07/27/2017  . Pyelonephritis 07/23/2017  . Depression with anxiety 07/23/2017  . Acute pyelonephritis   . Encounter for imaging study to confirm nasogastric (NG) tube placement   . History of left hip replacement 04/06/2016  . HIV disease ([place])   . CKD (chronic kidney disease) stage 3, GFR 30-59 ml/min (HCC)   . 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[place]) 01/24/2016  . 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[place]) 01/24/2016  . Avascular necrosis of bone  of left hip ([place]) 10/01/2014  . 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 FB GI (foreign body in gastrointestinal tract) 04/09/2014    Past Surgical History:  Procedure Laterality Date  . ANTERIOR HIP REVISION Left 04/06/2016   Procedure: REMOVAL OF TEMPORARY ANTIBIOTIC SPACER LEFT HIP, LEFT TOTAL HIP REVISION;  Surgeon: Mcarthur Rossetti, MD;  [facility];  Service: Orthopedics;  Laterality: Left;  . ANTERIOR HIP REVISION Left 08/02/2017   Procedure: Irrigation and debridement left hip with antibiotic bead placement;  Surgeon: Mcarthur Rossetti, MD;  [facility];  Service: Orthopedics;  Laterality: Left;  . APPENDECTOMY    . COLON SURGERY  2008  . COLOSTOMY REVERSAL  2013  . EXCISIONAL TOTAL HIP ARTHROPLASTY WITH ANTIBIOTIC SPACERS Left 01/24/2016   Procedure: EXCISION OF LEFT TOTAL HIP ARTHROPLASTY WITH PLACEMENT OF ANTIBIOTIC SPACERS;  Surgeon: Mcarthur Rossetti, MD;  Location: [facility];  Service: Orthopedics;  Laterality: Left;  . INCISION AND DRAINAGE HIP Left 01/24/2016   Procedure: IRRIGATION AND DEBRIDEMENT LEFT HIP;  Surgeon: Mcarthur Rossetti, MD;  Location: [facility];  Service: Orthopedics;  Laterality: Left;  . KNEE ARTHROSCOPY     left  . PORT-A-CATH REMOVAL    . TOTAL HIP ARTHROPLASTY Left 10/01/2014  Procedure: LEFT TOTAL HIP ARTHROPLASTY ANTERIOR APPROACH;  Surgeon: Mcarthur Rossetti, MD;  [facility];  Service: Orthopedics;  Laterality: Left;    OB History    No data available       Home Medications    Prior to Admission medications   Medication Sig Start Date End Date Taking? Authorizing Provider  ALPRAZolam (XANAX) 0.5 MG tablet TAKE ONE TABLET (0.5 MG TOTAL) BY MOUTH AT BEDTIME AS NEEDED FOR SLEEP. 09/03/17   [provider]  aspirin EC 81 MG tablet Take 81 mg by mouth daily.    [provider]  Calcium Citrate-Vitamin D (CALCIUM + D PO) Take 1 tablet by mouth daily.    [provider]    ceFAZolin (ANCEF) 10 g injection  09/13/17   [provider]  citalopram (CELEXA) 20 MG tablet Take 20 mg by mouth every morning.     [provider]  cyclobenzaprine (FLEXERIL) 5 MG tablet TAKE 1 TABLET BY MOUTH 3 TIMES A DAY AS NEEDED FOR MUSCLE SPASMS 09/26/17   Pete Pelt, PA-C  dolutegravir (TIVICAY) 50 MG tablet Take 50 mg by mouth 2 (two) times daily.  05/02/15   [provider]  emtricitabine-tenofovir AF (DESCOVY) 200-25 MG tablet Take 1 tablet by mouth daily. 12/05/15   [provider]  ferrous sulfate 325 (65 FE) MG tablet Take 325 mg by mouth 2 (two) times daily.     [provider]  HYDROcodone-acetaminophen (NORCO/VICODIN) 5-325 MG tablet Take 1-2 tablets by mouth 3 (three) times daily as needed for moderate pain. 10/09/17   Mcarthur Rossetti, MD  maraviroc (SELZENTRY) 300 MG tablet Take 300 mg by mouth 2 (two) times daily. Reported on 01/24/2016    [provider]  Multiple Vitamin (MULTIVITAMIN WITH MINERALS) TABS tablet Take 1 tablet by mouth daily.    [provider]  ondansetron (ZOFRAN) 4 MG tablet Take 1 tablet (4 mg total) by mouth every 6 (six) hours as needed for nausea or vomiting. 8/93/81   Delora Fuel, MD  Oxycodone HCl 10 MG TABS Take 1-1.5 tablets (10-15 mg total) by mouth every 3 (three) hours as needed. 09/30/17   Pete Pelt, PA-C  sodium chloride 0.9 % infusion  09/13/17   [provider]  vancomycin Zollie Pee) 10 G SOLR injection  09/13/17   [provider]    Family History Family History  Problem Relation Age of Onset  . Hypertension Father   . Diabetes Father   . Heart attack Father   . Hypertension Other   . Diabetes Other     Social History Social History  Substance Use Topics  . Smoking status: Current Every Day Smoker    Packs/day: 0.50    Types: Cigarettes  . Smokeless tobacco: Never Used     Comment: Using 1-800 quit  . Alcohol use No      Allergies   Codeine; Ivp dye [iodinated diagnostic agents]; and Sulfa antibiotics   Review of Systems Review of Systems  All other systems reviewed and are negative.    Physical Exam Updated Vital Signs BP (!) 149/98   Pulse 72   Temp 98.4 F (36.9 C) (Oral)   Resp 16   Ht 5\' 10"  (1.778 m)   Wt 90.3 kg (199 lb)   SpO2 100%   BMI 28.55 kg/m   Physical Exam  Constitutional: She is oriented to person, place, and time. She appears well-developed and well-nourished.  HENT:  Head: Normocephalic  and atraumatic.  Cardiovascular: Normal rate and regular rhythm.   No murmur heard. Pulmonary/Chest: Effort normal and breath sounds normal. No respiratory distress.  Abdominal: Soft. There is no tenderness. There is no rebound and no guarding.  Musculoskeletal: She exhibits no edema or tenderness.  Single-lumen PICC line in the right upper extremity with no surrounding erythema or edema  Neurological: She is alert and oriented to person, place, and time.  Skin: Skin is warm and dry.  Psychiatric: She has a normal mood and affect. Her behavior is normal.  Nursing note and vitals reviewed.    ED Treatments / Results  Labs (all labs ordered are listed, but only abnormal results are displayed) Labs Reviewed  BASIC METABOLIC PANEL  CBC WITH DIFFERENTIAL/PLATELET  VANCOMYCIN, RANDOM  C-REACTIVE PROTEIN  SEDIMENTATION RATE    EKG  EKG Interpretation None       Radiology No results found.  Procedures Procedures (including critical care time)  Medications Ordered in ED Medications  alteplase (CATHFLO ACTIVASE) injection 2 mg (not administered)     Initial Impression / Assessment and Plan / ED Course  I have reviewed the triage vital signs and the nursing notes.  Pertinent labs & imaging results that were available during my care of the patient were reviewed by me and considered in my medical decision making (see chart for details).     Patient here on IV  antibiotics being treated for an infection in her left hip.  She is well-appearing on examination.  She has been receiving her antibiotics but has not gotten routine laboratory testing in the last week due to difficulties drawing back on the catheter.  IV team has been consulted for flushing of the catheter with hopes to check her baseline labs.  Patient care transferred pending labs and catheter flushed with plan to discharge home if labs are near her baseline.  Final Clinical Impressions(s) / ED Diagnoses   Final diagnoses:  None    New Prescriptions New Prescriptions   No medications on file     Quintella Reichert, MD 10/16/17 1510

## 2017-10-16 NOTE — ED Notes (Signed)
Pt stable, ambulatory, and verbalizes understanding of d/c instructions.  Pt in a hurry to get home (to a special needs child) and did not take time to sign.

## 2017-10-16 NOTE — ED Triage Notes (Signed)
Pt states sent here by Dr Graylon Good to have PICC line assessed.  Pt has been able to give herself vanc through the picc, but unable to have blood drawn.

## 2017-10-23 ENCOUNTER — Other Ambulatory Visit: Payer: Self-pay | Admitting: Pharmacist

## 2017-10-24 ENCOUNTER — Other Ambulatory Visit (INDEPENDENT_AMBULATORY_CARE_PROVIDER_SITE_OTHER): Payer: Self-pay | Admitting: Physician Assistant

## 2017-10-24 ENCOUNTER — Telehealth (INDEPENDENT_AMBULATORY_CARE_PROVIDER_SITE_OTHER): Payer: Self-pay | Admitting: Orthopaedic Surgery

## 2017-10-24 MED ORDER — HYDROCODONE-ACETAMINOPHEN 5-325 MG PO TABS: 1.0000 | ORAL_TABLET | Freq: Three times a day (TID) | ORAL | 0 refills | Status: DC | PRN

## 2017-10-24 NOTE — Telephone Encounter (Signed)
She can come and pick up a prescription for some more hydrocodone.

## 2017-10-24 NOTE — Telephone Encounter (Signed)
Patient called needing Rx refilled (Hydrocodone) The number to contact patient is [phone]

## 2017-10-24 NOTE — Telephone Encounter (Signed)
Please advise 

## 2017-10-25 NOTE — Telephone Encounter (Signed)
I called patient and advised. Script at front for pick up 

## 2017-10-28 ENCOUNTER — Telehealth: Payer: Self-pay | Admitting: *Deleted

## 2017-10-28 NOTE — Telephone Encounter (Signed)
[facility] nurse called stating she was unable to obtain labs from patient's picc line. Patient did not want a peripheral stick as she is coming in for an appointment here tomorrow. Ashley Shepherd

## 2017-10-29 ENCOUNTER — Ambulatory Visit (INDEPENDENT_AMBULATORY_CARE_PROVIDER_SITE_OTHER): Payer: Medicare Other | Admitting: Internal Medicine

## 2017-10-29 ENCOUNTER — Encounter: Payer: Self-pay | Admitting: Internal Medicine

## 2017-10-29 VITALS — BP 160/90 | HR 91 | Temp 97.8°F | Wt 206.0 lb

## 2017-10-29 DIAGNOSIS — B20 Human immunodeficiency virus [HIV] disease: Secondary | ICD-10-CM | POA: Diagnosis not present

## 2017-10-29 DIAGNOSIS — T8450XS Infection and inflammatory reaction due to unspecified internal joint prosthesis, sequela: Secondary | ICD-10-CM | POA: Diagnosis not present

## 2017-10-29 MED ORDER — BICTEGRAVIR-EMTRICITAB-TENOFOV 50-200-25 MG PO TABS: 1.0000 | ORAL_TABLET | Freq: Every day | ORAL | 5 refills | Status: DC

## 2017-10-29 MED ORDER — CEPHALEXIN 500 MG PO TABS: 500.0000 mg | ORAL_TABLET | Freq: Three times a day (TID) | ORAL | 3 refills | Status: DC

## 2017-10-29 NOTE — Patient Instructions (Addendum)
Once you get the new medication, biktarvy (which is one pill once a day) you may stop taking tivicay and descovy. Continue with selzentry   For your hip infection, you will be taking keflex (Cephalexin) take 1 tab 4 times per day - we will plan for 3 months

## 2017-10-29 NOTE — Progress Notes (Signed)
RFV: follow up on pji  Patient ID: Ashley Shepherd, female   DOB: 19-Aug-1956, 61 y.o.   MRN: [id number]  HPI Ms Reust is a pleasant 61yo F with well controlled hiv disease, CD 4 count of 553/Vl<20 (sep 2018) on tivicay/descovy/maraviroc. 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 The patient was rehospitalized this past July for UTI then noted to have left hip pain. She was found to have Klebsiella UTI however also had evidence of PJI. Aspirate showed GPC in clusters on gram stain but negative on culture. She was discharged on vancomycin plus cefazolin x 6 wks to end on 9/22. However having some drainage from her surgical incision and inflammatory markers were still elevated. She had repeat mri of hip which showed fluid collection that was thought to be seroma thus no repeat I X D or aspiration taken at that time. Her IV abtx have been extended til now thus roughly has been abtx for 12 wk. cx was no growth but had been on abtx for uti at the time. Gram stain showed gpc in prs and clusters.    Lab Results  Component Value Date   ESRSEDRATE 46 (H) 10/16/2017   Lab Results  Component Value Date   CRP 1.2 (H) 10/16/2017     Outpatient Encounter Medications as of 10/29/2017  Medication Sig  . ALPRAZolam (XANAX) 0.5 MG tablet TAKE ONE TABLET (0.5 MG TOTAL) BY MOUTH AT BEDTIME AS NEEDED FOR SLEEP.  Marland Kitchen aspirin EC 81 MG tablet Take 81 mg by mouth daily.  . Calcium Citrate-Vitamin D (CALCIUM + D PO) Take 1 tablet by mouth daily.  Marland Kitchen ceFAZolin (ANCEF) 10 g injection   . ceFEPIme (MAXIPIME) 2 g injection Inject 2 g into the muscle 3 (three) times daily.  . citalopram (CELEXA) 20 MG tablet Take 20 mg by mouth every morning.   . cyclobenzaprine (FLEXERIL) 5 MG tablet TAKE 1 TABLET BY MOUTH 3 TIMES A DAY AS NEEDED FOR MUSCLE SPASMS  . dolutegravir (TIVICAY) 50 MG tablet Take 50 mg by mouth 2 (two) times daily.   Marland Kitchen  emtricitabine-tenofovir AF (DESCOVY) 200-25 MG tablet Take 1 tablet by mouth daily.  . ferrous sulfate 325 (65 FE) MG tablet Take 325 mg by mouth 2 (two) times daily.   Marland Kitchen HYDROcodone-acetaminophen (NORCO/VICODIN) 5-325 MG tablet Take 1-2 tablets by mouth 3 (three) times daily as needed for moderate pain.  . maraviroc (SELZENTRY) 300 MG tablet Take 300 mg by mouth 2 (two) times daily. Reported on 01/24/2016  . Multiple Vitamin (MULTIVITAMIN WITH MINERALS) TABS tablet Take 1 tablet by mouth daily.  . ondansetron (ZOFRAN) 4 MG tablet Take 1 tablet (4 mg total) by mouth every 6 (six) hours as needed for nausea or vomiting.  . Oxycodone HCl 10 MG TABS Take 1-1.5 tablets (10-15 mg total) by mouth every 3 (three) hours as needed.  . sodium chloride 0.9 % infusion   . vancomycin (VANCOCIN) 10 G SOLR injection Inject 10 g as directed daily.    No facility-administered encounter medications on file as of 10/29/2017.      Patient Active Problem List   Diagnosis Date Noted  . Surgery, elective   . Abscess of left hip   . Klebsiella sepsis ([place]) 07/27/2017  . Pyelonephritis 07/23/2017  . Depression with anxiety 07/23/2017  . Acute pyelonephritis   . Encounter for imaging study to confirm  nasogastric (NG) tube placement   . History of left hip replacement 04/06/2016  . HIV disease ([place])   . CKD (chronic kidney disease) stage 3, GFR 30-59 ml/min (HCC)   . 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[place]) 01/24/2016  . 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[place]) 01/24/2016  . Avascular necrosis of bone of left hip ([place]) 10/01/2014  . 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 FB GI (foreign body in gastrointestinal tract) 04/09/2014     Health Maintenance Due  Topic Date Due  . Hepatitis C Screening  07-21-1956  . MAMMOGRAM  03/18/2006     Review of Systems Improvement with left hip pain. Otherwise 12 point ros is negative Physical Exam   BP (!) 160/90   Pulse 91   Temp 97.8 F  (36.6 C) (Oral)   Wt 206 lb (93.4 kg)   BMI 29.56 kg/m   Physical Exam  Constitutional:  oriented to person, place, and time. appears well-developed and well-nourished. No distress.  HENT: [employer]/AT, PERRLA, no scleral icterus Mouth/Throat: Oropharynx is clear and moist. No oropharyngeal exudate.  Skin: well healed incision left hip Psychiatric: a normal mood and affect.  behavior is normal.   CBC Lab Results  Component Value Date   WBC 5.9 10/16/2017   RBC 4.08 10/16/2017   HGB 11.8 (L) 10/16/2017   HCT 36.3 10/16/2017   PLT 218 10/16/2017   MCV 89.0 10/16/2017   MCH 28.9 10/16/2017   MCHC 32.5 10/16/2017   RDW 16.0 (H) 10/16/2017   LYMPHSABS 2.5 10/16/2017   MONOABS 0.5 10/16/2017   EOSABS 0.2 10/16/2017    BMET Lab Results  Component Value Date   NA 140 10/16/2017   K 3.4 (L) 10/16/2017   CL 105 10/16/2017   CO2 30 10/16/2017   GLUCOSE 108 (H) 10/16/2017   BUN 11 10/16/2017   CREATININE 1.06 (H) 10/16/2017   CALCIUM 9.5 10/16/2017   GFRNONAA 55 (L) 10/16/2017   GFRAA >60 10/16/2017      Assessment and Plan   hiv = will change her to biktarvy plus maraviroc and discontinue tivicay and descovy  pji = pull picc line on Friday. Sed rate and crp. Will start her on cephalexin 500mg  QID

## 2017-10-30 LAB — SEDIMENTATION RATE: Sed Rate: 51 mm/h — ABNORMAL HIGH (ref 0–30)

## 2017-10-30 LAB — C-REACTIVE PROTEIN: CRP: 21.2 mg/L — AB (ref <8.0)

## 2017-10-31 ENCOUNTER — Other Ambulatory Visit: Payer: Self-pay | Admitting: Pharmacist

## 2017-11-06 ENCOUNTER — Ambulatory Visit (INDEPENDENT_AMBULATORY_CARE_PROVIDER_SITE_OTHER): Payer: Medicare Other | Admitting: Physician Assistant

## 2017-11-06 ENCOUNTER — Encounter (INDEPENDENT_AMBULATORY_CARE_PROVIDER_SITE_OTHER): Payer: Self-pay | Admitting: Physician Assistant

## 2017-11-06 DIAGNOSIS — Z96649 Presence of unspecified artificial hip joint: Secondary | ICD-10-CM

## 2017-11-06 DIAGNOSIS — T8459XD Infection and inflammatory reaction due to other internal joint prosthesis, subsequent encounter: Secondary | ICD-10-CM

## 2017-11-06 MED ORDER — HYDROCODONE-ACETAMINOPHEN 5-325 MG PO TABS: 1.0000 | ORAL_TABLET | Freq: Three times a day (TID) | ORAL | 0 refills | Status: DC | PRN

## 2017-11-06 NOTE — Progress Notes (Signed)
Office Visit Note   Patient: Ashley Shepherd           Date of Birth: 1956/02/05           MRN: [id number] Visit Date: 11/06/2017              Requested by: Medicine, [facility] Internal No address on file PCP: Medicine, [facility] Internal   Assessment & Plan: Visit Diagnoses:  1. Infection of prosthetic total hip joint, subsequent encounter     Plan: She will continue her oral antibiotics discussed with her and went over tissue massage to breakdown the scar tissue.  Continue to wash the incision with antibacterial soap.  She will follow with [place] in 2 months sooner if there is any questions or concerns.  Refill on her hydrocodone was given today.  Follow-Up Instructions: Return in about 2 months (around 01/06/2018).   Orders:  No orders of the defined types were placed in this encounter.  Meds ordered this encounter  Medications  . HYDROcodone-acetaminophen (NORCO/VICODIN) 5-325 MG tablet    Sig: Take 1-2 tablets 3 (three) times daily as needed by mouth for moderate pain.    Dispense:  60 tablet    Refill:  0      Procedures: No procedures performed   Clinical Data: No additional findings.   Subjective: Chief Complaint  Patient presents with  . Left Hip - Follow-up    HPI Ashley Shepherd returns today follow-up of her left hip status post irrigation and debridement with antibiotics lead placement.  She states she has had no fevers chills or drainage from the hip.  She has had her PICC line removed.  Now on oral antibiotics.  She is due to see infectious disease on December 17.. Review of Systems Denies fevers chills, chest pain, shortness of breath, drainage from left hip incision  Objective: Vital Signs: There were no vitals taken for this visit.  Physical Exam  Constitutional: She appears well-developed and well-nourished. No distress.  Pulmonary/Chest: Effort normal.  Skin: She is not diaphoretic.  Psychiatric: She has a normal mood and  affect.    Ortho Exam Left hip no active drainage.  No signs of seroma.  No abnormal warmth erythema.  She did develop a significant amount of scarring and adipose necrosis.  Good range of motion of the hip with minimal discomfort.  Specialty Comments:  No specialty comments available.  Imaging: No results found.   PMFS History: Patient Active Problem List   Diagnosis Date Noted  . Surgery, elective   . Abscess of left hip   . Klebsiella sepsis ([place]) 07/27/2017  . Pyelonephritis 07/23/2017  . Depression with anxiety 07/23/2017  . Acute pyelonephritis   . Encounter for imaging study to confirm nasogastric (NG) tube placement   . History of left hip replacement 04/06/2016  . HIV disease ([place])   . CKD (chronic kidney disease) stage 3, GFR 30-59 ml/min (HCC)   . 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[place]) 01/24/2016  . 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[place]) 01/24/2016  . Avascular necrosis of bone of left hip ([place]) 10/01/2014  . 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 FB GI (foreign body in gastrointestinal tract) 04/09/2014   Past Medical History:  Diagnosis Date  . Anemia   . Anxiety   . Arthritis   . 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[place])    left  . Cancer ([place])    7 years ago, cancer  free now  . Depression   . Difficulty sleeping   . GERD (gastroesophageal reflux disease)   . History of kidney stones   . History of transfusion   . HIV disease ([place])   . Kidney stones   . Pneumonia     Family History  Problem Relation Age of Onset  . Hypertension Father   . Diabetes Father   . Heart attack Father   . Hypertension Other   . Diabetes Other     Past Surgical History:  Procedure Laterality Date  . APPENDECTOMY    . COLON SURGERY  2008  . COLOSTOMY REVERSAL  2013  . KNEE ARTHROSCOPY     left  . PORT-A-CATH REMOVAL     Social History   Occupational History  . Not on file  Tobacco Use  . Smoking status: Current Every Day Smoker     Packs/day: 0.50    Types: Cigarettes  . Smokeless tobacco: Never Used  . Tobacco comment: Using 1-800 quit  Substance and Sexual Activity  . Alcohol use: No  . Drug use: No  . Sexual activity: Not on file

## 2017-11-26 ENCOUNTER — Other Ambulatory Visit (INDEPENDENT_AMBULATORY_CARE_PROVIDER_SITE_OTHER): Payer: Self-pay | Admitting: Physician Assistant

## 2017-11-26 NOTE — Telephone Encounter (Signed)
[place] patient

## 2017-11-27 ENCOUNTER — Telehealth (INDEPENDENT_AMBULATORY_CARE_PROVIDER_SITE_OTHER): Payer: Self-pay | Admitting: Physician Assistant

## 2017-11-27 MED ORDER — HYDROCODONE-ACETAMINOPHEN 5-325 MG PO TABS: 1.0000 | ORAL_TABLET | Freq: Three times a day (TID) | ORAL | 0 refills | Status: DC | PRN

## 2017-11-27 NOTE — Telephone Encounter (Signed)
Patient aware Rx ready at front desk  

## 2017-11-27 NOTE — Telephone Encounter (Signed)
Returned call to patient concerning refill on Rx. Message was sent back concerning fill yesterday.

## 2017-11-27 NOTE — Telephone Encounter (Signed)
Please advise 

## 2017-12-01 ENCOUNTER — Other Ambulatory Visit: Payer: Self-pay | Admitting: Internal Medicine

## 2017-12-01 ENCOUNTER — Other Ambulatory Visit (INDEPENDENT_AMBULATORY_CARE_PROVIDER_SITE_OTHER): Payer: Self-pay | Admitting: Physician Assistant

## 2017-12-01 DIAGNOSIS — B3731 Acute candidiasis of vulva and vagina: Secondary | ICD-10-CM

## 2017-12-01 DIAGNOSIS — B373 Candidiasis of vulva and vagina: Secondary | ICD-10-CM

## 2017-12-03 ENCOUNTER — Telehealth: Payer: Self-pay | Admitting: *Deleted

## 2017-12-03 NOTE — Telephone Encounter (Signed)
Patient requesting refill of diflucan. RX written 10/23 while patient on iv antibiotics for prosthetic hip infection. RN left message asking if patient was symptomatic or if this was an automatic refill request. Landis Gandy, RN

## 2017-12-03 NOTE — Telephone Encounter (Signed)
Rx request 

## 2017-12-04 NOTE — Telephone Encounter (Signed)
Yes. Does she have upcoming appt

## 2017-12-05 NOTE — Telephone Encounter (Signed)
12/18 with you.

## 2017-12-10 ENCOUNTER — Ambulatory Visit: Payer: Medicare Other | Admitting: Internal Medicine

## 2018-01-06 ENCOUNTER — Ambulatory Visit (INDEPENDENT_AMBULATORY_CARE_PROVIDER_SITE_OTHER): Payer: Medicare Other

## 2018-01-06 ENCOUNTER — Ambulatory Visit (INDEPENDENT_AMBULATORY_CARE_PROVIDER_SITE_OTHER): Payer: Medicare Other | Admitting: Orthopaedic Surgery

## 2018-01-06 ENCOUNTER — Encounter (INDEPENDENT_AMBULATORY_CARE_PROVIDER_SITE_OTHER): Payer: Self-pay | Admitting: Orthopaedic Surgery

## 2018-01-06 DIAGNOSIS — T8459XD Infection and inflammatory reaction due to other internal joint prosthesis, subsequent encounter: Secondary | ICD-10-CM

## 2018-01-06 DIAGNOSIS — Z96649 Presence of unspecified artificial hip joint: Secondary | ICD-10-CM

## 2018-01-06 MED ORDER — HYDROCODONE-ACETAMINOPHEN 5-325 MG PO TABS: 1.0000 | ORAL_TABLET | Freq: Two times a day (BID) | ORAL | 0 refills | Status: DC | PRN

## 2018-01-06 NOTE — Progress Notes (Signed)
Office Visit Note   Patient: Ashley Shepherd           Date of Birth: 1956/06/24           MRN: [id number] Visit Date: 01/06/2018              Requested by: Medicine, [facility] Internal No address on file PCP: Medicine, [facility] Internal   Assessment & Plan: Visit Diagnoses:  1. Infection of prosthetic total hip joint, subsequent encounter     Plan: At this point we will continue to follow her with just observation from a orthopedic standpoint.  I did refill her hydrocodone today but told her this would be the last one and that she needs to try to wean herself from narcotics.  I am fine with her trying topical anti-inflammatory over the proximal aspect of her incision that is painful but otherwise will just continue to see her in observation.  From my standpoint I do not to see her back for 6 months with a repeat AP and lateral left hip unless something is worrisome.  Follow-Up Instructions: Return in about 6 months (around 07/06/2018).   Orders:  Orders Placed This Encounter  Procedures  . XR HIP UNILAT W OR W/O PELVIS 2-3 VIEWS LEFT    Meds ordered this encounter  Medications  . HYDROcodone-acetaminophen (NORCO/VICODIN) 5-325 MG tablet    Sig: Take 1-2 tablets by mouth 2 (two) times daily as needed for moderate pain.    Dispense:  60 tablet    Refill:  0      Procedures: No procedures performed   Clinical Data: No additional findings.   Subjective: Chief Complaint  Patient presents with  . Left Hip - Follow-up  The patient is now 5 months status post irrigation debridement of a left hip infection.  She is someone who had had a total hip arthroplasty that was done in 2015.  She subsequently developed an infection with strep and we remove the prosthesis and treated her for long-term antibiotics and had a temporary antibiotic spacer hip.  She was then taken back to the operating room in April 2017 for removal of the spacer and revision hip  arthroplasty.  She done well for a while and then in August 2018 we had to take her to the operating room for a significant irrigation and debridement of that hip.  She is now 5 months out from the surgery and still has some pain at the top of her incision but otherwise no deep and aching pain.  She still followed closely by the infectious disease clinic.  HPI  Review of Systems She currently denies any headache, chest pain, shortness of breath, fever, chills, nausea, vomiting.  Objective: Vital Signs: There were no vitals taken for this visit.  Physical Exam She is alert and oriented x3 and in no acute distress she still walks with a cane. Ortho Exam Examination of her left hip shows a well-healed surgical incision.  There is no erythema or induration of the skin.  There is no drainage.  The muscles around her proximal left hip and femur feel normal.  Specialty Comments:  No specialty comments available.  Imaging: Xr Hip Unilat W Or W/o Pelvis 2-3 Views Left  Result Date: 01/06/2018 An AP and lateral of the left hip shows a well-seated total hip arthroplasty.  There is no lucency around the femoral or acetabular component.  There is no worrisome findings for infection.  There is no  periosteal reaction.    PMFS History: Patient Active Problem List   Diagnosis Date Noted  . Surgery, elective   . Abscess of left hip   . Klebsiella sepsis ([place]) 07/27/2017  . Pyelonephritis 07/23/2017  . Depression with anxiety 07/23/2017  . Acute pyelonephritis   . Encounter for imaging study to confirm nasogastric (NG) tube placement   . History of left hip replacement 04/06/2016  . HIV disease ([place])   . CKD (chronic kidney disease) stage 3, GFR 30-59 ml/min (HCC)   . 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[place]) 01/24/2016  . 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[place]) 01/24/2016  . Avascular necrosis of bone of left hip ([place]) 10/01/2014  . Status post total replacement of left hip 10/01/2014   . FB GI (foreign body in gastrointestinal tract) 04/09/2014   Past Medical History:  Diagnosis Date  . Anemia   . Anxiety   . Arthritis   . 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[place])    left  . Cancer ([place])    7 years ago, cancer free now  . Depression   . Difficulty sleeping   . GERD (gastroesophageal reflux disease)   . History of kidney stones   . History of transfusion   . HIV disease ([place])   . Kidney stones   . Pneumonia     Family History  Problem Relation Age of Onset  . Hypertension Father   . Diabetes Father   . Heart attack Father   . Hypertension Other   . Diabetes Other     Past Surgical History:  Procedure Laterality Date  . ANTERIOR HIP REVISION Left 04/06/2016   Procedure: REMOVAL OF TEMPORARY ANTIBIOTIC SPACER LEFT HIP, LEFT TOTAL HIP REVISION;  Surgeon: Mcarthur Rossetti, MD;  Location: [facility];  Service: Orthopedics;  Laterality: Left;  . ANTERIOR HIP REVISION Left 08/02/2017   Procedure: Irrigation and debridement left hip with antibiotic bead placement;  Surgeon: Mcarthur Rossetti, MD;  Location: [facility];  Service: Orthopedics;  Laterality: Left;  . APPENDECTOMY    . COLON SURGERY  2008  . COLOSTOMY REVERSAL  2013  . EXCISIONAL TOTAL HIP ARTHROPLASTY WITH ANTIBIOTIC SPACERS Left 01/24/2016   Procedure: EXCISION OF LEFT TOTAL HIP ARTHROPLASTY WITH PLACEMENT OF ANTIBIOTIC SPACERS;  Surgeon: Mcarthur Rossetti, MD;  Location: [place];  Service: Orthopedics;  Laterality: Left;  . INCISION AND DRAINAGE HIP Left 01/24/2016   Procedure: IRRIGATION AND DEBRIDEMENT LEFT HIP;  Surgeon: Mcarthur Rossetti, MD;  Location: [place];  Service: Orthopedics;  Laterality: Left;  . KNEE ARTHROSCOPY     left  . PORT-A-CATH REMOVAL    . TOTAL HIP ARTHROPLASTY Left 10/01/2014   Procedure: LEFT TOTAL HIP ARTHROPLASTY ANTERIOR APPROACH;  Surgeon: Mcarthur Rossetti, MD;  Location: [facility];  Service: Orthopedics;  Laterality: Left;   Social History   Occupational History   . Not on file  Tobacco Use  . Smoking status: Current Every Day Smoker    Packs/day: 0.50    Types: Cigarettes  . Smokeless tobacco: Never Used  . Tobacco comment: Using 1-800 quit  Substance and Sexual Activity  . Alcohol use: No  . Drug use: No  . Sexual activity: Not on file

## 2018-01-21 ENCOUNTER — Ambulatory Visit (INDEPENDENT_AMBULATORY_CARE_PROVIDER_SITE_OTHER): Payer: Medicare Other | Admitting: Internal Medicine

## 2018-01-21 ENCOUNTER — Telehealth: Payer: Self-pay

## 2018-01-21 ENCOUNTER — Encounter: Payer: Self-pay | Admitting: Internal Medicine

## 2018-01-21 VITALS — BP 121/81 | HR 80 | Temp 98.3°F | Ht 70.5 in | Wt 216.0 lb

## 2018-01-21 DIAGNOSIS — B20 Human immunodeficiency virus [HIV] disease: Secondary | ICD-10-CM

## 2018-01-21 DIAGNOSIS — T8450XS Infection and inflammatory reaction due to unspecified internal joint prosthesis, sequela: Secondary | ICD-10-CM

## 2018-01-21 DIAGNOSIS — Z8659 Personal history of other mental and behavioral disorders: Secondary | ICD-10-CM | POA: Diagnosis not present

## 2018-01-21 NOTE — Progress Notes (Signed)
RFV: pji follow up  Patient ID: Ashley Shepherd, female   DOB: 09-22-1956, 62 y.o.   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 She reports that she is doing well. Had a good visit with dr. Ninfa Linden a few weeks ago. Please with her healing process. She is ambulating without difficulty, back to work. Occasionally uses topical cream for pain.  Outpatient Encounter Medications as of 01/21/2018  Medication Sig  . ALPRAZolam (XANAX) 0.5 MG tablet TAKE ONE TABLET (0.5 MG TOTAL) BY MOUTH AT BEDTIME AS NEEDED FOR SLEEP.  Marland Kitchen aspirin EC 81 MG tablet Take 81 mg by mouth daily.  . Calcium Citrate-Vitamin D (CALCIUM + D PO) Take 1 tablet by mouth daily.  . Cephalexin 500 MG tablet Take 1 tablet (500 mg total) 3 (three) times daily by mouth.  . citalopram (CELEXA) 20 MG tablet Take 20 mg by mouth every morning.   . dolutegravir (TIVICAY) 50 MG tablet Take 50 mg by mouth 2 (two) times daily.   Marland Kitchen emtricitabine-tenofovir AF (DESCOVY) 200-25 MG tablet TAKE ONE TABLET BY MOUTH DAILY.  . ferrous sulfate 325 (65 FE) MG tablet Take 325 mg by mouth 2 (two) times daily.   . maraviroc (SELZENTRY) 300 MG tablet Take 300 mg by mouth 2 (two) times daily. Reported on 01/24/2016  . Multiple Vitamin (MULTIVITAMIN WITH MINERALS) TABS tablet Take 1 tablet by mouth daily.  . bictegravir-emtricitabine-tenofovir AF (BIKTARVY) 50-200-25 MG TABS tablet Take 1 tablet daily by mouth. (Patient not taking: Reported on 01/21/2018)  . ceFAZolin (ANCEF) 10 g injection   . cyclobenzaprine (FLEXERIL) 5 MG tablet TAKE 1 TABLET BY MOUTH 3 TIMES A DAY AS NEEDED FOR MUSCLE SPASMS (Patient not taking: Reported on 01/21/2018)  . HYDROcodone-acetaminophen (NORCO/VICODIN) 5-325 MG tablet Take 1-2 tablets by mouth 2 (two) times daily as needed for moderate pain.  Marland Kitchen ondansetron (ZOFRAN) 4 MG tablet Take 1 tablet (4 mg total) by mouth every 6 (six) hours as needed  for nausea or vomiting.  . Oxycodone HCl 10 MG TABS Take 1-1.5 tablets (10-15 mg total) by mouth every 3 (three) hours as needed. (Patient not taking: Reported on 01/21/2018)  . sodium chloride 0.9 % infusion   . vancomycin (VANCOCIN) 10 G SOLR injection Inject 10 g as directed daily.    No facility-administered encounter medications on file as of 01/21/2018.      Patient Active Problem List   Diagnosis Date Noted  . Surgery, elective   . Abscess of left hip   . Klebsiella sepsis ([place]) 07/27/2017  . Pyelonephritis 07/23/2017  . Depression with anxiety 07/23/2017  . Acute pyelonephritis   . Encounter for imaging study to confirm nasogastric (NG) tube placement   . History of left hip replacement 04/06/2016  . HIV disease ([place])   . CKD (chronic kidney disease) stage 3, GFR 30-59 ml/min (HCC)   . 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[place]) 01/24/2016  . 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[place]) 01/24/2016  . Avascular necrosis of bone of left hip ([place]) 10/01/2014  . 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 FB GI (foreign body in gastrointestinal tract) 04/09/2014     Health Maintenance Due  Topic Date Due  . Hepatitis C Screening  1956/07/01  . MAMMOGRAM  03/18/2006     Review of Systems  Constitutional: Negative for fever, chills, diaphoresis, activity change, appetite change, fatigue and unexpected  weight change.  HENT: Negative for congestion, sore throat, rhinorrhea, sneezing, trouble swallowing and sinus pressure.  Eyes: Negative for photophobia and visual disturbance.  Respiratory: Negative for cough, chest tightness, shortness of breath, wheezing and stridor.  Cardiovascular: Negative for chest pain, palpitations and leg swelling.  Gastrointestinal: Negative for nausea, vomiting, abdominal pain, diarrhea, constipation, blood in stool, abdominal distention and anal bleeding.  Genitourinary: Negative for dysuria, hematuria, flank pain and difficulty  urinating.  Musculoskeletal: Negative for myalgias, back pain, joint swelling, arthralgias and gait problem.  Skin: Negative for color change, pallor, rash and wound.  Neurological: Negative for dizziness, tremors, weakness and light-headedness.  Hematological: Negative for adenopathy. Does not bruise/bleed easily.  Psychiatric/Behavioral: Negative for behavioral problems, confusion, sleep disturbance, dysphoric mood, decreased concentration and agitation.    Physical Exam   BP 121/81   Pulse 80   Temp 98.3 F (36.8 C) (Oral)   Ht 5' 10.5" (1.791 m)   Wt 216 lb (98 kg)   BMI 30.55 kg/m   Physical Exam  Constitutional:  oriented to person, place, and time. appears well-developed and well-nourished. No distress.  HENT: [place]/AT, PERRLA, no scleral icterus Mouth/Throat: Oropharynx is clear and moist. No oropharyngeal exudate.  Cardiovascular: Normal rate, regular rhythm and normal heart sounds. Exam reveals no gallop and no friction rub.  No murmur heard.  Pulmonary/Chest: Effort normal and breath sounds normal. No respiratory distress.  has no wheezes.  Neck = supple, no nuchal rigidity Abdominal: Soft. Bowel sounds are normal.  exhibits no distension. There is no tenderness.  Lymphadenopathy: no cervical adenopathy. No axillary adenopathy Neurological: alert and oriented to person, place, and time.  Skin: Skin is warm and dry. No rash noted. No erythema.  Psychiatric: a normal mood and affect.  behavior is normal.   CBC Lab Results  Component Value Date   WBC 5.9 10/16/2017   RBC 4.08 10/16/2017   HGB 11.8 (L) 10/16/2017   HCT 36.3 10/16/2017   PLT 218 10/16/2017   MCV 89.0 10/16/2017   MCH 28.9 10/16/2017   MCHC 32.5 10/16/2017   RDW 16.0 (H) 10/16/2017   LYMPHSABS 2.5 10/16/2017   MONOABS 0.5 10/16/2017   EOSABS 0.2 10/16/2017    BMET Lab Results  Component Value Date   NA 140 10/16/2017   K 3.4 (L) 10/16/2017   CL 105 10/16/2017   CO2 30 10/16/2017   GLUCOSE 108  (H) 10/16/2017   BUN 11 10/16/2017   CREATININE 1.06 (H) 10/16/2017   CALCIUM 9.5 10/16/2017   GFRNONAA 55 (L) 10/16/2017   GFRAA >60 10/16/2017      Assessment and Plan  pji = continue on cephalexin for additional 3 months  hiv disease = continue on current regimen. She is interested in transition her care to our clinic due to location, easier to come to [place]. We will check hiv vl and cd 4 count and have her records transferred.  Depression = stable. No need to change meds

## 2018-01-21 NOTE — Telephone Encounter (Signed)
Faxed release of medical records to Dr. Caryn Section per Dr. Bunnie Domino request. Confirmation of fax was received. Pola Corn, LPN

## 2018-01-22 LAB — CBC WITH DIFFERENTIAL/PLATELET
BASOS ABS: 20 {cells}/uL (ref 0–200)
BASOS PCT: 0.4 %
EOS ABS: 172 {cells}/uL (ref 15–500)
Eosinophils Relative: 3.5 %
HCT: 40.4 % (ref 35.0–45.0)
Hemoglobin: 13.8 g/dL (ref 11.7–15.5)
Lymphs Abs: 1759 cells/uL (ref 850–3900)
MCH: 29.7 pg (ref 27.0–33.0)
MCHC: 34.2 g/dL (ref 32.0–36.0)
MCV: 87.1 fL (ref 80.0–100.0)
MPV: 12.6 fL — AB (ref 7.5–12.5)
Monocytes Relative: 9.2 %
NEUTROS PCT: 51 %
Neutro Abs: 2499 cells/uL (ref 1500–7800)
PLATELETS: 196 10*3/uL (ref 140–400)
RBC: 4.64 10*6/uL (ref 3.80–5.10)
RDW: 16.4 % — ABNORMAL HIGH (ref 11.0–15.0)
TOTAL LYMPHOCYTE: 35.9 %
WBC: 4.9 10*3/uL (ref 3.8–10.8)
WBCMIX: 451 {cells}/uL (ref 200–950)

## 2018-01-22 LAB — T-HELPER CELL (CD4) - (RCID CLINIC ONLY)
CD4 T CELL HELPER: 28 % — AB (ref 33–55)
CD4 T Cell Abs: 540 /uL (ref 400–2700)

## 2018-01-22 LAB — BASIC METABOLIC PANEL
BUN/Creatinine Ratio: 11 (calc) (ref 6–22)
BUN: 14 mg/dL (ref 7–25)
CHLORIDE: 105 mmol/L (ref 98–110)
CO2: 26 mmol/L (ref 20–32)
CREATININE: 1.31 mg/dL — AB (ref 0.50–0.99)
Calcium: 10 mg/dL (ref 8.6–10.4)
Glucose, Bld: 107 mg/dL — ABNORMAL HIGH (ref 65–99)
POTASSIUM: 4.1 mmol/L (ref 3.5–5.3)
Sodium: 144 mmol/L (ref 135–146)

## 2018-01-22 LAB — SPECIMEN COMPROMISED

## 2018-01-22 LAB — SEDIMENTATION RATE: Sed Rate: 43 mm/h — ABNORMAL HIGH (ref 0–30)

## 2018-01-22 LAB — RPR: RPR: NONREACTIVE

## 2018-01-22 LAB — C-REACTIVE PROTEIN: CRP: 24.3 mg/L — ABNORMAL HIGH (ref <8.0)

## 2018-01-23 LAB — HIV-1 RNA QUANT-NO REFLEX-BLD
HIV 1 RNA QUANT: 52 {copies}/mL — AB
HIV-1 RNA QUANT, LOG: 1.72 {Log_copies}/mL — AB

## 2018-03-13 ENCOUNTER — Other Ambulatory Visit: Payer: Self-pay | Admitting: *Deleted

## 2018-03-20 ENCOUNTER — Other Ambulatory Visit: Payer: Self-pay | Admitting: *Deleted

## 2018-03-20 ENCOUNTER — Other Ambulatory Visit: Payer: Medicare Other

## 2018-03-20 DIAGNOSIS — B20 Human immunodeficiency virus [HIV] disease: Secondary | ICD-10-CM

## 2018-03-31 ENCOUNTER — Other Ambulatory Visit: Payer: Self-pay | Admitting: Internal Medicine

## 2018-04-03 ENCOUNTER — Ambulatory Visit: Payer: Medicare Other | Admitting: Internal Medicine

## 2018-04-08 ENCOUNTER — Ambulatory Visit (INDEPENDENT_AMBULATORY_CARE_PROVIDER_SITE_OTHER): Payer: Medicare Other | Admitting: Internal Medicine

## 2018-04-08 ENCOUNTER — Encounter: Payer: Self-pay | Admitting: Internal Medicine

## 2018-04-08 VITALS — BP 124/77 | HR 71 | Temp 98.7°F | Ht 70.0 in | Wt 212.0 lb

## 2018-04-08 DIAGNOSIS — T8450XS Infection and inflammatory reaction due to unspecified internal joint prosthesis, sequela: Secondary | ICD-10-CM | POA: Diagnosis present

## 2018-04-08 NOTE — Progress Notes (Signed)
RFV: left hip pji  Patient ID: Ashley Shepherd, female   DOB: May 27, 1956, 62 y.o.   MRN: [id number]  HPI Ashley Shepherd is a 62yo F with hiv disease, CD 4 count of 540/VL 54, currently on tivicay,descovy, maraviroc. She was treated for group b strep hip pji on her last month of chronic suppression with cephalexin. She denies any significant pain to her left hip, though walks with slight gait abnormality. She sees dr Ninfa Linden in July. She is finishing her last dose today. Denies fevers, chills, diarrhea.  Outpatient Encounter Medications as of 04/08/2018  Medication Sig  . ALPRAZolam (XANAX) 0.5 MG tablet TAKE ONE TABLET (0.5 MG TOTAL) BY MOUTH AT BEDTIME AS NEEDED FOR SLEEP.  Marland Kitchen aspirin EC 81 MG tablet Take 81 mg by mouth daily.  . Calcium Citrate-Vitamin D (CALCIUM + D PO) Take 1 tablet by mouth daily.  . Cephalexin 500 MG tablet Take 1 tablet (500 mg total) 3 (three) times daily by mouth.  . citalopram (CELEXA) 20 MG tablet Take 20 mg by mouth every morning.   . dolutegravir (TIVICAY) 50 MG tablet Take 50 mg by mouth 2 (two) times daily.   Marland Kitchen emtricitabine-tenofovir AF (DESCOVY) 200-25 MG tablet TAKE ONE TABLET BY MOUTH DAILY.  . ferrous sulfate 325 (65 FE) MG tablet Take 325 mg by mouth 2 (two) times daily.   . maraviroc (SELZENTRY) 300 MG tablet Take 300 mg by mouth 2 (two) times daily. Reported on 01/24/2016  . Multiple Vitamin (MULTIVITAMIN WITH MINERALS) TABS tablet Take 1 tablet by mouth daily.  Marland Kitchen ceFAZolin (ANCEF) 10 g injection   . HYDROcodone-acetaminophen (NORCO/VICODIN) 5-325 MG tablet Take 1-2 tablets by mouth 2 (two) times daily as needed for moderate pain. (Patient not taking: Reported on 04/08/2018)  . ondansetron (ZOFRAN) 4 MG tablet Take 1 tablet (4 mg total) by mouth every 6 (six) hours as needed for nausea or vomiting. (Patient not taking: Reported on 04/08/2018)  . sodium chloride 0.9 % infusion   . vancomycin (VANCOCIN) 10 G SOLR injection Inject 10 g as directed daily.    No  facility-administered encounter medications on file as of 04/08/2018.      Patient Active Problem List   Diagnosis Date Noted  . Surgery, elective   . Abscess of left hip   . Klebsiella sepsis ([place]) 07/27/2017  . Pyelonephritis 07/23/2017  . Depression with anxiety 07/23/2017  . Acute pyelonephritis   . Encounter for imaging study to confirm nasogastric (NG) tube placement   . History of left hip replacement 04/06/2016  . HIV disease ([place])   . CKD (chronic kidney disease) stage 3, GFR 30-59 ml/min (HCC)   . 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[place]) 01/24/2016  . Avascular necrosis of bone of left hip ([place]) 10/01/2014  . 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 FB GI (foreign body in gastrointestinal tract) 04/09/2014     Health Maintenance Due  Topic Date Due  . Hepatitis C Screening  Nov 26, 1956  . MAMMOGRAM  03/18/2006  . PAP SMEAR  02/15/2018     Review of Systems  Constitutional: Negative for fever, chills, diaphoresis, activity change, appetite change, fatigue and unexpected weight change.  HENT: Negative for congestion, sore throat, rhinorrhea, sneezing, trouble swallowing and sinus pressure.  Eyes: Negative for photophobia and visual disturbance.  Respiratory: Negative for cough, chest tightness, shortness of breath, wheezing and stridor.  Cardiovascular: Negative for chest pain, palpitations  and leg swelling.  Gastrointestinal: Negative for nausea, vomiting, abdominal pain, diarrhea, constipation, blood in stool, abdominal distention and anal bleeding.  Genitourinary: Negative for dysuria, hematuria, flank pain and difficulty urinating.  Musculoskeletal: Negative for myalgias, back pain, joint swelling, arthralgias and gait problem.  Skin: Negative for color change, pallor, rash and wound.  Neurological: Negative for dizziness, tremors, weakness and light-headedness.  Hematological: Negative  for adenopathy. Does not bruise/bleed easily.  Psychiatric/Behavioral: Negative for behavioral problems, confusion, sleep disturbance, dysphoric mood, decreased concentration and agitation.    Physical Exam   BP 124/77 (BP Location: Right Arm, Patient Position: Sitting, Cuff Size: Normal)   Pulse 71   Temp 98.7 F (37.1 C) (Oral)   Ht 5\' 10"  (1.778 m)   Wt 212 lb (96.2 kg)   BMI 30.42 kg/m   Physical Exam  Constitutional:  oriented to person, place, and time. appears well-developed and well-nourished. No distress.  HENT: Ashley Shepherd/AT, PERRLA, no scleral icterus Mouth/Throat: Oropharynx is clear and moist. No oropharyngeal exudate.  Cardiovascular: Normal rate, regular rhythm and normal heart sounds. Exam reveals no gallop and no friction rub.  No murmur heard.  Pulmonary/Chest: Effort normal and breath sounds normal. No respiratory distress.  has no wheezes.  Neck = supple, no nuchal rigidity Lymphadenopathy: no cervical adenopathy. No axillary adenopathy Neurological: alert and oriented to person, place, and time.  Skin: Skin is warm and dry. No rash noted. No erythema.  Psychiatric: a normal mood and affect.  behavior is normal.   Lab Results  Component Value Date   CD4TCELL 28 (L) 01/21/2018   Lab Results  Component Value Date   CD4TABS 540 01/21/2018   Lab Results  Component Value Date   HIV1RNAQUANT 52 (H) 01/21/2018   No results found for: HEPBSAB Lab Results  Component Value Date   LABRPR NON-REACTIVE 01/21/2018    CBC Lab Results  Component Value Date   WBC 4.9 01/21/2018   RBC 4.64 01/21/2018   HGB 13.8 01/21/2018   HCT 40.4 01/21/2018   PLT 196 01/21/2018   MCV 87.1 01/21/2018   MCH 29.7 01/21/2018   MCHC 34.2 01/21/2018   RDW 16.4 (H) 01/21/2018   LYMPHSABS 1,759 01/21/2018   MONOABS 0.5 10/16/2017   EOSABS 172 01/21/2018    BMET Lab Results  Component Value Date   NA 144 01/21/2018   K 4.1 01/21/2018   CL 105 01/21/2018   CO2 26 01/21/2018    GLUCOSE 107 (H) 01/21/2018   BUN 14 01/21/2018   CREATININE 1.31 (H) 01/21/2018   CALCIUM 10.0 01/21/2018   GFRNONAA 55 (L) 10/16/2017   GFRAA >60 10/16/2017   Lab Results  Component Value Date   ESRSEDRATE 31 (H) 04/08/2018   Lab Results  Component Value Date   CRP 12.6 (H) 04/08/2018      Assessment and Plan  pji with group b strep = will check labs to day to see if need to extend cephalexin. Her inflammatory markers are nearly normalized. Plan to extend oral abtx for additional 3 months  hiv disease = well controlled. Plan on continuing her current regimen  ckd 3 = dose adjust abtx  rtc in 3 months

## 2018-04-09 LAB — BASIC METABOLIC PANEL
BUN / CREAT RATIO: 12 (calc) (ref 6–22)
BUN: 17 mg/dL (ref 7–25)
CO2: 27 mmol/L (ref 20–32)
CREATININE: 1.43 mg/dL — AB (ref 0.50–0.99)
Calcium: 9.7 mg/dL (ref 8.6–10.4)
Chloride: 108 mmol/L (ref 98–110)
Glucose, Bld: 116 mg/dL — ABNORMAL HIGH (ref 65–99)
POTASSIUM: 4 mmol/L (ref 3.5–5.3)
Sodium: 142 mmol/L (ref 135–146)

## 2018-04-09 LAB — CBC WITH DIFFERENTIAL/PLATELET
BASOS PCT: 0.5 %
Basophils Absolute: 22 cells/uL (ref 0–200)
EOS ABS: 260 {cells}/uL (ref 15–500)
Eosinophils Relative: 5.9 %
HEMATOCRIT: 40.9 % (ref 35.0–45.0)
Hemoglobin: 14.2 g/dL (ref 11.7–15.5)
LYMPHS ABS: 1720 {cells}/uL (ref 850–3900)
MCH: 31.6 pg (ref 27.0–33.0)
MCHC: 34.7 g/dL (ref 32.0–36.0)
MCV: 90.9 fL (ref 80.0–100.0)
MPV: 13.1 fL — ABNORMAL HIGH (ref 7.5–12.5)
Monocytes Relative: 7.7 %
Neutro Abs: 2059 cells/uL (ref 1500–7800)
Neutrophils Relative %: 46.8 %
Platelets: 182 10*3/uL (ref 140–400)
RBC: 4.5 10*6/uL (ref 3.80–5.10)
RDW: 14.8 % (ref 11.0–15.0)
Total Lymphocyte: 39.1 %
WBC: 4.4 10*3/uL (ref 3.8–10.8)
WBCMIX: 339 {cells}/uL (ref 200–950)

## 2018-04-09 LAB — SEDIMENTATION RATE: Sed Rate: 31 mm/h — ABNORMAL HIGH (ref 0–30)

## 2018-04-09 LAB — C-REACTIVE PROTEIN: CRP: 12.6 mg/L — AB (ref <8.0)

## 2018-04-09 MED ORDER — CEPHALEXIN 500 MG PO TABS: 500.0000 mg | ORAL_TABLET | Freq: Three times a day (TID) | ORAL | 2 refills | Status: DC

## 2018-05-26 ENCOUNTER — Ambulatory Visit (INDEPENDENT_AMBULATORY_CARE_PROVIDER_SITE_OTHER): Payer: Medicare Other

## 2018-05-26 ENCOUNTER — Encounter (INDEPENDENT_AMBULATORY_CARE_PROVIDER_SITE_OTHER): Payer: Self-pay | Admitting: Physician Assistant

## 2018-05-26 ENCOUNTER — Ambulatory Visit (INDEPENDENT_AMBULATORY_CARE_PROVIDER_SITE_OTHER): Payer: Medicare Other | Admitting: Physician Assistant

## 2018-05-26 DIAGNOSIS — M25561 Pain in right knee: Secondary | ICD-10-CM

## 2018-05-26 MED ORDER — LIDOCAINE HCL 1 % IJ SOLN
0.5000 mL | INTRAMUSCULAR | Status: AC | PRN
Start: 2018-05-26 — End: 2018-05-26
  Administered 2018-05-26: .5 mL

## 2018-05-26 MED ORDER — LIDOCAINE HCL 1 % IJ SOLN
3.0000 mL | INTRAMUSCULAR | Status: AC | PRN
Start: 2018-05-26 — End: 2018-05-26
  Administered 2018-05-26: 3 mL

## 2018-05-26 MED ORDER — METHYLPREDNISOLONE ACETATE 40 MG/ML IJ SUSP
40.0000 mg | INTRAMUSCULAR | Status: AC | PRN
  Administered 2018-05-26: 40 mg via INTRA_ARTICULAR

## 2018-05-26 NOTE — Progress Notes (Signed)
Office Visit Note   Patient: Ashley Shepherd           Date of Birth: 1956-02-27           MRN: [id number] Visit Date: 05/26/2018              Requested by: Medicine, [facility] No address on file PCP: Medicine, [facility]   Assessment & Plan: Visit Diagnoses:  1. Right knee pain, unspecified chronicity     Plan: She will follow-up at her regular scheduled appointment about a month with Dr. Ninfa Linden for her left hip.  At that time we will discuss her response to the cortisone injection in the right knee.  Did show her  some quad strengthening exercises, which she is to perform on her own at home.  Follow-Up Instructions: Return in about 1 month (around 06/23/2018).   Orders:  Orders Placed This Encounter  Procedures  . Large Joint Inj: R knee  . XR Knee 1-2 Views Right   No orders of the defined types were placed in this encounter.     Procedures: Large Joint Inj: R knee on 05/26/2018 1:44 PM Indications: pain Details: 22 G 1.5 in needle, anterolateral approach  Arthrogram: No  Medications: 0.5 mL lidocaine 1 %; 3 mL lidocaine 1 %; 40 mg methylPREDNISolone acetate 40 MG/ML Outcome: tolerated well, no immediate complications Procedure, treatment alternatives, risks and benefits explained, specific risks discussed. Consent was given by the patient. Immediately prior to procedure a time out was called to verify the correct patient, procedure, equipment, support staff and site/side marked as required. Patient was prepped and draped in the usual sterile fashion.       Clinical Data: No additional findings.   Subjective: Chief Complaint  Patient presents with  . Right Knee - Pain    HPI Ashley Shepherd is well-known to Dr. Ninfa Linden service comes in today due to right knee pain and swelling states this been ongoing for the past 5 to 6 months.  She states she feels like the patella is popping out of place when she is weightbearing.  She also  feels the knee is with weak.  She was wearing an open patella knee sleeve that seems to help some.  She ambulates with a crutch due to the knee pain.  Is taking Tylenol arthritis.  No known injury to the right knee. Review of Systems No fevers chills shortness of breath chest pain  Objective: Vital Signs: There were no vitals taken for this visit.  Physical Exam  Constitutional: She is oriented to person, place, and time. She appears well-developed and well-nourished. No distress.  Pulmonary/Chest: Effort normal.  Neurological: She is alert and oriented to person, place, and time.  Skin: She is not diaphoretic.  Psychiatric: She has a normal mood and affect.    Ortho Exam Right knee no effusion abnormal warmth erythema.  She has good range of motion of the right knee.  No instability valgus varus stressing.  Tenderness peripatellar region.  Able to do a straight leg raise on the right. Specialty Comments:  No specialty comments available.  Imaging: Xr Knee 1-2 Views Right  Result Date: 05/26/2018 Right knee AP and lateral views: No acute fracture.  Mild medial lateral compartmental narrowing.  Knee is well located.    PMFS History: Patient Active Problem List   Diagnosis Date Noted  . Surgery, elective   . Abscess of left hip   . Klebsiella sepsis ([place])  07/27/2017  . Pyelonephritis 07/23/2017  . Depression with anxiety 07/23/2017  . Acute pyelonephritis   . Encounter for imaging study to confirm nasogastric (NG) tube placement   . History of left hip replacement 04/06/2016  . HIV disease ([place])   . CKD (chronic kidney disease) stage 3, GFR 30-59 ml/min (HCC)   . 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[place]) 01/24/2016  . Avascular necrosis of bone of left hip ([place]) 10/01/2014  . 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 FB GI (foreign body in gastrointestinal tract) 04/09/2014   Past Medical History:    Diagnosis Date  . Anemia   . Anxiety   . Arthritis   . 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[place])    left  . Cancer ([place])    7 years ago, cancer free now  . Depression   . Difficulty sleeping   . GERD (gastroesophageal reflux disease)   . History of kidney Shepherd   . History of transfusion   . HIV disease ([place])   . Kidney Shepherd   . Pneumonia     Family History  Problem Relation Age of Onset  . Hypertension Father   . Diabetes Father   . Heart attack Father   . Hypertension Other   . Diabetes Other     Past Surgical History:  Procedure Laterality Date  . ANTERIOR HIP REVISION Left 04/06/2016   Procedure: REMOVAL OF TEMPORARY ANTIBIOTIC SPACER LEFT HIP, LEFT TOTAL HIP REVISION;  Surgeon: Mcarthur Rossetti, MD;  Location: [facility];  Service: Orthopedics;  Laterality: Left;  . ANTERIOR HIP REVISION Left 08/02/2017   Procedure: Irrigation and debridement left hip with antibiotic bead placement;  Surgeon: Mcarthur Rossetti, MD;  Location: [facility];  Service: Orthopedics;  Laterality: Left;  . APPENDECTOMY    . COLON SURGERY  2008  . COLOSTOMY REVERSAL  2013  . EXCISIONAL TOTAL HIP ARTHROPLASTY WITH ANTIBIOTIC SPACERS Left 01/24/2016   Procedure: EXCISION OF LEFT TOTAL HIP ARTHROPLASTY WITH PLACEMENT OF ANTIBIOTIC SPACERS;  Surgeon: Mcarthur Rossetti, MD;  Location: [facility];  Service: Orthopedics;  Laterality: Left;  . INCISION AND DRAINAGE HIP Left 01/24/2016   Procedure: IRRIGATION AND DEBRIDEMENT LEFT HIP;  Surgeon: Mcarthur Rossetti, MD;  Location: [facility];  Service: Orthopedics;  Laterality: Left;  . KNEE ARTHROSCOPY     left  . PORT-A-CATH REMOVAL    . TOTAL HIP ARTHROPLASTY Left 10/01/2014   Procedure: LEFT TOTAL HIP ARTHROPLASTY ANTERIOR APPROACH;  Surgeon: Mcarthur Rossetti, MD;  [facility];  Service: Orthopedics;  Laterality: Left;   Social History   Occupational History  . Not on file  Tobacco Use  . Smoking status: Current Every Day Smoker     Packs/day: 0.50    Types: Cigarettes  . Smokeless tobacco: Never Used  . Tobacco comment: Using 1-800 quit ;uses patch  Substance and Sexual Activity  . Alcohol use: No  . Drug use: No  . Sexual activity: Not on file

## 2018-07-07 ENCOUNTER — Ambulatory Visit (INDEPENDENT_AMBULATORY_CARE_PROVIDER_SITE_OTHER): Payer: Medicare Other | Admitting: Orthopaedic Surgery

## 2018-07-08 ENCOUNTER — Other Ambulatory Visit: Payer: Self-pay | Admitting: Internal Medicine

## 2018-07-14 ENCOUNTER — Ambulatory Visit: Payer: Medicare Other | Admitting: Internal Medicine

## 2018-08-28 ENCOUNTER — Other Ambulatory Visit: Payer: Self-pay | Admitting: Infectious Diseases

## 2018-08-28 DIAGNOSIS — B373 Candidiasis of vulva and vagina: Secondary | ICD-10-CM

## 2018-08-28 DIAGNOSIS — B3731 Acute candidiasis of vulva and vagina: Secondary | ICD-10-CM

## 2018-10-24 ENCOUNTER — Telehealth: Payer: Self-pay | Admitting: *Deleted

## 2018-10-24 NOTE — Telephone Encounter (Signed)
Patient overdue for appointment with Dr Baxter Flattery. RN called to offer her an appointment. 1st call disconnected, 2nd call went to voicemail.  RN left message asking her to call back and schedule at her convenience. Landis Gandy, RN

## 2019-01-21 ENCOUNTER — Encounter (HOSPITAL_COMMUNITY): Payer: Self-pay | Admitting: Orthopaedic Surgery

## 2019-01-26 ENCOUNTER — Encounter (HOSPITAL_BASED_OUTPATIENT_CLINIC_OR_DEPARTMENT_OTHER): Payer: Self-pay

## 2019-01-26 ENCOUNTER — Other Ambulatory Visit: Payer: Self-pay

## 2019-01-26 ENCOUNTER — Emergency Department (HOSPITAL_BASED_OUTPATIENT_CLINIC_OR_DEPARTMENT_OTHER): Payer: Medicare Other

## 2019-01-26 ENCOUNTER — Emergency Department (HOSPITAL_BASED_OUTPATIENT_CLINIC_OR_DEPARTMENT_OTHER)
Admission: EM | Admit: 2019-01-26 | Discharge: 2019-01-26 | Disposition: A | Discharge status: Home / Self Care | Payer: Medicare Other | Attending: Emergency Medicine | Admitting: Emergency Medicine

## 2019-01-26 DIAGNOSIS — J111 Influenza due to unidentified influenza virus with other respiratory manifestations: Secondary | ICD-10-CM

## 2019-01-26 DIAGNOSIS — R05 Cough: Secondary | ICD-10-CM | POA: Diagnosis present

## 2019-01-26 DIAGNOSIS — F329 Major depressive disorder, single episode, unspecified: Secondary | ICD-10-CM | POA: Diagnosis not present

## 2019-01-26 DIAGNOSIS — F17210 Nicotine dependence, cigarettes, uncomplicated: Secondary | ICD-10-CM | POA: Diagnosis not present

## 2019-01-26 DIAGNOSIS — Z79899 Other long term (current) drug therapy: Secondary | ICD-10-CM | POA: Insufficient documentation

## 2019-01-26 DIAGNOSIS — F419 Anxiety disorder, unspecified: Secondary | ICD-10-CM | POA: Insufficient documentation

## 2019-01-26 DIAGNOSIS — B20 Human immunodeficiency virus [HIV] disease: Secondary | ICD-10-CM | POA: Insufficient documentation

## 2019-01-26 DIAGNOSIS — Z96642 Presence of left artificial hip joint: Secondary | ICD-10-CM | POA: Insufficient documentation

## 2019-01-26 DIAGNOSIS — N183 Chronic kidney disease, stage 3 (moderate): Secondary | ICD-10-CM | POA: Insufficient documentation

## 2019-01-26 DIAGNOSIS — Z7982 Long term (current) use of aspirin: Secondary | ICD-10-CM | POA: Diagnosis not present

## 2019-01-26 DIAGNOSIS — Z859 Personal history of malignant neoplasm, unspecified: Secondary | ICD-10-CM | POA: Diagnosis not present

## 2019-01-26 DIAGNOSIS — R69 Illness, unspecified: Secondary | ICD-10-CM

## 2019-01-26 MED ORDER — KETOROLAC TROMETHAMINE 15 MG/ML IJ SOLN
15.0000 mg | Freq: Once | INTRAMUSCULAR | Status: AC
  Administered 2019-01-26: 15 mg via INTRAMUSCULAR
  Filled 2019-01-26: qty 1

## 2019-01-26 MED ORDER — OSELTAMIVIR PHOSPHATE 75 MG PO CAPS: 75.0000 mg | ORAL_CAPSULE | Freq: Two times a day (BID) | ORAL | 0 refills | Status: DC

## 2019-01-26 MED ORDER — IBUPROFEN 600 MG PO TABS: 600.0000 mg | ORAL_TABLET | Freq: Three times a day (TID) | ORAL | 0 refills | Status: DC | PRN

## 2019-01-26 NOTE — ED Triage Notes (Signed)
C/o flu like sx day 2-NAD-steady gait

## 2019-01-26 NOTE — ED Provider Notes (Signed)
[facility] EMERGENCY DEPARTMENT Provider Note   CSN: [id number] Arrival date & time: 01/26/19  1212     History   Chief Complaint Chief Complaint  Patient presents with  . Cough    HPI Ashley Shepherd is a 63 y.o. female.  The history is provided by the patient and medical records. No language interpreter was used.  Cough  Associated symptoms: myalgias and sore throat   Associated symptoms: no chest pain and no shortness of breath    Ashley Shepherd is a 63 y.o. female  with a PMH as listed below including HIV who presents to the Emergency Department complaining of acute onset of generalized body aches, cough and sore throat that began yesterday.  She denies having a temperature, but did check and it was 99.3.  She denies any chest pain or trouble breathing.  She has come to the hospital multiple times with her husband who was diagnosed with pneumonia.  He has not tested positive for the flu.  She did not get her flu shot.  Past Medical History:  Diagnosis Date  . Anemia   . Anxiety   . Arthritis   . 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[place])    left  . Cancer ([place])    7 years ago, cancer free now  . Depression   . Difficulty sleeping   . GERD (gastroesophageal reflux disease)   . History of kidney stones   . History of transfusion   . HIV disease ([place])   . Kidney stones   . Pneumonia     Patient Active Problem List   Diagnosis Date Noted  . Surgery, elective   . Abscess of left hip   . Klebsiella sepsis ([place]) 07/27/2017  . Pyelonephritis 07/23/2017  . Depression with anxiety 07/23/2017  . Acute pyelonephritis   . Encounter for imaging study to confirm nasogastric (NG) tube placement   . History of left hip replacement 04/06/2016  . HIV disease ([place])   . CKD (chronic kidney disease) stage 3, GFR 30-59 ml/min (HCC)   . 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[place]) 01/24/2016  . Avascular necrosis of bone of  left hip ([place]) 10/01/2014  . 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 FB GI (foreign body in gastrointestinal tract) 04/09/2014    Past Surgical History:  Procedure Laterality Date  . ANTERIOR HIP REVISION Left 04/06/2016   Procedure: REMOVAL OF TEMPORARY ANTIBIOTIC SPACER LEFT HIP, LEFT TOTAL HIP REVISION;  Surgeon: Mcarthur Rossetti, MD;  [facility];  Service: Orthopedics;  Laterality: Left;  . ANTERIOR HIP REVISION Left 08/02/2017   Procedure: Irrigation and debridement left hip with antibiotic bead placement;  Surgeon: Mcarthur Rossetti, MD;  [facility];  Service: Orthopedics;  Laterality: Left;  . APPENDECTOMY    . COLON SURGERY  2008  . COLOSTOMY REVERSAL  2013  . EXCISIONAL TOTAL HIP ARTHROPLASTY WITH ANTIBIOTIC SPACERS Left 01/24/2016   Procedure: EXCISION OF LEFT TOTAL HIP ARTHROPLASTY WITH PLACEMENT OF ANTIBIOTIC SPACERS;  Surgeon: Mcarthur Rossetti, MD;  Location: [facility];  Service: Orthopedics;  Laterality: Left;  . INCISION AND DRAINAGE HIP Left 01/24/2016   Procedure: IRRIGATION AND DEBRIDEMENT LEFT HIP;  Surgeon: Mcarthur Rossetti, MD;  Location: [facility];  Service: Orthopedics;  Laterality: Left;  . KNEE ARTHROSCOPY     left  . PORT-A-CATH REMOVAL    . TOTAL HIP ARTHROPLASTY Left 10/01/2014  Procedure: LEFT TOTAL HIP ARTHROPLASTY ANTERIOR APPROACH;  Surgeon: Mcarthur Rossetti, MD;  [facility];  Service: Orthopedics;  Laterality: Left;     OB History   No obstetric history on file.      Home Medications    Prior to Admission medications   Medication Sig Start Date End Date Taking? Authorizing Provider  ALPRAZolam (XANAX) 0.5 MG tablet TAKE ONE TABLET (0.5 MG TOTAL) BY MOUTH AT BEDTIME AS NEEDED FOR SLEEP. 09/03/17   [provider]  ALPRAZolam Duanne Moron) 0.5 MG tablet Take by mouth. 05/23/18   [provider]  aspirin 81 MG tablet Take by mouth. 06/13/11   [provider]  aspirin EC 81 MG  tablet Take 81 mg by mouth daily.    [provider]  Calcium Carb-Cholecalciferol 1000-800 MG-UNIT TABS Take by mouth.    [provider]  Calcium Citrate-Vitamin D (CALCIUM + D PO) Take 1 tablet by mouth daily.    [provider]  Calcium Citrate-Vitamin D (CALCIUM CITRATE+D3 PETITES) 200-250 MG-UNIT TABS Take by mouth.    [provider]  ceFAZolin (ANCEF) 10 g injection  09/13/17   [provider]  cephALEXin (KEFLEX) 500 MG capsule TAKE 1 CAPSULE BY MOUTH THREE TIMES A DAY 07/08/18   Carlyle Basques, MD  Cephalexin 500 MG tablet Take by mouth. 04/09/18   [provider]  ciprofloxacin (CIPRO) 500 MG tablet Take by mouth.    [provider]  citalopram (CELEXA) 20 MG tablet Take 20 mg by mouth every morning.     [provider]  citalopram (CELEXA) 20 MG tablet TAKE 1 TABLET BY MOUTH EVERY DAY 05/23/18   [provider]  clonazePAM (KLONOPIN) 1 MG tablet Take by mouth. 02/16/10   [provider]  dolutegravir (TIVICAY) 50 MG tablet Take 50 mg by mouth 2 (two) times daily.  05/02/15   [provider]  dolutegravir (TIVICAY) 50 MG tablet TAKE 1 TABLET BY MOUTH TWICE DAILY 02/03/18   [provider]  emtricitabine-tenofovir AF (DESCOVY) 200-25 MG tablet TAKE ONE TABLET BY MOUTH DAILY. 12/09/17   [provider]  ferrous sulfate 325 (65 FE) MG tablet Take 325 mg by mouth 2 (two) times daily.     [provider]  fluconazole (DIFLUCAN) 150 MG tablet TAKE 1 TABLET BY MOUTH ONCE. MAY REPEAT 2 TIMES IF NECESSARY 04/05/18   [provider]  maraviroc (SELZENTRY) 300 MG tablet Take 300 mg by mouth 2 (two) times daily. Reported on 01/24/2016    [provider]  maraviroc (SELZENTRY) 300 MG tablet TAKE 1 TABLET BY MOUTH TWICE DAILY 03/24/18   [provider]  Multiple Vitamin (MULTIVITAMIN WITH MINERALS) TABS tablet Take 1 tablet by mouth daily.    [provider]  Multiple Vitamins-Minerals (MULTIVITAMIN WITH MINERALS) tablet Take by mouth.    [provider]  nabumetone (RELAFEN) 750 MG tablet Take by mouth. 06/13/11   [provider]  ondansetron (ZOFRAN) 4 MG tablet Take by mouth. 07/03/11   [provider]  oxyCODONE-acetaminophen (PERCOCET/ROXICET) 5-325 MG tablet Take by mouth. 07/27/11   [provider]  promethazine (PHENERGAN) 12.5 MG tablet Take by mouth.    [provider]  raltegravir (ISENTRESS) 400 MG tablet Take by mouth. 06/13/11   [provider]  ritonavir (NORVIR) 100 MG capsule Take by mouth. 06/13/11   [provider]  tenofovir (VIREAD) 300 MG tablet Take by mouth. 02/16/10   [provider]  Vitamins/Minerals TABS Take by mouth.    [provider]    Family History Family History  Problem Relation Age of Onset  . Hypertension Father   . Diabetes Father   . Heart attack Father   . Hypertension Other   . Diabetes Other     Social History Social History   Tobacco Use  . Smoking status: Current Every Day Smoker    Packs/day: 0.50    Types: Cigarettes  . Smokeless tobacco: Never Used  . Tobacco comment: Using 1-800 quit ;uses patch  Substance Use Topics  . Alcohol use: No  . Drug use: No     Allergies   Iodine; Other; Ivp dye [iodinated diagnostic agents]; Sulfa antibiotics; and Codeine   Review of Systems Review of Systems  HENT: Positive for congestion and sore throat.   Respiratory: Positive for cough. Negative for shortness of breath.   Cardiovascular: Negative for chest pain.  Musculoskeletal: Positive for myalgias.  All other systems reviewed and are negative.    Physical Exam Updated Vital Signs BP (!) 141/71 (BP Location: Left Arm)   Pulse 89   Temp 99.1 F (37.3 C) (Oral)   Resp 20   Ht 5\' 10"  (1.778 m)   Wt 91.2 kg   SpO2 98%   BMI 28.84 kg/m   Physical Exam Vitals signs and nursing note  reviewed.  Constitutional:      General: She is not in acute distress.    Appearance: She is well-developed.  HENT:     Head: Normocephalic and atraumatic.     Mouth/Throat:     Comments: No tonsillar hypertrophy or exudates. Neck:     Musculoskeletal: Neck supple.  Cardiovascular:     Rate and Rhythm: Normal rate and regular rhythm.     Heart sounds: Normal heart sounds. No murmur.  Pulmonary:     Effort: Pulmonary effort is normal. No respiratory distress.     Breath sounds: Normal breath sounds.     Comments: Lungs are clear to auscultation bilaterally. Abdominal:     General: There is no distension.     Palpations: Abdomen is soft.     Tenderness: There is no abdominal tenderness.     Comments: No abdominal tenderness.  Skin:    General: Skin is warm and dry.  Neurological:     Mental Status: She is alert and oriented to person, place, and time.      ED Treatments / Results  Labs (all labs ordered are listed, but only abnormal results are displayed) Labs Reviewed - No data to display  EKG None  Radiology Dg Chest 2 View  Result Date: 01/26/2019 CLINICAL DATA:  Chest pain with cough and chills EXAM: CHEST - 2 VIEW COMPARISON:  July 23, 2017 and November 28, 2013 FINDINGS: There is stable asymmetric apical pleural thickening on the right. There is no edema or consolidation. The heart size and pulmonary vascularity are normal. No adenopathy. There is mild degenerative change in the thoracic spine. IMPRESSION: Stable asymmetric pleural thickening on the right. The lungs elsewhere are clear. Heart size normal. No adenopathy evident. Electronically Signed   By: Lowella Grip III M.D.   On: 01/26/2019 13:07    Procedures Procedures (including critical care time)  Medications Ordered in ED Medications - No data to display   Initial Impression / Assessment and Plan / ED Course  I have reviewed the triage vital signs and the nursing notes.  Pertinent labs & imaging  results that  were available during my care of the patient were reviewed by me and considered in my medical decision making (see chart for details).    Ashley Shepherd is a 63 y.o. female who presents to ED for acute onset of generalized body aches, cough, congestion and sore throat yesterday.  On exam today, she has temperature of 99.1 and vital signs are stable.  Lungs are clear to auscultation bilaterally.  No signs of pneumonia on chest x-[place].  She has been in and out of the hospital with her husband who was diagnosed with pneumonia.  He did not test positive for the flu, but she certainly has had possible exposure since being in the hospital so many times recently.  She does have HIV, making her much higher risk.  We will go ahead and treat her with Tamiflu given her constellation of symptoms and immunocompromise state.  Symptomatic home care instructions discussed, primary care follow-up encouraged.  Reasons to return to the emergency department were discussed at length and all questions answered.   Final Clinical Impressions(s) / ED Diagnoses   Final diagnoses:  Influenza-like illness    ED Discharge Orders    None       Krista Godsil, Ozella Almond, PA-C 01/26/19 1325    Gareth Morgan, MD 01/30/19 2204

## 2019-01-26 NOTE — ED Notes (Signed)
Patient transported to [facility] 

## 2019-01-26 NOTE — Discharge Instructions (Signed)
It was my pleasure taking care of you today!  Alternate between Tylenol and ibuprofen as needed for body aches / fevers.  Rest, drink plenty of fluids to stay hydrated. Wash your hands often.  Follow up with your doctor in regards to your hospital visit.  Return to the emergency department for trouble breathing, if symptoms worsen, become progressive, or become more concerning.

## 2019-01-28 ENCOUNTER — Other Ambulatory Visit: Payer: Self-pay

## 2019-01-28 ENCOUNTER — Emergency Department (HOSPITAL_BASED_OUTPATIENT_CLINIC_OR_DEPARTMENT_OTHER)
Admission: EM | Admit: 2019-01-28 | Discharge: 2019-01-28 | Disposition: A | Discharge status: Home / Self Care | Payer: Medicare Other | Attending: Emergency Medicine | Admitting: Emergency Medicine

## 2019-01-28 ENCOUNTER — Encounter (HOSPITAL_BASED_OUTPATIENT_CLINIC_OR_DEPARTMENT_OTHER): Payer: Self-pay

## 2019-01-28 DIAGNOSIS — Z20828 Contact with and (suspected) exposure to other viral communicable diseases: Secondary | ICD-10-CM | POA: Diagnosis not present

## 2019-01-28 DIAGNOSIS — F17210 Nicotine dependence, cigarettes, uncomplicated: Secondary | ICD-10-CM | POA: Insufficient documentation

## 2019-01-28 DIAGNOSIS — N183 Chronic kidney disease, stage 3 (moderate): Secondary | ICD-10-CM | POA: Diagnosis not present

## 2019-01-28 DIAGNOSIS — R05 Cough: Secondary | ICD-10-CM | POA: Diagnosis present

## 2019-01-28 DIAGNOSIS — Z96642 Presence of left artificial hip joint: Secondary | ICD-10-CM | POA: Insufficient documentation

## 2019-01-28 DIAGNOSIS — Z859 Personal history of malignant neoplasm, unspecified: Secondary | ICD-10-CM | POA: Insufficient documentation

## 2019-01-28 DIAGNOSIS — B20 Human immunodeficiency virus [HIV] disease: Secondary | ICD-10-CM | POA: Diagnosis not present

## 2019-01-28 DIAGNOSIS — Z79899 Other long term (current) drug therapy: Secondary | ICD-10-CM | POA: Insufficient documentation

## 2019-01-28 DIAGNOSIS — Z7982 Long term (current) use of aspirin: Secondary | ICD-10-CM | POA: Diagnosis not present

## 2019-01-28 DIAGNOSIS — J111 Influenza due to unidentified influenza virus with other respiratory manifestations: Secondary | ICD-10-CM

## 2019-01-28 DIAGNOSIS — R69 Illness, unspecified: Secondary | ICD-10-CM

## 2019-01-28 MED ORDER — BENZONATATE 200 MG PO CAPS: 200.0000 mg | ORAL_CAPSULE | Freq: Three times a day (TID) | ORAL | 0 refills | Status: AC

## 2019-01-28 MED ORDER — FLUTICASONE PROPIONATE 50 MCG/ACT NA SUSP: 1.0000 | Freq: Every day | NASAL | 2 refills | Status: DC

## 2019-01-28 MED ORDER — ALBUTEROL SULFATE HFA 108 (90 BASE) MCG/ACT IN AERS: 1.0000 | INHALATION_SPRAY | Freq: Four times a day (QID) | RESPIRATORY_TRACT | 0 refills | Status: DC | PRN

## 2019-01-28 NOTE — ED Triage Notes (Signed)
Pt states she was dx with "the flu" here and she feels no better-NAD-steady gait

## 2019-01-28 NOTE — ED Provider Notes (Signed)
[facility] EMERGENCY DEPARTMENT Provider Note   CSN: [id number] Arrival date & time: 01/28/19  1319     History   Chief Complaint Chief Complaint  Patient presents with  . Influenza    HPI Ashley Shepherd is a 63 y.o. female.  63 year old female with history of HIV (last CD4 12/2017 540, has not had recent labs, is compliant with meds) presents with ongoing flulike symptoms.  Patient states symptoms started 3 days ago, she was seen in this emergency room at that time and given Tamiflu.  Patient is exposed to her husband who is currently admitted to the hospital with flu and pneumonia.  Patient had a chest x-[facility] at her initial visit which was normal.  Patient continues to have body aches and nonproductive cough.  Patient reports coughing fits which make it difficult for her to catch her breath.  Patient is taking Motrin for her body aches, last had Motrin at noon today. Denies wheezing, SHOB. No other complaints or concerns.      Past Medical History:  Diagnosis Date  . Anemia   . Anxiety   . Arthritis   . 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[place])    left  . Cancer ([place])    7 years ago, cancer free now  . Depression   . Difficulty sleeping   . GERD (gastroesophageal reflux disease)   . History of kidney stones   . History of transfusion   . HIV disease ([place])   . Kidney stones   . Pneumonia     Patient Active Problem List   Diagnosis Date Noted  . Surgery, elective   . Abscess of left hip   . Klebsiella sepsis ([place]) 07/27/2017  . Pyelonephritis 07/23/2017  . Depression with anxiety 07/23/2017  . Acute pyelonephritis   . Encounter for imaging study to confirm nasogastric (NG) tube placement   . History of left hip replacement 04/06/2016  . HIV disease ([place])   . CKD (chronic kidney disease) stage 3, GFR 30-59 ml/min (H[facility])   . 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 Avascular necrosis of  bone of left hip ([place]) 10/01/2014  . 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 FB GI (foreign body in gastrointestinal tract) 04/09/2014    Past Surgical History:  Procedure Laterality Date  . ANTERIOR HIP REVISION Left 04/06/2016   Procedure: REMOVAL OF TEMPORARY ANTIBIOTIC SPACER LEFT HIP, LEFT TOTAL HIP REVISION;  Surgeon: Mcarthur Rossetti, MD;  Location: [facility];  Service: Orthopedics;  Laterality: Left;  . ANTERIOR HIP REVISION Left 08/02/2017   Procedure: Irrigation and debridement left hip with antibiotic bead placement;  Surgeon: Mcarthur Rossetti, MD;  Location: [facility];  Service: Orthopedics;  Laterality: Left;  . APPENDECTOMY    . COLON SURGERY  2008  . COLOSTOMY REVERSAL  2013  . EXCISIONAL TOTAL HIP ARTHROPLASTY WITH ANTIBIOTIC SPACERS Left 01/24/2016   Procedure: EXCISION OF LEFT TOTAL HIP ARTHROPLASTY WITH PLACEMENT OF ANTIBIOTIC SPACERS;  Surgeon: Mcarthur Rossetti, MD;  Location: [facility];  Service: Orthopedics;  Laterality: Left;  . INCISION AND DRAINAGE HIP Left 01/24/2016   Procedure: IRRIGATION AND DEBRIDEMENT LEFT HIP;  Surgeon: Mcarthur Rossetti, MD;  Location: [facility];  Service: Orthopedics;  Laterality: Left;  . KNEE ARTHROSCOPY     left  . PORT-A-CATH REMOVAL    . TOTAL HIP ARTHROPLASTY Left 10/01/2014   Procedure: LEFT TOTAL  HIP ARTHROPLASTY ANTERIOR APPROACH;  Surgeon: Mcarthur Rossetti, MD;  Location: [facility];  Service: Orthopedics;  Laterality: Left;     OB History   No obstetric history on file.      Home Medications    Prior to Admission medications   Medication Sig Start Date End Date Taking? Authorizing Provider  albuterol (PROVENTIL HFA;VENTOLIN HFA) 108 (90 Base) MCG/ACT inhaler Inhale 1-2 puffs into the lungs every 6 (six) hours as needed for wheezing or shortness of breath. 01/28/19   Tacy Learn, PA-C  ALPRAZolam Duanne Moron) 0.5 MG tablet TAKE ONE TABLET (0.5 MG TOTAL) BY MOUTH AT BEDTIME AS NEEDED FOR SLEEP. 09/03/17    [provider]  ALPRAZolam Duanne Moron) 0.5 MG tablet Take by mouth. 05/23/18   [provider]  aspirin 81 MG tablet Take by mouth. 06/13/11   [provider]  aspirin EC 81 MG tablet Take 81 mg by mouth daily.    [provider]  benzonatate (TESSALON) 200 MG capsule Take 1 capsule (200 mg total) by mouth every 8 (eight) hours for 10 days. 01/28/19 02/07/19  Suella Broad A, PA-C  Calcium Carb-Cholecalciferol 1000-800 MG-UNIT TABS Take by mouth.    [provider]  Calcium Citrate-Vitamin D (CALCIUM + D PO) Take 1 tablet by mouth daily.    [provider]  Calcium Citrate-Vitamin D (CALCIUM CITRATE+D3 PETITES) 200-250 MG-UNIT TABS Take by mouth.    [provider]  ceFAZolin (ANCEF) 10 g injection  09/13/17   [provider]  cephALEXin (KEFLEX) 500 MG capsule TAKE 1 CAPSULE BY MOUTH THREE TIMES A DAY 07/08/18   Carlyle Basques, MD  Cephalexin 500 MG tablet Take by mouth. 04/09/18   [provider]  ciprofloxacin (CIPRO) 500 MG tablet Take by mouth.    [provider]  citalopram (CELEXA) 20 MG tablet Take 20 mg by mouth every morning.     [provider]  citalopram (CELEXA) 20 MG tablet TAKE 1 TABLET BY MOUTH EVERY DAY 05/23/18   [provider]  clonazePAM (KLONOPIN) 1 MG tablet Take by mouth. 02/16/10   [provider]  dolutegravir (TIVICAY) 50 MG tablet Take 50 mg by mouth 2 (two) times daily.  05/02/15   [provider]  dolutegravir (TIVICAY) 50 MG tablet TAKE 1 TABLET BY MOUTH TWICE DAILY 02/03/18   [provider]  emtricitabine-tenofovir AF (DESCOVY) 200-25 MG tablet TAKE ONE TABLET BY MOUTH DAILY. 12/09/17   [provider]  ferrous sulfate 325 (65 FE) MG tablet Take 325 mg by mouth 2 (two) times daily.     [provider]  fluconazole (DIFLUCAN) 150 MG tablet TAKE 1 TABLET BY MOUTH ONCE. MAY REPEAT 2 TIMES IF NECESSARY 04/05/18   [provider]  fluticasone (FLONASE) 50 MCG/ACT nasal spray Place 1 spray into both nostrils daily. 01/28/19   Tacy Learn, PA-C  ibuprofen (ADVIL,MOTRIN) 600 MG tablet Take 1 tablet (600 mg total) by mouth every 8 (eight) hours as needed (Body aches, fever). 01/26/19   Ward, Ozella Almond, PA-C  maraviroc (SELZENTRY) 300 MG tablet Take 300 mg by mouth 2 (two) times daily. Reported on 01/24/2016    [provider]  maraviroc (SELZENTRY) 300 MG tablet TAKE 1 TABLET BY MOUTH TWICE DAILY 03/24/18   [provider]  Multiple Vitamin (MULTIVITAMIN WITH MINERALS) TABS tablet Take 1 tablet by mouth daily.    [provider]  Multiple Vitamins-Minerals (MULTIVITAMIN WITH MINERALS) tablet Take by mouth.  [provider]  nabumetone (RELAFEN) 750 MG tablet Take by mouth. 06/13/11   [provider]  ondansetron (ZOFRAN) 4 MG tablet Take by mouth. 07/03/11   [provider]  oseltamivir (TAMIFLU) 75 MG capsule Take 1 capsule (75 mg total) by mouth every 12 (twelve) hours. 01/26/19   Ward, Ozella Almond, PA-C  oxyCODONE-acetaminophen (PERCOCET/ROXICET) 5-325 MG tablet Take by mouth. 07/27/11   [provider]  promethazine (PHENERGAN) 12.5 MG tablet Take by mouth.    [provider]  raltegravir (ISENTRESS) 400 MG tablet Take by mouth. 06/13/11   [provider]  ritonavir (NORVIR) 100 MG capsule Take by mouth. 06/13/11   [provider]  tenofovir (VIREAD) 300 MG tablet Take by mouth. 02/16/10   [provider]  Vitamins/Minerals TABS Take by mouth.    [provider]    Family History Family History  Problem Relation Age of Onset  . Hypertension Father   . Diabetes Father   . Heart attack Father   . Hypertension Other   . Diabetes Other     Social History Social History   Tobacco Use  . Smoking status: Current Every Day Smoker    Packs/day: 0.50    Types: Cigarettes  . Smokeless tobacco: Never  Used  Substance Use Topics  . Alcohol use: No  . Drug use: No     Allergies   Iodine; Other; Ivp dye [iodinated diagnostic agents]; Sulfa antibiotics; and Codeine   Review of Systems Review of Systems  Constitutional: Positive for fever.  HENT: Positive for congestion. Negative for ear pain, sinus pressure, sinus pain, sneezing and sore throat.   Respiratory: Positive for cough. Negative for shortness of breath and wheezing.   Cardiovascular: Negative for chest pain.  Gastrointestinal: Negative for nausea and vomiting.  Musculoskeletal: Positive for arthralgias and myalgias.  Skin: Negative for rash and wound.  Allergic/Immunologic: Positive for immunocompromised state.  Neurological: Negative for dizziness, weakness and headaches.  Hematological: Negative for adenopathy.  Psychiatric/Behavioral: Negative for confusion.  All other systems reviewed and are negative.    Physical Exam Updated Vital Signs BP 124/80 (BP Location: Left Arm)   Pulse 86   Temp 100 F (37.8 C) (Oral)   Resp 18   Ht 5\' 10"  (1.778 m)   Wt 85.2 kg   SpO2 98%   BMI 26.95 kg/m   Physical Exam Vitals signs and nursing note reviewed.  Constitutional:      General: She is not in acute distress.    Appearance: She is well-developed. She is not diaphoretic.  HENT:     Head: Normocephalic and atraumatic.     Right Ear: Tympanic membrane and ear canal normal.     Left Ear: Tympanic membrane and ear canal normal.     Nose: Congestion present. No rhinorrhea.     Mouth/Throat:     Mouth: Mucous membranes are moist.     Pharynx: Posterior oropharyngeal erythema present. No oropharyngeal exudate.  Eyes:     Conjunctiva/sclera: Conjunctivae normal.  Neck:     Musculoskeletal: Neck supple.  Cardiovascular:     Rate and Rhythm: Normal rate and regular rhythm.     Pulses: Normal pulses.     Heart sounds: Normal heart sounds.  Pulmonary:     Effort: Pulmonary effort is normal.     Breath sounds:  Normal breath sounds.  Lymphadenopathy:     Cervical: No cervical adenopathy.  Skin:    General: Skin is warm and dry.  Capillary Refill: Capillary refill takes less than 2 seconds.     Findings: No erythema or rash.  Neurological:     Mental Status: She is alert and oriented to person, place, and time.  Psychiatric:        Behavior: Behavior normal.      ED Treatments / Results  Labs (all labs ordered are listed, but only abnormal results are displayed) Labs Reviewed - No data to display  EKG None  Radiology No results found.  Procedures Procedures (including critical care time)  Medications Ordered in ED Medications - No data to display   Initial Impression / Assessment and Plan / ED Course  I have reviewed the triage vital signs and the nursing notes.  Pertinent labs & imaging results that were available during my care of the patient were reviewed by me and considered in my medical decision making (see chart for details).  Clinical Course as of Jan 28 1527  Wed Jan 28, 2019  1522 63yo female with hx of HIV, last CD4 540 12/2017, compliant with meds, exposed to the flu with symptom onset 3 days ago, seen here 2 days ago and given Tamiflu. Patient was concerned due to her husband (who is admitted with pna and flu) running a fever of 105 yesterday which prompted her to come to the ER today. Patient feels like she should be feeling better as she has been taking Tamiflu x 2 days. Vitals stable with temp 100, lungs clear, patient well appearing. CXR 2 days ago normal. Recommend Tessalon for cough, albuterol inhaler (describes cough fits possibly bronchospasm), Flonase and saline sinus rinse. Recheck with PCP in 1-2 days, return to ER as needed.   [LM]    Clinical Course User Index [LM] Tacy Learn, PA-C   Final Clinical Impressions(s) / ED Diagnoses   Final diagnoses:  Influenza-like illness    ED Discharge Orders         Ordered    benzonatate (TESSALON) 200  MG capsule  Every 8 hours     01/28/19 1515    fluticasone (FLONASE) 50 MCG/ACT nasal spray  Daily     01/28/19 1515    albuterol (PROVENTIL HFA;VENTOLIN HFA) 108 (90 Base) MCG/ACT inhaler  Every 6 hours PRN     01/28/19 1515           Roque Lias 01/28/19 1529    Drenda Freeze, MD 01/28/19 1530

## 2019-01-28 NOTE — ED Notes (Signed)
Pt back to ED, was seen 2 days ago and Dx with flu, sts hes symptoms did not get better.

## 2019-01-28 NOTE — Discharge Instructions (Addendum)
Follow up with your doctor in 1-2 days. Saline sinus rinse twice daily. Flonase daily. Tessalon as needed as prescribed for cough. Motrin/Tylenol as needed as directed for body aches. Finish Tamiflu as prescribed.

## 2019-02-17 ENCOUNTER — Other Ambulatory Visit: Payer: Self-pay

## 2019-02-17 ENCOUNTER — Emergency Department (HOSPITAL_BASED_OUTPATIENT_CLINIC_OR_DEPARTMENT_OTHER)
Admission: EM | Admit: 2019-02-17 | Discharge: 2019-02-17 | Disposition: A | Discharge status: Home / Self Care | Payer: Medicare Other | Attending: Emergency Medicine | Admitting: Emergency Medicine

## 2019-02-17 ENCOUNTER — Encounter (HOSPITAL_BASED_OUTPATIENT_CLINIC_OR_DEPARTMENT_OTHER): Payer: Self-pay | Admitting: Emergency Medicine

## 2019-02-17 DIAGNOSIS — Z7982 Long term (current) use of aspirin: Secondary | ICD-10-CM | POA: Diagnosis not present

## 2019-02-17 DIAGNOSIS — Z21 Asymptomatic human immunodeficiency virus [HIV] infection status: Secondary | ICD-10-CM | POA: Diagnosis not present

## 2019-02-17 DIAGNOSIS — F17210 Nicotine dependence, cigarettes, uncomplicated: Secondary | ICD-10-CM | POA: Diagnosis not present

## 2019-02-17 DIAGNOSIS — Z96642 Presence of left artificial hip joint: Secondary | ICD-10-CM | POA: Diagnosis not present

## 2019-02-17 DIAGNOSIS — Z79899 Other long term (current) drug therapy: Secondary | ICD-10-CM | POA: Insufficient documentation

## 2019-02-17 DIAGNOSIS — N390 Urinary tract infection, site not specified: Secondary | ICD-10-CM | POA: Diagnosis not present

## 2019-02-17 DIAGNOSIS — R509 Fever, unspecified: Secondary | ICD-10-CM | POA: Diagnosis present

## 2019-02-17 DIAGNOSIS — N183 Chronic kidney disease, stage 3 (moderate): Secondary | ICD-10-CM | POA: Insufficient documentation

## 2019-02-17 LAB — URINALYSIS, ROUTINE W REFLEX MICROSCOPIC
Bilirubin Urine: NEGATIVE
Glucose, UA: NEGATIVE mg/dL
Ketones, ur: NEGATIVE mg/dL
Nitrite: POSITIVE — AB
Protein, ur: NEGATIVE mg/dL
Specific Gravity, Urine: 1.01 (ref 1.005–1.030)
pH: 6 (ref 5.0–8.0)

## 2019-02-17 LAB — URINALYSIS, MICROSCOPIC (REFLEX)

## 2019-02-17 MED ORDER — IBUPROFEN 400 MG PO TABS
600.0000 mg | ORAL_TABLET | Freq: Once | ORAL | Status: AC
  Administered 2019-02-17: 600 mg via ORAL
  Filled 2019-02-17: qty 1

## 2019-02-17 MED ORDER — PHENAZOPYRIDINE HCL 200 MG PO TABS: 200.0000 mg | ORAL_TABLET | Freq: Three times a day (TID) | ORAL | 0 refills | Status: DC

## 2019-02-17 MED ORDER — CEPHALEXIN 500 MG PO CAPS: 500.0000 mg | ORAL_CAPSULE | Freq: Four times a day (QID) | ORAL | 0 refills | Status: DC

## 2019-02-17 MED ORDER — CEPHALEXIN 250 MG PO CAPS
500.0000 mg | ORAL_CAPSULE | Freq: Once | ORAL | Status: AC
  Administered 2019-02-17: 500 mg via ORAL
  Filled 2019-02-17: qty 2

## 2019-02-17 MED ORDER — PHENAZOPYRIDINE HCL 100 MG PO TABS
200.0000 mg | ORAL_TABLET | Freq: Once | ORAL | Status: AC
  Administered 2019-02-17: 200 mg via ORAL
  Filled 2019-02-17: qty 2

## 2019-02-17 NOTE — ED Triage Notes (Signed)
Pt states she has been running a fever since 3pm Monday and has been having lower back pain since Saturday  Pt thinks she may have a UTI

## 2019-02-17 NOTE — ED Provider Notes (Signed)
[facility] EMERGENCY DEPARTMENT Provider Note   CSN: [id number] Arrival date & time: 02/17/19  0157    History   Chief Complaint Chief Complaint  Patient presents with  . Fever  . Back Pain    HPI Ashley Shepherd is a 63 y.o. female.     The history is provided by the patient.  Fever  Max temp prior to arrival:  102 Severity:  Mild Onset quality:  Gradual Timing:  Rare Progression:  Resolved Chronicity:  New Relieved by:  Acetaminophen Worsened by:  Nothing Associated symptoms: dysuria   Associated symptoms: no chest pain, no cough, no headaches, no sore throat and no vomiting   Associated symptoms comment:  Frequency and hesitancy  Dysuria:    Severity:  Moderate   Onset quality:  Gradual   Duration:  4 days   Timing:  Constant   Progression:  Unchanged   Chronicity:  Recurrent Risk factors: no recent sickness   Patient with a h/o HIV and UTI presents with dysuria since Saturday and a fever this afternoon that resolved with a dose of tylenol.  No flank pain.  But does have urgency and hesitancy.  No cough, no sore throat, no HA, no neck pain, no nausea no vomiting.    Past Medical History:  Diagnosis Date  . Anemia   . Anxiety   . Arthritis   . 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[place])    left  . Cancer ([place])    7 years ago, cancer free now  . Depression   . Difficulty sleeping   . GERD (gastroesophageal reflux disease)   . History of kidney stones   . History of transfusion   . HIV disease ([place])   . Kidney stones   . Pneumonia     Patient Active Problem List   Diagnosis Date Noted  . Surgery, elective   . Abscess of left hip   . Klebsiella sepsis ([place]) 07/27/2017  . Pyelonephritis 07/23/2017  . Depression with anxiety 07/23/2017  . Acute pyelonephritis   . Encounter for imaging study to confirm nasogastric (NG) tube placement   . History of left hip replacement 04/06/2016  . HIV disease ([place])   . CKD (chronic kidney disease) stage 3, GFR 30-59  ml/min (HCC)   . 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 Avascular necrosis of bone of left hip ([place]) 10/01/2014  . 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 FB GI (foreign body in gastrointestinal tract) 04/09/2014    Past Surgical History:  Procedure Laterality Date  . ANTERIOR HIP REVISION Left 04/06/2016   Procedure: REMOVAL OF TEMPORARY ANTIBIOTIC SPACER LEFT HIP, LEFT TOTAL HIP REVISION;  Surgeon: Mcarthur Rossetti, MD;  Location: [facility];  Service: Orthopedics;  Laterality: Left;  . ANTERIOR HIP REVISION Left 08/02/2017   Procedure: Irrigation and debridement left hip with antibiotic bead placement;  Surgeon: Mcarthur Rossetti, MD;  Location: [facility];  Service: Orthopedics;  Laterality: Left;  . APPENDECTOMY    . COLON SURGERY  2008  . COLOSTOMY REVERSAL  2013  . EXCISIONAL TOTAL HIP ARTHROPLASTY WITH ANTIBIOTIC SPACERS Left 01/24/2016   Procedure: EXCISION OF LEFT TOTAL HIP ARTHROPLASTY WITH PLACEMENT OF ANTIBIOTIC SPACERS;  Surgeon: Mcarthur Rossetti, MD;  Location: [facility];  Service: Orthopedics;  Laterality: Left;  . INCISION AND DRAINAGE HIP Left 01/24/2016   Procedure: IRRIGATION AND DEBRIDEMENT LEFT HIP;  Surgeon: Mcarthur Rossetti, MD;  Location: [facility];  Service: Orthopedics;  Laterality: Left;  . KNEE ARTHROSCOPY     left  . PORT-A-CATH REMOVAL    . TOTAL HIP ARTHROPLASTY Left 10/01/2014   Procedure: LEFT TOTAL HIP ARTHROPLASTY ANTERIOR APPROACH;  Surgeon: Mcarthur Rossetti, MD;  [facility];  Service: Orthopedics;  Laterality: Left;     OB History   No obstetric history on file.      Home Medications    Prior to Admission medications   Medication Sig Start Date End Date Taking? Authorizing Provider  albuterol (PROVENTIL HFA;VENTOLIN HFA) 108 (90 Base) MCG/ACT inhaler Inhale 1-2 puffs into the lungs every 6 (six) hours as needed for wheezing or  shortness of breath. 01/28/19   Tacy Learn, PA-C  ALPRAZolam Duanne Moron) 0.5 MG tablet TAKE ONE TABLET (0.5 MG TOTAL) BY MOUTH AT BEDTIME AS NEEDED FOR SLEEP. 09/03/17   [provider]  ALPRAZolam Duanne Moron) 0.5 MG tablet Take by mouth. 05/23/18   [provider]  aspirin 81 MG tablet Take by mouth. 06/13/11   [provider]  aspirin EC 81 MG tablet Take 81 mg by mouth daily.    [provider]  Calcium Carb-Cholecalciferol 1000-800 MG-UNIT TABS Take by mouth.    [provider]  Calcium Citrate-Vitamin D (CALCIUM + D PO) Take 1 tablet by mouth daily.    [provider]  Calcium Citrate-Vitamin D (CALCIUM CITRATE+D3 PETITES) 200-250 MG-UNIT TABS Take by mouth.    [provider]  ceFAZolin (ANCEF) 10 g injection  09/13/17   [provider]  cephALEXin (KEFLEX) 500 MG capsule TAKE 1 CAPSULE BY MOUTH THREE TIMES A DAY 07/08/18   Carlyle Basques, MD  cephALEXin (KEFLEX) 500 MG capsule Take 1 capsule (500 mg total) by mouth 4 (four) times daily. 02/17/19   Lonzell Dorris, MD  Cephalexin 500 MG tablet Take by mouth. 04/09/18   [provider]  ciprofloxacin (CIPRO) 500 MG tablet Take by mouth.    [provider]  citalopram (CELEXA) 20 MG tablet Take 20 mg by mouth every morning.     [provider]  citalopram (CELEXA) 20 MG tablet TAKE 1 TABLET BY MOUTH EVERY DAY 05/23/18   [provider]  clonazePAM (KLONOPIN) 1 MG tablet Take by mouth. 02/16/10   [provider]  dolutegravir (TIVICAY) 50 MG tablet Take 50 mg by mouth 2 (two) times daily.  05/02/15   [provider]  dolutegravir (TIVICAY) 50 MG tablet TAKE 1 TABLET BY MOUTH TWICE DAILY 02/03/18   [provider]  emtricitabine-tenofovir AF (DESCOVY) 200-25 MG tablet TAKE ONE TABLET BY MOUTH DAILY. 12/09/17   [provider]  ferrous sulfate 325 (65 FE) MG tablet Take 325 mg by mouth 2 (two) times daily.     [provider]  fluconazole (DIFLUCAN) 150 MG tablet TAKE 1 TABLET BY MOUTH ONCE. MAY REPEAT 2 TIMES IF NECESSARY 04/05/18   [provider]  fluticasone (FLONASE) 50 MCG/ACT nasal spray Place 1 spray into both nostrils daily. 01/28/19   Tacy Learn, PA-C  ibuprofen (ADVIL,MOTRIN) 600 MG tablet Take 1 tablet (600 mg total) by mouth every 8 (eight) hours as needed (Body aches, fever). 01/26/19   Ward, Ozella Almond, PA-C  maraviroc (SELZENTRY) 300 MG tablet Take 300 mg by mouth 2 (two) times daily. Reported on 01/24/2016    [provider]  maraviroc (SELZENTRY) 300 MG tablet TAKE 1 TABLET BY MOUTH TWICE  DAILY 03/24/18   [provider]  Multiple Vitamin (MULTIVITAMIN WITH MINERALS) TABS tablet Take 1 tablet by mouth daily.    [provider]  Multiple Vitamins-Minerals (MULTIVITAMIN WITH MINERALS) tablet Take by mouth.    [provider]  nabumetone (RELAFEN) 750 MG tablet Take by mouth. 06/13/11   [provider]  ondansetron (ZOFRAN) 4 MG tablet Take by mouth. 07/03/11   [provider]  oseltamivir (TAMIFLU) 75 MG capsule Take 1 capsule (75 mg total) by mouth every 12 (twelve) hours. 01/26/19   Ward, Ozella Almond, PA-C  oxyCODONE-acetaminophen (PERCOCET/ROXICET) 5-325 MG tablet Take by mouth. 07/27/11   [provider]  phenazopyridine (PYRIDIUM) 200 MG tablet Take 1 tablet (200 mg total) by mouth 3 (three) times daily. 02/17/19   Evangelia Whitaker, MD  promethazine (PHENERGAN) 12.5 MG tablet Take by mouth.    [provider]  raltegravir (ISENTRESS) 400 MG tablet Take by mouth. 06/13/11   [provider]  ritonavir (NORVIR) 100 MG capsule Take by mouth. 06/13/11   [provider]  tenofovir (VIREAD) 300 MG tablet Take by mouth. 02/16/10   [provider]  Vitamins/Minerals TABS Take by mouth.    [provider]    Family History Family History  Problem Relation Age of Onset  . Hypertension  Father   . Diabetes Father   . Heart attack Father   . Hypertension Other   . Diabetes Other     Social History Social History   Tobacco Use  . Smoking status: Current Every Day Smoker    Packs/day: 0.50    Types: Cigarettes  . Smokeless tobacco: Never Used  Substance Use Topics  . Alcohol use: No  . Drug use: No     Allergies   Iodine; Other; Ivp dye [iodinated diagnostic agents]; Sulfa antibiotics; and Codeine   Review of Systems Review of Systems  Constitutional: Positive for fever.  HENT: Negative for sore throat.   Eyes: Negative for photophobia.  Respiratory: Negative for cough and shortness of breath.   Cardiovascular: Negative for chest pain.  Gastrointestinal: Negative for abdominal pain and vomiting.  Genitourinary: Positive for dysuria and frequency. Negative for flank pain and hematuria.  Musculoskeletal: Negative for neck pain.  Neurological: Negative for headaches.  All other systems reviewed and are negative.    Physical Exam Updated Vital Signs BP 111/70 (BP Location: Left Arm)   Pulse 92   Temp 99.2 F (37.3 C) (Oral)   Resp 16   Ht 5\' 10"  (1.778 m)   Wt 90.3 kg   SpO2 97%   BMI 28.55 kg/m   Physical Exam Vitals signs and nursing note reviewed.  Constitutional:      General: She is not in acute distress.    Appearance: Normal appearance.  HENT:     Head: Normocephalic and atraumatic.     Nose: Nose normal.     Mouth/Throat:     Mouth: Mucous membranes are moist.     Pharynx: Oropharynx is clear.  Eyes:     Conjunctiva/sclera: Conjunctivae normal.     Pupils: Pupils are equal, round, and reactive to light.  Neck:     Musculoskeletal: Normal range of motion and neck supple.  Cardiovascular:     Rate and Rhythm: Normal rate and regular rhythm.     Pulses: Normal pulses.     Heart sounds: Normal heart sounds.  Pulmonary:     Effort: Pulmonary effort is normal. No respiratory distress.  Breath sounds: Normal breath sounds. No  wheezing or rales.  Abdominal:     General: Abdomen is flat. Bowel sounds are normal.     Tenderness: There is no abdominal tenderness. There is no right CVA tenderness, left CVA tenderness, guarding or rebound.     Hernia: No hernia is present.  Musculoskeletal: Normal range of motion.  Skin:    General: Skin is warm and dry.     Capillary Refill: Capillary refill takes less than 2 seconds.  Neurological:     General: No focal deficit present.     Mental Status: She is alert and oriented to person, place, and time.  Psychiatric:        Mood and Affect: Mood normal.        Behavior: Behavior normal.      ED Treatments / Results  Labs (all labs ordered are listed, but only abnormal results are displayed) Labs Reviewed  URINALYSIS, ROUTINE W REFLEX MICROSCOPIC - Abnormal; Notable for the following components:      Result Value   APPearance HAZY (*)    Hgb urine dipstick TRACE (*)    Nitrite POSITIVE (*)    Leukocytes,Ua MODERATE (*)    All other components within normal limits  URINALYSIS, MICROSCOPIC (REFLEX) - Abnormal; Notable for the following components:   Bacteria, UA MANY (*)    All other components within normal limits    EKG None  Radiology No results found.  Procedures Procedures (including critical care time)  Medications Ordered in ED Medications  cephALEXin (KEFLEX) capsule 500 mg (has no administration in time range)  phenazopyridine (PYRIDIUM) tablet 200 mg (200 mg Oral Given 02/17/19 0239)  ibuprofen (ADVIL,MOTRIN) tablet 600 mg (600 mg Oral Given 02/17/19 0239)     I do not believe this pyelonephritis at this time.  No signs of sepsis.  Culture of urine sent.    Final Clinical Impressions(s) / ED Diagnoses   Final diagnoses:  Lower urinary tract infectious disease   Return for pain, intractable cough, fevers >100.4 unrelieved by medication, shortness of breath, intractable vomiting, chest pain, shortness of breath, weakness numbness, changes  in speech, facial asymmetry,abdominal pain, passing out,Inability to tolerate liquids or food, cough, altered mental status or any concerns. No signs of systemic illness or infection. The patient is nontoxic-appearing on exam and vital signs are within normal limits.   I have reviewed the triage vital signs and the nursing notes. Pertinent labs &imaging results that were available during my care of the patient were reviewed by me and considered in my medical decision making (see chart for details).  After history, exam, and medical workup I feel the patient has been appropriately medically screened and is safe for discharge home. Pertinent diagnoses were discussed with the patient. Patient was given return precautions. ED Discharge Orders         Ordered    phenazopyridine (PYRIDIUM) 200 MG tablet  3 times daily     02/17/19 0316    cephALEXin (KEFLEX) 500 MG capsule  4 times daily     02/17/19 0316           Amylynn Fano, MD 02/17/19 0321

## 2019-02-17 NOTE — ED Notes (Signed)
ED Provider at bedside. 

## 2019-02-20 LAB — URINE CULTURE: Culture: >=100000 — AB

## 2019-02-21 ENCOUNTER — Telehealth (HOSPITAL_COMMUNITY): Payer: Self-pay | Admitting: Pharmacist

## 2019-02-21 ENCOUNTER — Telehealth: Payer: Self-pay | Admitting: Emergency Medicine

## 2019-02-21 NOTE — Progress Notes (Signed)
ED Antimicrobial Stewardship Positive Culture Follow Up   Ashley Shepherd is an 63 y.o. female who presented to [facility] on (Not on file) with a chief complaint of No chief complaint on file.   Recent Results (from the past 720 hour(s))  Urine culture     Status: Abnormal   Collection Time: 02/17/19  2:30 AM  Result Value Ref Range Status   Specimen Description   Final    URINE, CLEAN CATCH Performed at [facility], [address] [place], [place] [address]    Special Requests   Final    NONE Performed at [facility], [address] [place], [place] [address]    Culture >=100,000 COLONIES/mL ENTEROBACTER CLOACAE (A)  Final   Report Status 02/20/2019 FINAL  Final   Organism ID, Bacteria ENTEROBACTER CLOACAE (A)  Final      Susceptibility   Enterobacter cloacae - MIC*    CEFAZOLIN >=64 RESISTANT Resistant     CEFTRIAXONE >=64 RESISTANT Resistant     CIPROFLOXACIN <=0.25 SENSITIVE Sensitive     GENTAMICIN <=1 SENSITIVE Sensitive     IMIPENEM 1 SENSITIVE Sensitive     NITROFURANTOIN 64 INTERMEDIATE Intermediate     TRIMETH/SULFA <=20 SENSITIVE Sensitive     PIP/TAZO >=128 RESISTANT Resistant     * >=100,000 COLONIES/mL ENTEROBACTER CLOACAE    [x]  Treated with Cephalexin (Keflex), organism resistant to prescribed antimicrobial []  Patient discharged originally without antimicrobial agent and treatment is now indicated  New antibiotic prescription: Please have patient stop Cephalex. New RX: ciprofloxacin 500 mg po bid x 3 days  ED Provider: L. Maralyn Sago   MastersJake Church 02/21/2019, 9:02 AM Clinical Pharmacist Monday - Friday phone -  [phone] Saturday - Sunday phone - [phone]

## 2019-02-21 NOTE — Telephone Encounter (Signed)
Post ED Visit - Positive Culture Follow-up: Successful Patient Follow-Up  Culture assessed and recommendations reviewed by:  []  Elenor Quinones, Pharm.D. []  Heide Guile, Pharm.D., BCPS AQ-ID []  Parks Neptune, Pharm.D., BCPS []  Alycia Rossetti, Pharm.D., BCPS []  [place], [place].D., BCPS, AAHIVP []  Legrand Como, Pharm.D., BCPS, AAHIVP []  Salome Arnt, PharmD, BCPS []  Johnnette Gourd, PharmD, BCPS [x]  Hughes Better, PharmD, BCPS []  Leeroy Cha, PharmD  Positive urine culture  []  Patient discharged without antimicrobial prescription and treatment is now indicated [x]  Organism is resistant to prescribed ED discharge antimicrobial []  Patient with positive blood cultures  Changes discussed with ED provider: Suella Broad PA New antibiotic prescription: Ciprofloxacin 500 mg PO BID x three days Called to [facility] [phone])  Contacted patient, date 02/21/2019, time [place] 02/21/2019, 11:52 AM

## 2019-03-31 ENCOUNTER — Telehealth (INDEPENDENT_AMBULATORY_CARE_PROVIDER_SITE_OTHER): Payer: Self-pay | Admitting: Radiology

## 2019-03-31 NOTE — Telephone Encounter (Signed)
Called and left voicemail asking patient to call us back to answer pre screening questions for appointment on 4/8

## 2019-04-01 ENCOUNTER — Encounter (INDEPENDENT_AMBULATORY_CARE_PROVIDER_SITE_OTHER): Payer: Self-pay | Admitting: Orthopaedic Surgery

## 2019-04-01 ENCOUNTER — Ambulatory Visit (INDEPENDENT_AMBULATORY_CARE_PROVIDER_SITE_OTHER): Payer: Medicare Other | Admitting: Orthopaedic Surgery

## 2019-04-01 ENCOUNTER — Ambulatory Visit (INDEPENDENT_AMBULATORY_CARE_PROVIDER_SITE_OTHER): Payer: Medicare Other

## 2019-04-01 ENCOUNTER — Other Ambulatory Visit: Payer: Self-pay

## 2019-04-01 DIAGNOSIS — M87051 Idiopathic aseptic necrosis of right femur: Secondary | ICD-10-CM | POA: Diagnosis not present

## 2019-04-01 DIAGNOSIS — M25551 Pain in right hip: Secondary | ICD-10-CM

## 2019-04-01 MED ORDER — HYDROCODONE-ACETAMINOPHEN 5-325 MG PO TABS: 1.0000 | ORAL_TABLET | Freq: Four times a day (QID) | ORAL | 0 refills | Status: AC | PRN

## 2019-04-01 NOTE — Progress Notes (Signed)
Office Visit Note   Patient: Ashley Shepherd           Date of Birth: Oct 14, 1956           MRN: [id number] Visit Date: 04/01/2019              Requested by: Medicine, [facility] No address on file PCP: Medicine, [facility] Internal   Assessment & Plan: Visit Diagnoses:  1. Pain in right hip   2. Avascular necrosis of hip, right ([place])     Plan: Patient will continue use a cane or a crutch in her left hand to offload the right hip.  See her back in 3 months and see where we stand in regards to elective surgeries in the COVID-19 pandemic.  She is given Norco to use sparingly for pain.  Questions encouraged and answered at length.  She ultimately understands she will require a right total hip arthroplasty.  Explained to her that a intra-articular injection of the hip definitely could exacerbate things and cause rapid deterioration of the hip.  Questions encouraged and answered at length.  I did ask her to contact her primary care physician in regards to her kidney disease and recommend that she take no NSAIDs until this is cleared up.  Follow-Up Instructions: Return in about 3 months (around 07/01/2019).   Orders:  Orders Placed This Encounter  Procedures  . XR HIP UNILAT W OR W/O PELVIS 1V RIGHT   Meds ordered this encounter  Medications  . HYDROcodone-acetaminophen (NORCO) 5-325 MG tablet    Sig: Take 1-2 tablets by mouth every 6 (six) hours as needed for up to 5 days for moderate pain. One to two tabs every 4-6 hours for pain    Dispense:  30 tablet    Refill:  0      Procedures: No procedures performed   Clinical Data: No additional findings.   Subjective: Chief Complaint  Patient presents with  . Left Hip - Pain  . Right Hip - Pain    HPI Ashley Shepherd comes in today due to increased pain in her right hip.  She states she is having pain when ambulating in the groin region.  Pain even keeps her awake at night.  She is been taking Tylenol and  ibuprofen for the pain without any real relief.  She is using a crutch in her left hand to offload the right hip.  Her chart states that she has chronic kidney disease stage III she denies this.  She states she is never been told she has kidney disease.  Patient is status post left total hip arthroplasty and states that the left hip is doing well. Review of Systems Denies any fevers chills shortness of breath chest pain  Objective: Vital Signs: There were no vitals taken for this visit.  Physical Exam General: Well-developed well-nourished female no acute distress. Ortho Exam Right hip: Decreased internal rotation with pain.  She has discomfort with external rotation.  Right calf supple nontender.  She ambulates with a crutch and an antalgic gait.  Left hip good range of motion without pain. Specialty Comments:  No specialty comments available.  Imaging: Xr Hip Unilat W Or W/o Pelvis 1v Right  Result Date: 04/01/2019 AP pelvis lateral view of the right hip: Progressive avascular necrosis of the right hip but no collapse of the femoral head.  Left total hip arthroplasty appears well-seated without any evidence of hardware failure or infection.  No acute  fractures noted either hip.    PMFS History: Patient Active Problem List   Diagnosis Date Noted  . Surgery, elective   . Abscess of left hip   . Klebsiella sepsis ([place]) 07/27/2017  . Pyelonephritis 07/23/2017  . Depression with anxiety 07/23/2017  . Acute pyelonephritis   . Encounter for imaging study to confirm nasogastric (NG) tube placement   . History of left hip replacement 04/06/2016  . HIV disease ([place])   . CKD (chronic kidney disease) stage 3, GFR 30-59 ml/min (HCC)   . 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 Avascular necrosis of bone of left hip ([place]) 10/01/2014  . 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 FB GI (foreign body in  gastrointestinal tract) 04/09/2014   Past Medical History:  Diagnosis Date  . Anemia   . Anxiety   . Arthritis   . 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[place])    left  . Cancer ([place])    7 years ago, cancer free now  . Depression   . Difficulty sleeping   . GERD (gastroesophageal reflux disease)   . History of kidney stones   . History of transfusion   . HIV disease ([place])   . Kidney stones   . Pneumonia     Family History  Problem Relation Age of Onset  . Hypertension Father   . Diabetes Father   . Heart attack Father   . Hypertension Other   . Diabetes Other     Past Surgical History:  Procedure Laterality Date  . ANTERIOR HIP REVISION Left 04/06/2016   Procedure: REMOVAL OF TEMPORARY ANTIBIOTIC SPACER LEFT HIP, LEFT TOTAL HIP REVISION;  Surgeon: Mcarthur Rossetti, MD;  [facility];  Service: Orthopedics;  Laterality: Left;  . ANTERIOR HIP REVISION Left 08/02/2017   Procedure: Irrigation and debridement left hip with antibiotic bead placement;  Surgeon: Mcarthur Rossetti, MD;  [facility];  Service: Orthopedics;  Laterality: Left;  . APPENDECTOMY    . COLON SURGERY  2008  . COLOSTOMY REVERSAL  2013  . EXCISIONAL TOTAL HIP ARTHROPLASTY WITH ANTIBIOTIC SPACERS Left 01/24/2016   Procedure: EXCISION OF LEFT TOTAL HIP ARTHROPLASTY WITH PLACEMENT OF ANTIBIOTIC SPACERS;  Surgeon: Mcarthur Rossetti, MD;  Location: [facility];  Service: Orthopedics;  Laterality: Left;  . INCISION AND DRAINAGE HIP Left 01/24/2016   Procedure: IRRIGATION AND DEBRIDEMENT LEFT HIP;  Surgeon: Mcarthur Rossetti, MD;  Location: [facility];  Service: Orthopedics;  Laterality: Left;  . KNEE ARTHROSCOPY     left  . PORT-A-CATH REMOVAL    . TOTAL HIP ARTHROPLASTY Left 10/01/2014   Procedure: LEFT TOTAL HIP ARTHROPLASTY ANTERIOR APPROACH;  Surgeon: Mcarthur Rossetti, MD;  [facility];  Service: Orthopedics;  Laterality: Left;   Social History   Occupational History  . Not on file  Tobacco  Use  . Smoking status: Current Every Day Smoker    Packs/day: 0.50    Types: Cigarettes  . Smokeless tobacco: Never Used  Substance and Sexual Activity  . Alcohol use: No  . Drug use: No  . Sexual activity: Not on file

## 2019-04-07 ENCOUNTER — Other Ambulatory Visit (INDEPENDENT_AMBULATORY_CARE_PROVIDER_SITE_OTHER): Payer: Self-pay | Admitting: Orthopaedic Surgery

## 2019-04-07 ENCOUNTER — Encounter (INDEPENDENT_AMBULATORY_CARE_PROVIDER_SITE_OTHER): Payer: Self-pay | Admitting: Orthopaedic Surgery

## 2019-04-07 MED ORDER — TIZANIDINE HCL 4 MG PO TABS: 4.0000 mg | ORAL_TABLET | Freq: Two times a day (BID) | ORAL | 0 refills | Status: DC | PRN

## 2019-04-21 ENCOUNTER — Telehealth (INDEPENDENT_AMBULATORY_CARE_PROVIDER_SITE_OTHER): Payer: Self-pay | Admitting: Orthopaedic Surgery

## 2019-04-21 MED ORDER — HYDROCODONE-ACETAMINOPHEN 5-325 MG PO TABS: 1.0000 | ORAL_TABLET | Freq: Three times a day (TID) | ORAL | 0 refills | Status: DC | PRN

## 2019-04-21 NOTE — Telephone Encounter (Signed)
I sent some in to the [facility] in [facility]

## 2019-04-21 NOTE — Telephone Encounter (Signed)
Please advise 

## 2019-04-21 NOTE — Telephone Encounter (Signed)
Rx refill Hydrocodone °

## 2019-04-29 ENCOUNTER — Other Ambulatory Visit (INDEPENDENT_AMBULATORY_CARE_PROVIDER_SITE_OTHER): Payer: Self-pay | Admitting: Orthopaedic Surgery

## 2019-04-29 NOTE — Telephone Encounter (Signed)
Ok for refill? 

## 2019-05-19 ENCOUNTER — Other Ambulatory Visit (INDEPENDENT_AMBULATORY_CARE_PROVIDER_SITE_OTHER): Payer: Self-pay | Admitting: Orthopaedic Surgery

## 2019-05-20 MED ORDER — HYDROCODONE-ACETAMINOPHEN 5-325 MG PO TABS: 1.0000 | ORAL_TABLET | Freq: Three times a day (TID) | ORAL | 0 refills | Status: DC | PRN

## 2019-05-20 NOTE — Telephone Encounter (Signed)
Please advise 

## 2019-05-23 ENCOUNTER — Other Ambulatory Visit (INDEPENDENT_AMBULATORY_CARE_PROVIDER_SITE_OTHER): Payer: Self-pay | Admitting: Orthopaedic Surgery

## 2019-05-24 NOTE — Telephone Encounter (Signed)
Rx request 

## 2019-06-04 ENCOUNTER — Other Ambulatory Visit (INDEPENDENT_AMBULATORY_CARE_PROVIDER_SITE_OTHER): Payer: Self-pay | Admitting: Orthopaedic Surgery

## 2019-06-04 MED ORDER — HYDROCODONE-ACETAMINOPHEN 5-325 MG PO TABS: 1.0000 | ORAL_TABLET | Freq: Three times a day (TID) | ORAL | 0 refills | Status: DC | PRN

## 2019-06-04 NOTE — Telephone Encounter (Signed)
Dr. Blackman patient 

## 2019-06-04 NOTE — Telephone Encounter (Signed)
Please advise 

## 2019-06-16 ENCOUNTER — Other Ambulatory Visit (INDEPENDENT_AMBULATORY_CARE_PROVIDER_SITE_OTHER): Payer: Self-pay | Admitting: Orthopaedic Surgery

## 2019-06-16 NOTE — Telephone Encounter (Signed)
Please advise 

## 2019-07-01 ENCOUNTER — Other Ambulatory Visit: Payer: Self-pay

## 2019-07-01 ENCOUNTER — Ambulatory Visit (INDEPENDENT_AMBULATORY_CARE_PROVIDER_SITE_OTHER): Payer: Medicare Other | Admitting: Orthopaedic Surgery

## 2019-07-01 ENCOUNTER — Encounter: Payer: Self-pay | Admitting: Orthopaedic Surgery

## 2019-07-01 VITALS — Ht 70.0 in | Wt 199.0 lb

## 2019-07-01 DIAGNOSIS — M25551 Pain in right hip: Secondary | ICD-10-CM

## 2019-07-01 DIAGNOSIS — M87051 Idiopathic aseptic necrosis of right femur: Secondary | ICD-10-CM | POA: Diagnosis not present

## 2019-07-01 MED ORDER — HYDROCODONE-ACETAMINOPHEN 5-325 MG PO TABS: 1.0000 | ORAL_TABLET | Freq: Three times a day (TID) | ORAL | 0 refills | Status: DC | PRN

## 2019-07-01 NOTE — Progress Notes (Signed)
The patient comes in today for continued follow-up with known end-stage avascular necrosis of her right hip.  She is reluctant to proceed with hip replacement surgery.  She is 5 years out from a left total hip arthroplasty.  She did have a postoperative infection with a strep infection which was cleared with implant exchange I&D and a stage revision.  She walks with assist device but is still has significant out of pain.  She is requiring narcotic pain medications and she understands we cannot keep her on this.  She is still concerned about proceeding with surgery.  On exam her left operative hip moves normally and incisions well-healed.  The right hip has severe pain on attempts of internal and external rotation.  At this point only thing I can recommend still for her right hip is a total hip arthroplasty given the severity of her avascular necrosis.  There is nothing else that I can offer her.  I will refill her pain medication just one more time.  I did give her our surgery scheduler's card to consider a replacement surgery.  I empathize with her reluctance to have surgery given her past infection of the left side and I gave her as much reassurance I can move everything with the undertaken to try to assure that she did not get infection on the right hip.  All question concerns were otherwise answered and addressed.

## 2019-07-10 ENCOUNTER — Other Ambulatory Visit (INDEPENDENT_AMBULATORY_CARE_PROVIDER_SITE_OTHER): Payer: Self-pay | Admitting: Orthopaedic Surgery

## 2019-07-10 NOTE — Telephone Encounter (Signed)
PLEASE ADVISE.

## 2019-07-13 NOTE — Telephone Encounter (Signed)
Patient advised.

## 2019-07-15 ENCOUNTER — Encounter: Payer: Self-pay | Admitting: Orthopaedic Surgery

## 2019-07-15 DIAGNOSIS — M87051 Idiopathic aseptic necrosis of right femur: Secondary | ICD-10-CM

## 2019-07-16 ENCOUNTER — Other Ambulatory Visit: Payer: Self-pay | Admitting: Orthopaedic Surgery

## 2019-07-16 MED ORDER — HYDROCODONE-ACETAMINOPHEN 5-325 MG PO TABS: 1.0000 | ORAL_TABLET | Freq: Three times a day (TID) | ORAL | 0 refills | Status: DC | PRN

## 2019-07-16 NOTE — Telephone Encounter (Signed)
Patient aware, also advised referral will be placed for pain management.

## 2019-07-16 NOTE — Telephone Encounter (Signed)
Please advise 

## 2019-07-31 ENCOUNTER — Encounter: Payer: Self-pay | Admitting: Physical Medicine and Rehabilitation

## 2019-08-03 ENCOUNTER — Other Ambulatory Visit (INDEPENDENT_AMBULATORY_CARE_PROVIDER_SITE_OTHER): Payer: Self-pay | Admitting: Orthopaedic Surgery

## 2019-08-03 NOTE — Telephone Encounter (Signed)
Please advise 

## 2019-08-07 ENCOUNTER — Other Ambulatory Visit: Payer: Self-pay | Admitting: Orthopaedic Surgery

## 2019-08-07 MED ORDER — HYDROCODONE-ACETAMINOPHEN 5-325 MG PO TABS: 1.0000 | ORAL_TABLET | Freq: Three times a day (TID) | ORAL | 0 refills | Status: DC | PRN

## 2019-08-07 NOTE — Telephone Encounter (Signed)
Ok to rf? 

## 2019-08-12 ENCOUNTER — Encounter: Payer: Medicare Other | Admitting: Physical Medicine and Rehabilitation

## 2019-08-27 ENCOUNTER — Other Ambulatory Visit (INDEPENDENT_AMBULATORY_CARE_PROVIDER_SITE_OTHER): Payer: Self-pay | Admitting: Orthopaedic Surgery

## 2019-08-27 NOTE — Telephone Encounter (Signed)
Please advise 

## 2019-08-28 ENCOUNTER — Encounter: Payer: Medicare Other | Admitting: Physical Medicine and Rehabilitation

## 2019-09-07 ENCOUNTER — Other Ambulatory Visit: Payer: Self-pay | Admitting: Orthopaedic Surgery

## 2019-09-07 MED ORDER — HYDROCODONE-ACETAMINOPHEN 5-325 MG PO TABS: 1.0000 | ORAL_TABLET | Freq: Three times a day (TID) | ORAL | 0 refills | Status: DC | PRN

## 2019-09-07 NOTE — Telephone Encounter (Signed)
CB patient  

## 2019-09-07 NOTE — Telephone Encounter (Signed)
Please advise 

## 2019-09-20 ENCOUNTER — Other Ambulatory Visit (INDEPENDENT_AMBULATORY_CARE_PROVIDER_SITE_OTHER): Payer: Self-pay | Admitting: Orthopaedic Surgery

## 2019-09-21 NOTE — Telephone Encounter (Signed)
Please advise 

## 2019-09-30 ENCOUNTER — Other Ambulatory Visit: Payer: Self-pay | Admitting: Orthopaedic Surgery

## 2019-09-30 MED ORDER — HYDROCODONE-ACETAMINOPHEN 5-325 MG PO TABS: 1.0000 | ORAL_TABLET | Freq: Three times a day (TID) | ORAL | 0 refills | Status: DC | PRN

## 2019-09-30 NOTE — Telephone Encounter (Signed)
Please advise 

## 2019-09-30 NOTE — Telephone Encounter (Signed)
Dr. Blackman patient 

## 2019-10-14 ENCOUNTER — Other Ambulatory Visit (INDEPENDENT_AMBULATORY_CARE_PROVIDER_SITE_OTHER): Payer: Self-pay | Admitting: Orthopaedic Surgery

## 2019-10-14 NOTE — Telephone Encounter (Signed)
Please advise 

## 2019-10-15 ENCOUNTER — Other Ambulatory Visit: Payer: Self-pay | Admitting: Orthopaedic Surgery

## 2019-10-15 MED ORDER — HYDROCODONE-ACETAMINOPHEN 5-325 MG PO TABS: 1.0000 | ORAL_TABLET | Freq: Three times a day (TID) | ORAL | 0 refills | Status: DC | PRN

## 2019-10-15 NOTE — Telephone Encounter (Signed)
CB pt 

## 2019-10-15 NOTE — Telephone Encounter (Signed)
Please advise 

## 2019-11-08 ENCOUNTER — Other Ambulatory Visit (INDEPENDENT_AMBULATORY_CARE_PROVIDER_SITE_OTHER): Payer: Self-pay | Admitting: Orthopaedic Surgery

## 2019-11-09 ENCOUNTER — Encounter: Payer: Self-pay | Admitting: Orthopaedic Surgery

## 2019-11-09 ENCOUNTER — Other Ambulatory Visit: Payer: Self-pay | Admitting: Orthopaedic Surgery

## 2019-11-09 MED ORDER — HYDROCODONE-ACETAMINOPHEN 5-325 MG PO TABS: 1.0000 | ORAL_TABLET | Freq: Three times a day (TID) | ORAL | 0 refills | Status: DC | PRN

## 2019-11-09 NOTE — Telephone Encounter (Signed)
Please advise 

## 2019-11-27 ENCOUNTER — Other Ambulatory Visit: Payer: Self-pay | Admitting: Orthopaedic Surgery

## 2019-11-27 ENCOUNTER — Encounter: Payer: Self-pay | Admitting: Orthopaedic Surgery

## 2019-11-27 MED ORDER — HYDROCODONE-ACETAMINOPHEN 5-325 MG PO TABS: 1.0000 | ORAL_TABLET | Freq: Three times a day (TID) | ORAL | 0 refills | Status: DC | PRN

## 2019-12-01 ENCOUNTER — Other Ambulatory Visit (INDEPENDENT_AMBULATORY_CARE_PROVIDER_SITE_OTHER): Payer: Self-pay | Admitting: Orthopaedic Surgery

## 2019-12-01 NOTE — Telephone Encounter (Signed)
Ok to refill 

## 2019-12-24 ENCOUNTER — Other Ambulatory Visit: Payer: Self-pay | Admitting: Orthopaedic Surgery

## 2019-12-24 MED ORDER — HYDROCODONE-ACETAMINOPHEN 5-325 MG PO TABS: 1.0000 | ORAL_TABLET | Freq: Three times a day (TID) | ORAL | 0 refills | Status: DC | PRN

## 2019-12-24 NOTE — Telephone Encounter (Signed)
Please advise 

## 2019-12-28 ENCOUNTER — Telehealth: Payer: Self-pay

## 2019-12-28 ENCOUNTER — Other Ambulatory Visit (INDEPENDENT_AMBULATORY_CARE_PROVIDER_SITE_OTHER): Payer: Self-pay | Admitting: Orthopaedic Surgery

## 2019-12-28 MED ORDER — TIZANIDINE HCL 4 MG PO TABS: 4.0000 mg | ORAL_TABLET | Freq: Two times a day (BID) | ORAL | 0 refills | Status: DC | PRN

## 2019-12-28 NOTE — Telephone Encounter (Signed)
CB pt 

## 2019-12-28 NOTE — Telephone Encounter (Signed)
Please advise 

## 2019-12-28 NOTE — Telephone Encounter (Signed)
Refill request for tizanidine

## 2020-01-11 ENCOUNTER — Other Ambulatory Visit: Payer: Self-pay | Admitting: Orthopaedic Surgery

## 2020-01-11 NOTE — Telephone Encounter (Signed)
Hold for Blackman. 

## 2020-01-11 NOTE — Telephone Encounter (Signed)
This is a CB patient 

## 2020-01-11 NOTE — Telephone Encounter (Signed)
CB Patient

## 2020-01-13 NOTE — Telephone Encounter (Signed)
This is a duplicate. You have already refused previous. Can you please refuse this? It will not allow me to. Thanks!

## 2020-01-19 ENCOUNTER — Other Ambulatory Visit (INDEPENDENT_AMBULATORY_CARE_PROVIDER_SITE_OTHER): Payer: Self-pay | Admitting: Orthopaedic Surgery

## 2020-01-19 NOTE — Telephone Encounter (Signed)
Ok to refill 

## 2020-01-29 ENCOUNTER — Encounter: Payer: Self-pay | Admitting: Physical Medicine and Rehabilitation

## 2020-01-29 ENCOUNTER — Other Ambulatory Visit: Payer: Self-pay

## 2020-01-29 ENCOUNTER — Encounter: Payer: Medicare HMO | Attending: Physical Medicine and Rehabilitation | Admitting: Physical Medicine and Rehabilitation

## 2020-01-29 VITALS — BP 146/78 | HR 81 | Temp 97.5°F | Ht 70.0 in | Wt 202.0 lb

## 2020-01-29 DIAGNOSIS — Z96642 Presence of left artificial hip joint: Secondary | ICD-10-CM | POA: Insufficient documentation

## 2020-01-29 DIAGNOSIS — Z5181 Encounter for therapeutic drug level monitoring: Secondary | ICD-10-CM | POA: Diagnosis present

## 2020-01-29 DIAGNOSIS — M87051 Idiopathic aseptic necrosis of right femur: Secondary | ICD-10-CM | POA: Diagnosis present

## 2020-01-29 DIAGNOSIS — M87052 Idiopathic aseptic necrosis of left femur: Secondary | ICD-10-CM

## 2020-01-29 DIAGNOSIS — Z79891 Long term (current) use of opiate analgesic: Secondary | ICD-10-CM | POA: Insufficient documentation

## 2020-01-29 MED ORDER — HYDROCODONE-ACETAMINOPHEN 7.5-325 MG PO TABS: 1.0000 | ORAL_TABLET | Freq: Three times a day (TID) | ORAL | 0 refills | Status: DC | PRN

## 2020-01-29 NOTE — Patient Instructions (Signed)
Patient I a 64 yr old female  HIV- undetectable for 9 years; no other major issues except B/L hip DJD and s/p L THR with infection noted s/p surgery- Had ear infection of L ear and then had 3 surgeries of L hip. Hx of anterior hip replacements on L.   1. Will get oral drug screen vs urine drug screen today and pain contract  2. Will give Norco/Hydrocodone 7.5 mg/325 mg q8 hours as needed for chronic pain with goal to do until R hip replacement.  Has to call and talk to scheduler.   3, No kidney issues- per pt- however had Cr of 1.43 in 03/2018- so will hold off on prescription strength NSAIDs- suggest ibuprofen 200 -400 mg no more 3x/week. - should be tolerated by her kidneys.Per pt never had issues with kidneys.   PCP can give higher dose of NSAIDs if he wants, in future.    4.  F/U in 2 months

## 2020-01-29 NOTE — Addendum Note (Signed)
Addended by: Geryl Rankins D on: 01/29/2020 11:40 AM   Modules accepted: Orders

## 2020-01-29 NOTE — Progress Notes (Signed)
Subjective:    Patient ID: Ashley Shepherd, female    DOB: 01-15-1956, 64 y.o.   MRN: [id number]  HPI Patient I a 64 yr old female  HIV- undetectable for 9 years; no other major issues except B/L hip DJD and s/p L THR with infection noted s/p surgery- Had ear infection of L ear and then had 3 surgeries of L hip. Hx of anterior hip replacements on L.   Difficulty walking- doesn't use cane but sits as much as possible.  And furniture walks.  Having muscle spasms and kicking husband at night due to pain.   Hydrocodone- taking for pain- takes the edge off- lasts a few hours.  Wished it helped more.  Hasn't had Norco since December - husband in [place] 12/20- tires went over hood- bent frame- and had to fix that.    Needs  R hip replaced-hurts really bad- 24 hours/day.  ROM is also decreased.     Went to PCP- couldn't do PAP smear because hurt so bad.    Pain Inventory Average Pain 10 Pain Right Now 10 My pain is constant, sharp, burning, stabbing and aching  In the last 24 hours, has pain interfered with the following? General activity 4 Relation with others 8 Enjoyment of life 9 What TIME of day is your pain at its worst? all Sleep (in general) Poor  Pain is worse with: walking, inactivity, standing and some activites Pain improves with: medication Relief from Meds: 5  Mobility walk without assistance how many minutes can you walk? 10 ability to climb steps?  yes do you drive?  no  Function disabled: date disabled .  Neuro/Psych trouble walking spasms anxiety  Prior Studies new  Physicians involved in your care new   Family History  Problem Relation Age of Onset  . Hypertension Father   . Diabetes Father   . Heart attack Father   . Hypertension Other   . Diabetes Other    Social History   Socioeconomic History  . Marital status: Widowed    Spouse name: Not on file  . Number of children: Not on file  . Years of education: Not on file  . Highest  education level: Not on file  Occupational History  . Not on file  Tobacco Use  . Smoking status: Current Every Day Smoker    Packs/day: 0.50    Types: Cigarettes  . Smokeless tobacco: Never Used  Substance and Sexual Activity  . Alcohol use: No  . Drug use: No  . Sexual activity: Not on file  Other Topics Concern  . Not on file  Social History Narrative  . Not on file   Social Determinants of Health   Financial Resource Strain:   . Difficulty of Paying Living Expenses: Not on file  Food Insecurity:   . Worried About Charity fundraiser in the Last Year: Not on file  . Ran Out of Food in the Last Year: Not on file  Transportation Needs:   . Lack of Transportation (Medical): Not on file  . Lack of Transportation (Non-Medical): Not on file  Physical Activity:   . Days of Exercise per Week: Not on file  . Minutes of Exercise per Session: Not on file  Stress:   . Feeling of Stress : Not on file  Social Connections:   . Frequency of Communication with Friends and Family: Not on file  . Frequency of Social Gatherings with Friends and Family: Not on file  .  Attends Religious Services: Not on file  . Active Member of Clubs or Organizations: Not on file  . Attends Archivist Meetings: Not on file  . Marital Status: Not on file   Past Surgical History:  Procedure Laterality Date  . ANTERIOR HIP REVISION Left 04/06/2016   Procedure: REMOVAL OF TEMPORARY ANTIBIOTIC SPACER LEFT HIP, LEFT TOTAL HIP REVISION;  Surgeon: Mcarthur Rossetti, MD;  Location: [facility];  Service: Orthopedics;  Laterality: Left;  . ANTERIOR HIP REVISION Left 08/02/2017   Procedure: Irrigation and debridement left hip with antibiotic bead placement;  Surgeon: Mcarthur Rossetti, MD;  Location: [facility];  Service: Orthopedics;  Laterality: Left;  . APPENDECTOMY    . COLON SURGERY  2008  . COLOSTOMY REVERSAL  2013  . EXCISIONAL TOTAL HIP ARTHROPLASTY WITH ANTIBIOTIC SPACERS Left 01/24/2016    Procedure: EXCISION OF LEFT TOTAL HIP ARTHROPLASTY WITH PLACEMENT OF ANTIBIOTIC SPACERS;  Surgeon: Mcarthur Rossetti, MD;  Location: [place];  Service: Orthopedics;  Laterality: Left;  . INCISION AND DRAINAGE HIP Left 01/24/2016   Procedure: IRRIGATION AND DEBRIDEMENT LEFT HIP;  Surgeon: Mcarthur Rossetti, MD;  Location: [place];  Service: Orthopedics;  Laterality: Left;  . KNEE ARTHROSCOPY     left  . PORT-A-CATH REMOVAL    . TOTAL HIP ARTHROPLASTY Left 10/01/2014   Procedure: LEFT TOTAL HIP ARTHROPLASTY ANTERIOR APPROACH;  Surgeon: Mcarthur Rossetti, MD;  Location: [facility];  Service: Orthopedics;  Laterality: Left;   Past Medical History:  Diagnosis Date  . Anemia   . Anxiety   . Arthritis   . 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[place])    left  . Cancer ([place])    7 years ago, cancer free now  . Depression   . Difficulty sleeping   . GERD (gastroesophageal reflux disease)   . History of kidney stones   . History of transfusion   . HIV disease ([place])   . Kidney stones   . Pneumonia    BP (!) 146/78   Pulse 81   Temp (!) 97.5 F (36.4 C)   Ht 5\' 10"  (1.778 m)   Wt 202 lb (91.6 kg)   SpO2 97%   BMI 28.98 kg/m   Opioid Risk Score:   Fall Risk Score:  `1  Depression screen PHQ 2/9  Depression screen [facility] 2/9 01/29/2020 04/08/2018 01/21/2018 10/29/2017 09/24/2017 03/06/2016  Decreased Interest 0 0 0 0 0 0  Down, Depressed, Hopeless 0 0 0 0 0 0  PHQ - 2 Score 0 0 0 0 0 0  Altered sleeping 2 - - - - -  Tired, decreased energy 1 - - - - -  Change in appetite 0 - - - - -  Feeling bad or failure about yourself  0 - - - - -  Trouble concentrating 0 - - - - -  Moving slowly or fidgety/restless 0 - - - - -  Suicidal thoughts 0 - - - - -  PHQ-9 Score 3 - - - - -    Review of Systems  Constitutional: Negative.   HENT: Negative.   Eyes: Negative.   Respiratory: Negative.   Cardiovascular: Negative.   Gastrointestinal: Negative.   Endocrine: Negative.   Genitourinary: Negative.    Musculoskeletal: Positive for arthralgias, back pain and gait problem.  Skin: Negative.   Allergic/Immunologic: Negative.   Psychiatric/Behavioral: Negative.   All other systems reviewed and are negative.      Objective:   Physical Exam Awake,  alert, appropriate, accompanied by husband, NAD Severely reduced ROM of R hip- has ~ 5-10 degrees of external rotation ~<5 degrees internal rotation, and ~ 20 degrees hip flexion. MS: HF 2/5 due to pain KE and KF 4+/5- affected by pain DF 4/5 PF 2/5 due to pain  Neuro: intact to light touch on LEs R and L.        Assessment & Plan:   Patient I a 64 yr old female  HIV- undetectable for 9 years; no other major issues except B/L hip DJD and s/p L THR with infection noted s/p surgery- Had ear infection of L ear and then had 3 surgeries of L hip. Hx of anterior hip replacements on L.   1. Will get oral drug screen vs urine drug screen today and pain contract  2. Will give Norco/Hydrocodone 7.5 mg/325 mg q8 hours as needed for chronic pain with goal to do until R hip replacement. Has ot call and talk to scheduler.    3, No kidney issues- per pt- however had Cr of 1.43 in 03/2018- so will hold off on prescription strength NSAIDs- suggest ibuprofen 200 -400 mg no more 3x/week. - should be tolerated by her kidneys.Per pt never had issues with kidneys.   PCP can give higher dose of NSAIDs if he wants, in future.    4.  F/U in 2 months

## 2020-02-02 LAB — DRUG TOX MONITOR 1 W/CONF, ORAL FLD
Amphetamines: NEGATIVE ng/mL (ref <10)
Barbiturates: NEGATIVE ng/mL (ref <10)
Benzodiazepines: NEGATIVE ng/mL (ref <0.50)
Buprenorphine: NEGATIVE ng/mL (ref <0.10)
Cocaine: NEGATIVE ng/mL (ref <5.0)
Cotinine: >250 ng/mL — ABNORMAL HIGH (ref <5.0)
Fentanyl: NEGATIVE ng/mL (ref <0.10)
Heroin Metabolite: NEGATIVE ng/mL (ref <1.0)
MARIJUANA: NEGATIVE ng/mL (ref <2.5)
MDMA: NEGATIVE ng/mL (ref <10)
Meprobamate: NEGATIVE ng/mL (ref <2.5)
Methadone: NEGATIVE ng/mL (ref <5.0)
Nicotine Metabolite: POSITIVE ng/mL — AB (ref <5.0)
Opiates: NEGATIVE ng/mL (ref <2.5)
Phencyclidine: NEGATIVE ng/mL (ref <10)
Tapentadol: NEGATIVE ng/mL (ref <5.0)
Tramadol: NEGATIVE ng/mL (ref <5.0)
Zolpidem: NEGATIVE ng/mL (ref <5.0)

## 2020-02-02 LAB — DRUG TOX ALC METAB W/CON, ORAL FLD: Alcohol Metabolite: NEGATIVE ng/mL (ref <25)

## 2020-02-20 ENCOUNTER — Other Ambulatory Visit (INDEPENDENT_AMBULATORY_CARE_PROVIDER_SITE_OTHER): Payer: Self-pay | Admitting: Orthopaedic Surgery

## 2020-02-22 MED ORDER — TIZANIDINE HCL 4 MG PO TABS: 4.0000 mg | ORAL_TABLET | Freq: Two times a day (BID) | ORAL | 0 refills | Status: DC | PRN

## 2020-02-22 NOTE — Telephone Encounter (Signed)
[facility] patient 

## 2020-02-22 NOTE — Telephone Encounter (Signed)
Refill

## 2020-02-25 ENCOUNTER — Other Ambulatory Visit: Payer: Self-pay

## 2020-02-26 MED ORDER — HYDROCODONE-ACETAMINOPHEN 7.5-325 MG PO TABS: 1.0000 | ORAL_TABLET | Freq: Three times a day (TID) | ORAL | 0 refills | Status: DC | PRN

## 2020-02-26 NOTE — Telephone Encounter (Signed)
Recieved electronic medication refill request for hydrocodone-apap.  According to previous E-mail message dated 02-22-2020:  If you have gotten from Dr Ninfa Linden, Ashley Shepherd is fine to continue to get from that doctor- its controlled substances like  opiates, tramadol, etc that I need to prescribe- if he/she cna't prescribe, I'm happy to give it to you as well.  According to PMP website:  Fill date  Medication     amount provider 01/29/2020  Hydrocodone-Acetamin 7.5-325   90.00  Me Lov  Patients next appointment date is 03-28-2020

## 2020-03-15 ENCOUNTER — Other Ambulatory Visit (INDEPENDENT_AMBULATORY_CARE_PROVIDER_SITE_OTHER): Payer: Self-pay | Admitting: Orthopaedic Surgery

## 2020-03-15 NOTE — Telephone Encounter (Signed)
[facility]VM Rx is sent.

## 2020-03-28 ENCOUNTER — Encounter: Payer: Self-pay | Admitting: Physical Medicine and Rehabilitation

## 2020-03-28 ENCOUNTER — Telehealth: Payer: Self-pay | Admitting: *Deleted

## 2020-03-28 ENCOUNTER — Encounter
Payer: Medicare Other | Attending: Physical Medicine and Rehabilitation | Admitting: Physical Medicine and Rehabilitation

## 2020-03-28 ENCOUNTER — Other Ambulatory Visit: Payer: Self-pay

## 2020-03-28 VITALS — BP 150/86 | HR 71 | Ht 70.0 in | Wt 205.0 lb

## 2020-03-28 DIAGNOSIS — Z79891 Long term (current) use of opiate analgesic: Secondary | ICD-10-CM | POA: Insufficient documentation

## 2020-03-28 DIAGNOSIS — M87051 Idiopathic aseptic necrosis of right femur: Secondary | ICD-10-CM | POA: Diagnosis present

## 2020-03-28 DIAGNOSIS — Z5181 Encounter for therapeutic drug level monitoring: Secondary | ICD-10-CM | POA: Insufficient documentation

## 2020-03-28 DIAGNOSIS — M87052 Idiopathic aseptic necrosis of left femur: Secondary | ICD-10-CM | POA: Insufficient documentation

## 2020-03-28 DIAGNOSIS — Z96642 Presence of left artificial hip joint: Secondary | ICD-10-CM | POA: Diagnosis present

## 2020-03-28 MED ORDER — HYDROCODONE-ACETAMINOPHEN 7.5-325 MG PO TABS: 1.0000 | ORAL_TABLET | Freq: Four times a day (QID) | ORAL | 0 refills | Status: DC | PRN

## 2020-03-28 NOTE — Telephone Encounter (Signed)
Oral swab drug screen was consistent for no prescribed narcotic medications. Her last dose of Hydrocodone was in December 2020.

## 2020-03-28 NOTE — Patient Instructions (Signed)
Patient I a 64 yr old female  HIV- undetectable for 9 years; no other major issues except B/L hip DJD and s/p L THR with infection noted s/p surgery- Had ear infection of L ear and then had 3 surgeries of L hip. Hx of anterior hip replacements on L.   1. Refilled pain meds Norco 7/5/325 mg q6 hours max 3x/day- #90  2. Needs to get pain contract today. Missing from last time.   3. F/U- 8 weeks - wants to get COVID vaccine prior.  Has a rolling walk, cane and crutches.- not using any today.

## 2020-03-28 NOTE — Progress Notes (Signed)
Subjective:    Patient ID: Ashley Shepherd, female    DOB: [birth year], 64 y.o.   MRN: [id number]  HPI  Patient I a 64 yr old female  HIV- undetectable for 9 years; no other major issues except B/L hip DJD and s/p L THR with infection noted s/p surgery- Had ear infection of L ear and then had 3 surgeries of L hip. Hx of anterior hip replacements on L.    Still hurting Helped for awhile to go up on Norco 7/5/325 mg 8 hours.   Getting COVID shots- some time soon- due to underlying condition.  Wants to get them.   Husband had posterior approach- pt had anterior approach on last surgery.     Pain Inventory Average Pain 10 Pain Right Now 10 My pain is sharp, burning and aching  In the last 24 hours, has pain interfered with the following? General activity 6 Relation with others 7 Enjoyment of life 7 What TIME of day is your pain at its worst? morning Sleep (in general) Poor  Pain is worse with: walking, bending, sitting, inactivity, standing and some activites Pain improves with: medication Relief from Meds: 6  Mobility walk without assistance do you drive?  no Do you have any goals in this area?  yes  Function disabled: date disabled . retired  Neuro/Psych weakness tingling spasms  Prior Studies Any changes since last visit?  no  Physicians involved in your care Any changes since last visit?  no   Family History  Problem Relation Age of Onset  . Hypertension Father   . Diabetes Father   . Heart attack Father   . Hypertension Other   . Diabetes Other    Social History   Socioeconomic History  . Marital status: Widowed    Spouse name: Not on file  . Number of children: Not on file  . Years of education: Not on file  . Highest education level: Not on file  Occupational History  . Not on file  Tobacco Use  . Smoking status: Current Every Day Smoker    Packs/day: 0.50    Types: Cigarettes  . Smokeless tobacco: Never Used  Substance and Sexual  Activity  . Alcohol use: No  . Drug use: No  . Sexual activity: Not on file  Other Topics Concern  . Not on file  Social History Narrative  . Not on file   Social Determinants of Health   Financial Resource Strain:   . Difficulty of Paying Living Expenses:   Food Insecurity:   . Worried About Charity fundraiser in the Last Year:   . Arboriculturist in the Last Year:   Transportation Needs:   . Film/video editor (Medical):   Marland Kitchen Lack of Transportation (Non-Medical):   Physical Activity:   . Days of Exercise per Week:   . Minutes of Exercise per Session:   Stress:   . Feeling of Stress :   Social Connections:   . Frequency of Communication with Friends and Family:   . Frequency of Social Gatherings with Friends and Family:   . Attends Religious Services:   . Active Member of Clubs or Organizations:   . Attends Archivist Meetings:   Marland Kitchen Marital Status:    Past Surgical History:  Procedure Laterality Date  . ANTERIOR HIP REVISION Left 04/06/2016   Procedure: REMOVAL OF TEMPORARY ANTIBIOTIC SPACER LEFT HIP, LEFT TOTAL HIP REVISION;  Surgeon: Mcarthur Rossetti, MD;  [facility];  Service: Orthopedics;  Laterality: Left;  . ANTERIOR HIP REVISION Left 08/02/2017   Procedure: Irrigation and debridement left hip with antibiotic bead placement;  Surgeon: Mcarthur Rossetti, MD;  [facility];  Service: Orthopedics;  Laterality: Left;  . APPENDECTOMY    . COLON SURGERY  2008  . COLOSTOMY REVERSAL  2013  . EXCISIONAL TOTAL HIP ARTHROPLASTY WITH ANTIBIOTIC SPACERS Left 01/24/2016   Procedure: EXCISION OF LEFT TOTAL HIP ARTHROPLASTY WITH PLACEMENT OF ANTIBIOTIC SPACERS;  Surgeon: Mcarthur Rossetti, MD;  Location: [place];  Service: Orthopedics;  Laterality: Left;  . INCISION AND DRAINAGE HIP Left 01/24/2016   Procedure: IRRIGATION AND DEBRIDEMENT LEFT HIP;  Surgeon: Mcarthur Rossetti, MD;  Location: [place];  Service: Orthopedics;  Laterality: Left;  .  KNEE ARTHROSCOPY     left  . PORT-A-CATH REMOVAL    . TOTAL HIP ARTHROPLASTY Left 10/01/2014   Procedure: LEFT TOTAL HIP ARTHROPLASTY ANTERIOR APPROACH;  Surgeon: Mcarthur Rossetti, MD;  [facility];  Service: Orthopedics;  Laterality: Left;   Past Medical History:  Diagnosis Date  . Anemia   . Anxiety   . Arthritis   . 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[place])    left  . Cancer ([place])    7 years ago, cancer free now  . Depression   . Difficulty sleeping   . GERD (gastroesophageal reflux disease)   . History of kidney stones   . History of transfusion   . HIV disease ([place])   . Kidney stones   . Pneumonia    There were no vitals taken for this visit.  Opioid Risk Score:   Fall Risk Score:  `1  Depression screen PHQ 2/9  Depression screen [facility] 2/9 01/29/2020 04/08/2018 01/21/2018 10/29/2017 09/24/2017 03/06/2016  Decreased Interest 0 0 0 0 0 0  Down, Depressed, Hopeless 0 0 0 0 0 0  PHQ - 2 Score 0 0 0 0 0 0  Altered sleeping 2 - - - - -  Tired, decreased energy 1 - - - - -  Change in appetite 0 - - - - -  Feeling bad or failure about yourself  0 - - - - -  Trouble concentrating 0 - - - - -  Moving slowly or fidgety/restless 0 - - - - -  Suicidal thoughts 0 - - - - -  PHQ-9 Score 3 - - - - -    Review of Systems  Constitutional: Negative.   HENT: Negative.   Respiratory: Negative.   Cardiovascular: Negative.   Gastrointestinal: Negative.   Endocrine: Negative.   Genitourinary: Negative.   Musculoskeletal: Positive for arthralgias.  Skin: Negative.   Allergic/Immunologic: Negative.   Neurological: Positive for weakness and numbness.       Tingling  Hematological: Negative.   Psychiatric/Behavioral: Negative.   All other systems reviewed and are negative.      Objective:   Physical Exam  Awake, alert, appropriate, accompanied by husband, NAD External/internal rotation causes increased pain in R hip TTP over groin      Assessment & Plan:  Patient I a 64 yr  old female  HIV- undetectable for 9 years; no other major issues except B/L hip DJD and s/p L THR with infection noted s/p surgery- Had ear infection of L ear and then had 3 surgeries of L hip. Hx of anterior hip replacements on L.   1. Refilled pain meds Norco 7/5/325 mg q6 hours max 3x/day- #90  2. Needs to get pain contract today. Missing from last time.   3. F/U- 8 weeks

## 2020-04-05 ENCOUNTER — Emergency Department (HOSPITAL_BASED_OUTPATIENT_CLINIC_OR_DEPARTMENT_OTHER)
Admission: EM | Admit: 2020-04-05 | Discharge: 2020-04-05 | Disposition: A | Discharge status: Home / Self Care | Payer: Medicare Other | Attending: Emergency Medicine | Admitting: Emergency Medicine

## 2020-04-05 ENCOUNTER — Other Ambulatory Visit: Payer: Self-pay

## 2020-04-05 ENCOUNTER — Encounter (HOSPITAL_BASED_OUTPATIENT_CLINIC_OR_DEPARTMENT_OTHER): Payer: Self-pay

## 2020-04-05 DIAGNOSIS — F17210 Nicotine dependence, cigarettes, uncomplicated: Secondary | ICD-10-CM | POA: Diagnosis not present

## 2020-04-05 DIAGNOSIS — B20 Human immunodeficiency virus [HIV] disease: Secondary | ICD-10-CM | POA: Diagnosis not present

## 2020-04-05 DIAGNOSIS — R252 Cramp and spasm: Secondary | ICD-10-CM

## 2020-04-05 DIAGNOSIS — Z79899 Other long term (current) drug therapy: Secondary | ICD-10-CM | POA: Diagnosis not present

## 2020-04-05 DIAGNOSIS — Z7982 Long term (current) use of aspirin: Secondary | ICD-10-CM | POA: Diagnosis not present

## 2020-04-05 DIAGNOSIS — M79606 Pain in leg, unspecified: Secondary | ICD-10-CM | POA: Diagnosis present

## 2020-04-05 DIAGNOSIS — N183 Chronic kidney disease, stage 3 unspecified: Secondary | ICD-10-CM | POA: Insufficient documentation

## 2020-04-05 LAB — BASIC METABOLIC PANEL
Anion gap: 8 (ref 5–15)
BUN: 15 mg/dL (ref 8–23)
CO2: 26 mmol/L (ref 22–32)
Calcium: 9.2 mg/dL (ref 8.9–10.3)
Chloride: 107 mmol/L (ref 98–111)
Creatinine, Ser: 1.22 mg/dL — ABNORMAL HIGH (ref 0.44–1.00)
GFR calc Af Amer: 54 mL/min — ABNORMAL LOW (ref >60)
GFR calc non Af Amer: 47 mL/min — ABNORMAL LOW (ref >60)
Glucose, Bld: 126 mg/dL — ABNORMAL HIGH (ref 70–99)
Potassium: 3.8 mmol/L (ref 3.5–5.1)
Sodium: 141 mmol/L (ref 135–145)

## 2020-04-05 LAB — CK: Total CK: 195 U/L (ref 38–234)

## 2020-04-05 NOTE — Discharge Instructions (Signed)
Please read the attachment on leg cramps.  Please drink plenty of fluids as I believe that dehydration may have played a role.  I suspect that this was related to your exertion this past weekend mowing the lawn.  I have low suspicion for DVT at this time.  However, if you notice any significant swelling, redness, or worsening discomfort, I encourage you to come to the ED.  Please follow-up with your primary care provider regarding today's encounter.  Return to the ED or seek immediate medical attention for any new or worsening symptoms.

## 2020-04-05 NOTE — ED Provider Notes (Signed)
[facility] EMERGENCY DEPARTMENT Provider Note   CSN: [id number] Arrival date & time: 04/05/20  1114     History Chief Complaint  Patient presents with   Leg Pain    Ashley Shepherd is a 64 y.o. female with PMH significant for HIV, cancer, arthritis, and avascular necrosis of the hip presents to the ED with a 2-day history of bilateral leg pain.  I reviewed patient's medical record and she was just evaluated on 03/28/2020 at [facility] by Dr. Dagoberto Ligas and her pain medication was refilled.  Patient states that she received the [employer] COVID-19 vaccine approximately 4 to 5 days ago and she read concerns online that they have been contributed to an increased risk of clotting.  She had muscle cramping when she woke up this morning in her calves bilaterally that she described as "charley horses".  She reports that 3 days ago she mowed her lawn for the first time in over 6 months and she uses a Chiropractor.  The following day she felt as though she could not even walk due to her muscle aches.  However, it improved today if not for her "cramping" that went away spontaneously shortly after she woke up.  She denies any history of clots, clotting disorder, fevers or chills, chest pain or difficulty breathing, numbness or weakness, swelling, redness, hormone replacement therapy, recent surgery or immobilization, gait disturbance, or other symptoms.  HPI     Past Medical History:  Diagnosis Date   Anemia    Anxiety    Arthritis    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[place])    left   Cancer ([place])    7 years ago, cancer free now   Depression    Difficulty sleeping    GERD (gastroesophageal reflux disease)    History of kidney stones    History of transfusion    HIV disease ([place])    Kidney stones    Pneumonia     Patient Active Problem List   Diagnosis Date Noted   Avascular necrosis of right femoral head ([place]) 01/29/2020    Surgery, elective    Abscess of left hip    Klebsiella sepsis ([place]) 07/27/2017   Pyelonephritis 07/23/2017   Depression with anxiety 07/23/2017   Acute pyelonephritis    Encounter for imaging study to confirm nasogastric (NG) tube placement    History of left hip replacement 04/06/2016   HIV disease ([place])    CKD (chronic kidney disease) stage 3, GFR 30-59 ml/min    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[place]) 01/24/2016   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[place]) 01/24/2016   Avascular necrosis of bone of left hip ([place]) 10/01/2014   Status post total replacement of left hip 10/01/2014   FB GI (foreign body in gastrointestinal tract) 04/09/2014    Past Surgical History:  Procedure Laterality Date   ANTERIOR HIP REVISION Left 04/06/2016   Procedure: REMOVAL OF TEMPORARY ANTIBIOTIC SPACER LEFT HIP, LEFT TOTAL HIP REVISION;  Surgeon: Mcarthur Rossetti, MD;  [facility];  Service: Orthopedics;  Laterality: Left;   ANTERIOR HIP REVISION Left 08/02/2017   Procedure: Irrigation and debridement left hip with antibiotic bead placement;  Surgeon: Mcarthur Rossetti, MD;  [facility];  Service: Orthopedics;  Laterality: Left;   APPENDECTOMY     COLON SURGERY  2008   COLOSTOMY REVERSAL  2013   EXCISIONAL TOTAL HIP ARTHROPLASTY WITH ANTIBIOTIC SPACERS Left 01/24/2016  Procedure: EXCISION OF LEFT TOTAL HIP ARTHROPLASTY WITH PLACEMENT OF ANTIBIOTIC SPACERS;  Surgeon: Mcarthur Rossetti, MD;  Location: [facility];  Service: Orthopedics;  Laterality: Left;   INCISION AND DRAINAGE HIP Left 01/24/2016   Procedure: IRRIGATION AND DEBRIDEMENT LEFT HIP;  Surgeon: Mcarthur Rossetti, MD;  Location: [facility];  Service: Orthopedics;  Laterality: Left;   KNEE ARTHROSCOPY     left   PORT-A-CATH REMOVAL     TOTAL HIP ARTHROPLASTY Left 10/01/2014   Procedure: LEFT TOTAL HIP ARTHROPLASTY ANTERIOR APPROACH;  Surgeon: Mcarthur Rossetti, MD;  [facility];   Service: Orthopedics;  Laterality: Left;     OB History   No obstetric history on file.     Family History  Problem Relation Age of Onset   Hypertension Father    Diabetes Father    Heart attack Father    Hypertension Other    Diabetes Other     Social History   Tobacco Use   Smoking status: Current Every Day Smoker    Packs/day: 0.50    Types: Cigarettes   Smokeless tobacco: Never Used  Substance Use Topics   Alcohol use: No   Drug use: No    Home Medications Prior to Admission medications   Medication Sig Start Date End Date Taking? Authorizing Provider  ALPRAZolam (XANAX) 0.5 MG tablet TAKE ONE TABLET (0.5 MG TOTAL) BY MOUTH AT BEDTIME AS NEEDED FOR SLEEP. 09/03/17   [provider]  aspirin 81 MG tablet Take by mouth. 06/13/11   [provider]  Calcium Carb-Cholecalciferol 1000-800 MG-UNIT TABS Take by mouth.    [provider]  Calcium Citrate-Vitamin D (CALCIUM + D PO) Take 1 tablet by mouth daily.    [provider]  Calcium Citrate-Vitamin D (CALCIUM CITRATE+D3 PETITES) 200-250 MG-UNIT TABS Take by mouth.    [provider]  citalopram (CELEXA) 20 MG tablet Take 20 mg by mouth every morning.     [provider]  citalopram (CELEXA) 20 MG tablet TAKE 1 TABLET BY MOUTH EVERY DAY 05/23/18   [provider]  dolutegravir (TIVICAY) 50 MG tablet TAKE 1 TABLET BY MOUTH TWICE DAILY 02/03/18   [provider]  emtricitabine-tenofovir AF (DESCOVY) 200-25 MG tablet TAKE ONE TABLET BY MOUTH DAILY. 12/09/17   [provider]  ferrous sulfate 325 (65 FE) MG tablet Take 325 mg by mouth 2 (two) times daily.     [provider]  HYDROcodone-acetaminophen (NORCO) 7.5-325 MG tablet Take 1 tablet by mouth every 6 (six) hours as needed for moderate pain or severe pain. 03/28/20   Lovorn, Jinny Blossom, MD  HYDROcodone-acetaminophen (NORCO) 7.5-325 MG tablet Take 1 tablet by mouth every 6 (six) hours as  needed for moderate pain. 03/28/20   Lovorn, Jinny Blossom, MD  ibuprofen (ADVIL,MOTRIN) 600 MG tablet Take 1 tablet (600 mg total) by mouth every 8 (eight) hours as needed (Body aches, fever). 01/26/19   Ward, Ozella Almond, PA-C  maraviroc (SELZENTRY) 300 MG tablet TAKE 1 TABLET BY MOUTH TWICE DAILY 03/24/18   [provider]  Multiple Vitamin (MULTIVITAMIN WITH MINERALS) TABS tablet Take 1 tablet by mouth daily.    [provider]  tiZANidine (ZANAFLEX) 4 MG tablet TAKE 1 TABLET (4 MG TOTAL) BY MOUTH 2 (TWO) TIMES DAILY AS NEEDED FOR MUSCLE SPASMS. 03/15/20   Mcarthur Rossetti, MD    Allergies    Iodine, Other, Ivp dye [iodinated diagnostic agents], Sulfa antibiotics, and Codeine  Review of Systems  Review of Systems  Constitutional: Negative for fever.  Musculoskeletal: Negative for gait problem.  Skin: Negative for color change and wound.  Neurological: Negative for weakness and numbness.    Physical Exam Updated Vital Signs BP (!) 165/84 (BP Location: Left Arm)    Pulse 82    Temp 98.2 F (36.8 C) (Oral)    Resp 18    Ht 5\' 10"  (1.778 m)    Wt 91.6 kg    SpO2 100%    BMI 28.98 kg/m   Physical Exam Vitals and nursing note reviewed. Exam conducted with a chaperone present.  Constitutional:      Appearance: Normal appearance.  HENT:     Head: Normocephalic and atraumatic.  Eyes:     General: No scleral icterus.    Conjunctiva/sclera: Conjunctivae normal.  Cardiovascular:     Rate and Rhythm: Normal rate and regular rhythm.     Pulses: Normal pulses.     Heart sounds: Normal heart sounds.  Pulmonary:     Effort: Pulmonary effort is normal. No respiratory distress.     Breath sounds: Normal breath sounds.  Musculoskeletal:     Cervical back: Normal range of motion. No rigidity.     Right lower leg: No edema.     Left lower leg: No edema.     Comments: Legs: No tenderness to palpation over the calves, popliteal regions, or hamstrings bilaterally.  No overlying  skin changes.  No redness.  No swelling.  Pedal pulse intact bilaterally.  Sensation intact throughout.  ROM and strength intact.  Skin:    General: Skin is dry.     Capillary Refill: Capillary refill takes less than 2 seconds.  Neurological:     Mental Status: She is alert and oriented to person, place, and time.     GCS: GCS eye subscore is 4. GCS verbal subscore is 5. GCS motor subscore is 6.  Psychiatric:        Mood and Affect: Mood normal.        Behavior: Behavior normal.        Thought Content: Thought content normal.         ED Results / Procedures / Treatments   Labs (all labs ordered are listed, but only abnormal results are displayed) Labs Reviewed  BASIC METABOLIC PANEL - Abnormal; Notable for the following components:      Result Value   Glucose, Bld 126 (*)    Creatinine, Ser 1.22 (*)    GFR calc non Af Amer 47 (*)    GFR calc Af Amer 54 (*)    All other components within normal limits  CK    EKG None  Radiology No results found.  Procedures Procedures (including critical care time)  Medications Ordered in ED Medications - No data to display  ED Course  I have reviewed the triage vital signs and the nursing notes.  Pertinent labs & imaging results that were available during my care of the patient were reviewed by me and considered in my medical decision making (see chart for details).    MDM Rules/Calculators/A&P                      Patient's physical exam is entirely benign.  She feels as though her symptoms improved since she mowed the lawn a couple of days ago.  She did admit that it was a very warm day, 75 F, and that there was potential that she was dehydrated.  This could contribute to her muscle cramping and episode of "charley horse" that occurred this morning.  My physical exam was entirely reassuring.  There is no swelling, redness, tenderness, or other findings concerning for DVT.  She has no history of DVT or clotting disorder.  No  recent surgeries or immobilization.  No hormone replacement therapy.  She is a very low risk and she admits that she is only in here today because of the recent report that the [employer] COVID-19 vaccine causes increased risk of clotting.    I informed her that I had very low suspicion for DVT and do not feel as though ultrasound is warranted at this time.  Obtained CK and BMP to rule out rhabdomyolysis or electrolyte derangement contributing to her cramping symptoms.  Her kidney disease is consistent with her baseline and there are no other significant abnormalities.  She is pleased with today's work-up and understands why ultrasounds not warranted at this time.  She is accompanied by her fianc who also understands and is agreeable with today's work-up.  She also reports that she has Norco and tizanidine at home for her chronic hip discomfort.   Strict ED return precautions discussed.  All of the evaluation and work-up results were discussed with the patient and any family at bedside. They were provided opportunity to ask any additional questions and have none at this time. They have expressed understanding of verbal discharge instructions as well as return precautions and are agreeable to the plan.    Final Clinical Impression(s) / ED Diagnoses Final diagnoses:  Cramp in limb    Rx / DC Orders ED Discharge Orders    None       Corena Herter, PA-C 04/05/20 1516    Blanchie Dessert, MD 04/07/20 2135

## 2020-04-05 NOTE — ED Triage Notes (Signed)
Pt c/o bilat leg pain started 2 days after after "cutting grass" earlier in the day and concerned she had covid vaccine the day before pain started-pt also reports that she "needs a hip replacement on the right side"-slow gait-NAD

## 2020-04-07 ENCOUNTER — Other Ambulatory Visit (INDEPENDENT_AMBULATORY_CARE_PROVIDER_SITE_OTHER): Payer: Self-pay | Admitting: Orthopaedic Surgery

## 2020-04-07 NOTE — Telephone Encounter (Signed)
Please advise? She does have AVN of the hip, but we haven't seen her since 2020

## 2020-04-15 ENCOUNTER — Other Ambulatory Visit (INDEPENDENT_AMBULATORY_CARE_PROVIDER_SITE_OTHER): Payer: Self-pay | Admitting: Orthopaedic Surgery

## 2020-04-15 NOTE — Telephone Encounter (Signed)
Please advise 

## 2020-04-27 ENCOUNTER — Other Ambulatory Visit: Payer: Self-pay

## 2020-04-27 MED ORDER — HYDROCODONE-ACETAMINOPHEN 7.5-325 MG PO TABS: 1.0000 | ORAL_TABLET | Freq: Four times a day (QID) | ORAL | 0 refills | Status: DC | PRN

## 2020-05-24 ENCOUNTER — Other Ambulatory Visit (INDEPENDENT_AMBULATORY_CARE_PROVIDER_SITE_OTHER): Payer: Self-pay | Admitting: Orthopaedic Surgery

## 2020-05-25 ENCOUNTER — Encounter: Payer: Medicare Other | Admitting: Physical Medicine and Rehabilitation

## 2020-05-27 ENCOUNTER — Other Ambulatory Visit: Payer: Self-pay

## 2020-05-27 MED ORDER — HYDROCODONE-ACETAMINOPHEN 7.5-325 MG PO TABS: 1.0000 | ORAL_TABLET | Freq: Four times a day (QID) | ORAL | 0 refills | Status: DC | PRN

## 2020-06-03 ENCOUNTER — Encounter: Payer: Medicare Other | Admitting: Physical Medicine and Rehabilitation

## 2020-06-03 NOTE — Progress Notes (Deleted)
Subjective:    Patient ID: Ashley Shepherd, female    DOB: [birth year], 64 y.o.   MRN: [id number]  HPI  Pain Inventory Average Pain 10 Pain Right Now 10 My pain is burning and aching  In the last 24 hours, has pain interfered with the following? General activity 6 Relation with others 7 Enjoyment of life 7 What TIME of day is your pain at its worst? morning Sleep (in general) Poor  Pain is worse with: walking, bending, sitting, inactivity, standing and some activites Pain improves with: medication Relief from Meds: 6  Mobility walk without assistance do you drive?  no Do you have any goals in this area?  yes  Function disabled: date disabled . retired  Neuro/Psych weakness tingling spasms  Prior Studies Any changes since last visit?  no  Physicians involved in your care Any changes since last visit?  no   Family History  Problem Relation Age of Onset  . Hypertension Father   . Diabetes Father   . Heart attack Father   . Hypertension Other   . Diabetes Other    Social History   Socioeconomic History  . Marital status: Widowed    Spouse name: Not on file  . Number of children: Not on file  . Years of education: Not on file  . Highest education level: Not on file  Occupational History  . Not on file  Tobacco Use  . Smoking status: Current Every Day Smoker    Packs/day: 0.50    Types: Cigarettes  . Smokeless tobacco: Never Used  Vaping Use  . Vaping Use: Never used  Substance and Sexual Activity  . Alcohol use: No  . Drug use: No  . Sexual activity: Not on file  Other Topics Concern  . Not on file  Social History Narrative  . Not on file   Social Determinants of Health   Financial Resource Strain:   . Difficulty of Paying Living Expenses:   Food Insecurity:   . Worried About Charity fundraiser in the Last Year:   . Arboriculturist in the Last Year:   Transportation Needs:   . Film/video editor (Medical):   Marland Kitchen Lack of Transportation  (Non-Medical):   Physical Activity:   . Days of Exercise per Week:   . Minutes of Exercise per Session:   Stress:   . Feeling of Stress :   Social Connections:   . Frequency of Communication with Friends and Family:   . Frequency of Social Gatherings with Friends and Family:   . Attends Religious Services:   . Active Member of Clubs or Organizations:   . Attends Archivist Meetings:   Marland Kitchen Marital Status:    Past Surgical History:  Procedure Laterality Date  . ANTERIOR HIP REVISION Left 04/06/2016   Procedure: REMOVAL OF TEMPORARY ANTIBIOTIC SPACER LEFT HIP, LEFT TOTAL HIP REVISION;  Surgeon: Mcarthur Rossetti, MD;  [facility];  Service: Orthopedics;  Laterality: Left;  . ANTERIOR HIP REVISION Left 08/02/2017   Procedure: Irrigation and debridement left hip with antibiotic bead placement;  Surgeon: Mcarthur Rossetti, MD;  [facility];  Service: Orthopedics;  Laterality: Left;  . APPENDECTOMY    . COLON SURGERY  2008  . COLOSTOMY REVERSAL  2013  . EXCISIONAL TOTAL HIP ARTHROPLASTY WITH ANTIBIOTIC SPACERS Left 01/24/2016   Procedure: EXCISION OF LEFT TOTAL HIP ARTHROPLASTY WITH PLACEMENT OF ANTIBIOTIC SPACERS;  Surgeon: Mcarthur Rossetti, MD;  Location:  [facility];  Service: Orthopedics;  Laterality: Left;  . INCISION AND DRAINAGE HIP Left 01/24/2016   Procedure: IRRIGATION AND DEBRIDEMENT LEFT HIP;  Surgeon: Mcarthur Rossetti, MD;  Location: [facility];  Service: Orthopedics;  Laterality: Left;  . KNEE ARTHROSCOPY     left  . PORT-A-CATH REMOVAL    . TOTAL HIP ARTHROPLASTY Left 10/01/2014   Procedure: LEFT TOTAL HIP ARTHROPLASTY ANTERIOR APPROACH;  Surgeon: Mcarthur Rossetti, MD;  [facility];  Service: Orthopedics;  Laterality: Left;   Past Medical History:  Diagnosis Date  . Anemia   . Anxiety   . Arthritis   . 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[place])    left  . Cancer ([place])    7 years ago, cancer free now  . Depression   . Difficulty sleeping   .  GERD (gastroesophageal reflux disease)   . History of kidney stones   . History of transfusion   . HIV disease ([place])   . Kidney stones   . Pneumonia    There were no vitals taken for this visit.  Opioid Risk Score:   Fall Risk Score:  `1  Depression screen PHQ 2/9  Depression screen [facility] 2/9 06/03/2020 01/29/2020 04/08/2018 01/21/2018 10/29/2017 09/24/2017 03/06/2016  Decreased Interest 0 0 0 0 0 0 0  Down, Depressed, Hopeless 0 0 0 0 0 0 0  PHQ - 2 Score 0 0 0 0 0 0 0  Altered sleeping - 2 - - - - -  Tired, decreased energy - 1 - - - - -  Change in appetite - 0 - - - - -  Feeling bad or failure about yourself  - 0 - - - - -  Trouble concentrating - 0 - - - - -  Moving slowly or fidgety/restless - 0 - - - - -  Suicidal thoughts - 0 - - - - -  PHQ-9 Score - 3 - - - - -    Review of Systems     Objective:   Physical Exam        Assessment & Plan:

## 2020-06-15 ENCOUNTER — Other Ambulatory Visit (INDEPENDENT_AMBULATORY_CARE_PROVIDER_SITE_OTHER): Payer: Self-pay | Admitting: Orthopaedic Surgery

## 2020-06-15 NOTE — Telephone Encounter (Signed)
Please advise, hasn't been seen since 06/2019

## 2020-06-22 ENCOUNTER — Ambulatory Visit: Payer: Medicare Other | Admitting: Physical Medicine and Rehabilitation

## 2020-06-27 ENCOUNTER — Other Ambulatory Visit: Payer: Self-pay

## 2020-06-28 MED ORDER — HYDROCODONE-ACETAMINOPHEN 7.5-325 MG PO TABS: 1.0000 | ORAL_TABLET | Freq: Four times a day (QID) | ORAL | 0 refills | Status: DC | PRN

## 2020-06-28 NOTE — Telephone Encounter (Signed)
Dr. Dagoberto Ligas is on vacation

## 2020-07-04 ENCOUNTER — Encounter
Payer: Medicare Other | Attending: Physical Medicine and Rehabilitation | Admitting: Physical Medicine and Rehabilitation

## 2020-07-04 ENCOUNTER — Encounter: Payer: Self-pay | Admitting: Physical Medicine and Rehabilitation

## 2020-07-04 ENCOUNTER — Other Ambulatory Visit: Payer: Self-pay

## 2020-07-04 VITALS — BP 124/81 | HR 64 | Temp 98.9°F | Ht 70.0 in | Wt 198.6 lb

## 2020-07-04 DIAGNOSIS — Z79891 Long term (current) use of opiate analgesic: Secondary | ICD-10-CM | POA: Insufficient documentation

## 2020-07-04 DIAGNOSIS — M87052 Idiopathic aseptic necrosis of left femur: Secondary | ICD-10-CM | POA: Diagnosis present

## 2020-07-04 DIAGNOSIS — Z5181 Encounter for therapeutic drug level monitoring: Secondary | ICD-10-CM | POA: Diagnosis not present

## 2020-07-04 DIAGNOSIS — Z96642 Presence of left artificial hip joint: Secondary | ICD-10-CM | POA: Insufficient documentation

## 2020-07-04 DIAGNOSIS — G894 Chronic pain syndrome: Secondary | ICD-10-CM | POA: Insufficient documentation

## 2020-07-04 DIAGNOSIS — M87051 Idiopathic aseptic necrosis of right femur: Secondary | ICD-10-CM | POA: Insufficient documentation

## 2020-07-04 MED ORDER — CYCLOBENZAPRINE HCL 10 MG PO TABS: 10.0000 mg | ORAL_TABLET | Freq: Every day | ORAL | 5 refills | Status: DC

## 2020-07-04 NOTE — Patient Instructions (Signed)
Patient I a 64 yr old female HIV- undetectable for 9 years; no other major issues except B/L hip DJD and s/p L THR with infection noted s/p surgery- Had ear infection of L ear and then had 3 surgeries of L hip. Hx of anterior hip replacements on L   1. Stop Tizanidine 2. Try Cyclobenzaprine/Flexeril- 10- 15 mg nightly for muscle spasms.   3. If this doesn't work, call me and will try Skelaxin/Metaxalone for might tightness/spasms.   4. Got Norco on 7/6- has 3 weeks supply left- so will wait to refill.   5. Has been thinking about R hip replacement.   6. F/U in 3 months- if doing well. If need be, can see earlier.  Call me if Flexeril doesn't work-

## 2020-07-04 NOTE — Progress Notes (Signed)
Subjective:    Patient ID: Ashley Shepherd, female    DOB: June 02, 1956, 64 y.o.   MRN: [id number]  HPI  Patient I a 64 yr old female HIV- undetectable for 9 years; no other major issues except B/L hip DJD and s/p L THR with infection noted s/p surgery- Had ear infection of L ear and then had 3 surgeries of L hip. Hx of anterior hip replacements on L    A lot of bad muscle spasms- esp at night- still hurting overall,, but muscle spasms are so bad, can't sleep.  Taking Zanaflex/tizanidine 4 mg before goes to bed. Jumping and kicking husband- the leg is jumping, and also jumps some because of the pain.  Muscle spasms are what's the main problems right now.    Never tried any other muscle spasm medicine.    Scared of addiction- will take 1 whole pill, then 2nd time 1/2 pill  Makes her itch if takes Norco at night- but doesn't during day.    Pain Inventory Average Pain 6 Pain Right Now 6 My pain is sharp, stabbing and aching  In the last 24 hours, has pain interfered with the following? General activity 4 Relation with others 0 Enjoyment of life 2 What TIME of day is your pain at its worst? morning night Sleep (in general) Fair  Pain is worse with: walking, bending, sitting and inactivity Pain improves with: medication Relief from Meds: 5  Mobility walk without assistance how many minutes can you walk? 6  Function not employed: date last employed . disabled: date disabled .  Neuro/Psych weakness trouble walking spasms anxiety  Prior Studies Any changes since last visit?  no x-rays  Physicians involved in your care Primary care .   Family History  Problem Relation Age of Onset  . Hypertension Father   . Diabetes Father   . Heart attack Father   . Hypertension Other   . Diabetes Other    Social History   Socioeconomic History  . Marital status: Widowed    Spouse name: Not on file  . Number of children: Not on file  . Years of education: Not on file    . Highest education level: Not on file  Occupational History  . Not on file  Tobacco Use  . Smoking status: Current Every Day Smoker    Packs/day: 0.50    Types: Cigarettes  . Smokeless tobacco: Never Used  Vaping Use  . Vaping Use: Never used  Substance and Sexual Activity  . Alcohol use: No  . Drug use: No  . Sexual activity: Not on file  Other Topics Concern  . Not on file  Social History Narrative  . Not on file   Social Determinants of Health   Financial Resource Strain:   . Difficulty of Paying Living Expenses:   Food Insecurity:   . Worried About Charity fundraiser in the Last Year:   . Arboriculturist in the Last Year:   Transportation Needs:   . Film/video editor (Medical):   Marland Kitchen Lack of Transportation (Non-Medical):   Physical Activity:   . Days of Exercise per Week:   . Minutes of Exercise per Session:   Stress:   . Feeling of Stress :   Social Connections:   . Frequency of Communication with Friends and Family:   . Frequency of Social Gatherings with Friends and Family:   . Attends Religious Services:   . Active Member of Clubs or  Organizations:   . Attends Archivist Meetings:   Marland Kitchen Marital Status:    Past Surgical History:  Procedure Laterality Date  . ANTERIOR HIP REVISION Left 04/06/2016   Procedure: REMOVAL OF TEMPORARY ANTIBIOTIC SPACER LEFT HIP, LEFT TOTAL HIP REVISION;  Surgeon: Mcarthur Rossetti, MD;  [facility];  Service: Orthopedics;  Laterality: Left;  . ANTERIOR HIP REVISION Left 08/02/2017   Procedure: Irrigation and debridement left hip with antibiotic bead placement;  Surgeon: Mcarthur Rossetti, MD;  [facility];  Service: Orthopedics;  Laterality: Left;  . APPENDECTOMY    . COLON SURGERY  2008  . COLOSTOMY REVERSAL  2013  . EXCISIONAL TOTAL HIP ARTHROPLASTY WITH ANTIBIOTIC SPACERS Left 01/24/2016   Procedure: EXCISION OF LEFT TOTAL HIP ARTHROPLASTY WITH PLACEMENT OF ANTIBIOTIC SPACERS;  Surgeon:  Mcarthur Rossetti, MD;  Location: [facility];  Service: Orthopedics;  Laterality: Left;  . INCISION AND DRAINAGE HIP Left 01/24/2016   Procedure: IRRIGATION AND DEBRIDEMENT LEFT HIP;  Surgeon: Mcarthur Rossetti, MD;  Location: [facility];  Service: Orthopedics;  Laterality: Left;  . KNEE ARTHROSCOPY     left  . PORT-A-CATH REMOVAL    . TOTAL HIP ARTHROPLASTY Left 10/01/2014   Procedure: LEFT TOTAL HIP ARTHROPLASTY ANTERIOR APPROACH;  Surgeon: Mcarthur Rossetti, MD;  [facility];  Service: Orthopedics;  Laterality: Left;   Past Medical History:  Diagnosis Date  . Anemia   . Anxiety   . Arthritis   . 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[place])    left  . Cancer ([place])    7 years ago, cancer free now  . Depression   . Difficulty sleeping   . GERD (gastroesophageal reflux disease)   . History of kidney stones   . History of transfusion   . HIV disease ([place])   . Kidney stones   . Pneumonia    BP 124/81   Pulse 64   Temp 98.9 F (37.2 C)   Ht 5\' 10"  (1.778 m)   Wt 198 lb 9.6 oz (90.1 kg)   SpO2 96%   BMI 28.50 kg/m   Opioid Risk Score:   Fall Risk Score:  `1  Depression screen PHQ 2/9  Depression screen [facility] 2/9 06/03/2020 01/29/2020 04/08/2018 01/21/2018 10/29/2017 09/24/2017 03/06/2016  Decreased Interest 0 0 0 0 0 0 0  Down, Depressed, Hopeless 0 0 0 0 0 0 0  PHQ - 2 Score 0 0 0 0 0 0 0  Altered sleeping - 2 - - - - -  Tired, decreased energy - 1 - - - - -  Change in appetite - 0 - - - - -  Feeling bad or failure about yourself  - 0 - - - - -  Trouble concentrating - 0 - - - - -  Moving slowly or fidgety/restless - 0 - - - - -  Suicidal thoughts - 0 - - - - -  PHQ-9 Score - 3 - - - - -    Review of Systems  Musculoskeletal: Positive for gait problem.       Spasms  Neurological: Positive for weakness.  Psychiatric/Behavioral: The patient is nervous/anxious.   All other systems reviewed and are negative.      Objective:   Physical Exam  Awake, alert, accompanied by  husband, NAD No clonus or Hoffman's B/L- no increased tone or spasms seen Still TTP over R groin secondary to end stage DJD of R hip     Assessment &  Plan:   Patient I a 64 yr old female HIV- undetectable for 9 years; no other major issues except B/L hip DJD and s/p L THR with infection noted s/p surgery- Had ear infection of L ear and then had 3 surgeries of L hip. Hx of anterior hip replacements on L   1. Stop Tizanidine 2. Try Cyclobenzaprine/Flexeril- 10- 15 mg nightly for muscle spasms.   3. If this doesn't work, call me and will try Skelaxin/Metaxalone for might tightness/spasms.   4. Got Norco on 7/6- has 3 weeks supply left- so will wait to refill.   5. Has been thinking about R hip replacement.   6. F/U in 3 months- if doing well. If need be, can see earlier.  Call me if Flexeril doesn't work-     I spent a total of 25 minutes on appointment total- as detailed above.

## 2020-07-05 NOTE — Progress Notes (Signed)
This encounter was created in error - please disregard.

## 2020-07-07 LAB — TOXASSURE SELECT,+ANTIDEPR,UR

## 2020-07-08 ENCOUNTER — Other Ambulatory Visit (INDEPENDENT_AMBULATORY_CARE_PROVIDER_SITE_OTHER): Payer: Self-pay | Admitting: Orthopaedic Surgery

## 2020-07-19 ENCOUNTER — Telehealth: Payer: Self-pay | Admitting: *Deleted

## 2020-07-19 NOTE — Telephone Encounter (Signed)
OK- I'll discuss it with her.

## 2020-07-19 NOTE — Telephone Encounter (Signed)
Urine drug screen is positive for prescribed hydrocodone.  Zolpidem is present and per the PMP there is not an Rx for that medication in the past 2 yrs.

## 2020-07-29 ENCOUNTER — Other Ambulatory Visit: Payer: Self-pay

## 2020-07-29 MED ORDER — HYDROCODONE-ACETAMINOPHEN 7.5-325 MG PO TABS: 1.0000 | ORAL_TABLET | Freq: Four times a day (QID) | ORAL | 0 refills | Status: DC | PRN

## 2020-07-29 NOTE — Telephone Encounter (Signed)
Please advise or prescribe. Dr. Dagoberto Ligas on [place] today.  Thank you

## 2020-07-31 ENCOUNTER — Other Ambulatory Visit (INDEPENDENT_AMBULATORY_CARE_PROVIDER_SITE_OTHER): Payer: Self-pay | Admitting: Orthopaedic Surgery

## 2020-08-01 NOTE — Telephone Encounter (Signed)
Please advise 

## 2020-08-23 ENCOUNTER — Other Ambulatory Visit (INDEPENDENT_AMBULATORY_CARE_PROVIDER_SITE_OTHER): Payer: Self-pay | Admitting: Orthopaedic Surgery

## 2020-08-23 NOTE — Telephone Encounter (Signed)
Please advise 

## 2020-08-29 ENCOUNTER — Other Ambulatory Visit: Payer: Self-pay

## 2020-08-30 MED ORDER — HYDROCODONE-ACETAMINOPHEN 7.5-325 MG PO TABS: 1.0000 | ORAL_TABLET | Freq: Four times a day (QID) | ORAL | 0 refills | Status: DC | PRN

## 2020-09-01 ENCOUNTER — Other Ambulatory Visit (INDEPENDENT_AMBULATORY_CARE_PROVIDER_SITE_OTHER): Payer: Self-pay | Admitting: Orthopaedic Surgery

## 2020-09-01 NOTE — Telephone Encounter (Signed)
Please advise 

## 2020-09-14 MED ORDER — CYCLOBENZAPRINE HCL 10 MG PO TABS: 20.0000 mg | ORAL_TABLET | Freq: Three times a day (TID) | ORAL | 5 refills | Status: DC | PRN

## 2020-09-14 NOTE — Telephone Encounter (Signed)
So let's do 20 mg try 2x/day- but can do 3x/day if needed. I sent in to [facility] in [facility].

## 2020-09-14 NOTE — Addendum Note (Signed)
Addended by: Courtney Heys on: 09/14/2020 09:26 AM   Modules accepted: Orders

## 2020-09-28 ENCOUNTER — Other Ambulatory Visit: Payer: Self-pay

## 2020-09-28 MED ORDER — HYDROCODONE-ACETAMINOPHEN 7.5-325 MG PO TABS
1.0000 | ORAL_TABLET | Freq: Four times a day (QID) | ORAL | 0 refills | Status: DC | PRN
Start: 2020-09-28 — End: 2020-10-28

## 2020-10-05 ENCOUNTER — Other Ambulatory Visit: Payer: Self-pay

## 2020-10-05 ENCOUNTER — Encounter
Payer: Medicare Other | Attending: Physical Medicine and Rehabilitation | Admitting: Physical Medicine and Rehabilitation

## 2020-10-05 ENCOUNTER — Encounter: Payer: Self-pay | Admitting: Physical Medicine and Rehabilitation

## 2020-10-05 VITALS — BP 136/82 | HR 80 | Temp 98.2°F | Ht 70.0 in | Wt 195.2 lb

## 2020-10-05 DIAGNOSIS — M87051 Idiopathic aseptic necrosis of right femur: Secondary | ICD-10-CM | POA: Insufficient documentation

## 2020-10-05 DIAGNOSIS — Z96642 Presence of left artificial hip joint: Secondary | ICD-10-CM | POA: Insufficient documentation

## 2020-10-05 MED ORDER — TIZANIDINE HCL 4 MG PO TABS: 4.0000 mg | ORAL_TABLET | Freq: Three times a day (TID) | ORAL | 5 refills | Status: DC | PRN

## 2020-10-05 NOTE — Patient Instructions (Signed)
  1. Change Flexeril toTizanidine  - change Rx to 4 mg 3x/day as needed, or CAN take up to 8 mg at bedtime and can take another 4 mg during day.   2. Magnesium- 400 mg- start with 1 tab but can increase to 3x/day- only side effect is looser stools, so can balance out constipation from pain meds. Don't take as needed, take every day.    3. Just refilled pain meds on 10/6, so not due yet.  Will call me when it's due to be refilled.    4. Con't pain meds-  Planning on doing hip replacement before birthday which is March 2022.  Wants to walk better- when sits down for awhile, stiffens up.    5. Present to self for Christmas, schedule surgery.   6. F/U - 3 months- earlier if need be.

## 2020-10-05 NOTE — Progress Notes (Signed)
Subjective:    Patient ID: Ashley Shepherd, female    DOB: [birth year], 64 y.o.   MRN: [id number]  HPI   Patient I a 64 yr old female HIV- undetectable for 9 years; no other major issues except B/L hip DJD and s/p L THR with infection noted s/p surgery- Had ear infection of L ear and then had 3 surgeries of L hip. Hx of anterior hip replacements on L   Flexeril increased dose didn't help.   Helped a little, but not much.  When trying to sleep at night, bother her the most.  Zanaflex worked better than Flexeril.  So willing to go back to That.   RLE pain, not just R hip pain- due to R hip DJD.      Pain Inventory Average Pain 8 Pain Right Now 8 My pain is sharp, burning, stabbing and aching  In the last 24 hours, has pain interfered with the following? General activity 5 Relation with others 5 Enjoyment of life 6 What TIME of day is your pain at its worst? morning  and night Sleep (in general) Fair  Pain is worse with: walking, bending, inactivity, standing and some activites Pain improves with: medication Relief from Meds: 7  Family History  Problem Relation Age of Onset   Hypertension Father    Diabetes Father    Heart attack Father    Hypertension Other    Diabetes Other    Social History   Socioeconomic History   Marital status: Widowed    Spouse name: Not on file   Number of children: Not on file   Years of education: Not on file   Highest education level: Not on file  Occupational History   Not on file  Tobacco Use   Smoking status: Current Every Day Smoker    Packs/day: 0.50    Types: Cigarettes   Smokeless tobacco: Never Used  Vaping Use   Vaping Use: Never used  Substance and Sexual Activity   Alcohol use: No   Drug use: No   Sexual activity: Not on file  Other Topics Concern   Not on file  Social History Narrative   Not on file   Social Determinants of Health   Financial Resource Strain:    Difficulty of Paying  Living Expenses: Not on file  Food Insecurity:    Worried About [place] in the Last Year: Not on file   Ran Out of Food in the Last Year: Not on file  Transportation Needs:    Lack of Transportation (Medical): Not on file   Lack of Transportation (Non-Medical): Not on file  Physical Activity:    Days of Exercise per Week: Not on file   Minutes of Exercise per Session: Not on file  Stress:    Feeling of Stress : Not on file  Social Connections:    Frequency of Communication with Friends and Family: Not on file   Frequency of Social Gatherings with Friends and Family: Not on file   Attends Religious Services: Not on file   Active Member of Clubs or Organizations: Not on file   Attends Archivist Meetings: Not on file   Marital Status: Not on file   Past Surgical History:  Procedure Laterality Date   ANTERIOR HIP REVISION Left 04/06/2016   Procedure: REMOVAL OF TEMPORARY ANTIBIOTIC SPACER LEFT HIP, LEFT TOTAL HIP REVISION;  Surgeon: Mcarthur Rossetti, MD;  Location: [facility];  Service: Orthopedics;  Laterality:  Left;   ANTERIOR HIP REVISION Left 08/02/2017   Procedure: Irrigation and debridement left hip with antibiotic bead placement;  Surgeon: Mcarthur Rossetti, MD;  Location: [facility];  Service: Orthopedics;  Laterality: Left;   APPENDECTOMY     COLON SURGERY  2008   COLOSTOMY REVERSAL  2013   EXCISIONAL TOTAL HIP ARTHROPLASTY WITH ANTIBIOTIC SPACERS Left 01/24/2016   Procedure: EXCISION OF LEFT TOTAL HIP ARTHROPLASTY WITH PLACEMENT OF ANTIBIOTIC SPACERS;  Surgeon: Mcarthur Rossetti, MD;  Location: [place];  Service: Orthopedics;  Laterality: Left;   INCISION AND DRAINAGE HIP Left 01/24/2016   Procedure: IRRIGATION AND DEBRIDEMENT LEFT HIP;  Surgeon: Mcarthur Rossetti, MD;  Location: [place];  Service: Orthopedics;  Laterality: Left;   KNEE ARTHROSCOPY     left   PORT-A-CATH REMOVAL     TOTAL HIP ARTHROPLASTY Left 10/01/2014    Procedure: LEFT TOTAL HIP ARTHROPLASTY ANTERIOR APPROACH;  Surgeon: Mcarthur Rossetti, MD;  [facility]: [facility];  Service: Orthopedics;  Laterality: Left;   Past Surgical History:  Procedure Laterality Date   ANTERIOR HIP REVISION Left 04/06/2016   Procedure: REMOVAL OF TEMPORARY ANTIBIOTIC SPACER LEFT HIP, LEFT TOTAL HIP REVISION;  Surgeon: Mcarthur Rossetti, MD;  [facility];  Service: Orthopedics;  Laterality: Left;   ANTERIOR HIP REVISION Left 08/02/2017   Procedure: Irrigation and debridement left hip with antibiotic bead placement;  Surgeon: Mcarthur Rossetti, MD;  [facility];  Service: Orthopedics;  Laterality: Left;   APPENDECTOMY     COLON SURGERY  2008   COLOSTOMY REVERSAL  2013   EXCISIONAL TOTAL HIP ARTHROPLASTY WITH ANTIBIOTIC SPACERS Left 01/24/2016   Procedure: EXCISION OF LEFT TOTAL HIP ARTHROPLASTY WITH PLACEMENT OF ANTIBIOTIC SPACERS;  Surgeon: Mcarthur Rossetti, MD;  Location: [place];  Service: Orthopedics;  Laterality: Left;   INCISION AND DRAINAGE HIP Left 01/24/2016   Procedure: IRRIGATION AND DEBRIDEMENT LEFT HIP;  Surgeon: Mcarthur Rossetti, MD;  Location: [place];  Service: Orthopedics;  Laterality: Left;   KNEE ARTHROSCOPY     left   PORT-A-CATH REMOVAL     TOTAL HIP ARTHROPLASTY Left 10/01/2014   Procedure: LEFT TOTAL HIP ARTHROPLASTY ANTERIOR APPROACH;  Surgeon: Mcarthur Rossetti, MD;  [facility];  Service: Orthopedics;  Laterality: Left;   Past Medical History:  Diagnosis Date   Anemia    Anxiety    Arthritis    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[place])    left   Cancer ([place])    7 years ago, cancer free now   Depression    Difficulty sleeping    GERD (gastroesophageal reflux disease)    History of kidney stones    History of transfusion    HIV disease (H[facility])    Kidney stones    Pneumonia    BP 136/82    Pulse 80    Temp 98.2 F (36.8 C)    Ht 5\' 10"  (1.778 m)    Wt 195 lb 3.2 oz (88.5 kg)    SpO2 97%     BMI 28.01 kg/m   Opioid Risk Score:   Fall Risk Score:  `1  Depression screen PHQ 2/9  Depression screen [facility] 2/9 06/03/2020 01/29/2020 04/08/2018 01/21/2018 10/29/2017 09/24/2017 03/06/2016  Decreased Interest 0 0 0 0 0 0 0  Down, Depressed, Hopeless 0 0 0 0 0 0 0  PHQ - 2 Score 0 0 0 0 0 0 0  Altered sleeping - 2 - - - - -  Tired, decreased energy - 1 - - - - -  Change in appetite - 0 - - - - -  Feeling bad or failure about yourself  - 0 - - - - -  Trouble concentrating - 0 - - - - -  Moving slowly or fidgety/restless - 0 - - - - -  Suicidal thoughts - 0 - - - - -  PHQ-9 Score - 3 - - - - -    Review of Systems  Musculoskeletal: Positive for gait problem.       Spasms  Neurological: Positive for weakness.  All other systems reviewed and are negative.      Objective:   Physical Exam  Awake, alert, appropriate, accompanied by husband, NAD Sitting on table- stiff with prolonged sitting TTP over the groin and lateral hip        Assessment & Plan:   1. Change Flexeril toTizanidine  - change Rx to 4 mg 3x/day as needed, or CAN take up to 8 mg at bedtime and can take another 4 mg during day.   2. Magnesium- 400 mg- start with 1 tab but can increase to 3x/day- only side effect is looser stools, so can balance out constipation from pain meds. Don't take as needed, take every day.    3. Just refilled pain meds on 10/6, so not due yet.  Will call me when it's due to be refilled.    4. Con't pain meds-  Planning on doing hip replacement before birthday which is March 2022.  Wants to walk better- when sits down for awhile, stiffens up.    5. Present to self for Christmas, schedule surgery.   6. F/U - 3 months- earlier if need be.    I spent a total of 25 minutes on appointment- as detailed above.

## 2020-10-10 ENCOUNTER — Other Ambulatory Visit (INDEPENDENT_AMBULATORY_CARE_PROVIDER_SITE_OTHER): Payer: Self-pay | Admitting: Orthopaedic Surgery

## 2020-10-10 NOTE — Telephone Encounter (Signed)
Patient just received 390 with 5 refills from another provider. Refused refill.

## 2020-10-28 ENCOUNTER — Other Ambulatory Visit: Payer: Self-pay

## 2020-10-28 MED ORDER — HYDROCODONE-ACETAMINOPHEN 7.5-325 MG PO TABS
1.0000 | ORAL_TABLET | Freq: Four times a day (QID) | ORAL | 0 refills | Status: DC | PRN
Start: 2020-10-28 — End: 2020-11-28

## 2020-11-28 ENCOUNTER — Other Ambulatory Visit: Payer: Self-pay

## 2020-11-28 MED ORDER — HYDROCODONE-ACETAMINOPHEN 7.5-325 MG PO TABS
1.0000 | ORAL_TABLET | Freq: Four times a day (QID) | ORAL | 0 refills | Status: DC | PRN
Start: 2020-11-28 — End: 2020-12-29

## 2020-12-29 ENCOUNTER — Other Ambulatory Visit: Payer: Self-pay

## 2020-12-29 MED ORDER — HYDROCODONE-ACETAMINOPHEN 7.5-325 MG PO TABS
1.0000 | ORAL_TABLET | Freq: Four times a day (QID) | ORAL | 0 refills | Status: DC | PRN
Start: 2020-12-29 — End: 2021-01-04

## 2021-01-04 ENCOUNTER — Encounter: Payer: Self-pay | Admitting: *Deleted

## 2021-01-04 ENCOUNTER — Other Ambulatory Visit: Payer: Self-pay

## 2021-01-04 ENCOUNTER — Encounter
Payer: Medicare Other | Attending: Physical Medicine and Rehabilitation | Admitting: Physical Medicine and Rehabilitation

## 2021-01-04 ENCOUNTER — Encounter: Payer: Self-pay | Admitting: Physical Medicine and Rehabilitation

## 2021-01-04 VITALS — BP 152/90 | HR 80 | Temp 98.3°F | Ht 70.0 in | Wt 192.8 lb

## 2021-01-04 DIAGNOSIS — M1651 Unilateral post-traumatic osteoarthritis, right hip: Secondary | ICD-10-CM

## 2021-01-04 DIAGNOSIS — Z5181 Encounter for therapeutic drug level monitoring: Secondary | ICD-10-CM | POA: Insufficient documentation

## 2021-01-04 DIAGNOSIS — Z79891 Long term (current) use of opiate analgesic: Secondary | ICD-10-CM | POA: Insufficient documentation

## 2021-01-04 DIAGNOSIS — M87051 Idiopathic aseptic necrosis of right femur: Secondary | ICD-10-CM | POA: Diagnosis present

## 2021-01-04 DIAGNOSIS — G894 Chronic pain syndrome: Secondary | ICD-10-CM

## 2021-01-04 MED ORDER — HYDROCODONE-ACETAMINOPHEN 10-325 MG PO TABS
1.0000 | ORAL_TABLET | Freq: Four times a day (QID) | ORAL | 0 refills | Status: DC | PRN
Start: 2021-01-04 — End: 2021-02-03

## 2021-01-04 NOTE — Addendum Note (Signed)
Addended by: Caro Hight on: 01/04/2021 09:46 AM   Modules accepted: Orders

## 2021-01-04 NOTE — Progress Notes (Signed)
Subjective:    Patient ID: Ashley Shepherd, female    DOB: 1956/01/01, 65 y.o.   MRN: [id number]  HPI Patient I a 65 yr old female HIV- undetectable for 9 years; no other major issues except B/L hip DJD and s/p L THR with infection noted s/p surgery- Had ear infection of L ear and then had 3 surgeries of L hip. Hx of anterior hip replacements on L Needs R THR- but hasn't scheduled it.    Zanaflex hasn't helped any more with muscle spasms- takes 8 mg before goes to sleep, and still having muscle spasms- of R hip and thigh- down to R knee.   Can sometimes put a R knee brace on and helps pain a little bit.   Plans on calling (hasn't scheduled due to increase in COVID cases)-  Hurts so much due to cold/frigid temperatures.   Didn't try Magnesium- forgot all about it!    Pain Inventory Average Pain 9 Pain Right Now 9 My pain is burning, stabbing and aching  In the last 24 hours, has pain interfered with the following? General activity 6 Relation with others 5 Enjoyment of life 5 What TIME of day is your pain at its worst? morning  and evening Sleep (in general) NA  Pain is worse with: walking, bending, inactivity and some activites Pain improves with: medication Relief from Meds: 6  Family History  Problem Relation Age of Onset  . Hypertension Father   . Diabetes Father   . Heart attack Father   . Hypertension Other   . Diabetes Other    Social History   Socioeconomic History  . Marital status: Widowed    Spouse name: Not on file  . Number of children: Not on file  . Years of education: Not on file  . Highest education level: Not on file  Occupational History  . Not on file  Tobacco Use  . Smoking status: Current Every Day Smoker    Packs/day: 0.50    Types: Cigarettes  . Smokeless tobacco: Never Used  Vaping Use  . Vaping Use: Never used  Substance and Sexual Activity  . Alcohol use: No  . Drug use: No  . Sexual activity: Not on file  Other Topics Concern   . Not on file  Social History Narrative  . Not on file   Social Determinants of Health   Financial Resource Strain: Not on file  Food Insecurity: Not on file  Transportation Needs: Not on file  Physical Activity: Not on file  Stress: Not on file  Social Connections: Not on file   Past Surgical History:  Procedure Laterality Date  . ANTERIOR HIP REVISION Left 04/06/2016   Procedure: REMOVAL OF TEMPORARY ANTIBIOTIC SPACER LEFT HIP, LEFT TOTAL HIP REVISION;  Surgeon: Kathryne Hitch, MD;  [facility];  Service: Orthopedics;  Laterality: Left;  . ANTERIOR HIP REVISION Left 08/02/2017   Procedure: Irrigation and debridement left hip with antibiotic bead placement;  Surgeon: Kathryne Hitch, MD;  [facility];  Service: Orthopedics;  Laterality: Left;  . APPENDECTOMY    . COLON SURGERY  2008  . COLOSTOMY REVERSAL  2013  . EXCISIONAL TOTAL HIP ARTHROPLASTY WITH ANTIBIOTIC SPACERS Left 01/24/2016   Procedure: EXCISION OF LEFT TOTAL HIP ARTHROPLASTY WITH PLACEMENT OF ANTIBIOTIC SPACERS;  Surgeon: Kathryne Hitch, MD;  Location[facility];  Service: Orthopedics;  Laterality: Left;  . INCISION AND DRAINAGE HIP Left 01/24/2016   Procedure: IRRIGATION AND DEBRIDEMENT LEFT  HIP;  Surgeon: Mcarthur Rossetti, MD;  Location: [place];  Service: Orthopedics;  Laterality: Left;  . KNEE ARTHROSCOPY     left  . PORT-A-CATH REMOVAL    . TOTAL HIP ARTHROPLASTY Left 10/01/2014   Procedure: LEFT TOTAL HIP ARTHROPLASTY ANTERIOR APPROACH;  Surgeon: Mcarthur Rossetti, MD;  [facility];  Service: Orthopedics;  Laterality: Left;   Past Surgical History:  Procedure Laterality Date  . ANTERIOR HIP REVISION Left 04/06/2016   Procedure: REMOVAL OF TEMPORARY ANTIBIOTIC SPACER LEFT HIP, LEFT TOTAL HIP REVISION;  Surgeon: Mcarthur Rossetti, MD;  [facility];  Service: Orthopedics;  Laterality: Left;  . ANTERIOR HIP REVISION Left 08/02/2017   Procedure: Irrigation and  debridement left hip with antibiotic bead placement;  Surgeon: Mcarthur Rossetti, MD;  [facility];  Service: Orthopedics;  Laterality: Left;  . APPENDECTOMY    . COLON SURGERY  2008  . COLOSTOMY REVERSAL  2013  . EXCISIONAL TOTAL HIP ARTHROPLASTY WITH ANTIBIOTIC SPACERS Left 01/24/2016   Procedure: EXCISION OF LEFT TOTAL HIP ARTHROPLASTY WITH PLACEMENT OF ANTIBIOTIC SPACERS;  Surgeon: Mcarthur Rossetti, MD;  Location: [place];  Service: Orthopedics;  Laterality: Left;  . INCISION AND DRAINAGE HIP Left 01/24/2016   Procedure: IRRIGATION AND DEBRIDEMENT LEFT HIP;  Surgeon: Mcarthur Rossetti, MD;  Location: [place];  Service: Orthopedics;  Laterality: Left;  . KNEE ARTHROSCOPY     left  . PORT-A-CATH REMOVAL    . TOTAL HIP ARTHROPLASTY Left 10/01/2014   Procedure: LEFT TOTAL HIP ARTHROPLASTY ANTERIOR APPROACH;  Surgeon: Mcarthur Rossetti, MD;  [facility];  Service: Orthopedics;  Laterality: Left;   Past Medical History:  Diagnosis Date  . Anemia   . Anxiety   . Arthritis   . 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[place])    left  . Cancer ([place])    7 years ago, cancer free now  . Depression   . Difficulty sleeping   . GERD (gastroesophageal reflux disease)   . History of kidney stones   . History of transfusion   . HIV disease ([place])   . Kidney stones   . Pneumonia    BP (!) 152/90   Pulse 80   Temp 98.3 F (36.8 C)   Ht 5\' 10"  (1.778 m)   Wt 192 lb 12.8 oz (87.5 kg)   SpO2 98%   BMI 27.66 kg/m   Opioid Risk Score:   Fall Risk Score:  `1  Depression screen PHQ 2/9  Depression screen [facility] 2/9 01/04/2021 06/03/2020 01/29/2020 04/08/2018 01/21/2018 10/29/2017 09/24/2017  Decreased Interest 0 0 0 0 0 0 0  Down, Depressed, Hopeless 0 0 0 0 0 0 0  PHQ - 2 Score 0 0 0 0 0 0 0  Altered sleeping - - 2 - - - -  Tired, decreased energy - - 1 - - - -  Change in appetite - - 0 - - - -  Feeling bad or failure about yourself  - - 0 - - - -  Trouble concentrating - - 0 - - - -   Moving slowly or fidgety/restless - - 0 - - - -  Suicidal thoughts - - 0 - - - -  PHQ-9 Score - - 3 - - - -    Review of Systems  Constitutional: Negative.   HENT: Negative.   Eyes: Negative.   Respiratory: Negative.   Cardiovascular: Negative.   Gastrointestinal: Negative.   Endocrine: Negative.   Genitourinary:  Negative.   Musculoskeletal:       Right leg pain  Skin: Negative.   Allergic/Immunologic: Negative.   Neurological: Negative.   Hematological: Negative.   Psychiatric/Behavioral: Negative.   All other systems reviewed and are negative.      Objective:   Physical Exam Awake, alert, appropriate, accompanied by husband, NAD Stiff in R hip due to sitting for prolonged period.  TTP in R groin and R lateral hip- less than groin TTP.  Pain with external and internatal ROM - can't even flex R hip very well due to pain.        Assessment & Plan:   Patient I a 65 yr old female HIV- undetectable for 9 years; no other major issues except B/L hip DJD and s/p L THR with infection noted s/p surgery- Had ear infection of L ear and then had 3 surgeries of L hip. Hx of anterior hip replacements on L Needs R THR- but hasn't scheduled it.    1. Put paperwork on refrigerator to remind self what the plan is.   2. Try Magnesium 400 mg - start with 1 tab/day- can work up to 3 tabs/day for muscle spasms- can't take as needed.  Main side effect is looser stools.  Really helps constipation  3.  Will increase Norco to 10/325 mg #120- to help with pain, eps due to cold bad temperatures.     4. Continue Tizanidine/Zanaflex for muscle spasms- can take up to 3 tablets- 12 mg at night- don't take more than that at 1 time.   5. Opiate drug screen today- she's due  6. F/U in 3 months- earlier if need be!  I spent a total of 22 minutes on visit- as detailed above.

## 2021-01-04 NOTE — Patient Instructions (Signed)
  Patient I a 65 yr old female HIV- undetectable for 9 years; no other major issues except B/L hip DJD and s/p L THR with infection noted s/p surgery- Had ear infection of L ear and then had 3 surgeries of L hip. Hx of anterior hip replacements on L Needs R THR- but hasn't scheduled it.    1. Put paperwork on refrigerator to remind self what the plan is.   2. Try Magnesium 400 mg - start with 1 tab/day- can work up to 3 tabs/day for muscle spasms- can't take as needed.  Main side effect is looser stools.  Really helps constipation  3.  Will increase Norco to 10/325 mg #120- to help with pain, eps due to cold bad temperatures.     4. Continue Tizanidine/Zanaflex for muscle spasms- can take up to 3 tablets- 12 mg at night- don't take more than that at 1 time.   5. Opiate drug screen today- she's due  6. F/U in 3 months- earlier if need be!

## 2021-01-10 LAB — DRUG TOX MONITOR 1 W/CONF, ORAL FLD
Amphetamines: NEGATIVE ng/mL (ref <10)
Barbiturates: NEGATIVE ng/mL (ref <10)
Benzodiazepines: NEGATIVE ng/mL (ref <0.50)
Buprenorphine: NEGATIVE ng/mL (ref <0.10)
Cocaine: NEGATIVE ng/mL (ref <5.0)
Codeine: NEGATIVE ng/mL (ref <2.5)
Cotinine: >250 ng/mL — ABNORMAL HIGH (ref <5.0)
Dihydrocodeine: 14.8 ng/mL — ABNORMAL HIGH (ref <2.5)
Fentanyl: NEGATIVE ng/mL (ref <0.10)
Heroin Metabolite: NEGATIVE ng/mL (ref <1.0)
Hydrocodone: 47 ng/mL — ABNORMAL HIGH (ref <2.5)
Hydromorphone: NEGATIVE ng/mL (ref <2.5)
MARIJUANA: NEGATIVE ng/mL (ref <2.5)
MDMA: NEGATIVE ng/mL (ref <10)
Meprobamate: NEGATIVE ng/mL (ref <2.5)
Methadone: NEGATIVE ng/mL (ref <5.0)
Morphine: NEGATIVE ng/mL (ref <2.5)
Nicotine Metabolite: POSITIVE ng/mL — AB (ref <5.0)
Norhydrocodone: 22 ng/mL — ABNORMAL HIGH (ref <2.5)
Noroxycodone: NEGATIVE ng/mL (ref <2.5)
Opiates: POSITIVE ng/mL — AB (ref <2.5)
Oxycodone: NEGATIVE ng/mL (ref <2.5)
Oxymorphone: NEGATIVE ng/mL (ref <2.5)
Phencyclidine: NEGATIVE ng/mL (ref <10)
Tapentadol: NEGATIVE ng/mL (ref <5.0)
Tramadol: NEGATIVE ng/mL (ref <5.0)
Zolpidem: NEGATIVE ng/mL (ref <5.0)

## 2021-01-10 LAB — DRUG TOX ALC METAB W/CON, ORAL FLD: Alcohol Metabolite: NEGATIVE ng/mL (ref <25)

## 2021-01-16 ENCOUNTER — Telehealth: Payer: Self-pay | Admitting: *Deleted

## 2021-01-16 NOTE — Telephone Encounter (Signed)
Oral swab drug screen was consistent for prescribed medications.  ?

## 2021-02-03 ENCOUNTER — Other Ambulatory Visit: Payer: Self-pay

## 2021-02-03 MED ORDER — HYDROCODONE-ACETAMINOPHEN 10-325 MG PO TABS: 1.0000 | ORAL_TABLET | Freq: Four times a day (QID) | ORAL | 0 refills | Status: DC | PRN

## 2021-02-24 ENCOUNTER — Other Ambulatory Visit: Payer: Self-pay

## 2021-02-24 MED ORDER — HYDROCODONE-ACETAMINOPHEN 10-325 MG PO TABS: 1.0000 | ORAL_TABLET | Freq: Four times a day (QID) | ORAL | 0 refills | Status: DC | PRN

## 2021-03-15 ENCOUNTER — Other Ambulatory Visit: Payer: Self-pay | Admitting: Physical Medicine and Rehabilitation

## 2021-04-03 ENCOUNTER — Other Ambulatory Visit: Payer: Self-pay

## 2021-04-03 MED ORDER — HYDROCODONE-ACETAMINOPHEN 10-325 MG PO TABS: 1.0000 | ORAL_TABLET | Freq: Four times a day (QID) | ORAL | 0 refills | Status: DC | PRN

## 2021-04-05 ENCOUNTER — Encounter
Payer: Medicare Other | Attending: Physical Medicine and Rehabilitation | Admitting: Physical Medicine and Rehabilitation

## 2021-04-05 ENCOUNTER — Encounter: Payer: Self-pay | Admitting: Physical Medicine and Rehabilitation

## 2021-04-05 ENCOUNTER — Other Ambulatory Visit: Payer: Self-pay

## 2021-04-05 VITALS — BP 147/94 | HR 88 | Ht 70.0 in | Wt 199.0 lb

## 2021-04-05 DIAGNOSIS — M87051 Idiopathic aseptic necrosis of right femur: Secondary | ICD-10-CM | POA: Insufficient documentation

## 2021-04-05 DIAGNOSIS — M25551 Pain in right hip: Secondary | ICD-10-CM

## 2021-04-05 DIAGNOSIS — G8929 Other chronic pain: Secondary | ICD-10-CM | POA: Insufficient documentation

## 2021-04-05 NOTE — Patient Instructions (Signed)
Patient is a 65 yr old female HIV- undetectable for 9+ years; no other major issues except B/L hip DJD and s/p L THR with infection noted s/p surgery- Had ear infection of L ear and then had 3 surgeries of L hip. Hx of anterior hip replacements on L Needs R THR- but hasn't scheduled it.   Here for f/u on R hip pain/DJD.   1. Pt said would try to call Dr Ninfa Linden today  2. Worried because mother is 44 yrs old and scared that she will need pt- but explained that right now cannot help her right now either.   3. Encouraged her to get R THR  Because her quality of life is poor right now.   4. Explained can take some time to get in for hip replacement.   5. F/U in 3 months- call me to let me if/when getting surgery.

## 2021-04-05 NOTE — Progress Notes (Signed)
Subjective:    Patient ID: Ashley Shepherd, female    DOB: Jun 01, 1956, 65 y.o.   MRN: [id number]  HPI   Due to national recommendations of social distancing because of COVID 62, an audio/video tele-health visit is felt to be the most appropriate encounter for this patient at this time. See MyChart message from today for the patient's consent to a tele-health encounter with [place]. This is a follow up tele-visit via Webex. The patient is at home. MD is at office.    Patient is a 65 yr old female HIV- undetectable for 9+ years; no other major issues except B/L hip DJD and s/p L THR with infection noted s/p surgery- Had ear infection of L ear and then had 3 surgeries of L hip. Hx of anterior hip replacements on L Needs R THR- but hasn't scheduled it.   Here for f/u on R hip pain/DJD.   Hurting a LOT- using crutch for past 2 days.  Also limps if tries to do anything for 2 days.   No side effects, but scared of addiction'  No constipation- tries to only takes when needs to.  Hurts more than having a baby. .   Doesn't want to go higher on pain meds. Concerned that nothing will help- doesn't want Oxycodone.   Current meds help, but taking q6 hours or so-  Also doing tylenol arthritis.     Pain Inventory Average Pain 8 Pain Right Now 8 My pain is aching  In the last 24 hours, has pain interfered with the following? General activity 5 Relation with others 5 Enjoyment of life 5 What TIME of day is your pain at its worst? morning  Sleep (in general) Good  Pain is worse with: some activites Pain improves with: heat/ice and medication Relief from Meds: 5  Family History  Problem Relation Age of Onset  . Hypertension Father   . Diabetes Father   . Heart attack Father   . Hypertension Other   . Diabetes Other    Social History   Socioeconomic History  . Marital status: Widowed    Spouse name: Not on file  . Number of children: Not on  file  . Years of education: Not on file  . Highest education level: Not on file  Occupational History  . Not on file  Tobacco Use  . Smoking status: Current Every Day Smoker    Packs/day: 0.50    Types: Cigarettes  . Smokeless tobacco: Never Used  Vaping Use  . Vaping Use: Never used  Substance and Sexual Activity  . Alcohol use: No  . Drug use: No  . Sexual activity: Not on file  Other Topics Concern  . Not on file  Social History Narrative  . Not on file   Social Determinants of Health   Financial Resource Strain: Not on file  Food Insecurity: Not on file  Transportation Needs: Not on file  Physical Activity: Not on file  Stress: Not on file  Social Connections: Not on file   Past Surgical History:  Procedure Laterality Date  . ANTERIOR HIP REVISION Left 04/06/2016   Procedure: REMOVAL OF TEMPORARY ANTIBIOTIC SPACER LEFT HIP, LEFT TOTAL HIP REVISION;  Surgeon: Mcarthur Rossetti, MD;  [facility];  Service: Orthopedics;  Laterality: Left;  . ANTERIOR HIP REVISION Left 08/02/2017   Procedure: Irrigation and debridement left hip with antibiotic bead placement;  Surgeon: Mcarthur Rossetti, MD;  [facility];  Service: Orthopedics;  Laterality: Left;  . APPENDECTOMY    . COLON SURGERY  2008  . COLOSTOMY REVERSAL  2013  . EXCISIONAL TOTAL HIP ARTHROPLASTY WITH ANTIBIOTIC SPACERS Left 01/24/2016   Procedure: EXCISION OF LEFT TOTAL HIP ARTHROPLASTY WITH PLACEMENT OF ANTIBIOTIC SPACERS;  Surgeon: Mcarthur Rossetti, MD;  Location: [facility];  Service: Orthopedics;  Laterality: Left;  . INCISION AND DRAINAGE HIP Left 01/24/2016   Procedure: IRRIGATION AND DEBRIDEMENT LEFT HIP;  Surgeon: Mcarthur Rossetti, MD;  Location: [facility];  Service: Orthopedics;  Laterality: Left;  . KNEE ARTHROSCOPY     left  . PORT-A-CATH REMOVAL    . TOTAL HIP ARTHROPLASTY Left 10/01/2014   Procedure: LEFT TOTAL HIP ARTHROPLASTY ANTERIOR APPROACH;  Surgeon: Mcarthur Rossetti, MD;   [facility];  Service: Orthopedics;  Laterality: Left;   Past Surgical History:  Procedure Laterality Date  . ANTERIOR HIP REVISION Left 04/06/2016   Procedure: REMOVAL OF TEMPORARY ANTIBIOTIC SPACER LEFT HIP, LEFT TOTAL HIP REVISION;  Surgeon: Mcarthur Rossetti, MD;  [facility];  Service: Orthopedics;  Laterality: Left;  . ANTERIOR HIP REVISION Left 08/02/2017   Procedure: Irrigation and debridement left hip with antibiotic bead placement;  Surgeon: Mcarthur Rossetti, MD;  [facility];  Service: Orthopedics;  Laterality: Left;  . APPENDECTOMY    . COLON SURGERY  2008  . COLOSTOMY REVERSAL  2013  . EXCISIONAL TOTAL HIP ARTHROPLASTY WITH ANTIBIOTIC SPACERS Left 01/24/2016   Procedure: EXCISION OF LEFT TOTAL HIP ARTHROPLASTY WITH PLACEMENT OF ANTIBIOTIC SPACERS;  Surgeon: Mcarthur Rossetti, MD;  Location: [facility];  Service: Orthopedics;  Laterality: Left;  . INCISION AND DRAINAGE HIP Left 01/24/2016   Procedure: IRRIGATION AND DEBRIDEMENT LEFT HIP;  Surgeon: Mcarthur Rossetti, MD;  Location: [place] [facility];  Service: Orthopedics;  Laterality: Left;  . KNEE ARTHROSCOPY     left  . PORT-A-CATH REMOVAL    . TOTAL HIP ARTHROPLASTY Left 10/01/2014   Procedure: LEFT TOTAL HIP ARTHROPLASTY ANTERIOR APPROACH;  Surgeon: Mcarthur Rossetti, MD;  [facility];  Service: Orthopedics;  Laterality: Left;   Past Medical History:  Diagnosis Date  . Anemia   . Anxiety   . Arthritis   . 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[place])    left  . Cancer ([place])    7 years ago, cancer free now  . Depression   . Difficulty sleeping   . GERD (gastroesophageal reflux disease)   . History of kidney stones   . History of transfusion   . HIV disease ([place])   . Kidney stones   . Pneumonia    BP (!) 147/94 Comment: pt reported, virtual visit  Pulse 88 Comment: pt reported, virtual visit  Ht 5\' 10"  (1.778 m) Comment: pt reported, virtual visit  Wt 199 lb (90.3 kg) Comment: pt reported, virtual  visit  BMI 28.55 kg/m   Opioid Risk Score:   Fall Risk Score:  `1  Depression screen PHQ 2/9  Depression screen [facility] 2/9 01/04/2021 06/03/2020 01/29/2020 04/08/2018 01/21/2018 10/29/2017 09/24/2017  Decreased Interest 0 0 0 0 0 0 0  Down, Depressed, Hopeless 0 0 0 0 0 0 0  PHQ - 2 Score 0 0 0 0 0 0 0  Altered sleeping - - 2 - - - -  Tired, decreased energy - - 1 - - - -  Change in appetite - - 0 - - - -  Feeling bad or failure about yourself  - - 0 - - - -  Trouble concentrating - - 0 - - - -  Moving slowly or fidgety/restless - - 0 - - - -  Suicidal thoughts - - 0 - - - -  PHQ-9 Score - - 3 - - - -    Review of Systems  Musculoskeletal:       Hip pain  All other systems reviewed and are negative.      Objective:   Physical Exam    webex    Assessment & Plan:    Patient is a 65 yr old female HIV- undetectable for 9+ years; no other major issues except B/L hip DJD and s/p L THR with infection noted s/p surgery- Had ear infection of L ear and then had 3 surgeries of L hip. Hx of anterior hip replacements on L Needs R THR- but hasn't scheduled it.   Here for f/u on R hip pain/DJD.   1. Pt said would try to call Dr Ninfa Linden today  2. Worried because mother is 55 yrs old and scared that she will need pt- but explained that right now cannot help her right now either.   3. Encouraged her to get R THR  Because her quality of life is poor right now.   4. Explained can take some time to get in for hip replacement.   5. F/U in 3 months- call me to let me if/when getting surgery.  Won't change meds per pt request.    I spent a total of 20 minutes on visit- on webex- discussing options for R THR and trying to encourage her, because .her pain is so bad.

## 2021-05-02 ENCOUNTER — Other Ambulatory Visit: Payer: Self-pay

## 2021-05-02 MED ORDER — HYDROCODONE-ACETAMINOPHEN 10-325 MG PO TABS: 1.0000 | ORAL_TABLET | Freq: Four times a day (QID) | ORAL | 0 refills | Status: DC | PRN

## 2021-05-17 ENCOUNTER — Ambulatory Visit: Payer: Medicare Other | Admitting: Orthopaedic Surgery

## 2021-05-17 ENCOUNTER — Ambulatory Visit: Payer: Self-pay

## 2021-05-17 ENCOUNTER — Encounter: Payer: Self-pay | Admitting: Orthopaedic Surgery

## 2021-05-17 VITALS — Ht 70.0 in | Wt 191.0 lb

## 2021-05-17 DIAGNOSIS — M87051 Idiopathic aseptic necrosis of right femur: Secondary | ICD-10-CM | POA: Diagnosis not present

## 2021-05-17 DIAGNOSIS — M25551 Pain in right hip: Secondary | ICD-10-CM

## 2021-05-17 NOTE — Progress Notes (Signed)
Office Visit Note   Patient: Ashley Shepherd           Date of Birth: [birth year]           MRN: [id number] Visit Date: 05/17/2021              Requested by: Medicine, [facility] Internal No address on file PCP: Medicine, [facility] Internal   Assessment & Plan: Visit Diagnoses:  1. Avascular necrosis of hip, right (HCC)   2. Pain in right hip     Plan: At this point she is finally interested in with proceeding with hip replacement surgery in her right side and this is really the only option at this standpoint given the severity of her right hip AVN.  She has a good understanding of what the surgery involves as well as the risk and benefits of surgery having had this done before the left side.  We will work on getting this scheduled and we will be in touch.  All questions and concerns were answered and addressed.  Follow-Up Instructions: Return for 2 weeks post-op.   Orders:  No orders of the defined types were placed in this encounter.  No orders of the defined types were placed in this encounter.     Procedures: No procedures performed   Clinical Data: No additional findings.   Subjective: Chief Complaint  Patient presents with  . Right Hip - Pain  The patient is well-known to me.  She has right hip avascular necrosis this been well-documented for many years now.  I have recommended hip replacement in the past for the right hip.  I actually replaced her left hip remotely secondary to avascular necrosis as well.  She says she is finally ready to have the surgery done with the right hip.  Her pain is daily and it is 10 out of 10 with the right hip.  Her left hip has no issues since the replacement.  She is walking with a significant limp.  At this point her right hip pain is definitely affecting her mobility, quality of life and actives daily living.  Her previous x-rays showed severe avascular porosis with deformity of the femoral head on the right side.   She has had no other acute change in medical status.  She is HIV positive but has an undetectable viral load and normal CD4 count.  HPI  Review of Systems She currently denies any headache, chest pain, shortness of breath, fever, chills, nausea, vomiting  Objective: Vital Signs: Ht 5\' 10"  (1.778 m)   Wt 191 lb (86.6 kg)   BMI 27.41 kg/m   Physical Exam She is alert and oriented x3 and in no acute distress Ortho Exam Examination of her left operative hip shows it moves smoothly and fluidly.  Examination of her right hip shows severe pain with attempts of rotation and significant stiffness with the right hip.  She is walking with a Trendelenburg gait. Specialty Comments:  No specialty comments available.  Imaging: No results found. Previous x-rays reviewed again of her right hip show severe avascular necrosis with deformity femoral head and cystic changes.  PMFS History: Patient Active Problem List   Diagnosis Date Noted  . Chronic hip pain, right 04/05/2021  . Avascular necrosis of right femoral head ([place]) 01/29/2020  . Surgery, elective   . Abscess of left hip   . Klebsiella sepsis ([place]) 07/27/2017  . Pyelonephritis 07/23/2017  . Depression with anxiety 07/23/2017  . Acute  pyelonephritis   . Encounter for imaging study to confirm nasogastric (NG) tube placement   . History of left hip replacement 04/06/2016  . HIV disease ([place])   . CKD (chronic kidney disease) stage 3, GFR 30-59 ml/min (HCC)   . 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 Avascular necrosis of bone of left hip ([place]) 10/01/2014  . 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 FB GI (foreign body in gastrointestinal tract) 04/09/2014   Past Medical History:  Diagnosis Date  . Anemia   . Anxiety   . Arthritis   . 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[place])    left  . Cancer ([place])    7 years ago, cancer free now  . Depression    . Difficulty sleeping   . GERD (gastroesophageal reflux disease)   . History of kidney stones   . History of transfusion   . HIV disease ([place])   . Kidney stones   . Pneumonia     Family History  Problem Relation Age of Onset  . Hypertension Father   . Diabetes Father   . Heart attack Father   . Hypertension Other   . Diabetes Other     Past Surgical History:  Procedure Laterality Date  . ANTERIOR HIP REVISION Left 04/06/2016   Procedure: REMOVAL OF TEMPORARY ANTIBIOTIC SPACER LEFT HIP, LEFT TOTAL HIP REVISION;  Surgeon: Mcarthur Rossetti, MD;  [facility];  Service: Orthopedics;  Laterality: Left;  . ANTERIOR HIP REVISION Left 08/02/2017   Procedure: Irrigation and debridement left hip with antibiotic bead placement;  Surgeon: Mcarthur Rossetti, MD;  [facility];  Service: Orthopedics;  Laterality: Left;  . APPENDECTOMY    . COLON SURGERY  2008  . COLOSTOMY REVERSAL  2013  . EXCISIONAL TOTAL HIP ARTHROPLASTY WITH ANTIBIOTIC SPACERS Left 01/24/2016   Procedure: EXCISION OF LEFT TOTAL HIP ARTHROPLASTY WITH PLACEMENT OF ANTIBIOTIC SPACERS;  Surgeon: Mcarthur Rossetti, MD;  Location: [place];  Service: Orthopedics;  Laterality: Left;  . INCISION AND DRAINAGE HIP Left 01/24/2016   Procedure: IRRIGATION AND DEBRIDEMENT LEFT HIP;  Surgeon: Mcarthur Rossetti, MD;  Location: [place];  Service: Orthopedics;  Laterality: Left;  . KNEE ARTHROSCOPY     left  . PORT-A-CATH REMOVAL    . TOTAL HIP ARTHROPLASTY Left 10/01/2014   Procedure: LEFT TOTAL HIP ARTHROPLASTY ANTERIOR APPROACH;  Surgeon: Mcarthur Rossetti, MD;  [facility];  Service: Orthopedics;  Laterality: Left;   Social History   Occupational History  . Not on file  Tobacco Use  . Smoking status: Current Every Day Smoker    Packs/day: 0.50    Types: Cigarettes  . Smokeless tobacco: Never Used  Vaping Use  . Vaping Use: Never used  Substance and Sexual Activity  . Alcohol use: No  . Drug  use: No  . Sexual activity: Not on file

## 2021-05-31 MED ORDER — OXYCODONE HCL 5 MG PO TABS: 5.0000 mg | ORAL_TABLET | Freq: Four times a day (QID) | ORAL | 0 refills | Status: DC | PRN

## 2021-05-31 NOTE — Telephone Encounter (Signed)
Pt reports Norco 10/325 is just not cutting it for her- of note, has severe avascular necrosis of the hip and needs a replacement- trying to schedule surgery right now- will change to Oxycodone 5 mg q6 hours prn #120 for now- and see how she does. At least we won't worry about the amount of tylenol she's getting.

## 2021-06-14 ENCOUNTER — Other Ambulatory Visit: Payer: Self-pay

## 2021-06-29 ENCOUNTER — Other Ambulatory Visit: Payer: Self-pay

## 2021-06-29 ENCOUNTER — Encounter: Payer: Self-pay | Admitting: *Deleted

## 2021-06-29 MED ORDER — OXYCODONE HCL 5 MG PO TABS: 5.0000 mg | ORAL_TABLET | Freq: Four times a day (QID) | ORAL | 0 refills | Status: DC | PRN

## 2021-06-29 NOTE — Telephone Encounter (Signed)
PMP was Reviewed. Dr Dagoberto Ligas note was reviewed.  Ashley Shepherd had a video visit on 04/05/2021. She doesn't have a scheduled F/U visit. A message was sent to Ashley Shepherd to schedule a office visit with Dr Dagoberto Ligas for further refills. She is scheduled for surgery on 07/14/2021 for Right Total Hip Arthroplasty.

## 2021-07-04 ENCOUNTER — Other Ambulatory Visit: Payer: Self-pay | Admitting: Physician Assistant

## 2021-07-05 NOTE — Patient Instructions (Addendum)
DUE TO COVID-19 ONLY ONE VISITOR IS ALLOWED TO COME WITH YOU AND STAY IN THE WAITING ROOM ONLY DURING PRE OP AND PROCEDURE DAY OF SURGERY. THE 1 VISITOR  MAY VISIT WITH YOU AFTER SURGERY IN YOUR PRIVATE ROOM DURING VISITING HOURS ONLY!  YOU NEED TO HAVE A COVID 19 TEST ON: 07/11/21 @ 9:00 AM, THIS TEST MUST BE DONE BEFORE SURGERY,  COVID TESTING SITE [place] [id number], IT IS ON THE RIGHT GOING OUT [address] APPROXIMATELY  2 MINUTES PAST [employer] ON THE RIGHT. ONCE YOUR COVID TEST IS COMPLETED,  PLEASE BEGIN THE QUARANTINE INSTRUCTIONS AS OUTLINED IN YOUR HANDOUT.               Ashley Shepherd   Your procedure is scheduled on:07/14/21    Report to [facility] Main  Entrance   Report to admitting at: 8:30 AM     Call this number if you have problems the morning of surgery [phone]    Remember: NO SOLID FOOD AFTER MIDNIGHT THE NIGHT PRIOR TO SURGERY. NOTHING BY MOUTH EXCEPT CLEAR LIQUIDS UNTIL: 8:00 AM . PLEASE FINISH ENSURE DRINK PER SURGEON ORDER  WHICH NEEDS TO BE COMPLETED AT: 8:00 AM .  CLEAR LIQUID DIET  Foods Allowed                                                                     Foods Excluded  Coffee and tea, regular and decaf                             liquids that you cannot  Plain Jell-O any favor except red or purple                                           see through such as: Fruit ices (not with fruit pulp)                                     milk, soups, orange juice  Iced Popsicles                                    All solid food Carbonated beverages, regular and diet                                    Cranberry, grape and apple juices Sports drinks like Gatorade Lightly seasoned clear broth or consume(fat free) Sugar, honey syrup  Sample Menu Breakfast                                Lunch  Supper Cranberry juice                    Beef broth                            Chicken  broth Jell-O                                     Grape juice                           Apple juice Coffee or tea                        Jell-O                                      Popsicle                                                Coffee or tea                        Coffee or tea  _____________________________________________________________________   BRUSH YOUR TEETH MORNING OF SURGERY AND RINSE YOUR MOUTH OUT, NO CHEWING GUM CANDY OR MINTS.    Take these medicines the morning of surgery with A SIP OF WATER: celexa,dulutegravir,emtricitabine,maraviroc.                               You may not have any metal on your body including hair pins and              piercings  Do not wear jewelry, make-up, lotions, powders or perfumes, deodorant             Do not wear nail polish on your fingernails.  Do not shave  48 hours prior to surgery.    Do not bring valuables to the hospital. [place].  Contacts, dentures or bridgework may not be worn into surgery.  Leave suitcase in the car. After surgery it may be brought to your room.     Patients discharged the day of surgery will not be allowed to drive home. IF YOU ARE HAVING SURGERY AND GOING HOME THE SAME DAY, YOU MUST HAVE AN ADULT TO DRIVE YOU HOME AND BE WITH YOU FOR 24 HOURS. YOU MAY GO HOME BY TAXI OR UBER OR ORTHERWISE, BUT AN ADULT MUST ACCOMPANY YOU HOME AND STAY WITH YOU FOR 24 HOURS.  Name and phone number of your driver:  Special Instructions: N/A              Please read over the following fact sheets you were given: _____________________________________________________________________           [facility] - Preparing for Surgery Before surgery, you can play an important role.  Because skin is not sterile, your skin needs to be as free of germs as possible.  You can reduce the number of germs on your skin by washing with CHG (chlorahexidine gluconate) soap before surgery.  CHG  is an antiseptic cleaner which kills germs and bonds with the skin to continue killing germs even after washing. Please DO NOT use if you have an allergy to CHG or antibacterial soaps.  If your skin becomes reddened/irritated stop using the CHG and inform your nurse when you arrive at Short Stay. Do not shave (including legs and underarms) for at least 48 hours prior to the first CHG shower.  You may shave your face/neck. Please follow these instructions carefully:  1.  Shower with CHG Soap the night before surgery and the  morning of Surgery.  2.  If you choose to wash your hair, wash your hair first as usual with your  normal  shampoo.  3.  After you shampoo, rinse your hair and body thoroughly to remove the  shampoo.                           4.  Use CHG as you would any other liquid soap.  You can apply chg directly  to the skin and wash                       Gently with a scrungie or clean washcloth.  5.  Apply the CHG Soap to your body ONLY FROM THE NECK DOWN.   Do not use on face/ open                           Wound or open sores. Avoid contact with eyes, ears mouth and genitals (private parts).                       Wash face,  Genitals (private parts) with your normal soap.             6.  Wash thoroughly, paying special attention to the area where your surgery  will be performed.  7.  Thoroughly rinse your body with warm water from the neck down.  8.  DO NOT shower/wash with your normal soap after using and rinsing off  the CHG Soap.                9.  Pat yourself dry with a clean towel.            10.  Wear clean pajamas.            11.  Place clean sheets on your bed the night of your first shower and do not  sleep with pets. Day of Surgery : Do not apply any lotions/deodorants the morning of surgery.  Please wear clean clothes to the hospital/surgery center.  FAILURE TO FOLLOW THESE INSTRUCTIONS MAY RESULT IN THE CANCELLATION OF YOUR SURGERY PATIENT  SIGNATURE_________________________________  NURSE SIGNATURE__________________________________  ________________________________________________________________________   Ashley Shepherd  An incentive spirometer is a tool that can help keep your lungs clear and active. This tool measures how well you are filling your lungs with each breath. Taking long deep breaths may help reverse or decrease the chance of developing breathing (pulmonary) problems (especially infection) following: A long period of time when you are unable to move or be active. BEFORE THE PROCEDURE  If the spirometer includes an indicator to show your best effort, your nurse or respiratory therapist will set it  to a desired goal. If possible, sit up straight or lean slightly forward. Try not to slouch. Hold the incentive spirometer in an upright position. INSTRUCTIONS FOR USE  Sit on the edge of your bed if possible, or sit up as far as you can in bed or on a chair. Hold the incentive spirometer in an upright position. Breathe out normally. Place the mouthpiece in your mouth and seal your lips tightly around it. Breathe in slowly and as deeply as possible, raising the piston or the ball toward the top of the column. Hold your breath for 3-5 seconds or for as long as possible. Allow the piston or ball to fall to the bottom of the column. Remove the mouthpiece from your mouth and breathe out normally. Rest for a few seconds and repeat Steps 1 through 7 at least 10 times every 1-2 hours when you are awake. Take your time and take a few normal breaths between deep breaths. The spirometer may include an indicator to show your best effort. Use the indicator as a goal to work toward during each repetition. After each set of 10 deep breaths, practice coughing to be sure your lungs are clear. If you have an incision (the cut made at the time of surgery), support your incision when coughing by placing a pillow or rolled up towels  firmly against it. Once you are able to get out of bed, walk around indoors and cough well. You may stop using the incentive spirometer when instructed by your caregiver.  RISKS AND COMPLICATIONS Take your time so you do not get dizzy or light-headed. If you are in pain, you may need to take or ask for pain medication before doing incentive spirometry. It is harder to take a deep breath if you are having pain. AFTER USE Rest and breathe slowly and easily. It can be helpful to keep track of a log of your progress. Your caregiver can provide you with a simple table to help with this. If you are using the spirometer at home, follow these instructions: [place] IF:  You are having difficultly using the spirometer. You have trouble using the spirometer as often as instructed. Your pain medication is not giving enough relief while using the spirometer. You develop fever of 100.5 F (38.1 C) or higher. SEEK IMMEDIATE MEDICAL CARE IF:  You cough up bloody sputum that had not been present before. You develop fever of 102 F (38.9 C) or greater. You develop worsening pain at or near the incision site. MAKE SURE YOU:  Understand these instructions. Will watch your condition. Will get help right away if you are not doing well or get worse. Document Released: 04/22/2007 Document Revised: 03/03/2012 Document Reviewed: 06/23/2007 [facility] Patient Information 2014 [place], [place].   ________________________________________________________________________

## 2021-07-06 ENCOUNTER — Encounter (HOSPITAL_COMMUNITY): Payer: Self-pay

## 2021-07-06 ENCOUNTER — Other Ambulatory Visit: Payer: Self-pay

## 2021-07-06 ENCOUNTER — Encounter (HOSPITAL_COMMUNITY)
Admission: RE | Admit: 2021-07-06 | Discharge: 2021-07-06 | Disposition: A | Discharge status: Home / Self Care | Payer: Medicare Other | Source: Ambulatory Visit | Attending: Orthopaedic Surgery | Admitting: Orthopaedic Surgery

## 2021-07-06 DIAGNOSIS — Z01812 Encounter for preprocedural laboratory examination: Secondary | ICD-10-CM | POA: Insufficient documentation

## 2021-07-06 LAB — CBC
HCT: 41.9 % (ref 36.0–46.0)
Hemoglobin: 14.1 g/dL (ref 12.0–15.0)
MCH: 31.4 pg (ref 26.0–34.0)
MCHC: 33.7 g/dL (ref 30.0–36.0)
MCV: 93.3 fL (ref 80.0–100.0)
Platelets: 198 10*3/uL (ref 150–400)
RBC: 4.49 MIL/uL (ref 3.87–5.11)
RDW: 14.6 % (ref 11.5–15.5)
WBC: 5.9 10*3/uL (ref 4.0–10.5)
nRBC: 0 % (ref 0.0–0.2)

## 2021-07-06 LAB — SURGICAL PCR SCREEN
MRSA, PCR: NEGATIVE
Staphylococcus aureus: POSITIVE — AB

## 2021-07-06 NOTE — Progress Notes (Signed)
PCR: Positive: STAPH.

## 2021-07-06 NOTE — Progress Notes (Addendum)
COVID Vaccine Completed: Yes Date COVID Vaccine completed: 04/02/20. 04/20/20: Moderna Boaster. COVID vaccine manufacturer:   [place]   PCP - [facility] Cardiologist -   Chest x-Rybka -  EKG -  Stress Test -  ECHO -  Cardiac Cath -  Pacemaker/ICD device last checked:  Sleep Study -  CPAP -   Fasting Blood Sugar -  Checks Blood Sugar _____ times a day  Blood Thinner Instructions: Aspirin Instructions: Last Dose:  Anesthesia review:   Patient denies shortness of breath, fever, cough and chest pain at PAT appointment   Patient verbalized understanding of instructions that were given to them at the PAT appointment. Patient was also instructed that they will need to review over the PAT instructions again at home before surgery.

## 2021-07-11 ENCOUNTER — Other Ambulatory Visit (HOSPITAL_COMMUNITY)
Admission: RE | Admit: 2021-07-11 | Discharge: 2021-07-11 | Disposition: A | Discharge status: Home / Self Care | Payer: Medicare Other | Source: Ambulatory Visit | Attending: Orthopaedic Surgery | Admitting: Orthopaedic Surgery

## 2021-07-11 DIAGNOSIS — Z01812 Encounter for preprocedural laboratory examination: Secondary | ICD-10-CM | POA: Insufficient documentation

## 2021-07-11 DIAGNOSIS — Z20822 Contact with and (suspected) exposure to covid-19: Secondary | ICD-10-CM | POA: Insufficient documentation

## 2021-07-11 LAB — SARS CORONAVIRUS 2 (TAT 6-24 HRS): SARS Coronavirus 2: NEGATIVE

## 2021-07-13 ENCOUNTER — Other Ambulatory Visit: Payer: Self-pay | Admitting: Physical Medicine and Rehabilitation

## 2021-07-13 NOTE — H&P (Signed)
TOTAL HIP ADMISSION H&P  Patient is admitted for right total hip arthroplasty.  Subjective:  Chief Complaint: right hip pain  HPI: Ashley Shepherd, 65 y.o. female, has a history of pain and functional disability in the right hip(s) due to  avascular necrosis  and patient has failed non-surgical conservative treatments for greater than 12 weeks to include NSAID's and/or analgesics, flexibility and strengthening excercises, use of assistive devices, and activity modification.  Onset of symptoms was gradual starting 4 years ago with rapidlly worsening course since that time.The patient noted no past surgery on the right hip(s).  Patient currently rates pain in the right hip at 10 out of 10 with activity. Patient has night pain, worsening of pain with activity and weight bearing, trendelenberg gait, pain that interfers with activities of daily living, and pain with passive range of motion. Patient has evidence of subchondral cysts, subchondral sclerosis, periarticular osteophytes, and joint space narrowing by imaging studies. This condition presents safety issues increasing the risk of falls. This patient has had  AVN of the right hip .  There is no current active infection.  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[place]) 01/24/2016   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[place]) 01/24/2016   Avascular necrosis of bone of left hip ([place]) 10/01/2014   Status post total replacement of  left hip 10/01/2014   FB GI (foreign body in gastrointestinal tract) 04/09/2014   Past Medical History:  Diagnosis Date   Anemia    Anxiety    Arthritis    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HCC)    left   Cancer (HCC)    7 years ago, cancer free now   Depression    Difficulty sleeping    GERD (gastroesophageal reflux disease)    History of kidney stones    History of transfusion    HIV disease ([place])    Kidney stones    Pneumonia     Past Surgical History:  Procedure Laterality Date   ANTERIOR HIP REVISION Left 04/06/2016   Procedure: REMOVAL OF TEMPORARY ANTIBIOTIC SPACER LEFT HIP, LEFT TOTAL HIP REVISION;  Surgeon: Mcarthur Rossetti, MD;  Location: [facility];  Service: Orthopedics;  Laterality: Left;   ANTERIOR HIP REVISION Left 08/02/2017   Procedure: Irrigation and debridement left hip with antibiotic bead placement;  Surgeon: Mcarthur Rossetti, MD;  Location: [facility];  Service: Orthopedics;  Laterality: Left;   APPENDECTOMY     COLON SURGERY  2008   COLOSTOMY REVERSAL  2013   EXCISIONAL TOTAL HIP ARTHROPLASTY WITH ANTIBIOTIC SPACERS Left 01/24/2016   Procedure: EXCISION OF LEFT TOTAL HIP ARTHROPLASTY WITH PLACEMENT OF ANTIBIOTIC SPACERS;  Surgeon: Mcarthur Rossetti, MD;  Location: [place];  Service: Orthopedics;  Laterality: Left;   INCISION AND DRAINAGE HIP Left 01/24/2016   Procedure: IRRIGATION AND DEBRIDEMENT LEFT HIP;  Surgeon: Mcarthur Rossetti, MD;  Location: [place];  Service: Orthopedics;  Laterality: Left;   KNEE ARTHROSCOPY     left   PORT-A-CATH REMOVAL     TOTAL HIP ARTHROPLASTY Left 10/01/2014   Procedure: LEFT TOTAL HIP ARTHROPLASTY ANTERIOR APPROACH;  Surgeon: Mcarthur Rossetti, MD;  Location: [facility];  Service: Orthopedics;  Laterality: Left;    No current facility-administered medications for this encounter.   Current Outpatient Medications  Medication Sig Dispense Refill Last Dose   ALPRAZolam (XANAX) 0.25 MG tablet Take 0.25 mg by mouth at  bedtime as needed for anxiety or sleep.      aspirin 81 MG tablet Take 81 mg by mouth daily.      citalopram (CELEXA) 20 MG tablet Take 20 mg by mouth daily.      dolutegravir (TIVICAY) 50 MG tablet Take 50 mg by mouth 2 (two) times daily.      emtricitabine-tenofovir AF (DESCOVY) 200-25 MG tablet Take 1 tablet by mouth daily.      ferrous sulfate 325 (65 FE) MG tablet Take 325 mg by mouth daily with breakfast.      maraviroc (SELZENTRY) 300 MG tablet Take 300 mg by mouth 2 (two) times daily.      Multiple Vitamin (MULTIVITAMIN WITH MINERALS) TABS tablet Take 1 tablet by mouth daily.      oxyCODONE (ROXICODONE) 5 MG immediate release tablet Take 1 tablet (5 mg total) by mouth every 6 (six) hours as needed for severe pain. 120 tablet 0    tiZANidine (ZANAFLEX) 4 MG tablet TAKE 1 TABLET(4 MG) BY MOUTH EVERY 8 HOURS AS NEEDED FOR MUSCLE SPASMS (Patient taking differently: Take 4 mg by mouth every 8 (eight) hours as needed.) 90 tablet 5    cyclobenzaprine (FLEXERIL) 10 MG tablet TAKE 2 TABLETS(20 MG) BY MOUTH THREE TIMES DAILY AS NEEDED FOR MUSCLE SPASMS. MAX DOSE IS 60 MG D- TRY TO TAKE 2 TIMES PER DAY FOR MUSCLE SPASMS 45 tablet 0    Allergies  Allergen Reactions   Iodine Itching    States that still can get the dye but it has to be mixed with something like benadryl to prevent the itching   Iodinated Diagnostic Agents Itching    Pt stated that she had NO problem/reaction to topical iodine/betadine solution.   Sulfa Antibiotics Itching   Codeine Itching    Social History   Tobacco Use   Smoking status: Every Day    Packs/day: 0.50    Types: Cigarettes   Smokeless tobacco: Never  Substance Use Topics   Alcohol use: No    Family History  Problem Relation Age of Onset   Hypertension Father    Diabetes Father    Heart attack Father    Hypertension Other    Diabetes Other      Review of Systems  Musculoskeletal:  Positive for gait problem.  All other systems reviewed and are  negative.  Objective:  Physical Exam Vitals reviewed.  Constitutional:      Appearance: Normal appearance.  HENT:     Head: Normocephalic and atraumatic.  Eyes:     Extraocular Movements: Extraocular movements intact.     Pupils: Pupils are equal, round, and reactive to light.  Cardiovascular:     Rate and Rhythm: Normal rate and regular rhythm.  Pulmonary:     Effort: Pulmonary effort is normal.     Breath sounds: Normal breath sounds.  Abdominal:     Palpations: Abdomen is soft.  Musculoskeletal:     Cervical back: Normal range of motion and neck  supple.     Right hip: Tenderness and bony tenderness present. Decreased range of motion. Decreased strength.  Neurological:     Mental Status: She is alert and oriented to person, place, and time.  Psychiatric:        Behavior: Behavior normal.    Vital signs in last 24 hours:    Labs:   Estimated body mass index is 27.41 kg/m as calculated from the following:   Height as of 07/06/21: 5\' 10"  (1.778 m).   Weight as of 05/17/21: 86.6 kg.   Imaging Review Plain radiographs demonstrate severe avascular necrosis of the right hip(s). The bone quality appears to be good for age and reported activity level.      Assessment/Plan:  End stage AVN, right hip(s)  The patient history, physical examination, clinical judgement of the provider and imaging studies are consistent with end stage avascular necrosis of the right hip(s) and total hip arthroplasty is deemed medically necessary. The treatment options including medical management, injection therapy, arthroscopy and arthroplasty were discussed at length. The risks and benefits of total hip arthroplasty were presented and reviewed. The risks due to aseptic loosening, infection, stiffness, dislocation/subluxation,  thromboembolic complications and other imponderables were discussed.  The patient acknowledged the explanation, agreed to proceed with the plan and consent was signed.  Patient is being admitted for inpatient treatment for surgery, pain control, PT, OT, prophylactic antibiotics, VTE prophylaxis, progressive ambulation and ADL's and discharge planning.The patient is planning to be discharged home with home health services

## 2021-07-14 ENCOUNTER — Ambulatory Visit (HOSPITAL_COMMUNITY): Payer: Medicare Other | Admitting: Anesthesiology

## 2021-07-14 ENCOUNTER — Other Ambulatory Visit: Payer: Self-pay

## 2021-07-14 ENCOUNTER — Encounter (HOSPITAL_COMMUNITY): Payer: Self-pay | Admitting: Orthopaedic Surgery

## 2021-07-14 ENCOUNTER — Encounter (HOSPITAL_COMMUNITY)
Admission: RE | Disposition: A | Discharge status: Home / Self Care | Payer: Self-pay | Source: Home / Self Care | Attending: Orthopaedic Surgery

## 2021-07-14 ENCOUNTER — Observation Stay (HOSPITAL_COMMUNITY)
Admission: RE | Admit: 2021-07-14 | Discharge: 2021-07-15 | Disposition: A | Discharge status: Home / Self Care | Payer: Medicare Other | Attending: Orthopaedic Surgery | Admitting: Orthopaedic Surgery

## 2021-07-14 ENCOUNTER — Observation Stay (HOSPITAL_COMMUNITY): Payer: Medicare Other

## 2021-07-14 ENCOUNTER — Ambulatory Visit (HOSPITAL_COMMUNITY): Payer: Medicare Other

## 2021-07-14 DIAGNOSIS — N183 Chronic kidney disease, stage 3 unspecified: Secondary | ICD-10-CM | POA: Diagnosis not present

## 2021-07-14 DIAGNOSIS — B20 Human immunodeficiency virus [HIV] disease: Secondary | ICD-10-CM | POA: Insufficient documentation

## 2021-07-14 DIAGNOSIS — M1611 Unilateral primary osteoarthritis, right hip: Principal | ICD-10-CM | POA: Insufficient documentation

## 2021-07-14 DIAGNOSIS — Z419 Encounter for procedure for purposes other than remedying health state, unspecified: Secondary | ICD-10-CM

## 2021-07-14 DIAGNOSIS — M87051 Idiopathic aseptic necrosis of right femur: Secondary | ICD-10-CM | POA: Diagnosis not present

## 2021-07-14 DIAGNOSIS — Z96642 Presence of left artificial hip joint: Secondary | ICD-10-CM | POA: Diagnosis not present

## 2021-07-14 DIAGNOSIS — F17210 Nicotine dependence, cigarettes, uncomplicated: Secondary | ICD-10-CM | POA: Insufficient documentation

## 2021-07-14 DIAGNOSIS — Z7982 Long term (current) use of aspirin: Secondary | ICD-10-CM | POA: Insufficient documentation

## 2021-07-14 DIAGNOSIS — Z859 Personal history of malignant neoplasm, unspecified: Secondary | ICD-10-CM | POA: Insufficient documentation

## 2021-07-14 DIAGNOSIS — Z96641 Presence of right artificial hip joint: Secondary | ICD-10-CM

## 2021-07-14 HISTORY — PX: TOTAL HIP ARTHROPLASTY: SHX124

## 2021-07-14 LAB — TYPE AND SCREEN
ABO/RH(D): O POS
Antibody Screen: NEGATIVE

## 2021-07-14 SURGERY — ARTHROPLASTY, HIP, TOTAL, ANTERIOR APPROACH
Anesthesia: Monitor Anesthesia Care | Site: Hip | Laterality: Right

## 2021-07-14 MED ORDER — ACETAMINOPHEN 325 MG PO TABS
325.0000 mg | ORAL_TABLET | ORAL | Status: DC | PRN
Start: 2021-07-14 — End: 2021-07-14

## 2021-07-14 MED ORDER — SODIUM CHLORIDE 0.9 % IV SOLN: INTRAVENOUS | Status: DC

## 2021-07-14 MED ORDER — CHLORHEXIDINE GLUCONATE 0.12 % MT SOLN
15.0000 mL | Freq: Once | OROMUCOSAL | Status: AC
  Administered 2021-07-14: 15 mL via OROMUCOSAL

## 2021-07-14 MED ORDER — PHENYLEPHRINE 40 MCG/ML (10ML) SYRINGE FOR IV PUSH (FOR BLOOD PRESSURE SUPPORT)
PREFILLED_SYRINGE | INTRAVENOUS | Status: DC | PRN
  Administered 2021-07-14 (×3): 80 ug via INTRAVENOUS

## 2021-07-14 MED ORDER — ONDANSETRON HCL 4 MG/2ML IJ SOLN
INTRAMUSCULAR | Status: DC | PRN
  Administered 2021-07-14: 4 mg via INTRAVENOUS

## 2021-07-14 MED ORDER — 0.9 % SODIUM CHLORIDE (POUR BTL) OPTIME
TOPICAL | Status: DC | PRN
  Administered 2021-07-14: 1000 mL

## 2021-07-14 MED ORDER — SODIUM CHLORIDE 0.9 % IR SOLN
Status: DC | PRN
  Administered 2021-07-14: 1000 mL

## 2021-07-14 MED ORDER — ASPIRIN 81 MG PO CHEW
81.0000 mg | CHEWABLE_TABLET | Freq: Two times a day (BID) | ORAL | Status: DC
  Administered 2021-07-14 – 2021-07-15 (×2): 81 mg via ORAL
  Filled 2021-07-14 (×2): qty 1

## 2021-07-14 MED ORDER — FENTANYL CITRATE (PF) 100 MCG/2ML IJ SOLN
INTRAMUSCULAR | Status: AC
  Filled 2021-07-14: qty 2

## 2021-07-14 MED ORDER — FERROUS SULFATE 325 (65 FE) MG PO TABS
325.0000 mg | ORAL_TABLET | Freq: Every day | ORAL | Status: DC
  Administered 2021-07-15: 325 mg via ORAL
  Filled 2021-07-14: qty 1

## 2021-07-14 MED ORDER — OXYCODONE HCL 5 MG PO TABS
ORAL_TABLET | ORAL | Status: AC
  Filled 2021-07-14: qty 1

## 2021-07-14 MED ORDER — MEPERIDINE HCL 50 MG/ML IJ SOLN: 6.2500 mg | INTRAMUSCULAR | Status: DC | PRN

## 2021-07-14 MED ORDER — PROPOFOL 500 MG/50ML IV EMUL
INTRAVENOUS | Status: DC | PRN
  Administered 2021-07-14: 75 ug/kg/min via INTRAVENOUS

## 2021-07-14 MED ORDER — DEXAMETHASONE SODIUM PHOSPHATE 10 MG/ML IJ SOLN
INTRAMUSCULAR | Status: AC
  Filled 2021-07-14: qty 1

## 2021-07-14 MED ORDER — DOLUTEGRAVIR SODIUM 50 MG PO TABS
50.0000 mg | ORAL_TABLET | Freq: Two times a day (BID) | ORAL | Status: DC
  Administered 2021-07-14 – 2021-07-15 (×2): 50 mg via ORAL
  Filled 2021-07-14 (×2): qty 1

## 2021-07-14 MED ORDER — ACETAMINOPHEN 325 MG PO TABS: 325.0000 mg | ORAL_TABLET | Freq: Four times a day (QID) | ORAL | Status: DC | PRN

## 2021-07-14 MED ORDER — MARAVIROC 300 MG PO TABS
300.0000 mg | ORAL_TABLET | Freq: Two times a day (BID) | ORAL | Status: DC
  Administered 2021-07-14 – 2021-07-15 (×2): 300 mg via ORAL
  Filled 2021-07-14 (×2): qty 1

## 2021-07-14 MED ORDER — CITALOPRAM HYDROBROMIDE 20 MG PO TABS
20.0000 mg | ORAL_TABLET | Freq: Every day | ORAL | Status: DC
  Administered 2021-07-15: 20 mg via ORAL
  Filled 2021-07-14: qty 1

## 2021-07-14 MED ORDER — FENTANYL CITRATE (PF) 100 MCG/2ML IJ SOLN
INTRAMUSCULAR | Status: DC | PRN
  Administered 2021-07-14: 100 ug via INTRAVENOUS

## 2021-07-14 MED ORDER — METOCLOPRAMIDE HCL 5 MG PO TABS: 5.0000 mg | ORAL_TABLET | Freq: Three times a day (TID) | ORAL | Status: DC | PRN

## 2021-07-14 MED ORDER — OXYCODONE HCL 5 MG/5ML PO SOLN: 5.0000 mg | Freq: Once | ORAL | Status: AC | PRN

## 2021-07-14 MED ORDER — OXYCODONE HCL 5 MG PO TABS
5.0000 mg | ORAL_TABLET | Freq: Once | ORAL | Status: AC | PRN
  Administered 2021-07-14: 5 mg via ORAL

## 2021-07-14 MED ORDER — ACETAMINOPHEN 160 MG/5ML PO SOLN: 325.0000 mg | ORAL | Status: DC | PRN

## 2021-07-14 MED ORDER — PHENYLEPHRINE 40 MCG/ML (10ML) SYRINGE FOR IV PUSH (FOR BLOOD PRESSURE SUPPORT)
PREFILLED_SYRINGE | INTRAVENOUS | Status: AC
  Filled 2021-07-14: qty 10

## 2021-07-14 MED ORDER — DEXAMETHASONE SODIUM PHOSPHATE 10 MG/ML IJ SOLN
INTRAMUSCULAR | Status: DC | PRN
  Administered 2021-07-14: 7 mg via INTRAVENOUS

## 2021-07-14 MED ORDER — POLYETHYLENE GLYCOL 3350 17 G PO PACK: 17.0000 g | PACK | Freq: Every day | ORAL | Status: DC | PRN

## 2021-07-14 MED ORDER — ORAL CARE MOUTH RINSE: 15.0000 mL | Freq: Once | OROMUCOSAL | Status: AC

## 2021-07-14 MED ORDER — ONDANSETRON HCL 4 MG PO TABS: 4.0000 mg | ORAL_TABLET | Freq: Four times a day (QID) | ORAL | Status: DC | PRN

## 2021-07-14 MED ORDER — CEFAZOLIN SODIUM-DEXTROSE 1-4 GM/50ML-% IV SOLN
1.0000 g | Freq: Four times a day (QID) | INTRAVENOUS | Status: AC
  Administered 2021-07-14 (×2): 1 g via INTRAVENOUS
  Filled 2021-07-14 (×2): qty 50

## 2021-07-14 MED ORDER — OXYCODONE HCL 5 MG PO TABS
10.0000 mg | ORAL_TABLET | ORAL | Status: DC | PRN
  Administered 2021-07-14: 10 mg via ORAL
  Administered 2021-07-15 (×2): 15 mg via ORAL
  Administered 2021-07-15 (×2): 10 mg via ORAL
  Filled 2021-07-14: qty 2
  Filled 2021-07-14 (×2): qty 3
  Filled 2021-07-14: qty 2

## 2021-07-14 MED ORDER — PROPOFOL 10 MG/ML IV BOLUS
INTRAVENOUS | Status: DC | PRN
  Administered 2021-07-14: 20 mg via INTRAVENOUS
  Administered 2021-07-14: 30 mg via INTRAVENOUS

## 2021-07-14 MED ORDER — ONDANSETRON HCL 4 MG/2ML IJ SOLN
4.0000 mg | Freq: Once | INTRAMUSCULAR | Status: DC | PRN
Start: 2021-07-14 — End: 2021-07-14

## 2021-07-14 MED ORDER — METOCLOPRAMIDE HCL 5 MG/ML IJ SOLN: 5.0000 mg | Freq: Three times a day (TID) | INTRAMUSCULAR | Status: DC | PRN

## 2021-07-14 MED ORDER — FENTANYL CITRATE (PF) 100 MCG/2ML IJ SOLN
INTRAMUSCULAR | Status: AC
  Administered 2021-07-14: 50 ug via INTRAVENOUS
  Filled 2021-07-14: qty 2

## 2021-07-14 MED ORDER — PHENOL 1.4 % MT LIQD: 1.0000 | OROMUCOSAL | Status: DC | PRN

## 2021-07-14 MED ORDER — SODIUM CHLORIDE 0.9 % IV SOLN
2.0000 g | INTRAVENOUS | Status: AC
  Administered 2021-07-14: 2 g via INTRAVENOUS
  Filled 2021-07-14: qty 2

## 2021-07-14 MED ORDER — ONDANSETRON HCL 4 MG/2ML IJ SOLN
4.0000 mg | Freq: Four times a day (QID) | INTRAMUSCULAR | Status: DC | PRN
  Administered 2021-07-14: 4 mg via INTRAVENOUS
  Filled 2021-07-14: qty 2

## 2021-07-14 MED ORDER — FENTANYL CITRATE (PF) 100 MCG/2ML IJ SOLN: 25.0000 ug | INTRAMUSCULAR | Status: DC | PRN

## 2021-07-14 MED ORDER — DOCUSATE SODIUM 100 MG PO CAPS
100.0000 mg | ORAL_CAPSULE | Freq: Two times a day (BID) | ORAL | Status: DC
  Administered 2021-07-14 – 2021-07-15 (×2): 100 mg via ORAL
  Filled 2021-07-14 (×3): qty 1

## 2021-07-14 MED ORDER — ONDANSETRON HCL 4 MG/2ML IJ SOLN
INTRAMUSCULAR | Status: AC
  Filled 2021-07-14: qty 2

## 2021-07-14 MED ORDER — STERILE WATER FOR IRRIGATION IR SOLN
Status: DC | PRN
  Administered 2021-07-14: 2000 mL

## 2021-07-14 MED ORDER — TRANEXAMIC ACID-NACL 1000-0.7 MG/100ML-% IV SOLN
1000.0000 mg | INTRAVENOUS | Status: AC
  Administered 2021-07-14: 1000 mg via INTRAVENOUS
  Filled 2021-07-14: qty 100

## 2021-07-14 MED ORDER — PANTOPRAZOLE SODIUM 40 MG PO TBEC
40.0000 mg | DELAYED_RELEASE_TABLET | Freq: Every day | ORAL | Status: DC
  Administered 2021-07-14 – 2021-07-15 (×2): 40 mg via ORAL
  Filled 2021-07-14 (×2): qty 1

## 2021-07-14 MED ORDER — BUPIVACAINE IN DEXTROSE 0.75-8.25 % IT SOLN
INTRATHECAL | Status: DC | PRN
  Administered 2021-07-14: 1.8 mL via INTRATHECAL

## 2021-07-14 MED ORDER — EMTRICITABINE-TENOFOVIR AF 200-25 MG PO TABS
1.0000 | ORAL_TABLET | Freq: Every day | ORAL | Status: DC
  Administered 2021-07-15: 1 via ORAL
  Filled 2021-07-14: qty 1

## 2021-07-14 MED ORDER — DIPHENHYDRAMINE HCL 12.5 MG/5ML PO ELIX
12.5000 mg | ORAL_SOLUTION | ORAL | Status: DC | PRN
  Administered 2021-07-15: 25 mg via ORAL
  Filled 2021-07-14: qty 10

## 2021-07-14 MED ORDER — OXYCODONE HCL 5 MG PO TABS
5.0000 mg | ORAL_TABLET | ORAL | Status: DC | PRN
  Filled 2021-07-14: qty 2

## 2021-07-14 MED ORDER — MIDAZOLAM HCL 5 MG/5ML IJ SOLN
INTRAMUSCULAR | Status: DC | PRN
  Administered 2021-07-14: 2 mg via INTRAVENOUS

## 2021-07-14 MED ORDER — ALUM & MAG HYDROXIDE-SIMETH 200-200-20 MG/5ML PO SUSP: 30.0000 mL | ORAL | Status: DC | PRN

## 2021-07-14 MED ORDER — ALPRAZOLAM 0.25 MG PO TABS
0.2500 mg | ORAL_TABLET | Freq: Every evening | ORAL | Status: DC | PRN
  Administered 2021-07-15: 0.25 mg via ORAL
  Filled 2021-07-14: qty 1

## 2021-07-14 MED ORDER — PROPOFOL 500 MG/50ML IV EMUL
INTRAVENOUS | Status: AC
  Filled 2021-07-14: qty 50

## 2021-07-14 MED ORDER — MIDAZOLAM HCL 2 MG/2ML IJ SOLN
INTRAMUSCULAR | Status: AC
  Filled 2021-07-14: qty 2

## 2021-07-14 MED ORDER — MENTHOL 3 MG MT LOZG: 1.0000 | LOZENGE | OROMUCOSAL | Status: DC | PRN

## 2021-07-14 MED ORDER — LACTATED RINGERS IV SOLN: INTRAVENOUS | Status: DC

## 2021-07-14 MED ORDER — TIZANIDINE HCL 4 MG PO TABS
4.0000 mg | ORAL_TABLET | Freq: Four times a day (QID) | ORAL | Status: DC | PRN
  Administered 2021-07-15 (×2): 4 mg via ORAL
  Filled 2021-07-14 (×2): qty 1

## 2021-07-14 MED ORDER — HYDROMORPHONE HCL 1 MG/ML IJ SOLN
0.5000 mg | INTRAMUSCULAR | Status: DC | PRN
  Administered 2021-07-14 (×2): 1 mg via INTRAVENOUS
  Filled 2021-07-14 (×2): qty 1

## 2021-07-14 MED ORDER — ADULT MULTIVITAMIN W/MINERALS CH
1.0000 | ORAL_TABLET | Freq: Every day | ORAL | Status: DC
  Administered 2021-07-15: 1 via ORAL
  Filled 2021-07-14: qty 1

## 2021-07-14 SURGICAL SUPPLY — 41 items
APL SKNCLS STERI-STRIP NONHPOA (GAUZE/BANDAGES/DRESSINGS)
BAG COUNTER SPONGE SURGICOUNT (BAG) ×2 IMPLANT
BAG SPEC THK2 15X12 ZIP CLS (MISCELLANEOUS)
BAG SPNG CNTER NS LX DISP (BAG) ×1
BAG ZIPLOCK 12X15 (MISCELLANEOUS) IMPLANT
BENZOIN TINCTURE PRP APPL 2/3 (GAUZE/BANDAGES/DRESSINGS) IMPLANT
BLADE SAW SGTL 18X1.27X75 (BLADE) ×2 IMPLANT
COVER PERINEAL POST (MISCELLANEOUS) ×2 IMPLANT
COVER SURGICAL LIGHT HANDLE (MISCELLANEOUS) ×2 IMPLANT
CUP SECTOR GRIPTON 50MM (Cup) ×2 IMPLANT
DRAPE FOOT SWITCH (DRAPES) ×2 IMPLANT
DRAPE STERI IOBAN 125X83 (DRAPES) ×2 IMPLANT
DRAPE U-SHAPE 47X51 STRL (DRAPES) ×4 IMPLANT
DRSG AQUACEL AG ADV 3.5X10 (GAUZE/BANDAGES/DRESSINGS) ×2 IMPLANT
DURAPREP 26ML APPLICATOR (WOUND CARE) ×2 IMPLANT
ELECT REM PT RETURN 15FT ADLT (MISCELLANEOUS) ×2 IMPLANT
GAUZE XEROFORM 1X8 LF (GAUZE/BANDAGES/DRESSINGS) ×2 IMPLANT
GLOVE SRG 8 PF TXTR STRL LF DI (GLOVE) ×2 IMPLANT
GLOVE SURG ENC MOIS LTX SZ7.5 (GLOVE) ×2 IMPLANT
GLOVE SURG LTX SZ8 (GLOVE) ×2 IMPLANT
GLOVE SURG UNDER POLY LF SZ8 (GLOVE) ×4
GOWN STRL REUS W/TWL XL LVL3 (GOWN DISPOSABLE) ×4 IMPLANT
HANDPIECE INTERPULSE COAX TIP (DISPOSABLE) ×2
HEAD FEMORAL 32 CERAMIC (Hips) ×2 IMPLANT
HOLDER FOLEY CATH W/STRAP (MISCELLANEOUS) ×2 IMPLANT
KIT TURNOVER KIT A (KITS) ×2 IMPLANT
LINER ACETABULAR 32X50 (Liner) ×2 IMPLANT
PACK ANTERIOR HIP CUSTOM (KITS) ×2 IMPLANT
PENCIL SMOKE EVACUATOR (MISCELLANEOUS) IMPLANT
SET HNDPC FAN SPRY TIP SCT (DISPOSABLE) ×1 IMPLANT
STAPLER VISISTAT 35W (STAPLE) IMPLANT
STEM FEM SZ3 STD ACTIS (Stem) ×2 IMPLANT
STRIP CLOSURE SKIN 1/2X4 (GAUZE/BANDAGES/DRESSINGS) IMPLANT
SUT ETHIBOND NAB CT1 #1 30IN (SUTURE) ×2 IMPLANT
SUT ETHILON 2 0 PS N (SUTURE) IMPLANT
SUT MNCRL AB 4-0 PS2 18 (SUTURE) IMPLANT
SUT VIC AB 0 CT1 36 (SUTURE) ×2 IMPLANT
SUT VIC AB 1 CT1 36 (SUTURE) ×2 IMPLANT
SUT VIC AB 2-0 CT1 27 (SUTURE) ×4
SUT VIC AB 2-0 CT1 TAPERPNT 27 (SUTURE) ×2 IMPLANT
TRAY FOLEY MTR SLVR 16FR STAT (SET/KITS/TRAYS/PACK) IMPLANT

## 2021-07-14 NOTE — Evaluation (Signed)
Physical Therapy Evaluation Patient Details Name: Ashley Shepherd MRN: [id number] DOB: [birth year] Today's Date: 07/14/2021   History of Present Illness  Patient is 65 y.o. female s/p Rt THA anterior approach on 07/14/21 with PMH significant for OA, anxiety, depression, GERD, anemia, HIV, Lt THA in 2015 with 3 revisions.    Clinical Impression  Ashley Shepherd is a 65 y.o. female POD 0 s/p Rt THA. Patient reports independence with mobility at baseline. Patient is now limited by functional impairments (see PT problem list below) and requires min assist for transfers and gait with RW. Patient was able to ambulate ~90 feet with RW and min assist. Patient instructed in exercise to facilitate circulation to manage edema and reduce risk of DVT. Patient will benefit from continued skilled PT interventions to address impairments and progress towards PLOF. Acute PT will follow to progress mobility and stair training in preparation for safe discharge home.     Follow Up Recommendations Follow surgeon's recommendation for DC plan and follow-up therapies;Home health PT    Equipment Recommendations  None recommended by PT    Recommendations for Other Services       Precautions / Restrictions Precautions Precautions: Fall Restrictions Weight Bearing Restrictions: No RLE Weight Bearing: Weight bearing as tolerated Other Position/Activity Restrictions: WBAT      Mobility  Bed Mobility Overal bed mobility: Needs Assistance Bed Mobility: Supine to Sit     Supine to sit: Min assist;HOB elevated     General bed mobility comments: assist for Rt LE to EOB, no overt LOB noted    Transfers Overall transfer level: Needs assistance Equipment used: Rolling walker (2 wheeled) Transfers: Sit to/from Stand              Ambulation/Gait Ambulation/Gait assistance: Min Web designer (Feet): 90 Feet Assistive device: Rolling walker (2 wheeled) Gait Pattern/deviations: Step-to  pattern;Step-through pattern;Decreased stride length;Decreased weight shift to right Gait velocity: decr   General Gait Details: cues for step to pattern and proximity to RW, pt progressed to step through pattern with no overt LOB noted. min assist needed to steady intermittently.  Stairs            Wheelchair Mobility    Modified Rankin (Stroke Patients Only)       Balance Overall balance assessment: Needs assistance Sitting-balance support: Feet supported Sitting balance-Leahy Scale: Good     Standing balance support: During functional activity;Bilateral upper extremity supported Standing balance-Leahy Scale: Poor                               Pertinent Vitals/Pain Pain Assessment: 0-10 Pain Score: 4  Pain Location: Rt hip Pain Descriptors / Indicators: Aching;Discomfort Pain Intervention(s): Limited activity within patient's tolerance;Monitored during session;Premedicated before session;Repositioned;Ice applied    Home Living Family/patient expects to be discharged to:: Private residence Living Arrangements: Spouse/significant other Available Help at Discharge: Family Type of Home: House Home Access: Stairs to enter   Technical brewer of Steps: 1+1 Home Layout: One level Home Equipment: Environmental consultant - 2 wheels;Cane - single point;Shower seat Additional Comments: fiance and 2 daughters will help her at home and assist her otehr daughter    Prior Function Level of Independence: Independent         Comments: takes care of her adult daughter who has special needs.     Hand Dominance   Dominant Hand: Right    Extremity/Trunk Assessment   Upper Extremity Assessment Upper  Extremity Assessment: Overall WFL for tasks assessed    Lower Extremity Assessment Lower Extremity Assessment: RLE deficits/detail RLE Deficits / Details: good quad activation, 4/5 for dorsi/plantar flexion RLE Sensation: WNL RLE Coordination: WNL    Cervical / Trunk  Assessment Cervical / Trunk Assessment: Normal  Communication   Communication: No difficulties  Cognition Arousal/Alertness: Awake/alert Behavior During Therapy: WFL for tasks assessed/performed Overall Cognitive Status: Within Functional Limits for tasks assessed                                        General Comments      Exercises Total Joint Exercises Ankle Circles/Pumps: AROM;Both;20 reps;Seated   Assessment/Plan    PT Assessment Patient needs continued PT services  PT Problem List Decreased strength;Decreased range of motion;Decreased activity tolerance;Decreased balance;Decreased mobility;Decreased knowledge of use of DME;Decreased knowledge of precautions       PT Treatment Interventions DME instruction;Gait training;Stair training;Functional mobility training;Therapeutic activities;Therapeutic exercise;Balance training;Patient/family education    PT Goals (Current goals can be found in the Care Plan section)  Acute Rehab PT Goals Patient Stated Goal: regain independence PT Goal Formulation: With patient Time For Goal Achievement: 07/21/21 Potential to Achieve Goals: Good    Frequency 7X/week   Barriers to discharge        Co-evaluation               AM-PAC PT "6 Clicks" Mobility  Outcome Measure Help needed turning from your back to your side while in a flat bed without using bedrails?: A Little Help needed moving from lying on your back to sitting on the side of a flat bed without using bedrails?: A Little Help needed moving to and from a bed to a chair (including a wheelchair)?: A Little Help needed standing up from a chair using your arms (e.g., wheelchair or bedside chair)?: A Little Help needed to walk in hospital room?: A Little Help needed climbing 3-5 steps with a railing? : A Little 6 Click Score: 18    End of Session Equipment Utilized During Treatment: Gait belt Activity Tolerance: Patient tolerated treatment well Patient  left: in chair;with call bell/phone within reach;with chair alarm set Nurse Communication: Mobility status PT Visit Diagnosis: Muscle weakness (generalized) (M62.81);Difficulty in walking, not elsewhere classified (R26.2)    Time: [id number] PT Time Calculation (min) (ACUTE ONLY): 18 min   Charges:   PT Evaluation $PT Eval Low Complexity: 1 Low          Verner Mould, DPT Acute Rehabilitation Services Office [phone] Pager [phone]   Jacques Navy 07/14/2021, 4:51 PM

## 2021-07-14 NOTE — Interval H&P Note (Signed)
History and Physical Interval Note: Understands that she is here today for a right total hip arthroplasty to treat her right hip avascular necrosis.  There has been no interval or acute change in her medical status.  See recent H&P.  The risks and benefits of surgery have been explained in detail and informed consent is obtained.  The right hip has been marked.  07/14/2021 9:46 AM  Ashley Shepherd  has presented today for surgery, with the diagnosis of avascular necrosis right hip.  The various methods of treatment have been discussed with the patient and family. After consideration of risks, benefits and other options for treatment, the patient has consented to  Procedure(s): RIGHT TOTAL HIP ARTHROPLASTY ANTERIOR APPROACH (Right) as a surgical intervention.  The patient's history has been reviewed, patient examined, no change in status, stable for surgery.  I have reviewed the patient's chart and labs.  Questions were answered to the patient's satisfaction.     Mcarthur Rossetti

## 2021-07-14 NOTE — Anesthesia Procedure Notes (Signed)
Spinal  Patient location during procedure: OR Start time: 07/14/2021 10:50 AM End time: 07/14/2021 10:53 AM Reason for block: surgical anesthesia Staffing Anesthesiologist: Janeece Riggers, MD Preanesthetic Checklist Completed: patient identified, IV checked, site marked, risks and benefits discussed, surgical consent, monitors and equipment checked, pre-op evaluation and timeout performed Spinal Block Patient position: sitting Prep: DuraPrep Patient monitoring: heart rate, cardiac monitor, continuous pulse ox and blood pressure Approach: midline Location: L3-4 Injection technique: single-shot Needle Needle type: Sprotte  Needle gauge: 24 G Needle length: 9 cm Assessment Sensory level: T4 Events: CSF return

## 2021-07-14 NOTE — Anesthesia Preprocedure Evaluation (Addendum)
Anesthesia Evaluation  Patient identified by MRN, date of birth, ID band Patient awake    Reviewed: Allergy & Precautions, H&P , NPO status , Patient's Chart, lab work & pertinent test results, reviewed documented beta blocker date and time   Airway Mallampati: I  TM Distance: >3 FB Neck ROM: full    Dental no notable dental hx. (+) Edentulous Upper, Edentulous Lower   Pulmonary Current Smoker and Patient abstained from smoking.,    Pulmonary exam normal breath sounds clear to auscultation       Cardiovascular negative cardio ROS Normal cardiovascular exam Rhythm:regular Rate:Normal     Neuro/Psych PSYCHIATRIC DISORDERS Anxiety Depression negative neurological ROS     GI/Hepatic Neg liver ROS, GERD  Medicated,  Endo/Other  negative endocrine ROS  Renal/GU Nephrolithiasis     Musculoskeletal negative musculoskeletal ROS (+) Arthritis , Osteoarthritis,    Abdominal   Peds  Hematology  (+) Blood dyscrasia, anemia , HIV,   Anesthesia Other Findings HIV, hx cancer now in remission, avascular necrosis of left hip  Reproductive/Obstetrics negative OB ROS                            Anesthesia Physical  Anesthesia Plan  ASA: 3  Anesthesia Plan: MAC and Spinal   Post-op Pain Management:    Induction: Intravenous  PONV Risk Score and Plan: 1 and Ondansetron, Treatment may vary due to age or medical condition and Propofol infusion  Airway Management Planned: Natural Airway and Nasal Cannula  Additional Equipment: None  Intra-op Plan:   Post-operative Plan:   Informed Consent: I have reviewed the patients History and Physical, chart, labs and discussed the procedure including the risks, benefits and alternatives for the proposed anesthesia with the patient or authorized representative who has indicated his/her understanding and acceptance.       Plan Discussed with: CRNA, Surgeon and  Anesthesiologist  Anesthesia Plan Comments: ( )      Anesthesia Quick Evaluation

## 2021-07-14 NOTE — Anesthesia Postprocedure Evaluation (Signed)
Anesthesia Post Note  Patient: Atlee J Muscat  Procedure(s) Performed: RIGHT TOTAL HIP ARTHROPLASTY ANTERIOR APPROACH (Right: Hip)     Patient location during evaluation: PACU Anesthesia Type: MAC Level of consciousness: awake and alert Pain management: pain level controlled Vital Signs Assessment: post-procedure vital signs reviewed and stable Respiratory status: spontaneous breathing, nonlabored ventilation, respiratory function stable and patient connected to nasal cannula oxygen Cardiovascular status: stable and blood pressure returned to baseline Postop Assessment: no apparent nausea or vomiting Anesthetic complications: no   No notable events documented.  Last Vitals:  Vitals:   07/14/21 1340 07/14/21 1418  BP: 140/76 (!) 145/75  Pulse: 64 64  Resp: 15 16  Temp: (!) 36.4 C 36.5 C  SpO2: 100% 99%    Last Pain:  Vitals:   07/14/21 1418  TempSrc: Oral  PainSc:                  Kinberly Perris

## 2021-07-14 NOTE — Op Note (Signed)
NAME: Ashley Shepherd, Ashley Shepherd RECORD NO: [id number] ACCOUNT NO: [id number] DATE OF BIRTH: February 04, 1956 FACILITY: [facility]: [facility] PHYSICIAN: Lind Guest. Ninfa Linden, MD  Operative Report   DATE OF PROCEDURE: 07/14/2021  PREOPERATIVE DIAGNOSIS:  End-stage avascular necrosis, right hip.  POSTOPERATIVE DIAGNOSIS:  End-stage avascular necrosis, right hip.  PROCEDURE:  Right total hip arthroplasty through a direct anterior approach.  IMPLANTS:  DePuy sector Gription acetabular component size 50, size 32+0 neutral polyethylene liner, size 3 ACTIS femoral component with standard offset, size 32+1 ceramic hip ball.  SURGEON:  Lind Guest. Ninfa Linden, MD  ASSISTANT:  Benita Stabile, PA-C.  ANESTHESIA:  Spinal.  ANTIBIOTICS:  2 g IV Ancef.  ESTIMATED BLOOD LOSS:  100-150 mL.  COMPLICATIONS:  None.  INDICATIONS:  The patient is a 65 year old female well known to me.  She actually has a history of bilateral hip avascular necrosis and about 6 or more years ago underwent a left total hip arthroplasty.  Her postoperative course was complicated by strep  infection and she had to have revision arthroplasty and long-term IV antibiotics.  She was hesitant to proceed for many years with a right total hip arthroplasty given even though her pain was severe in her hip showed severe AVN.  Due to the detrimental  effect now this hip pain is having on her quality of life, on her mobility, activities of daily living she does finally wished to proceed with a total hip arthroplasty on the right side.  She was fully aware of the risk of acute blood loss anemia, nerve  or vessel injury, fracture, infection, dislocation, DVT and implant failure.  She understands she does have a higher risk of infection given her HIV status, even though she has an undetectable viral load and normal CD4 count, has been healthy otherwise.   She has no active other infections in her body.  She understands our goals are to decrease pain,  improved mobility and overall improved quality of life.  DESCRIPTION OF PROCEDURE:  After informed consent was obtained, appropriate right hip was marked.  She was brought to the operating room and sat on the stretcher.  Spinal anesthesia was obtained.  She was laid in supine position on the stretcher.  We  assessed her leg length and she is definitely shorter on his right side.  A Foley catheter was placed and traction boots were placed on both her feet.  Next, she was placed supine on the Hana fracture table, the perineal post in place and both legs in  line skeletal traction device and no traction applied.  Her right operative hip was prepped and draped in DuraPrep and sterile drapes.  A timeout was called and she was identified correct patient, correct right hip.  I then made an incision just inferior  and posterior to the anterior superior iliac spine and carried this obliquely down the leg.  We dissected down tensor fascia lata muscle.  Tensor fascia was then divided longitudinally to proceed with direct anterior approach to the hip.  We identified  and cauterized circumflex vessels then identified the hip capsule, opened the hip capsule in L-type format finding a moderate joint effusion and significant disease around her femoral head and neck.  We placed Cobra retractors within the joint capsule  around the medial and lateral femoral neck and made our femoral neck cut with the oscillating saw, proximal to the lesser trochanter and completed this with an osteotome.  We placed a corkscrew guide in the femoral head and the  femoral head was split  apart spread and pulled out in pieces.  Given the severity of her AVN it was not hard to take out.  We then placed a bent Hohmann over the medial acetabular rim and removed remnants of the acetabular labrum and other debris.  We then began reaming under  direct visualization from a size 43 reamer in a stepwise increments going up to size 49 reamer.  With all  reamers placed under direct visualization the last reamer was also placed under direct fluoroscopy, so I could obtain my depth of reaming my  inclination and anteversion.  I then placed a real DePuy sector Gription acetabular component size 50 and a 32+0 neutral polyethylene liner for that size acetabular component.  Attention was then turned to the femur with the leg externally rotated to 120  degrees and extended and adducted, we were place a Mueller retractor medially and Hohman retractor behind the greater trochanter. We released lateral joint capsule and used a box cutting osteotome to enter the femoral canal and a rongeur to lateralize.   We then began broaching using the ACTIS broaching system from a size 0 going up to size 3 with a size 3 in place we trialed a standard offset femoral neck and a 32+1 trial hip ball.  We brought the leg back over in upward traction and internal rotation  reducing the pelvis.  It was very tight; however, we definitely increased her leg length and offset.  Of note, she has been significantly short before.  We dislocated the hip and removed the trial components.  We placed the real ACTIS femoral component  with standard offset size 3 and the real 32+1 ceramic hip ball and again reduced this in the acetabulum.  We assessed radiographically and mechanically.  We were pleased with leg length, offset, range of motion and stability as well as offset.  We then  irrigated this tissue with normal saline solution using pulsatile lavage.  We closed the remnants of the joint capsule with interrupted #1 Ethibond suture followed by #1 Vicryl to close the tensor fascia.  0 Vicryl was used to close deep tissue and 2-0  Vicryl was used to close subcutaneous tissue.  The skin was closed with staples.  An Aquacel dressing was applied.  She was taken off the Hana table and taken to recovery room in stable condition with all final counts being correct.  No complications  noted.  Of note, Benita Stabile, PA-C did assist during the entire case.  His assistance was crucial for facilitating every aspect of this case.   PUS D: 07/14/2021 12:13:06 pm T: 07/14/2021 9:22:00 pm  JOB: [id number]

## 2021-07-14 NOTE — Plan of Care (Signed)

## 2021-07-14 NOTE — Transfer of Care (Signed)
Immediate Anesthesia Transfer of Care Note  Patient: Ashley Shepherd  Procedure(s) Performed: RIGHT TOTAL HIP ARTHROPLASTY ANTERIOR APPROACH (Right: Hip)  Patient Location: PACU  Anesthesia Type:Spinal  Level of Consciousness: awake, alert  and oriented  Airway & Oxygen Therapy: Patient Spontanous Breathing and Patient connected to face mask oxygen  Post-op Assessment: Report given to RN and Post -op Vital signs reviewed and stable  Post vital signs: Reviewed and stable  Last Vitals:  Vitals Value Taken Time  BP    Temp    Pulse 62 07/14/21 1233  Resp 11 07/14/21 1233  SpO2 100 % 07/14/21 1233  Vitals shown include unvalidated device data.  Last Pain:  Vitals:   07/14/21 0901  TempSrc:   PainSc: 0-No pain         Complications: No notable events documented.

## 2021-07-14 NOTE — Brief Op Note (Signed)
07/14/2021  12:14 PM  PATIENT:  Ashley Shepherd  65 y.o. female  PRE-OPERATIVE DIAGNOSIS:  avascular necrosis right hip  POST-OPERATIVE DIAGNOSIS:  avascular necrosis right hip  PROCEDURE:  Procedure(s): RIGHT TOTAL HIP ARTHROPLASTY ANTERIOR APPROACH (Right)  SURGEON:  Surgeon(s) and Role:    Mcarthur Rossetti, MD - Primary  PHYSICIAN ASSISTANT:  Benita Stabile, PA-C  ANESTHESIA:   spinal  EBL:  100 mL   COUNTS:  YES  DICTATION: .Other Dictation: Dictation Number [id number]  PLAN OF CARE: Admit for overnight observation  PATIENT DISPOSITION:  PACU - hemodynamically stable.   Delay start of Pharmacological VTE agent (>24hrs) due to surgical blood loss or risk of bleeding: no

## 2021-07-15 DIAGNOSIS — M1611 Unilateral primary osteoarthritis, right hip: Secondary | ICD-10-CM | POA: Diagnosis not present

## 2021-07-15 LAB — CBC
HCT: 35.6 % — ABNORMAL LOW (ref 36.0–46.0)
Hemoglobin: 11.9 g/dL — ABNORMAL LOW (ref 12.0–15.0)
MCH: 31.2 pg (ref 26.0–34.0)
MCHC: 33.4 g/dL (ref 30.0–36.0)
MCV: 93.4 fL (ref 80.0–100.0)
Platelets: 166 10*3/uL (ref 150–400)
RBC: 3.81 MIL/uL — ABNORMAL LOW (ref 3.87–5.11)
RDW: 14.4 % (ref 11.5–15.5)
WBC: 11.3 10*3/uL — ABNORMAL HIGH (ref 4.0–10.5)
nRBC: 0 % (ref 0.0–0.2)

## 2021-07-15 LAB — BASIC METABOLIC PANEL
Anion gap: 5 (ref 5–15)
BUN: 13 mg/dL (ref 8–23)
CO2: 27 mmol/L (ref 22–32)
Calcium: 8.6 mg/dL — ABNORMAL LOW (ref 8.9–10.3)
Chloride: 108 mmol/L (ref 98–111)
Creatinine, Ser: 1.11 mg/dL — ABNORMAL HIGH (ref 0.44–1.00)
GFR, Estimated: 55 mL/min — ABNORMAL LOW (ref >60)
Glucose, Bld: 145 mg/dL — ABNORMAL HIGH (ref 70–99)
Potassium: 4.4 mmol/L (ref 3.5–5.1)
Sodium: 140 mmol/L (ref 135–145)

## 2021-07-15 MED ORDER — ASPIRIN 81 MG PO CHEW: 81.0000 mg | CHEWABLE_TABLET | Freq: Two times a day (BID) | ORAL | 0 refills | Status: AC

## 2021-07-15 MED ORDER — TIZANIDINE HCL 4 MG PO TABS: 4.0000 mg | ORAL_TABLET | Freq: Three times a day (TID) | ORAL | 0 refills | Status: DC | PRN

## 2021-07-15 MED ORDER — OXYCODONE HCL 5 MG PO TABS: 5.0000 mg | ORAL_TABLET | ORAL | 0 refills | Status: DC | PRN

## 2021-07-15 NOTE — Plan of Care (Signed)
  Problem: Education: Goal: Knowledge of General Education information will improve Description Including pain rating scale, medication(s)/side effects and non-pharmacologic comfort measures Outcome: Progressing   Problem: Health Behavior/Discharge Planning: Goal: Ability to manage health-related needs will improve Outcome: Progressing   

## 2021-07-15 NOTE — Progress Notes (Signed)
Physical Therapy Treatment Patient Details Name: Ashley Shepherd MRN: [id number] DOB: 16-Apr-1956 Today's Date: 07/15/2021    History of Present Illness Patient is 65 y.o. female s/p Rt THA anterior approach on 07/14/21 with PMH significant for OA, anxiety, depression, GERD, anemia, HIV, Lt THA in 2015 with 3 revisions.    PT Comments    Pt assisted with ambulating in hallway and practiced one step.  Pt anticipates d/c home later today so will return to ambulate again and perform HEP.    Follow Up Recommendations  Follow surgeon's recommendation for DC plan and follow-up therapies;Home health PT     Equipment Recommendations  None recommended by PT    Recommendations for Other Services       Precautions / Restrictions Precautions Precautions: Fall Restrictions Weight Bearing Restrictions: No RLE Weight Bearing: Weight bearing as tolerated    Mobility  Bed Mobility Overal bed mobility: Needs Assistance Bed Mobility: Supine to Sit     Supine to sit: Supervision;HOB elevated     General bed mobility comments: increased time and effort    Transfers Overall transfer level: Needs assistance Equipment used: Rolling walker (2 wheeled) Transfers: Sit to/from Stand Sit to Stand: Min guard         General transfer comment: verbal cues for UE and LE positioning  Ambulation/Gait Ambulation/Gait assistance: Min guard Gait Distance (Feet): 140 Feet Assistive device: Rolling walker (2 wheeled) Gait Pattern/deviations: Step-to pattern;Decreased stride length;Decreased weight shift to right;Step-through pattern Gait velocity: decr   General Gait Details: verbal cues for sequence, RW positioning, step length, posture   Stairs Stairs: Yes Stairs assistance: Min guard Stair Management: Forwards;Step to pattern;With walker Number of Stairs: 1 General stair comments: verbal cues for sequence, safety, RW positioning; pt performed twice   Wheelchair Mobility    Modified  Rankin (Stroke Patients Only)       Balance                                            Cognition Arousal/Alertness: Awake/alert Behavior During Therapy: WFL for tasks assessed/performed Overall Cognitive Status: Within Functional Limits for tasks assessed                                        Exercises      General Comments        Pertinent Vitals/Pain Pain Assessment: 0-10 Pain Score: 3  Pain Location: Rt hip Pain Descriptors / Indicators: Aching;Sore Pain Intervention(s): Repositioned;Monitored during session    Home Living                      Prior Function            PT Goals (current goals can now be found in the care plan section) Progress towards PT goals: Progressing toward goals    Frequency    7X/week      PT Plan Current plan remains appropriate    Co-evaluation              AM-PAC PT "6 Clicks" Mobility   Outcome Measure  Help needed turning from your back to your side while in a flat bed without using bedrails?: A Little Help needed moving from lying on your back to sitting on the side of  a flat bed without using bedrails?: A Little Help needed moving to and from a bed to a chair (including a wheelchair)?: A Little Help needed standing up from a chair using your arms (e.g., wheelchair or bedside chair)?: A Little Help needed to walk in hospital room?: A Little Help needed climbing 3-5 steps with a railing? : A Little 6 Click Score: 18    End of Session Equipment Utilized During Treatment: Gait belt Activity Tolerance: Patient tolerated treatment well Patient left: in chair;with call bell/phone within reach;with chair alarm set Nurse Communication: Mobility status PT Visit Diagnosis: Muscle weakness (generalized) (M62.81);Difficulty in walking, not elsewhere classified (R26.2)     Time: [id number] PT Time Calculation (min) (ACUTE ONLY): 20 min  Charges:  $Gait Training: 8-22  mins                     Arlyce Dice, DPT Acute Rehabilitation Services Pager: [phone] Office: [phone]   Trena Platt 07/15/2021, 12:46 PM

## 2021-07-15 NOTE — TOC Transition Note (Signed)
Transition of Care [facility]) - CM/SW Discharge Note   Patient Details  Name: Ashley Shepherd MRN: [id number] Date of Birth: 05/14/1956  [facility] CM/SW Contact:  Ross Ludwig, LCSW Phone Number: 07/15/2021, 2:28 PM   Clinical Narrative:     Patient will be going home with home health through [place].  CSW signing off please reconsult with any other social work needs, home health agency has been notified of planned discharge.   Final next level of care: [place] Barriers to Discharge: Barriers Resolved   Patient Goals and CMS Choice Patient states their goals for this hospitalization and ongoing recovery are:: To return home with home health services. CMS Medicare.gov Compare Post Acute Care list provided to:: Patient Choice offered to / list presented to : Patient  Discharge Placement  Patient discharging home with home health.                     Discharge Plan and Services                DME Arranged: 3-N-1, Walker rolling   Date DME Agency Contacted: 07/14/21 Time DME Agency Contacted: [id number]   [place]: PT HH Agency: [place] Date HH Agency Contacted: 07/15/21 Time [place]: [id number] Representative spoke with at [place]: Ronalee Belts  Social Determinants of Health ([place]) Interventions     Readmission Risk Interventions No flowsheet data found.

## 2021-07-15 NOTE — Plan of Care (Signed)

## 2021-07-15 NOTE — Progress Notes (Signed)
Subjective: 1 Day Post-Op Procedure(s) (LRB): RIGHT TOTAL HIP ARTHROPLASTY ANTERIOR APPROACH (Right) Patient reports pain as moderate.    Objective: Vital signs in last 24 hours: Temp:  [97.3 F (36.3 C)-98.3 F (36.8 C)] 98 F (36.7 C) (07/23 0546) Pulse Rate:  [49-69] 64 (07/23 0546) Resp:  [10-19] 16 (07/23 0546) BP: (113-151)/(65-87) 129/82 (07/23 0546) SpO2:  [96 %-100 %] 97 % (07/23 0546)  Intake/Output from previous day: 07/22 0701 - 07/23 0700 In: 2904.4 [P.O.:30; I.V.:2768; IV Piggyback:106.4] Out: 2250 [Urine:2150; Blood:100] Intake/Output this shift: No intake/output data recorded.  Recent Labs    07/15/21 0225  HGB 11.9*   Recent Labs    07/15/21 0225  WBC 11.3*  RBC 3.81*  HCT 35.6*  PLT 166   Recent Labs    07/15/21 0225  NA 140  K 4.4  CL 108  CO2 27  BUN 13  CREATININE 1.11*  GLUCOSE 145*  CALCIUM 8.6*   No results for input(s): LABPT, INR in the last 72 hours.  Sensation intact distally Intact pulses distally Dorsiflexion/Plantar flexion intact Incision: scant drainage   Assessment/Plan: 1 Day Post-Op Procedure(s) (LRB): RIGHT TOTAL HIP ARTHROPLASTY ANTERIOR APPROACH (Right) Up with therapy Discharge home with home health      Mcarthur Rossetti 07/15/2021, 9:08 AM

## 2021-07-15 NOTE — Discharge Instructions (Signed)

## 2021-07-15 NOTE — Progress Notes (Signed)
Physical Therapy Treatment Patient Details Name: Ashley Shepherd MRN: [id number] DOB: 03/20/1956 Today's Date: 07/15/2021    History of Present Illness Patient is 65 y.o. female s/p Rt THA anterior approach on 07/14/21 with PMH significant for OA, anxiety, depression, GERD, anemia, HIV, Lt THA in 2015 with 3 revisions.    PT Comments    Pt assisted to bathroom and then ambulated in hallway. Pt also performed LE exercises.  Pt provided with HEP handout.  Pt had no further questions and feels ready for d/c home today.    Follow Up Recommendations  Follow surgeon's recommendation for DC plan and follow-up therapies;Home health PT     Equipment Recommendations  None recommended by PT    Recommendations for Other Services       Precautions / Restrictions Precautions Precautions: Fall Restrictions RLE Weight Bearing: Weight bearing as tolerated    Mobility  Bed Mobility    Transfers Overall transfer level: Needs assistance Equipment used: Rolling walker (2 wheeled) Transfers: Sit to/from Stand Sit to Stand: Min guard         General transfer comment: verbal cues for UE and LE positioning  Ambulation/Gait Ambulation/Gait assistance: Min guard Gait Distance (Feet): 160 Feet Assistive device: Rolling walker (2 wheeled) Gait Pattern/deviations: Step-to pattern;Decreased stride length;Decreased weight shift to right;Step-through pattern Gait velocity: decr   General Gait Details: verbal cues for sequence, RW positioning, step length, posture   Stairs    Wheelchair Mobility    Modified Rankin (Stroke Patients Only)       Balance                                            Cognition Arousal/Alertness: Awake/alert Behavior During Therapy: WFL for tasks assessed/performed Overall Cognitive Status: Within Functional Limits for tasks assessed                                        Exercises Total Joint Exercises Ankle  Circles/Pumps: AROM;Both;10 reps Quad Sets: AROM;Right;10 reps Heel Slides: AAROM;Right;10 reps Hip ABduction/ADduction: AAROM;Right;10 reps Long Arc Quad: AROM;Right;10 reps;Seated Knee Flexion: AROM;Right;Standing;10 reps Marching in Standing: AROM;Right;Standing;10 reps Standing Hip Extension: AROM;Right;Standing;10 reps    General Comments        Pertinent Vitals/Pain Pain Assessment: 0-10 Pain Score: 4  Pain Location: Rt hip Pain Descriptors / Indicators: Aching;Sore Pain Intervention(s): Repositioned;Monitored during session;Premedicated before session    Home Living                      Prior Function            PT Goals (current goals can now be found in the care plan section) Progress towards PT goals: Progressing toward goals    Frequency    7X/week      PT Plan Current plan remains appropriate    Co-evaluation              AM-PAC PT "6 Clicks" Mobility   Outcome Measure  Help needed turning from your back to your side while in a flat bed without using bedrails?: A Little Help needed moving from lying on your back to sitting on the side of a flat bed without using bedrails?: A Little Help needed moving to and from a bed to a  chair (including a wheelchair)?: A Little Help needed standing up from a chair using your arms (e.g., wheelchair or bedside chair)?: A Little Help needed to walk in hospital room?: A Little Help needed climbing 3-5 steps with a railing? : A Little 6 Click Score: 18    End of Session Equipment Utilized During Treatment: Gait belt Activity Tolerance: Patient tolerated treatment well Patient left: in chair;with call bell/phone within reach;with chair alarm set;with family/visitor present Nurse Communication: Mobility status PT Visit Diagnosis: Muscle weakness (generalized) (M62.81);Difficulty in walking, not elsewhere classified (R26.2)     Time: [id number] PT Time Calculation (min) (ACUTE ONLY): 24  min  Charges:  $Gait Training: 8-22 mins $Therapeutic Exercise: 8-22 mins                     Jannette Spanner PT, DPT Acute Rehabilitation Services Pager: [phone] Office: [phone]  York Ram E 07/15/2021, 3:41 PM

## 2021-07-15 NOTE — Progress Notes (Signed)
The patient is alert and oriented and has been seen by her physician. The orders for discharge were written. IV has been removed. Went over discharge instructions with patient and family. She is being discharged via wheelchair with all of her belongings.  

## 2021-07-16 NOTE — Discharge Summary (Signed)
Patient ID: Ashley Shepherd MRN: [id number] DOB/AGE: August 26, 1956 65 y.o.  Admit date: 07/14/2021 Discharge date: 07/16/2021  Admission Diagnoses:  Principal Problem:   Avascular necrosis of right femoral head ([place]) Active Problems:   Status post total hip replacement, right   Discharge Diagnoses:  Same  Past Medical History:  Diagnosis Date   Anemia    Anxiety    Arthritis    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[place])    left   Cancer ([place])    7 years ago, cancer free now   Depression    Difficulty sleeping    GERD (gastroesophageal reflux disease)    History of kidney stones    History of transfusion    HIV disease ([place])    Kidney stones    Pneumonia     Surgeries: Procedure(s): RIGHT TOTAL HIP ARTHROPLASTY ANTERIOR APPROACH on 07/14/2021   Consultants:   Discharged Condition: Improved  Hospital Course: Ashley Shepherd is an 65 y.o. female who was admitted 07/14/2021 for operative treatment ofAvascular necrosis of right femoral head ([place]). Patient has severe unremitting pain that affects sleep, daily activities, and work/hobbies. After pre-op clearance the patient was taken to the operating room on 07/14/2021 and underwent  Procedure(s): RIGHT TOTAL HIP ARTHROPLASTY ANTERIOR APPROACH.    Patient was given perioperative antibiotics:  Anti-infectives (From admission, onward)    Start     Dose/Rate Route Frequency Ordered Stop   07/15/21 1000  emtricitabine-tenofovir AF (DESCOVY) 200-25 MG per tablet 1 tablet  Status:  Discontinued        1 tablet Oral Daily 07/14/21 1421 07/15/21 2049   07/14/21 2200  dolutegravir (TIVICAY) tablet 50 mg  Status:  Discontinued        50 mg Oral 2 times daily 07/14/21 1421 07/15/21 2049   07/14/21 2200  maraviroc (SELZENTRY) tablet 300 mg  Status:  Discontinued        300 mg Oral 2 times daily 07/14/21 1421 07/15/21 2049   07/14/21 1700  ceFAZolin (ANCEF) IVPB 1 g/50 mL premix        1 g 100 mL/hr over 30 Minutes Intravenous Every 6 hours 07/14/21 1421  07/14/21 2206   07/14/21 0900  ceFAZolin (ANCEF) 2 g in sodium chloride 0.9 % 100 mL IVPB        2 g 200 mL/hr over 30 Minutes Intravenous On call to O.R. 07/14/21 0845 07/14/21 1054        Patient was given sequential compression devices, early ambulation, and chemoprophylaxis to prevent DVT.  Patient benefited maximally from hospital stay and there were no complications.    Recent vital signs: Patient Vitals for the past 24 hrs:  BP Pulse Resp SpO2  07/15/21 1417 126/65 69 18 100 %     Recent laboratory studies:  Recent Labs    07/15/21 0225  WBC 11.3*  HGB 11.9*  HCT 35.6*  PLT 166  NA 140  K 4.4  CL 108  CO2 27  BUN 13  CREATININE 1.11*  GLUCOSE 145*  CALCIUM 8.6*     Discharge Medications:   Allergies as of 07/15/2021       Reactions   Iodine Itching   States that still can get the dye but it has to be mixed with something like benadryl to prevent the itching   Iodinated Diagnostic Agents Itching   Pt stated that she had NO problem/reaction to topical iodine/betadine solution.   Sulfa Antibiotics Itching   Codeine Itching  Medication List     STOP taking these medications    aspirin 81 MG tablet Replaced by: aspirin 81 MG chewable tablet       TAKE these medications    ALPRAZolam 0.25 MG tablet Commonly known as: XANAX Take 0.25 mg by mouth at bedtime as needed for anxiety or sleep.   aspirin 81 MG chewable tablet Chew 1 tablet (81 mg total) by mouth 2 (two) times daily. Replaces: aspirin 81 MG tablet   citalopram 20 MG tablet Commonly known as: CELEXA Take 20 mg by mouth daily.   cyclobenzaprine 10 MG tablet Commonly known as: FLEXERIL TAKE 2 TABLETS(20 MG) BY MOUTH THREE TIMES DAILY AS NEEDED FOR MUSCLE SPASMS. MAX DOSE IS 60 MG D- TRY TO TAKE 2 TIMES PER DAY FOR MUSCLE SPASMS   dolutegravir 50 MG tablet Commonly known as: TIVICAY Take 50 mg by mouth 2 (two) times daily.   emtricitabine-tenofovir AF 200-25 MG  tablet Commonly known as: DESCOVY Take 1 tablet by mouth daily.   ferrous sulfate 325 (65 FE) MG tablet Take 325 mg by mouth daily with breakfast.   maraviroc 300 MG tablet Commonly known as: SELZENTRY Take 300 mg by mouth 2 (two) times daily.   multivitamin with minerals Tabs tablet Take 1 tablet by mouth daily.   oxyCODONE 5 MG immediate release tablet Commonly known as: Roxicodone Take 1-2 tablets (5-10 mg total) by mouth every 4 (four) hours as needed for severe pain. What changed:  how much to take when to take this   tiZANidine 4 MG tablet Commonly known as: ZANAFLEX Take 1 tablet (4 mg total) by mouth every 8 (eight) hours as needed. What changed: See the new instructions.        Diagnostic Studies: DG Pelvis Portable  Result Date: 07/14/2021 CLINICAL DATA:  Right hip replacement. EXAM: PORTABLE PELVIS 1-2 VIEWS COMPARISON:  Right hip x-rays dated April 01, 2019. FINDINGS: The right hip demonstrates a total arthroplasty without evidence of hardware failure or complication. There is no fracture or dislocation. The alignment is anatomic. Post-surgical changes and subcutaneous emphysema noted in the surrounding soft tissues. Prior left total hip arthroplasty. IMPRESSION: 1. Right total hip arthroplasty without acute postoperative complication. Electronically Signed   By: Titus Dubin M.D.   On: 07/14/2021 13:21   DG C-Arm 1-60 Min-No Report  Result Date: 07/14/2021 Fluoroscopy was utilized by the requesting physician.  No radiographic interpretation.   DG HIP OPERATIVE UNILAT W OR W/O PELVIS RIGHT  Result Date: 07/14/2021 CLINICAL DATA:  Right hip replacement. EXAM: OPERATIVE right HIP (WITH PELVIS IF PERFORMED) 3 VIEWS TECHNIQUE: Fluoroscopic spot image(s) were submitted for interpretation post-operatively. Radiation exposure index: 1.987 mGy. COMPARISON:  April 01, 2019. FINDINGS: Three intraoperative fluoroscopic images were obtained of the right hip. The right femoral  and acetabular components appear to be well situated. IMPRESSION: Fluoroscopic guidance provided during right total hip arthroplasty. Electronically Signed   By: Marijo Conception M.D.   On: 07/14/2021 12:38    Disposition: Discharge disposition: 01-Home or [place]     Mcarthur Rossetti, MD Follow up in 2 week(s).   Specialty: Orthopedic Surgery Contact information: [address] [place] [address] [phone]                  Signed: Mcarthur Rossetti 07/16/2021, 9:19 AM

## 2021-07-17 ENCOUNTER — Encounter (HOSPITAL_COMMUNITY): Payer: Self-pay | Admitting: Orthopaedic Surgery

## 2021-07-18 NOTE — Discharge Summary (Signed)
Patient ID: Ashley Shepherd MRN: [id number] DOB/AGE: [birth year] 65 y.o.  Admit date: 07/14/2021 Discharge date: 07/18/2021  Admission Diagnoses:  Principal Problem:   Avascular necrosis of right femoral head ([place]) Active Problems:   Status post total hip replacement, right   Discharge Diagnoses:  Same  Past Medical History:  Diagnosis Date   Anemia    Anxiety    Arthritis    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[place])    left   Cancer ([place])    7 years ago, cancer free now   Depression    Difficulty sleeping    GERD (gastroesophageal reflux disease)    History of kidney stones    History of transfusion    HIV disease ([place])    Kidney stones    Pneumonia     Surgeries: Procedure(s): RIGHT TOTAL HIP ARTHROPLASTY ANTERIOR APPROACH on 07/14/2021   Consultants:   Discharged Condition: Improved  Hospital Course: Ashley Shepherd is an 65 y.o. female who was admitted 07/14/2021 for operative treatment ofAvascular necrosis of right femoral head ([place]). Patient has severe unremitting pain that affects sleep, daily activities, and work/hobbies. After pre-op clearance the patient was taken to the operating room on 07/14/2021 and underwent  Procedure(s): RIGHT TOTAL HIP ARTHROPLASTY ANTERIOR APPROACH.    Patient was given perioperative antibiotics:  Anti-infectives (From admission, onward)    Start     Dose/Rate Route Frequency Ordered Stop   07/15/21 1000  emtricitabine-tenofovir AF (DESCOVY) 200-25 MG per tablet 1 tablet  Status:  Discontinued        1 tablet Oral Daily 07/14/21 1421 07/15/21 2049   07/14/21 2200  dolutegravir (TIVICAY) tablet 50 mg  Status:  Discontinued        50 mg Oral 2 times daily 07/14/21 1421 07/15/21 2049   07/14/21 2200  maraviroc (SELZENTRY) tablet 300 mg  Status:  Discontinued        300 mg Oral 2 times daily 07/14/21 1421 07/15/21 2049   07/14/21 1700  ceFAZolin (ANCEF) IVPB 1 g/50 mL premix        1 g 100 mL/hr over 30 Minutes Intravenous Every 6 hours 07/14/21 1421  07/14/21 2206   07/14/21 0900  ceFAZolin (ANCEF) 2 g in sodium chloride 0.9 % 100 mL IVPB        2 g 200 mL/hr over 30 Minutes Intravenous On call to O.R. 07/14/21 0845 07/14/21 1054        Patient was given sequential compression devices, early ambulation, and chemoprophylaxis to prevent DVT.  Patient benefited maximally from hospital stay and there were no complications.    Recent vital signs: No data found.   Recent laboratory studies: No results for input(s): WBC, HGB, HCT, PLT, NA, K, CL, CO2, BUN, CREATININE, GLUCOSE, INR, CALCIUM in the last 72 hours.  Invalid input(s): PT, 2   Discharge Medications:   Allergies as of 07/15/2021       Reactions   Iodine Itching   States that still can get the dye but it has to be mixed with something like benadryl to prevent the itching   Iodinated Diagnostic Agents Itching   Pt stated that she had NO problem/reaction to topical iodine/betadine solution.   Sulfa Antibiotics Itching   Codeine Itching        Medication List     STOP taking these medications    aspirin 81 MG tablet Replaced by: aspirin 81 MG chewable tablet       TAKE these medications  ALPRAZolam 0.25 MG tablet Commonly known as: XANAX Take 0.25 mg by mouth at bedtime as needed for anxiety or sleep.   aspirin 81 MG chewable tablet Chew 1 tablet (81 mg total) by mouth 2 (two) times daily. Replaces: aspirin 81 MG tablet   citalopram 20 MG tablet Commonly known as: CELEXA Take 20 mg by mouth daily.   cyclobenzaprine 10 MG tablet Commonly known as: FLEXERIL TAKE 2 TABLETS(20 MG) BY MOUTH THREE TIMES DAILY AS NEEDED FOR MUSCLE SPASMS. MAX DOSE IS 60 MG D- TRY TO TAKE 2 TIMES PER DAY FOR MUSCLE SPASMS   dolutegravir 50 MG tablet Commonly known as: TIVICAY Take 50 mg by mouth 2 (two) times daily.   emtricitabine-tenofovir AF 200-25 MG tablet Commonly known as: DESCOVY Take 1 tablet by mouth daily.   ferrous sulfate 325 (65 FE) MG tablet Take 325  mg by mouth daily with breakfast.   maraviroc 300 MG tablet Commonly known as: SELZENTRY Take 300 mg by mouth 2 (two) times daily.   multivitamin with minerals Tabs tablet Take 1 tablet by mouth daily.   oxyCODONE 5 MG immediate release tablet Commonly known as: Roxicodone Take 1-2 tablets (5-10 mg total) by mouth every 4 (four) hours as needed for severe pain. What changed:  how much to take when to take this   tiZANidine 4 MG tablet Commonly known as: ZANAFLEX Take 1 tablet (4 mg total) by mouth every 8 (eight) hours as needed. What changed: See the new instructions.        Diagnostic Studies: DG Pelvis Portable  Result Date: 07/14/2021 CLINICAL DATA:  Right hip replacement. EXAM: PORTABLE PELVIS 1-2 VIEWS COMPARISON:  Right hip x-rays dated April 01, 2019. FINDINGS: The right hip demonstrates a total arthroplasty without evidence of hardware failure or complication. There is no fracture or dislocation. The alignment is anatomic. Post-surgical changes and subcutaneous emphysema noted in the surrounding soft tissues. Prior left total hip arthroplasty. IMPRESSION: 1. Right total hip arthroplasty without acute postoperative complication. Electronically Signed   By: Titus Dubin M.D.   On: 07/14/2021 13:21   DG C-Arm 1-60 Min-No Report  Result Date: 07/14/2021 CLINICAL DATA:  Right hip replacement. EXAM: OPERATIVE right HIP (WITH PELVIS IF PERFORMED) 3 VIEWS TECHNIQUE: Fluoroscopic spot image(s) were submitted for interpretation post-operatively. Radiation exposure index: 1.987 mGy. COMPARISON:  April 01, 2019. FINDINGS: Three intraoperative fluoroscopic images were obtained of the right hip. The right femoral and acetabular components appear to be well situated. IMPRESSION: Fluoroscopic guidance provided during right total hip arthroplasty. Electronically Signed   By: Marijo Conception M.D.   On: 07/14/2021 12:38   DG HIP OPERATIVE UNILAT W OR W/O PELVIS RIGHT  Result Date:  07/14/2021 CLINICAL DATA:  Right hip replacement. EXAM: OPERATIVE right HIP (WITH PELVIS IF PERFORMED) 3 VIEWS TECHNIQUE: Fluoroscopic spot image(s) were submitted for interpretation post-operatively. Radiation exposure index: 1.987 mGy. COMPARISON:  April 01, 2019. FINDINGS: Three intraoperative fluoroscopic images were obtained of the right hip. The right femoral and acetabular components appear to be well situated. IMPRESSION: Fluoroscopic guidance provided during right total hip arthroplasty. Electronically Signed   By: Marijo Conception M.D.   On: 07/14/2021 12:38    Disposition: Discharge disposition: 01-Home or [place]     Mcarthur Rossetti, MD Follow up in 2 week(s).   Specialty: Orthopedic Surgery Contact information: [address] [place] [address] [phone]  Signed: Mcarthur Rossetti 07/18/2021, 3:14 PM  `

## 2021-07-25 NOTE — Telephone Encounter (Signed)
She is ok to be seen right?

## 2021-07-26 ENCOUNTER — Telehealth: Payer: Self-pay | Admitting: Physician Assistant

## 2021-07-26 NOTE — Telephone Encounter (Signed)
Left vm for pt informing her to call us back to confirm her new post op appt time since she did test positive for COVID yesterday and is now showing symptoms. The new appt time is for 08/03/21 at 2:30pm.

## 2021-07-26 NOTE — Telephone Encounter (Signed)
Pt has tested positive for covid and is now showing symptoms. Pt wants to know how she needs to get resch if she just tested positive but her staples are pulling badly. The best call back number is [phone].

## 2021-07-27 ENCOUNTER — Encounter: Payer: Self-pay | Admitting: Orthopaedic Surgery

## 2021-07-27 ENCOUNTER — Encounter: Payer: Medicare Other | Admitting: Physician Assistant

## 2021-07-28 ENCOUNTER — Other Ambulatory Visit (INDEPENDENT_AMBULATORY_CARE_PROVIDER_SITE_OTHER): Payer: Self-pay | Admitting: Orthopaedic Surgery

## 2021-07-28 NOTE — Telephone Encounter (Signed)
Are you ok with this? 

## 2021-07-29 ENCOUNTER — Other Ambulatory Visit (INDEPENDENT_AMBULATORY_CARE_PROVIDER_SITE_OTHER): Payer: Self-pay | Admitting: Orthopaedic Surgery

## 2021-07-31 ENCOUNTER — Other Ambulatory Visit: Payer: Self-pay | Admitting: Orthopaedic Surgery

## 2021-07-31 MED ORDER — OXYCODONE HCL 5 MG PO TABS: 5.0000 mg | ORAL_TABLET | Freq: Four times a day (QID) | ORAL | 0 refills | Status: DC | PRN

## 2021-08-03 ENCOUNTER — Encounter: Payer: Self-pay | Admitting: Orthopaedic Surgery

## 2021-08-03 ENCOUNTER — Ambulatory Visit (INDEPENDENT_AMBULATORY_CARE_PROVIDER_SITE_OTHER): Payer: Medicare Other | Admitting: Orthopaedic Surgery

## 2021-08-03 ENCOUNTER — Other Ambulatory Visit: Payer: Self-pay

## 2021-08-03 ENCOUNTER — Other Ambulatory Visit: Payer: Self-pay | Admitting: Orthopaedic Surgery

## 2021-08-03 DIAGNOSIS — Z96641 Presence of right artificial hip joint: Secondary | ICD-10-CM

## 2021-08-03 NOTE — Progress Notes (Signed)
The patient comes in today 2 weeks tomorrow status post a right total hip arthroplasty.  We replaced her left hip several years ago and she unfortunately had a postoperative infection with an excision arthroplasty and replantation.  This made her hesitant to have her right hip replaced.  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  This hip surgery has done very well for her thus far.  She is already walking without an assistive device and is not requesting any pain medications.  Her right hip incision looks good so the staples been removed and Steri-Strips applied.  There is no significant seroma.  Her leg lengths appear equal.  She will continue to increase her activities as comfort allows.  I would like to see her back in a month to see how she is doing overall but no x-rays are needed.  All questions and concerns were answered and addressed.

## 2021-08-10 ENCOUNTER — Telehealth: Payer: Self-pay | Admitting: Orthopaedic Surgery

## 2021-08-10 NOTE — Telephone Encounter (Signed)
Received call from [place] she advised discharging patient today because patient have met goals. The number to contact [facility] is [phone]

## 2021-08-13 ENCOUNTER — Other Ambulatory Visit: Payer: Self-pay | Admitting: Orthopaedic Surgery

## 2021-08-14 NOTE — Telephone Encounter (Signed)
ok 

## 2021-08-24 ENCOUNTER — Other Ambulatory Visit: Payer: Self-pay | Admitting: Orthopaedic Surgery

## 2021-08-24 NOTE — Telephone Encounter (Signed)
ok 

## 2021-08-25 ENCOUNTER — Telehealth: Payer: Self-pay | Admitting: Orthopaedic Surgery

## 2021-08-25 NOTE — Telephone Encounter (Signed)
Called pt left 1X vm for 1 month follow up from 8/11 with Dr. Ninfa Linden

## 2021-08-29 ENCOUNTER — Telehealth: Payer: Self-pay | Admitting: Orthopaedic Surgery

## 2021-08-29 NOTE — Telephone Encounter (Signed)
Called and left 2X vm for pt to call and set appt with Dr. Ninfa Linden 1 month follow up from 08/03/21

## 2021-08-30 ENCOUNTER — Telehealth: Payer: Self-pay | Admitting: *Deleted

## 2021-08-30 ENCOUNTER — Other Ambulatory Visit: Payer: Self-pay

## 2021-08-30 MED ORDER — OXYCODONE HCL 5 MG PO TABS: 5.0000 mg | ORAL_TABLET | Freq: Four times a day (QID) | ORAL | 0 refills | Status: DC | PRN

## 2021-08-30 NOTE — Telephone Encounter (Signed)
Left detailed message on mobile # per DPR. Informed medication refill on oxycodone sent to pharmacy and can not be filled until 09/06/21 since she received rx from Dr Ninfa Linden. She is to get narcotics from Dr Dagoberto Ligas only now.

## 2021-10-04 ENCOUNTER — Other Ambulatory Visit: Payer: Self-pay | Admitting: Orthopaedic Surgery

## 2021-10-06 MED ORDER — OXYCODONE HCL 5 MG PO TABS: 5.0000 mg | ORAL_TABLET | Freq: Four times a day (QID) | ORAL | 0 refills | Status: DC | PRN

## 2021-10-10 IMAGING — RF DG C-ARM 1-60 MIN-NO REPORT
1 series · 3 of 3 positions shown · non-contrast
Comparison: April 01, 2019.

CLINICAL DATA: Right hip replacement.

EXAM:
OPERATIVE right HIP (WITH PELVIS IF PERFORMED) 3 VIEWS
TECHNIQUE: Fluoroscopic spot image(s) were submitted for interpretation
post-operatively.
Radiation exposure index: 1.987 mGy.

[Series 1: unknown protocol · 0.20mm/px · 3 of 3 slices shown]
[im 1/3]
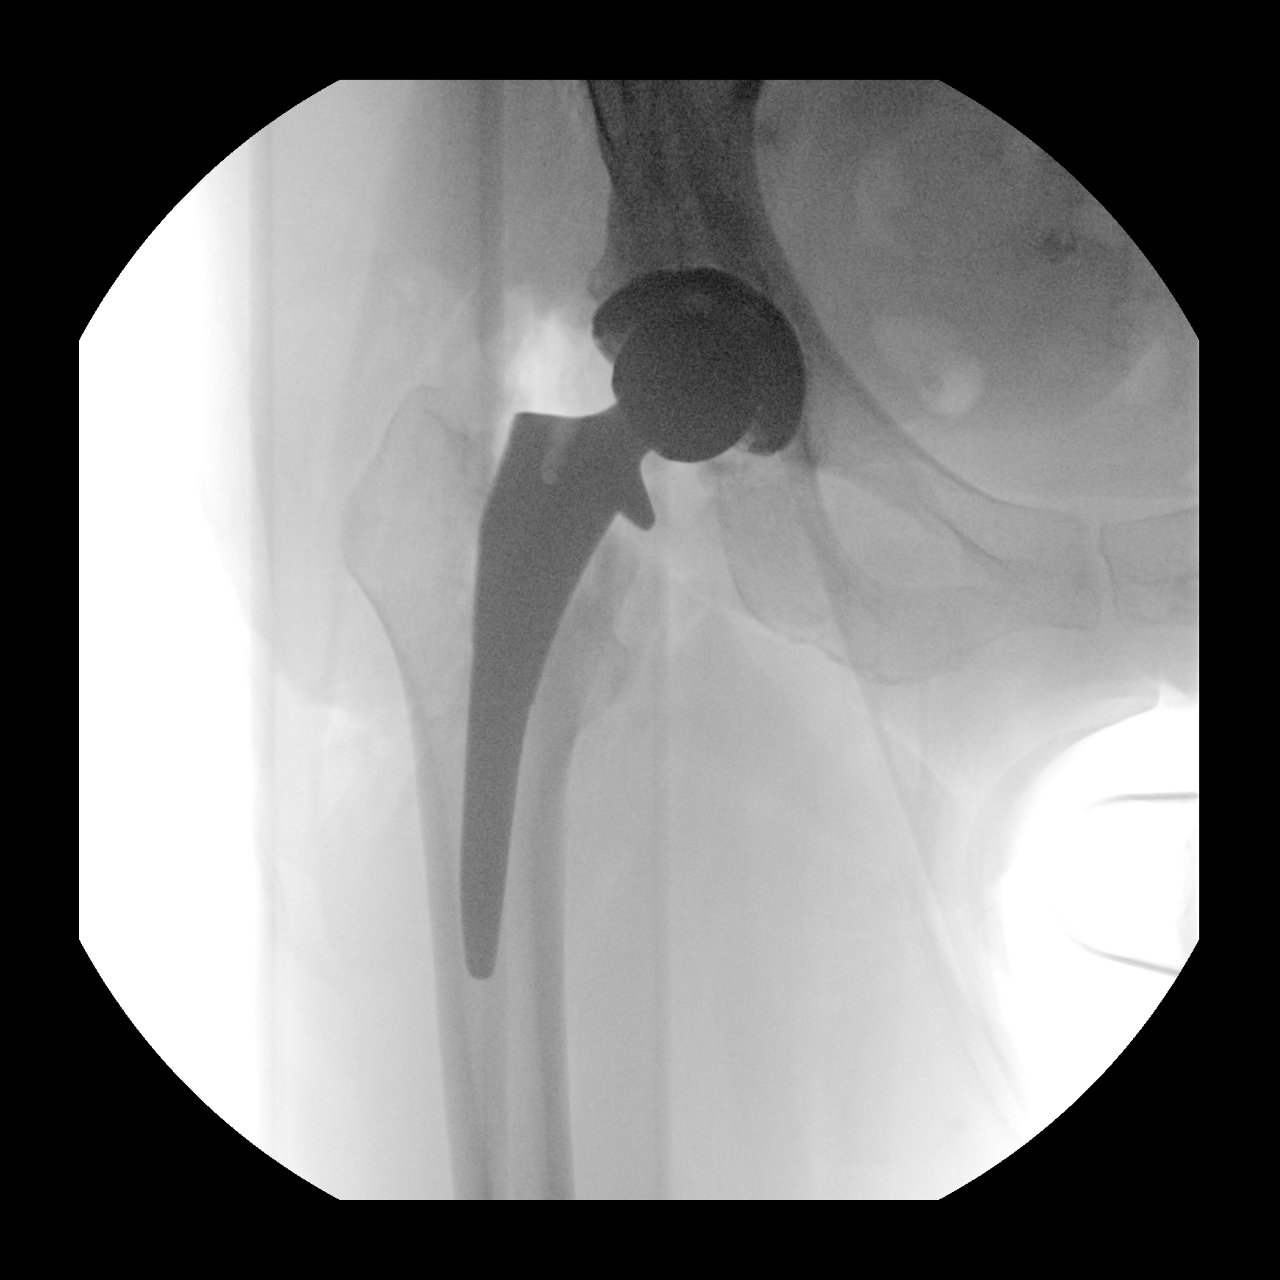
[im 2/3]
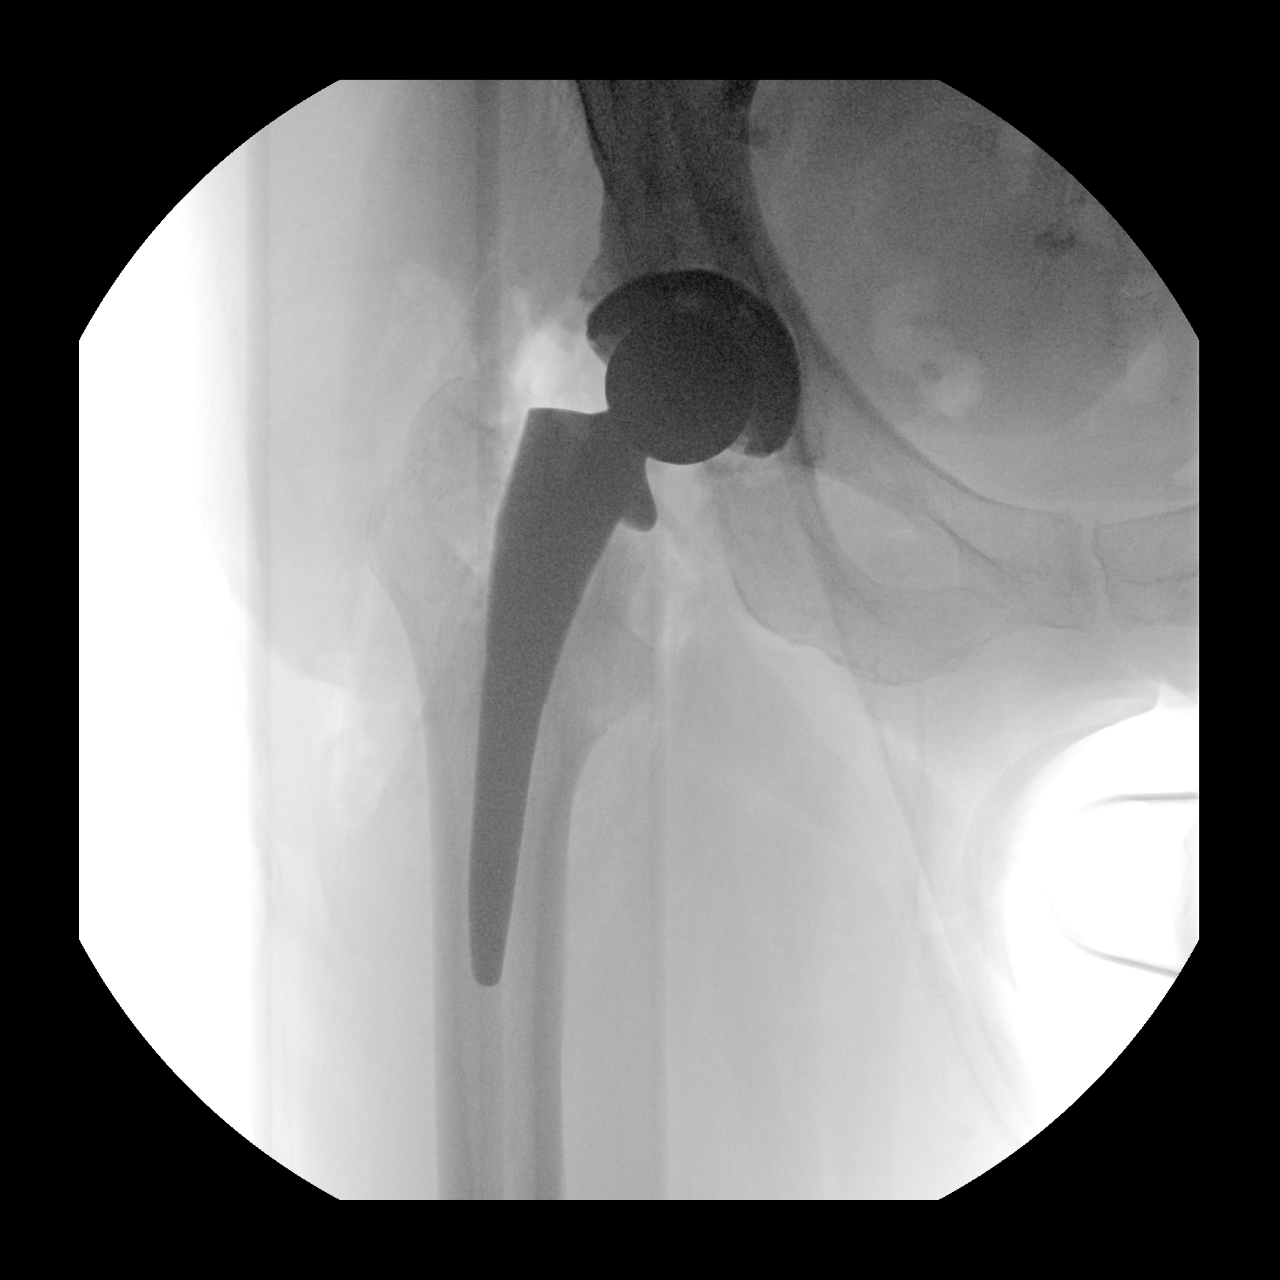
[im 3/3]
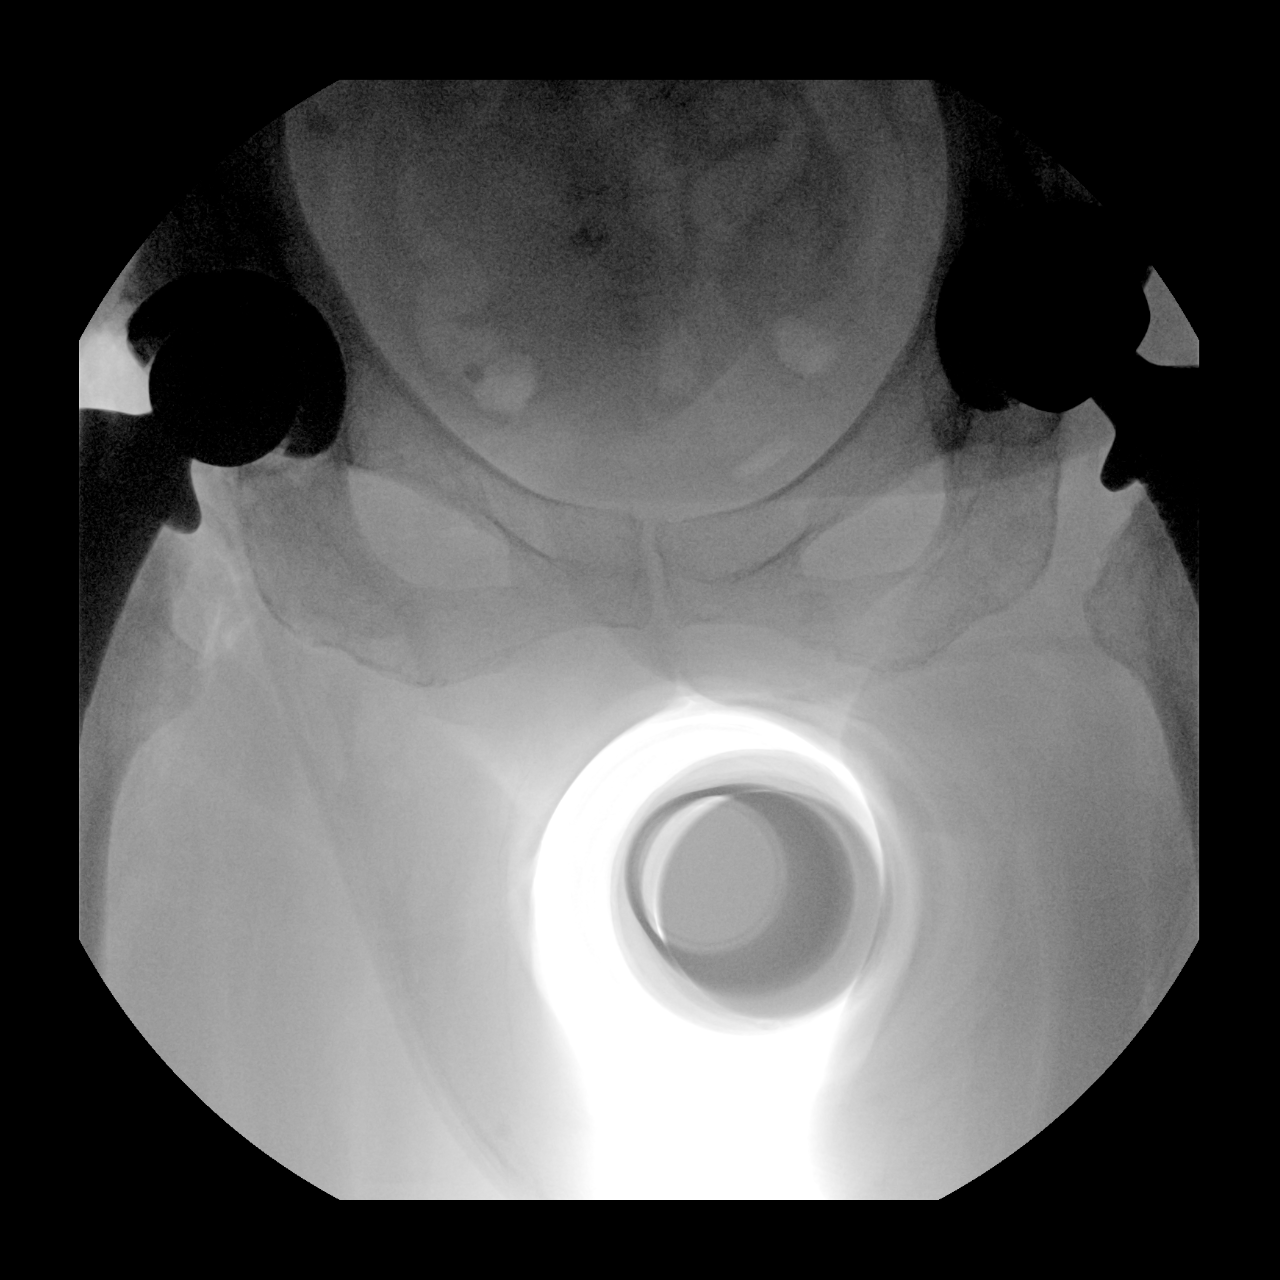

[3 of 3 positions shown; findings below may reference images not displayed]

FINDINGS: Three intraoperative fluoroscopic images were obtained of the right
hip. The right femoral and acetabular components appear to be well
situated.
IMPRESSION: Fluoroscopic guidance provided during right total hip arthroplasty.

## 2021-10-15 ENCOUNTER — Other Ambulatory Visit: Payer: Self-pay | Admitting: Orthopaedic Surgery

## 2021-10-18 ENCOUNTER — Encounter: Payer: Medicare Other | Admitting: Physical Medicine and Rehabilitation

## 2021-10-29 ENCOUNTER — Other Ambulatory Visit: Payer: Self-pay | Admitting: Orthopaedic Surgery

## 2021-11-07 ENCOUNTER — Other Ambulatory Visit: Payer: Self-pay

## 2021-11-07 MED ORDER — OXYCODONE HCL 5 MG PO TABS: 5.0000 mg | ORAL_TABLET | Freq: Four times a day (QID) | ORAL | 0 refills | Status: DC | PRN

## 2021-11-09 ENCOUNTER — Other Ambulatory Visit: Payer: Self-pay | Admitting: Orthopaedic Surgery

## 2021-11-10 ENCOUNTER — Other Ambulatory Visit: Payer: Self-pay

## 2021-11-10 ENCOUNTER — Encounter: Payer: Self-pay | Admitting: Physical Medicine and Rehabilitation

## 2021-11-10 ENCOUNTER — Encounter
Payer: Medicare Other | Attending: Physical Medicine and Rehabilitation | Admitting: Physical Medicine and Rehabilitation

## 2021-11-10 VITALS — BP 135/95 | HR 90 | Temp 97.9°F | Ht 70.0 in | Wt 185.0 lb

## 2021-11-10 DIAGNOSIS — M25551 Pain in right hip: Secondary | ICD-10-CM

## 2021-11-10 DIAGNOSIS — G8929 Other chronic pain: Secondary | ICD-10-CM

## 2021-11-10 DIAGNOSIS — Z96641 Presence of right artificial hip joint: Secondary | ICD-10-CM | POA: Diagnosis not present

## 2021-11-10 NOTE — Progress Notes (Signed)
Subjective:    Patient ID: Ashley Shepherd, female    DOB: 1956/11/16, 65 y.o.   MRN: [id number]  HPI Due to national recommendations of social distancing because of COVID 77, an audio/video tele-health visit is felt to be the most appropriate encounter for this patient at this time. See MyChart message from today for the patient's consent to a tele-health encounter with [place] [facility]. This is a follow up tele-visit via Webex. The patient is at home. MD is at office.   Patient is a 65 yr old female  HIV- undetectable for 9+ years; no other major issues except B/L hip DJD and s/p L THR with infection noted s/p surgery- Had ear infection of L ear and then had 3 surgeries of L hip. Hx of anterior hip replacements on L and R hip replacement.  Had epidural for Hip pain.    Here for f/u on chronic hip pain..   Still has arthritis pain- due to cold- Back pain- due to cold.  Worse lately. Since got cold- knees and back.   Can get to appointments- but needs early day M/W/F appointment.    Pain regimen- Takes Oxycodone 5 mg in AM and 2-3 pm, takes another Oxycodone- and alternates with Tylenol arthritis.  Occ takes 1x; and some days max 3 tabs/day.  Stopped Flexeril;  Zanaflex- 4 mg QHS- muscle spasms wake her up-  Flexeril- not at same time as Zanaflex. 10-20 mg at a time.   Pain Inventory Average Pain 6 Pain Right Now 6 My pain is constant and aching  In the last 24 hours, has pain interfered with the following? General activity 0 Relation with others 0 Enjoyment of life 0 What TIME of day is your pain at its worst? night Sleep (in general) Fair  Pain is worse with: walking and inactivity Pain improves with: heat/ice and medication Relief from Meds: 5  Family History  Problem Relation Age of Onset   Hypertension Father    Diabetes Father    Heart attack Father    Hypertension Other    Diabetes Other    Social History   Socioeconomic History    Marital status: Widowed    Spouse name: Not on file   Number of children: Not on file   Years of education: Not on file   Highest education level: Not on file  Occupational History   Not on file  Tobacco Use   Smoking status: Every Day    Packs/day: 0.50    Types: Cigarettes   Smokeless tobacco: Never  Vaping Use   Vaping Use: Never used  Substance and Sexual Activity   Alcohol use: No   Drug use: No   Sexual activity: Not on file  Other Topics Concern   Not on file  Social History Narrative   Not on file   Social Determinants of Health   Financial Resource Strain: Not on file  Food Insecurity: Not on file  Transportation Needs: Not on file  Physical Activity: Not on file  Stress: Not on file  Social Connections: Not on file   Past Surgical History:  Procedure Laterality Date   ANTERIOR HIP REVISION Left 04/06/2016   Procedure: REMOVAL OF TEMPORARY ANTIBIOTIC SPACER LEFT HIP, LEFT TOTAL HIP REVISION;  Surgeon: Mcarthur Rossetti, MD;  Location: [facility];  Service: Orthopedics;  Laterality: Left;   ANTERIOR HIP REVISION Left 08/02/2017   Procedure: Irrigation and debridement left hip with antibiotic bead placement;  Surgeon: Mcarthur Rossetti, MD;  Location: [facility];  Service: Orthopedics;  Laterality: Left;   APPENDECTOMY     COLON SURGERY  2008   COLOSTOMY REVERSAL  2013   EXCISIONAL TOTAL HIP ARTHROPLASTY WITH ANTIBIOTIC SPACERS Left 01/24/2016   Procedure: EXCISION OF LEFT TOTAL HIP ARTHROPLASTY WITH PLACEMENT OF ANTIBIOTIC SPACERS;  Surgeon: Mcarthur Rossetti, MD;  Location: [facility];  Service: Orthopedics;  Laterality: Left;   INCISION AND DRAINAGE HIP Left 01/24/2016   Procedure: IRRIGATION AND DEBRIDEMENT LEFT HIP;  Surgeon: Mcarthur Rossetti, MD;  Location: [facility];  Service: Orthopedics;  Laterality: Left;   KNEE ARTHROSCOPY     left   PORT-A-CATH REMOVAL     TOTAL HIP ARTHROPLASTY Left 10/01/2014   Procedure: LEFT TOTAL HIP ARTHROPLASTY ANTERIOR  APPROACH;  Surgeon: Mcarthur Rossetti, MD;  Location: [facility];  Service: Orthopedics;  Laterality: Left;   TOTAL HIP ARTHROPLASTY Right 07/14/2021   Procedure: RIGHT TOTAL HIP ARTHROPLASTY ANTERIOR APPROACH;  Surgeon: Mcarthur Rossetti, MD;  Location: [facility];  Service: Orthopedics;  Laterality: Right;   Past Surgical History:  Procedure Laterality Date   ANTERIOR HIP REVISION Left 04/06/2016   Procedure: REMOVAL OF TEMPORARY ANTIBIOTIC SPACER LEFT HIP, LEFT TOTAL HIP REVISION;  Surgeon: Mcarthur Rossetti, MD;  [facility]: [facility];  Service: Orthopedics;  Laterality: Left;   ANTERIOR HIP REVISION Left 08/02/2017   Procedure: Irrigation and debridement left hip with antibiotic bead placement;  Surgeon: Mcarthur Rossetti, MD;  [facility]: [facility];  Service: Orthopedics;  Laterality: Left;   APPENDECTOMY     COLON SURGERY  2008   COLOSTOMY REVERSAL  2013   EXCISIONAL TOTAL HIP ARTHROPLASTY WITH ANTIBIOTIC SPACERS Left 01/24/2016   Procedure: EXCISION OF LEFT TOTAL HIP ARTHROPLASTY WITH PLACEMENT OF ANTIBIOTIC SPACERS;  Surgeon: Mcarthur Rossetti, MD;  Location: [facility];  Service: Orthopedics;  Laterality: Left;   INCISION AND DRAINAGE HIP Left 01/24/2016   Procedure: IRRIGATION AND DEBRIDEMENT LEFT HIP;  Surgeon: Mcarthur Rossetti, MD;  Location: [facility];  Service: Orthopedics;  Laterality: Left;   KNEE ARTHROSCOPY     left   PORT-A-CATH REMOVAL     TOTAL HIP ARTHROPLASTY Left 10/01/2014   Procedure: LEFT TOTAL HIP ARTHROPLASTY ANTERIOR APPROACH;  Surgeon: Mcarthur Rossetti, MD;  [facility];  Service: Orthopedics;  Laterality: Left;   TOTAL HIP ARTHROPLASTY Right 07/14/2021   Procedure: RIGHT TOTAL HIP ARTHROPLASTY ANTERIOR APPROACH;  Surgeon: Mcarthur Rossetti, MD;  Location[facility];  Service: Orthopedics;  Laterality: Right;   Past Medical History:  Diagnosis Date   Anemia    Anxiety    Arthritis    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[facility])    left   Cancer ([place])     7 years ago, cancer free now   Depression    Difficulty sleeping    GERD (gastroesophageal reflux disease)    History of kidney stones    History of transfusion    HIV disease ([place])    Kidney stones    Pneumonia    BP (!) 172/99 Comment: per patient  Pulse 90 Comment: per patient  Temp 97.9 F (36.6 C) Comment: per patient  Ht 5\' 10"  (1.778 m)   Wt 185 lb (83.9 kg) Comment: per patient  BMI 26.54 kg/m   Opioid Risk Score:   Fall Risk Score:  `1  Depression screen [facility] 2/9  Depression screen [facility] 2/9 11/10/2021 01/04/2021 06/03/2020 01/29/2020 04/08/2018 01/21/2018 10/29/2017  Decreased Interest 0  0 0 0 0 0 0  Down, Depressed, Hopeless 0 0 0 0 0 0 0  PHQ - 2 Score 0 0 0 0 0 0 0  Altered sleeping - - - 2 - - -  Tired, decreased energy - - - 1 - - -  Change in appetite - - - 0 - - -  Feeling bad or failure about yourself  - - - 0 - - -  Trouble concentrating - - - 0 - - -  Moving slowly or fidgety/restless - - - 0 - - -  Suicidal thoughts - - - 0 - - -  PHQ-9 Score - - - 3 - - -     Review of Systems  Constitutional: Negative.   HENT: Negative.    Eyes: Negative.   Respiratory: Negative.    Cardiovascular: Negative.   Gastrointestinal: Negative.   Endocrine: Negative.   Genitourinary: Negative.   Musculoskeletal:  Positive for back pain.  Skin: Negative.   Allergic/Immunologic: Negative.   Neurological:  Positive for numbness.  Hematological: Negative.   Psychiatric/Behavioral:  Positive for sleep disturbance.       Objective:   Physical Exam   Webex- looks to have good color, NAD     Assessment & Plan:    Patient is a 65 yr old female  HIV- undetectable for 9+ years; no other major issues except B/L hip DJD and s/p L THR with infection noted s/p surgery- Had ear infection of L ear and then had 3 surgeries of L hip. Hx of anterior hip replacements on L and R hip replacement.  Had epidural for Hip pain.    Will do UDS before next appointment- will ask [facility]-  to call and schedule it.   2. Continue her pain regimen Oxycodone 5 mg 2-3x/day as neede-d last Rx for 4x/day as needed- filled 11/07/21.   3. Con't Flexeril and Zanaflex- for muscle spasms   4. F/U in 3 months. Early on M/W/F.

## 2021-11-10 NOTE — Patient Instructions (Signed)
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 Hx of anterior hip replacements on L and R hip replacement.  Had epidural for Hip pain.    Will do UDS before next appointment- will ask Sybil- to call and schedule it.   2. Continue her pain regimen Oxycodone 5 mg 2-3x/day as neede-d last Rx for 4x/day as needed- filled 11/07/21.   3. Con't Flexeril and Zanaflex- for muscle spasms   4. F/U in 3 months. Early on M/W/F.

## 2021-11-19 ENCOUNTER — Other Ambulatory Visit: Payer: Self-pay | Admitting: Orthopaedic Surgery

## 2021-11-29 ENCOUNTER — Other Ambulatory Visit: Payer: Self-pay | Admitting: Orthopaedic Surgery

## 2021-12-04 ENCOUNTER — Ambulatory Visit: Payer: Medicare Other | Admitting: Physical Medicine and Rehabilitation

## 2021-12-05 ENCOUNTER — Other Ambulatory Visit: Payer: Self-pay | Admitting: Physical Medicine and Rehabilitation

## 2021-12-06 MED ORDER — OXYCODONE HCL 5 MG PO TABS: 5.0000 mg | ORAL_TABLET | Freq: Four times a day (QID) | ORAL | 0 refills | Status: DC | PRN

## 2021-12-09 ENCOUNTER — Other Ambulatory Visit: Payer: Self-pay | Admitting: Orthopaedic Surgery

## 2021-12-18 ENCOUNTER — Other Ambulatory Visit: Payer: Self-pay | Admitting: Physician Assistant

## 2021-12-31 ENCOUNTER — Other Ambulatory Visit: Payer: Self-pay | Admitting: Orthopaedic Surgery

## 2022-01-05 ENCOUNTER — Other Ambulatory Visit: Payer: Self-pay | Admitting: Physical Medicine and Rehabilitation

## 2022-01-05 MED ORDER — OXYCODONE HCL 5 MG PO TABS: 5.0000 mg | ORAL_TABLET | Freq: Four times a day (QID) | ORAL | 0 refills | Status: DC | PRN

## 2022-01-05 NOTE — Telephone Encounter (Signed)
PMP was Reviewed: UDS Reviewed.  Dr Dagoberto Ligas Note was Reviewed. Oxycodone e-scribed today.

## 2022-01-05 NOTE — Telephone Encounter (Signed)
Pharmacy sent a refill for Oxycodone. Per PMP, last fill was 12/06/21

## 2022-01-11 ENCOUNTER — Other Ambulatory Visit: Payer: Self-pay | Admitting: Orthopaedic Surgery

## 2022-01-21 ENCOUNTER — Other Ambulatory Visit: Payer: Self-pay | Admitting: Orthopaedic Surgery

## 2022-02-01 ENCOUNTER — Other Ambulatory Visit: Payer: Self-pay | Admitting: Orthopaedic Surgery

## 2022-02-06 ENCOUNTER — Other Ambulatory Visit: Payer: Self-pay | Admitting: Physical Medicine and Rehabilitation

## 2022-02-06 MED ORDER — OXYCODONE HCL 5 MG PO TABS: 5.0000 mg | ORAL_TABLET | Freq: Four times a day (QID) | ORAL | 0 refills | Status: DC | PRN

## 2022-02-07 ENCOUNTER — Other Ambulatory Visit: Payer: Self-pay | Admitting: Physical Medicine and Rehabilitation

## 2022-02-07 MED ORDER — OXYCODONE HCL 5 MG PO TABS: 5.0000 mg | ORAL_TABLET | Freq: Four times a day (QID) | ORAL | 0 refills | Status: DC | PRN

## 2022-02-11 ENCOUNTER — Other Ambulatory Visit: Payer: Self-pay | Admitting: Orthopaedic Surgery

## 2022-02-19 ENCOUNTER — Encounter
Payer: Medicare Other | Attending: Physical Medicine and Rehabilitation | Admitting: Physical Medicine and Rehabilitation

## 2022-02-19 ENCOUNTER — Other Ambulatory Visit: Payer: Self-pay

## 2022-02-19 ENCOUNTER — Encounter: Payer: Self-pay | Admitting: Physical Medicine and Rehabilitation

## 2022-02-19 VITALS — BP 160/82 | HR 77 | Ht 70.0 in | Wt 191.0 lb

## 2022-02-19 DIAGNOSIS — G8929 Other chronic pain: Secondary | ICD-10-CM | POA: Diagnosis present

## 2022-02-19 DIAGNOSIS — Z79891 Long term (current) use of opiate analgesic: Secondary | ICD-10-CM | POA: Diagnosis present

## 2022-02-19 DIAGNOSIS — Z96641 Presence of right artificial hip joint: Secondary | ICD-10-CM | POA: Diagnosis present

## 2022-02-19 DIAGNOSIS — M792 Neuralgia and neuritis, unspecified: Secondary | ICD-10-CM | POA: Diagnosis present

## 2022-02-19 DIAGNOSIS — M25551 Pain in right hip: Secondary | ICD-10-CM | POA: Insufficient documentation

## 2022-02-19 DIAGNOSIS — Z5181 Encounter for therapeutic drug level monitoring: Secondary | ICD-10-CM | POA: Diagnosis present

## 2022-02-19 DIAGNOSIS — G894 Chronic pain syndrome: Secondary | ICD-10-CM | POA: Insufficient documentation

## 2022-02-19 MED ORDER — GABAPENTIN 300 MG PO CAPS: 300.0000 mg | ORAL_CAPSULE | Freq: Every day | ORAL | 5 refills | Status: DC

## 2022-02-19 NOTE — Patient Instructions (Signed)
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 Hx of anterior hip replacements on L and R hip replacement.  Had epidural for Hip pain.   Nerve pain- Gabapentin-300 mg nightly x 4 days, then 300 mg 3x/day ( or 1 tab in AM and 2 tabs at night) x 1 week, then 2 tabs/600 mg 2x/day- for nerve pain.   2. Suggest speaking to PCP about Requip? For restless leg syndrome symptoms.    3. Will reduce Oxycodone down to 1 tab evry 8 hours as needed so #90 pills- at next refill.  Last Rx- 02/07/22.  4.  Theracane- hold pressure for at least 2 minutes- the bigger the muscle, the longer the time- not massaging- DON'T LET GO IN FIRST 1 minute- since muscle is actually tighter than initially- give it the 2 minutes.  Youtube videos on how to use.    5. F/U in 3 months-if gabapentin doesn't work, call me and will try something different.

## 2022-02-19 NOTE — Progress Notes (Signed)
Subjective:    Patient ID: Ashley Shepherd, female    DOB: 10/17/1956, 66 y.o.   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 Hx of anterior hip replacements on L and R hip replacement.  Had epidural for Hip pain.    Here for f/u on chronic pain.   L hip from prior doesn't hurt, still having some pain in R hip- ~ 5/10-  Has been ~ 4 months since R THR  Has tingling and burning in feet- wakes her up with pain.    Some days have to take 2 tabs- when cold and raining.  Takes 2 tabs 2-3x/week- Takes pain meds 2x/day usually.   Also has pain in back and neck and knees due to arthritis- but also points to muscles of upper traps for muscle tightness.    Also has restless leg syndrome Sx's- makes her get up in the middle of night, it's so irritating.      Pain Inventory Average Pain 6 Pain Right Now 5 My pain is sharp, dull, and tingling  In the last 24 hours, has pain interfered with the following? General activity 5 Relation with others 7 Enjoyment of life 6 What TIME of day is your pain at its worst? morning  and night Sleep (in general) Fair  Pain is worse with: walking, bending, inactivity, and some activites Pain improves with: medication Relief from Meds: 5  Family History  Problem Relation Age of Onset   Hypertension Father    Diabetes Father    Heart attack Father    Hypertension Other    Diabetes Other    Social History   Socioeconomic History   Marital status: Widowed    Spouse name: Not on file   Number of children: Not on file   Years of education: Not on file   Highest education level: Not on file  Occupational History   Not on file  Tobacco Use   Smoking status: Every Day    Packs/day: 0.50    Types: Cigarettes   Smokeless tobacco: Never  Vaping Use    Vaping Use: Never used  Substance and Sexual Activity   Alcohol use: No   Drug use: No   Sexual activity: Not on file  Other Topics Concern   Not on file  Social History Narrative   Not on file   Social Determinants of Health   Financial Resource Strain: Not on file  Food Insecurity: Not on file  Transportation Needs: Not on file  Physical Activity: Not on file  Stress: Not on file  Social Connections: Not on file   Past Surgical History:  Procedure Laterality Date   ANTERIOR HIP REVISION Left 04/06/2016   Procedure: REMOVAL OF TEMPORARY ANTIBIOTIC SPACER LEFT HIP, LEFT TOTAL HIP REVISION;  Surgeon: Mcarthur Rossetti, MD;  Location: [facility];  Service: Orthopedics;  Laterality: Left;   ANTERIOR HIP REVISION Left 08/02/2017   Procedure: Irrigation and debridement left hip with antibiotic bead placement;  Surgeon: Mcarthur Rossetti, MD;  Location: [facility];  Service: Orthopedics;  Laterality: Left;   APPENDECTOMY     COLON SURGERY  2008   COLOSTOMY REVERSAL  2013   EXCISIONAL TOTAL HIP ARTHROPLASTY WITH ANTIBIOTIC SPACERS Left 01/24/2016  Procedure: EXCISION OF LEFT TOTAL HIP ARTHROPLASTY WITH PLACEMENT OF ANTIBIOTIC SPACERS;  Surgeon: Mcarthur Rossetti, MD;  Location: [place];  Service: Orthopedics;  Laterality: Left;   INCISION AND DRAINAGE HIP Left 01/24/2016   Procedure: IRRIGATION AND DEBRIDEMENT LEFT HIP;  Surgeon: Mcarthur Rossetti, MD;  Location: [place];  Service: Orthopedics;  Laterality: Left;   KNEE ARTHROSCOPY     left   PORT-A-CATH REMOVAL     TOTAL HIP ARTHROPLASTY Left 10/01/2014   Procedure: LEFT TOTAL HIP ARTHROPLASTY ANTERIOR APPROACH;  Surgeon: Mcarthur Rossetti, MD;  Location[facility];  Service: Orthopedics;  Laterality: Left;   TOTAL HIP ARTHROPLASTY Right 07/14/2021   Procedure: RIGHT TOTAL HIP ARTHROPLASTY ANTERIOR APPROACH;  Surgeon: Mcarthur Rossetti, MD;  Location: [facility];  Service: Orthopedics;  Laterality: Right;   Past Surgical  History:  Procedure Laterality Date   ANTERIOR HIP REVISION Left 04/06/2016   Procedure: REMOVAL OF TEMPORARY ANTIBIOTIC SPACER LEFT HIP, LEFT TOTAL HIP REVISION;  Surgeon: Mcarthur Rossetti, MD;  [facility];  Service: Orthopedics;  Laterality: Left;   ANTERIOR HIP REVISION Left 08/02/2017   Procedure: Irrigation and debridement left hip with antibiotic bead placement;  Surgeon: Mcarthur Rossetti, MD;  [facility];  Service: Orthopedics;  Laterality: Left;   APPENDECTOMY     COLON SURGERY  2008   COLOSTOMY REVERSAL  2013   EXCISIONAL TOTAL HIP ARTHROPLASTY WITH ANTIBIOTIC SPACERS Left 01/24/2016   Procedure: EXCISION OF LEFT TOTAL HIP ARTHROPLASTY WITH PLACEMENT OF ANTIBIOTIC SPACERS;  Surgeon: Mcarthur Rossetti, MD;  Location: [place];  Service: Orthopedics;  Laterality: Left;   INCISION AND DRAINAGE HIP Left 01/24/2016   Procedure: IRRIGATION AND DEBRIDEMENT LEFT HIP;  Surgeon: Mcarthur Rossetti, MD;  Location: [place];  Service: Orthopedics;  Laterality: Left;   KNEE ARTHROSCOPY     left   PORT-A-CATH REMOVAL     TOTAL HIP ARTHROPLASTY Left 10/01/2014   Procedure: LEFT TOTAL HIP ARTHROPLASTY ANTERIOR APPROACH;  Surgeon: Mcarthur Rossetti, MD;  [facility];  Service: Orthopedics;  Laterality: Left;   TOTAL HIP ARTHROPLASTY Right 07/14/2021   Procedure: RIGHT TOTAL HIP ARTHROPLASTY ANTERIOR APPROACH;  Surgeon: Mcarthur Rossetti, MD;  [facility];  Service: Orthopedics;  Laterality: Right;   Past Medical History:  Diagnosis Date   Anemia    Anxiety    Arthritis    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[place])    left   Cancer ([place])    7 years ago, cancer free now   Depression    Difficulty sleeping    GERD (gastroesophageal reflux disease)    History of kidney stones    History of transfusion    HIV disease ([facility])    Kidney stones    Pneumonia    BP (!) 160/82    Pulse 77    Ht 5\' 10"  (1.778 m)    Wt 191 lb (86.6 kg)    SpO2 99%    BMI 27.41 kg/m    Opioid Risk Score:   Fall Risk Score:  `1  Depression screen PHQ 2/9  Depression screen [facility] 2/9 11/10/2021 01/04/2021 06/03/2020 01/29/2020 04/08/2018 01/21/2018 10/29/2017  Decreased Interest 0 0 0 0 0 0 0  Down, Depressed, Hopeless 0 0 0 0 0 0 0  PHQ - 2 Score 0 0 0 0 0 0 0  Altered sleeping - - - 2 - - -  Tired, decreased energy - - - 1 - - -  Change in appetite - - - 0 - - -  Feeling bad or failure about yourself  - - - 0 - - -  Trouble concentrating - - - 0 - - -  Moving slowly or fidgety/restless - - - 0 - - -  Suicidal thoughts - - - 0 - - -  PHQ-9 Score - - - 3 - - -     Review of Systems  Musculoskeletal:  Positive for neck pain.       Bilateral knee, wrist and feet pain  All other systems reviewed and are negative.     Objective:   Physical Exam  Awake, alert, appropriate, tight/trigger points in R>L upper traps; and rhomboids and levators and scalenes, B/L  Siting up on table- no assistive device, NAD       Assessment & Plan:   Patient is a 66 yr old female  HIV- undetectable for 9+ years; no other major issues except B/L hip DJD and s/p L THR with infection noted s/p surgery- Had ear infection of L ear and then had 3 surgeries of L hip. Hx of anterior hip replacements on L and R hip replacement.  Had epidural for Hip pain. Also has nerve pain and myofascial pain.   Nerve pain- Gabapentin-300 mg nightly x 4 days, then 300 mg 3x/day ( or 1 tab in AM and 2 tabs at night) x 1 week, then 2 tabs/600 mg 2x/day- for nerve pain.   2. Suggest speaking to PCP about Requip? For restless leg syndrome symptoms.    3. Will reduce Oxycodone down to 1 tab evry 8 hours as needed so #90 pills- at next refill.  Last Rx- 02/07/22.  4.  Theracane- hold pressure for at least 2 minutes- the bigger the muscle, the longer the time- not massaging- DON'T LET GO IN FIRST 1 minute- since muscle is actually tighter than initially- give it the 2 minutes.  Youtube videos on how to use.     5. F/U in 3 months-if gabapentin doesn't work, call me and will try something different.    I spent a total of 21   minutes on total care today- >50% coordination of care- due to education on theracane and gabapentin discussion-

## 2022-02-22 ENCOUNTER — Other Ambulatory Visit: Payer: Self-pay | Admitting: Orthopaedic Surgery

## 2022-02-26 LAB — DRUG TOX MONITOR 1 W/CONF, ORAL FLD
Alprazolam: 0.95 ng/mL — ABNORMAL HIGH (ref <0.50)
Amphetamines: NEGATIVE ng/mL (ref <10)
Barbiturates: NEGATIVE ng/mL (ref <10)
Benzodiazepines: POSITIVE ng/mL — AB (ref <0.50)
Buprenorphine: NEGATIVE ng/mL (ref <0.10)
Chlordiazepoxide: NEGATIVE ng/mL (ref <0.50)
Clonazepam: NEGATIVE ng/mL (ref <0.50)
Cocaine: NEGATIVE ng/mL (ref <5.0)
Codeine: NEGATIVE ng/mL (ref <2.5)
Cotinine: >250 ng/mL — ABNORMAL HIGH (ref <5.0)
Diazepam: NEGATIVE ng/mL (ref <0.50)
Dihydrocodeine: NEGATIVE ng/mL (ref <2.5)
Fentanyl: NEGATIVE ng/mL (ref <0.10)
Flunitrazepam: NEGATIVE ng/mL (ref <0.50)
Flurazepam: NEGATIVE ng/mL (ref <0.50)
Heroin Metabolite: NEGATIVE ng/mL (ref <1.0)
Hydrocodone: NEGATIVE ng/mL (ref <2.5)
Hydromorphone: NEGATIVE ng/mL (ref <2.5)
Lorazepam: NEGATIVE ng/mL (ref <0.50)
MARIJUANA: NEGATIVE ng/mL (ref <2.5)
MDMA: NEGATIVE ng/mL (ref <10)
Meprobamate: NEGATIVE ng/mL (ref <2.5)
Methadone: NEGATIVE ng/mL (ref <5.0)
Midazolam: NEGATIVE ng/mL (ref <0.50)
Morphine: NEGATIVE ng/mL (ref <2.5)
Nicotine Metabolite: POSITIVE ng/mL — AB (ref <5.0)
Nordiazepam: NEGATIVE ng/mL (ref <0.50)
Norhydrocodone: NEGATIVE ng/mL (ref <2.5)
Noroxycodone: 5 ng/mL — ABNORMAL HIGH (ref <2.5)
Opiates: POSITIVE ng/mL — AB (ref <2.5)
Oxycodone: 7.7 ng/mL — ABNORMAL HIGH (ref <2.5)
Oxymorphone: NEGATIVE ng/mL (ref <2.5)
Phencyclidine: NEGATIVE ng/mL (ref <10)
Tapentadol: NEGATIVE ng/mL (ref <5.0)
Temazepam: NEGATIVE ng/mL (ref <0.50)
Tramadol: NEGATIVE ng/mL (ref <5.0)
Triazolam: NEGATIVE ng/mL (ref <0.50)
Zolpidem: NEGATIVE ng/mL (ref <5.0)

## 2022-02-26 LAB — DRUG TOX ALC METAB W/CON, ORAL FLD: Alcohol Metabolite: NEGATIVE ng/mL (ref <25)

## 2022-02-28 ENCOUNTER — Telehealth: Payer: Self-pay | Admitting: *Deleted

## 2022-02-28 NOTE — Telephone Encounter (Signed)
Oral swab drug screen was consistent for prescribed medications.  ?

## 2022-03-04 ENCOUNTER — Other Ambulatory Visit: Payer: Self-pay | Admitting: Orthopaedic Surgery

## 2022-03-06 ENCOUNTER — Other Ambulatory Visit: Payer: Self-pay | Admitting: Physical Medicine and Rehabilitation

## 2022-03-06 MED ORDER — OXYCODONE HCL 5 MG PO TABS: 5.0000 mg | ORAL_TABLET | Freq: Four times a day (QID) | ORAL | 0 refills | Status: DC | PRN

## 2022-03-15 ENCOUNTER — Other Ambulatory Visit: Payer: Self-pay | Admitting: Physician Assistant

## 2022-03-15 ENCOUNTER — Encounter: Payer: Self-pay | Admitting: Physical Medicine and Rehabilitation

## 2022-03-16 MED ORDER — TIZANIDINE HCL 4 MG PO TABS: ORAL_TABLET | ORAL | 5 refills | Status: DC

## 2022-04-06 ENCOUNTER — Other Ambulatory Visit: Payer: Self-pay | Admitting: Physical Medicine and Rehabilitation

## 2022-04-06 ENCOUNTER — Encounter: Payer: Self-pay | Admitting: Physical Medicine and Rehabilitation

## 2022-04-06 ENCOUNTER — Telehealth: Payer: Self-pay

## 2022-04-06 MED ORDER — OXYCODONE HCL 5 MG PO TABS: 5.0000 mg | ORAL_TABLET | Freq: Four times a day (QID) | ORAL | 0 refills | Status: DC | PRN

## 2022-04-06 NOTE — Telephone Encounter (Signed)
Patient sent a MyChart message and stated the pharmacy did not have her Oxycodone. It was set on Print and not Normal so the pharmacy did not receive it  ?

## 2022-04-09 ENCOUNTER — Telehealth: Payer: Self-pay | Admitting: *Deleted

## 2022-04-09 MED ORDER — OXYCODONE HCL 5 MG PO TABS: 5.0000 mg | ORAL_TABLET | Freq: Three times a day (TID) | ORAL | 0 refills | Status: DC | PRN

## 2022-04-09 NOTE — Telephone Encounter (Signed)
The pharmacy does not have her oxycodone Rx. It was on print when sent. Can you resend please? ?

## 2022-04-09 NOTE — Telephone Encounter (Signed)
Dr Dagoberto Ligas Note was reviewed: She wanted her oxycodone decrease to one tablet every 8 hours.  ?PMP was Reviewed.  ?Oxycodone e-scribed today.  ?Placed a call to Ms. Schlack, no answer. Left message to return the call.  ?

## 2022-05-09 ENCOUNTER — Telehealth: Payer: Self-pay | Admitting: *Deleted

## 2022-05-09 NOTE — Telephone Encounter (Signed)
Ashley Shepherd is asking for a refill on her oxycodone.  Last fill date was 04/09/22 per PMP and next appt is 05/28/22. ?

## 2022-05-10 ENCOUNTER — Other Ambulatory Visit: Payer: Self-pay | Admitting: Physical Medicine and Rehabilitation

## 2022-05-10 ENCOUNTER — Encounter: Payer: Self-pay | Admitting: Physical Medicine and Rehabilitation

## 2022-05-10 MED ORDER — OXYCODONE HCL 5 MG PO TABS: 5.0000 mg | ORAL_TABLET | Freq: Three times a day (TID) | ORAL | 0 refills | Status: DC | PRN

## 2022-05-10 NOTE — Telephone Encounter (Signed)
PMP was Reviewed Dr Dagoberto Ligas Note was reviewed.  Oxycodone e-scribed today. Ms. Mccombs is aware via My-Chart message.

## 2022-05-28 ENCOUNTER — Encounter
Payer: Medicare Other | Attending: Physical Medicine and Rehabilitation | Admitting: Physical Medicine and Rehabilitation

## 2022-06-22 ENCOUNTER — Encounter: Payer: Medicare Other | Admitting: Physical Medicine and Rehabilitation

## 2022-07-05 ENCOUNTER — Encounter: Payer: Self-pay | Admitting: Physical Medicine and Rehabilitation

## 2022-07-06 ENCOUNTER — Encounter
Payer: Medicare Other | Attending: Physical Medicine and Rehabilitation | Admitting: Physical Medicine and Rehabilitation

## 2022-07-06 ENCOUNTER — Encounter: Payer: Self-pay | Admitting: Physical Medicine and Rehabilitation

## 2022-07-06 ENCOUNTER — Other Ambulatory Visit: Payer: Self-pay | Admitting: Physical Medicine and Rehabilitation

## 2022-07-06 VITALS — BP 120/82 | HR 83 | Ht 70.0 in | Wt 191.0 lb

## 2022-07-06 DIAGNOSIS — G8929 Other chronic pain: Secondary | ICD-10-CM | POA: Diagnosis not present

## 2022-07-06 DIAGNOSIS — M792 Neuralgia and neuritis, unspecified: Secondary | ICD-10-CM | POA: Diagnosis not present

## 2022-07-06 DIAGNOSIS — M25551 Pain in right hip: Secondary | ICD-10-CM | POA: Diagnosis not present

## 2022-07-06 DIAGNOSIS — G894 Chronic pain syndrome: Secondary | ICD-10-CM

## 2022-07-06 MED ORDER — OXYCODONE HCL 5 MG PO TABS: 5.0000 mg | ORAL_TABLET | Freq: Three times a day (TID) | ORAL | 0 refills | Status: DC | PRN

## 2022-07-06 NOTE — Progress Notes (Signed)
Subjective:    Patient ID: Ashley Shepherd, female    DOB: 25-Jul-1956, 66 y.o.   MRN: [id number]  HPI Patient is a 66 yr old female  HIV- undetectable for 9+ years; no other major issues except B/L hip DJD and s/p L THR with infection noted s/p surgery- Had ear infection of L ear and then had 3 surgeries of L hip. Hx of anterior hip replacements on L and R hip replacement.  Had epidural for Hip pain. Also has nerve pain and myofascial pain.  Having pain in back- not hip.  Not the R hip, but the back- upper back- for past 1 week- from coughing.   Just sent in refill for pain med-s works unless it's raining.       Pain Inventory Average Pain 10 Pain Right Now 8 My pain is constant and cannot described  In the last 24 hours, has pain interfered with the following? General activity 5 Relation with others 0 Enjoyment of life 0 What TIME of day is your pain at its worst? morning , daytime, evening, and night Sleep (in general) Fair  Pain is worse with: some activites Pain improves with: heat/ice and medication Relief from Meds: 6  Family History  Problem Relation Age of Onset   Hypertension Father    Diabetes Father    Heart attack Father    Hypertension Other    Diabetes Other    Social History   Socioeconomic History   Marital status: Widowed    Spouse name: Not on file   Number of children: Not on file   Years of education: Not on file   Highest education level: Not on file  Occupational History   Not on file  Tobacco Use   Smoking status: Every Day    Packs/day: 0.50    Types: Cigarettes   Smokeless tobacco: Never  Vaping Use   Vaping Use: Never used  Substance and Sexual Activity   Alcohol use: No   Drug use: No   Sexual activity: Not on file  Other Topics Concern   Not on file  Social History Narrative   Not on file   Social Determinants of Health   Financial Resource Strain: Not on file  Food Insecurity: Not on file  Transportation Needs: Not on  file  Physical Activity: Not on file  Stress: Not on file  Social Connections: Not on file   Past Surgical History:  Procedure Laterality Date   ANTERIOR HIP REVISION Left 04/06/2016   Procedure: REMOVAL OF TEMPORARY ANTIBIOTIC SPACER LEFT HIP, LEFT TOTAL HIP REVISION;  Surgeon: Mcarthur Rossetti, MD;  [facility];  Service: Orthopedics;  Laterality: Left;   ANTERIOR HIP REVISION Left 08/02/2017   Procedure: Irrigation and debridement left hip with antibiotic bead placement;  Surgeon: Mcarthur Rossetti, MD;  [facility];  Service: Orthopedics;  Laterality: Left;   APPENDECTOMY     COLON SURGERY  2008   COLOSTOMY REVERSAL  2013   EXCISIONAL TOTAL HIP ARTHROPLASTY WITH ANTIBIOTIC SPACERS Left 01/24/2016   Procedure: EXCISION OF LEFT TOTAL HIP ARTHROPLASTY WITH PLACEMENT OF ANTIBIOTIC SPACERS;  Surgeon: Mcarthur Rossetti, MD;  Location: [place];  Service: Orthopedics;  Laterality: Left;   INCISION AND DRAINAGE HIP Left 01/24/2016   Procedure: IRRIGATION AND DEBRIDEMENT LEFT HIP;  Surgeon: Mcarthur Rossetti, MD;  Location: [place];  Service: Orthopedics;  Laterality: Left;   KNEE ARTHROSCOPY     left   PORT-A-CATH REMOVAL  TOTAL HIP ARTHROPLASTY Left 10/01/2014   Procedure: LEFT TOTAL HIP ARTHROPLASTY ANTERIOR APPROACH;  Surgeon: Mcarthur Rossetti, MD;  [facility];  Service: Orthopedics;  Laterality: Left;   TOTAL HIP ARTHROPLASTY Right 07/14/2021   Procedure: RIGHT TOTAL HIP ARTHROPLASTY ANTERIOR APPROACH;  Surgeon: Mcarthur Rossetti, MD;  [facility];  Service: Orthopedics;  Laterality: Right;   Past Surgical History:  Procedure Laterality Date   ANTERIOR HIP REVISION Left 04/06/2016   Procedure: REMOVAL OF TEMPORARY ANTIBIOTIC SPACER LEFT HIP, LEFT TOTAL HIP REVISION;  Surgeon: Mcarthur Rossetti, MD;  [facility];  Service: Orthopedics;  Laterality: Left;   ANTERIOR HIP REVISION Left 08/02/2017   Procedure: Irrigation and debridement  left hip with antibiotic bead placement;  Surgeon: Mcarthur Rossetti, MD;  [facility];  Service: Orthopedics;  Laterality: Left;   APPENDECTOMY     COLON SURGERY  2008   COLOSTOMY REVERSAL  2013   EXCISIONAL TOTAL HIP ARTHROPLASTY WITH ANTIBIOTIC SPACERS Left 01/24/2016   Procedure: EXCISION OF LEFT TOTAL HIP ARTHROPLASTY WITH PLACEMENT OF ANTIBIOTIC SPACERS;  Surgeon: Mcarthur Rossetti, MD;  Location: [place];  Service: Orthopedics;  Laterality: Left;   INCISION AND DRAINAGE HIP Left 01/24/2016   Procedure: IRRIGATION AND DEBRIDEMENT LEFT HIP;  Surgeon: Mcarthur Rossetti, MD;  Location: [place];  Service: Orthopedics;  Laterality: Left;   KNEE ARTHROSCOPY     left   PORT-A-CATH REMOVAL     TOTAL HIP ARTHROPLASTY Left 10/01/2014   Procedure: LEFT TOTAL HIP ARTHROPLASTY ANTERIOR APPROACH;  Surgeon: Mcarthur Rossetti, MD;  [facility];  Service: Orthopedics;  Laterality: Left;   TOTAL HIP ARTHROPLASTY Right 07/14/2021   Procedure: RIGHT TOTAL HIP ARTHROPLASTY ANTERIOR APPROACH;  Surgeon: Mcarthur Rossetti, MD;  [facility];  Service: Orthopedics;  Laterality: Right;   Past Medical History:  Diagnosis Date   Anemia    Anxiety    Arthritis    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[place])    left   Cancer ([place])    7 years ago, cancer free now   Depression    Difficulty sleeping    GERD (gastroesophageal reflux disease)    History of kidney stones    History of transfusion    HIV disease ([place])    Kidney stones    Pneumonia    BP 120/82 Comment: per patient at home  Pulse 83 Comment: per patient at home  Ht '5\' 10"'$  (1.778 m)   Wt 191 lb (86.6 kg) Comment: per patient  BMI 27.41 kg/m   Opioid Risk Score:   Fall Risk Score:  `1  Depression screen [facility] 2/9     07/06/2022    3:32 PM 11/10/2021    2:21 PM 01/04/2021    9:22 AM 06/03/2020    9:55 AM 01/29/2020   10:38 AM 04/08/2018    8:55 AM 01/21/2018    9:55 AM  Depression screen PHQ 2/9  Decreased Interest 0  0 0 0 0 0 0  Down, Depressed, Hopeless 0 0 0 0 0 0 0  PHQ - 2 Score 0 0 0 0 0 0 0  Altered sleeping     2    Tired, decreased energy     1    Change in appetite     0    Feeling bad or failure about yourself      0    Trouble concentrating     0    Moving slowly or  fidgety/restless     0    Suicidal thoughts     0    PHQ-9 Score     3       Review of Systems  Constitutional: Negative.   HENT: Negative.    Eyes: Negative.   Respiratory:  Positive for cough.   Cardiovascular: Negative.   Gastrointestinal: Negative.   Endocrine: Negative.   Genitourinary: Negative.   Musculoskeletal:  Positive for back pain.  Skin: Negative.   Allergic/Immunologic: Negative.   Neurological: Negative.   Hematological: Negative.   Psychiatric/Behavioral: Negative.        Objective:   Physical Exam        Assessment & Plan:   Patient is a 66 yr old female  HIV- undetectable for 9+ years; no other major issues except B/L hip DJD and s/p L THR with infection noted s/p surgery- Had ear infection of L ear and then had 3 surgeries of L hip. Hx of anterior hip replacements on L and R hip replacement.  Had epidural for Hip pain. Also has nerve pain and myofascial pain.   Will do UDS at next appointment  2. Refilled pain meds- Oxyocodne 5 mg TID #90   3. Will see back in 3 months   4. If coughing doesn't improve by tomorrow, go to urgent car,e not ER- will take less time.

## 2022-07-07 ENCOUNTER — Encounter: Payer: Self-pay | Admitting: Physical Medicine and Rehabilitation

## 2022-07-07 ENCOUNTER — Other Ambulatory Visit: Payer: Self-pay

## 2022-07-07 ENCOUNTER — Emergency Department (HOSPITAL_BASED_OUTPATIENT_CLINIC_OR_DEPARTMENT_OTHER): Payer: Medicare Other

## 2022-07-07 ENCOUNTER — Emergency Department (HOSPITAL_BASED_OUTPATIENT_CLINIC_OR_DEPARTMENT_OTHER)
Admission: EM | Admit: 2022-07-07 | Discharge: 2022-07-07 | Disposition: A | Discharge status: Home / Self Care | Payer: Medicare Other | Attending: Emergency Medicine | Admitting: Emergency Medicine

## 2022-07-07 DIAGNOSIS — F17200 Nicotine dependence, unspecified, uncomplicated: Secondary | ICD-10-CM | POA: Diagnosis not present

## 2022-07-07 DIAGNOSIS — J209 Acute bronchitis, unspecified: Secondary | ICD-10-CM

## 2022-07-07 DIAGNOSIS — Z7982 Long term (current) use of aspirin: Secondary | ICD-10-CM | POA: Diagnosis not present

## 2022-07-07 DIAGNOSIS — J029 Acute pharyngitis, unspecified: Secondary | ICD-10-CM | POA: Insufficient documentation

## 2022-07-07 DIAGNOSIS — Z20822 Contact with and (suspected) exposure to covid-19: Secondary | ICD-10-CM | POA: Insufficient documentation

## 2022-07-07 DIAGNOSIS — R051 Acute cough: Secondary | ICD-10-CM | POA: Diagnosis present

## 2022-07-07 LAB — CBC WITH DIFFERENTIAL/PLATELET
Abs Immature Granulocytes: 0.03 10*3/uL (ref 0.00–0.07)
Basophils Absolute: 0 10*3/uL (ref 0.0–0.1)
Basophils Relative: 0 %
Eosinophils Absolute: 0.4 10*3/uL (ref 0.0–0.5)
Eosinophils Relative: 5 %
HCT: 36.6 % (ref 36.0–46.0)
Hemoglobin: 12.4 g/dL (ref 12.0–15.0)
Immature Granulocytes: 0 %
Lymphocytes Relative: 41 %
Lymphs Abs: 3 10*3/uL (ref 0.7–4.0)
MCH: 30.7 pg (ref 26.0–34.0)
MCHC: 33.9 g/dL (ref 30.0–36.0)
MCV: 90.6 fL (ref 80.0–100.0)
Monocytes Absolute: 0.7 10*3/uL (ref 0.1–1.0)
Monocytes Relative: 10 %
Neutro Abs: 3.2 10*3/uL (ref 1.7–7.7)
Neutrophils Relative %: 44 %
Platelets: 222 10*3/uL (ref 150–400)
RBC: 4.04 MIL/uL (ref 3.87–5.11)
RDW: 13.6 % (ref 11.5–15.5)
WBC: 7.2 10*3/uL (ref 4.0–10.5)
nRBC: 0 % (ref 0.0–0.2)

## 2022-07-07 LAB — BASIC METABOLIC PANEL
Anion gap: 5 (ref 5–15)
BUN: 19 mg/dL (ref 8–23)
CO2: 28 mmol/L (ref 22–32)
Calcium: 9.2 mg/dL (ref 8.9–10.3)
Chloride: 107 mmol/L (ref 98–111)
Creatinine, Ser: 1.43 mg/dL — ABNORMAL HIGH (ref 0.44–1.00)
GFR, Estimated: 40 mL/min — ABNORMAL LOW (ref >60)
Glucose, Bld: 105 mg/dL — ABNORMAL HIGH (ref 70–99)
Potassium: 4 mmol/L (ref 3.5–5.1)
Sodium: 140 mmol/L (ref 135–145)

## 2022-07-07 LAB — RESP PANEL BY RT-PCR (FLU A&B, COVID) ARPGX2
Influenza A by PCR: NEGATIVE
Influenza B by PCR: NEGATIVE
SARS Coronavirus 2 by RT PCR: NEGATIVE

## 2022-07-07 MED ORDER — AZITHROMYCIN 250 MG PO TABS: 250.0000 mg | ORAL_TABLET | Freq: Every day | ORAL | 0 refills | Status: DC

## 2022-07-07 MED ORDER — DEXAMETHASONE 4 MG PO TABS
10.0000 mg | ORAL_TABLET | Freq: Once | ORAL | Status: AC
  Administered 2022-07-07: 10 mg via ORAL
  Filled 2022-07-07: qty 3

## 2022-07-07 MED ORDER — ALBUTEROL SULFATE HFA 108 (90 BASE) MCG/ACT IN AERS: 2.0000 | INHALATION_SPRAY | RESPIRATORY_TRACT | 0 refills | Status: AC | PRN

## 2022-07-07 MED ORDER — ALBUTEROL SULFATE HFA 108 (90 BASE) MCG/ACT IN AERS
2.0000 | INHALATION_SPRAY | Freq: Once | RESPIRATORY_TRACT | Status: AC
Start: 2022-07-07 — End: 2022-07-07
  Administered 2022-07-07: 2 via RESPIRATORY_TRACT
  Filled 2022-07-07: qty 6.7

## 2022-07-07 NOTE — ED Triage Notes (Signed)
Pt via POV with husband c/o nonproductive cough and body aches ongoing since last Saturday. Sore throat and fever have since resolved. Pt states this feels similar to bronchitis she has had in the past. Two negative at home COVID test. Denies sick exposure. HX HIV, undetected on last PCP appointment two months ago, complaint with antiviral medications.

## 2022-07-07 NOTE — ED Provider Notes (Signed)
[facility] Provider Note   CSN: [id number] Arrival date & time: 07/07/22  0737     History  Chief Complaint  Patient presents with   Cough    Ashley Shepherd is a 66 y.o. female.  The history is provided by the patient.  Cough Ashley Shepherd is a 66 y.o. female who presents to the Emergency Department complaining of cough.  She presents to the emergency department accompanied by her husband for evaluation of cough, fever, body aches and sore throat that started 1 week ago.  Her fever was around 100 last Saturday and Sunday.  Most of her symptoms are improving but her cough is persistent and it is productive.  She does have some upper back soreness related to coughing.  She is only short of breath when she smokes.   No chest pain.  No leg pain/swelling.  No sick contacts. N/v/diaphoresis.   No hx/o dvt/pe, chf, htn.  Has a history of well-controlled HIV on medications.       Home Medications Prior to Admission medications   Medication Sig Start Date End Date Taking? Authorizing Provider  albuterol (VENTOLIN HFA) 108 (90 Base) MCG/ACT inhaler Inhale 2 puffs into the lungs every 4 (four) hours as needed for wheezing or shortness of breath. 07/07/22  Yes Quintella Reichert, MD  azithromycin (ZITHROMAX) 250 MG tablet Take 1 tablet (250 mg total) by mouth daily. Take first 2 tablets together, then 1 every day until finished. 07/07/22  Yes Quintella Reichert, MD  ALPRAZolam Duanne Moron) 0.25 MG tablet Take by mouth. 10/09/21   [provider]  aspirin 81 MG chewable tablet Chew 1 tablet (81 mg total) by mouth 2 (two) times daily. 07/15/21   Mcarthur Rossetti, MD  CALCIUM CITRATE-VITAMIN D3 PO  01/24/21   [provider]  citalopram (CELEXA) 20 MG tablet Take 20 mg by mouth daily. 05/23/18   [provider]  citalopram (CELEXA) 20 MG tablet Take by mouth. 09/29/21   [provider]  dolutegravir (TIVICAY) 50 MG tablet Take 50 mg by  mouth 2 (two) times daily. 02/03/18   [provider]  emtricitabine-tenofovir AF (DESCOVY) 200-25 MG tablet Take 1 tablet by mouth daily. 12/09/17   [provider]  ferrous sulfate 325 (65 FE) MG tablet Take 325 mg by mouth daily with breakfast.    [provider]  gabapentin (NEURONTIN) 300 MG capsule Take 1 capsule (300 mg total) by mouth at bedtime. Take 1 tab nightly x 4 days, then 1 tab 3x/day x 1 week, then 2 tabs/600 mg 2x/day- 02/19/22   Lovorn, Jinny Blossom, MD  maraviroc (SELZENTRY) 300 MG tablet Take 300 mg by mouth 2 (two) times daily. 03/24/18   [provider]  Multiple Vitamin (MULTIVITAMIN WITH MINERALS) TABS tablet Take 1 tablet by mouth daily.    [provider]  naloxone [facility]) nasal spray 4 mg/0.1 mL SMARTSIG:Both Nares 07/31/21   [provider]  oxyCODONE (ROXICODONE) 5 MG immediate release tablet Take 1 tablet (5 mg total) by mouth every 8 (eight) hours as needed for moderate pain. 07/06/22   Lovorn, Jinny Blossom, MD  tiZANidine (ZANAFLEX) 4 MG tablet TAKE 1 TABLET(4 MG) BY MOUTH EVERY 8 HOURS AS NEEDED 03/16/22   Lovorn, Jinny Blossom, MD      Allergies    Iodine, Iodinated contrast media, Sulfa antibiotics, and Codeine    Review of Systems   Review of Systems  Respiratory:  Positive for cough.   All other  systems reviewed and are negative.   Physical Exam Updated Vital Signs BP 132/83 (BP Location: Left Arm)   Pulse 72   Temp 98.3 F (36.8 C) (Oral)   Resp 19   Ht '5\' 10"'$  (1.778 m)   Wt 86.6 kg   SpO2 99%   BMI 27.41 kg/m  Physical Exam Vitals and nursing note reviewed.  Constitutional:      Appearance: She is well-developed.  HENT:     Head: Normocephalic and atraumatic.  Cardiovascular:     Rate and Rhythm: Normal rate and regular rhythm.     Heart sounds: No murmur heard. Pulmonary:     Effort: Pulmonary effort is normal. No respiratory distress.     Comments: Occasional rhonchi Abdominal:     Palpations: Abdomen is  soft.     Tenderness: There is no abdominal tenderness. There is no guarding or rebound.  Musculoskeletal:        General: No swelling or tenderness.  Skin:    General: Skin is warm and dry.  Neurological:     Mental Status: She is alert and oriented to person, place, and time.  Psychiatric:        Behavior: Behavior normal.     ED Results / Procedures / Treatments   Labs (all labs ordered are listed, but only abnormal results are displayed) Labs Reviewed  BASIC METABOLIC PANEL - Abnormal; Notable for the following components:      Result Value   Glucose, Bld 105 (*)    Creatinine, Ser 1.43 (*)    GFR, Estimated 40 (*)    All other components within normal limits  RESP PANEL BY RT-PCR (FLU A&B, COVID) ARPGX2  CBC WITH DIFFERENTIAL/PLATELET    EKG EKG Interpretation  Date/Time:  Saturday July 07 2022 04:17:13 EDT Ventricular Rate:  61 PR Interval:  165 QRS Duration: 85 QT Interval:  413 QTC Calculation: 416 R Axis:   42 Text Interpretation: Sinus rhythm Confirmed by Quintella Reichert [phone]) on 07/07/2022 4:18:37 AM  Radiology DG Chest Port 1 View  Result Date: 07/07/2022 CLINICAL DATA:  66 year old female with shortness of breath. Nonproductive cough, body ache, sore throat and fever. EXAM: PORTABLE CHEST 1 VIEW COMPARISON:  Chest radiographs 01/26/2019 and earlier. FINDINGS: Portable AP upright view at 0402 hours. Lung volumes and mediastinal contours remain normal. Chronic increased pulmonary interstitial markings, stable when allowing for differences in technique. Allowing for portable technique the lungs are clear. No pneumothorax or pleural effusion. Left AC joint degeneration since 2020. No acute osseous abnormality identified. Negative visible bowel gas. IMPRESSION: No acute cardiopulmonary abnormality. Electronically Signed   By: Genevie Ann M.D.   On: 07/07/2022 04:35    Procedures Procedures    Medications Ordered in ED Medications  albuterol (VENTOLIN HFA) 108  (90 Base) MCG/ACT inhaler 2 puff (2 puffs Inhalation Given 07/07/22 0439)  dexamethasone (DECADRON) tablet 10 mg (10 mg Oral Given 07/07/22 0559)    ED Course/ Medical Decision Making/ A&P                           Medical Decision Making Amount and/or Complexity of Data Reviewed Labs: ordered. Radiology: ordered.  Risk Prescription drug management.   Patient with history of tobacco abuse here for evaluation of persistent cough after recent illness.  She does have rhonchi on examination without respiratory distress.  Chest x-Dial is negative for acute infiltrate, images personally reviewed.  Labs are at her baseline with  stable renal function.  She was treated with albuterol in the emergency department with improvement in her symptoms.  On repeat evaluation lungs are clear bilaterally.  Suspect patient does have an element of reactive airway given her frequent history of responding to inhalers with upper respiratory infections.  We will treat with one-time dose of steroids.  We will treat also with azithromycin for anti-inflammatory effects and antibacterial and effects due to concern for possible early developing bacterial infection.  Discussed with patient importance of close outpatient follow-up as well as return precautions.  Current clinical picture is not consistent with PE, CHF, ACS.        Final Clinical Impression(s) / ED Diagnoses Final diagnoses:  Acute bronchitis, unspecified organism    Rx / DC Orders ED Discharge Orders          Ordered    azithromycin (ZITHROMAX) 250 MG tablet  Daily        07/07/22 0547    albuterol (VENTOLIN HFA) 108 (90 Base) MCG/ACT inhaler  Every 4 hours PRN        07/07/22 0548              Quintella Reichert, MD 07/07/22 [phone]

## 2022-08-06 ENCOUNTER — Other Ambulatory Visit: Payer: Self-pay | Admitting: Physical Medicine and Rehabilitation

## 2022-08-07 MED ORDER — OXYCODONE HCL 5 MG PO TABS: 5.0000 mg | ORAL_TABLET | Freq: Three times a day (TID) | ORAL | 0 refills | Status: DC | PRN

## 2022-08-07 NOTE — Telephone Encounter (Signed)
PMP was Reviewed.  Dr Dagoberto Ligas note reviewed. Oxycodone e-scribed today

## 2022-08-07 NOTE — Telephone Encounter (Signed)
Dr. Dagoberto Ligas is out of the office this week.  Will you please send in the Oxycodone 5 mg? The patient will call back with a location that has it in stock.

## 2022-08-29 ENCOUNTER — Other Ambulatory Visit: Payer: Self-pay | Admitting: Physical Medicine and Rehabilitation

## 2022-09-02 ENCOUNTER — Other Ambulatory Visit: Payer: Self-pay | Admitting: Physical Medicine and Rehabilitation

## 2022-09-07 ENCOUNTER — Encounter: Payer: Self-pay | Admitting: Physical Medicine and Rehabilitation

## 2022-09-07 ENCOUNTER — Other Ambulatory Visit (HOSPITAL_BASED_OUTPATIENT_CLINIC_OR_DEPARTMENT_OTHER): Payer: Self-pay

## 2022-09-07 ENCOUNTER — Telehealth: Payer: Self-pay | Admitting: Physical Medicine and Rehabilitation

## 2022-09-07 MED ORDER — OXYCODONE HCL 5 MG PO TABS: 5.0000 mg | ORAL_TABLET | Freq: Three times a day (TID) | ORAL | 0 refills | Status: DC | PRN

## 2022-09-07 MED ORDER — OXYCODONE HCL 5 MG PO TABS
5.0000 mg | ORAL_TABLET | Freq: Three times a day (TID) | ORAL | 0 refills | Status: DC | PRN
  Filled 2022-09-07: qty 90, 30d supply, fill #0

## 2022-09-07 NOTE — Telephone Encounter (Signed)
Medication requested sent to the pharmacy.

## 2022-10-05 ENCOUNTER — Other Ambulatory Visit (HOSPITAL_BASED_OUTPATIENT_CLINIC_OR_DEPARTMENT_OTHER): Payer: Self-pay

## 2022-10-05 ENCOUNTER — Other Ambulatory Visit: Payer: Self-pay | Admitting: Physical Medicine and Rehabilitation

## 2022-10-05 MED ORDER — OXYCODONE HCL 5 MG PO TABS
5.0000 mg | ORAL_TABLET | Freq: Three times a day (TID) | ORAL | 0 refills | Status: DC | PRN
  Filled 2022-10-05: qty 90, 30d supply, fill #0

## 2022-10-05 NOTE — Telephone Encounter (Signed)
PMP was Reviewed.  Oxycodone e- scribed to pharmacy.  Call Placed to Ms. Roycroft, she verbalizes understanding. She was instructed to keep her appointment with Dr Dagoberto Ligas on Monday.

## 2022-10-08 ENCOUNTER — Encounter: Payer: Self-pay | Admitting: Physical Medicine and Rehabilitation

## 2022-10-08 ENCOUNTER — Encounter
Payer: Medicare Other | Attending: Physical Medicine and Rehabilitation | Admitting: Physical Medicine and Rehabilitation

## 2022-10-08 VITALS — BP 138/91 | HR 81 | Ht 70.0 in | Wt 191.2 lb

## 2022-10-08 DIAGNOSIS — M7918 Myalgia, other site: Secondary | ICD-10-CM | POA: Insufficient documentation

## 2022-10-08 DIAGNOSIS — M25551 Pain in right hip: Secondary | ICD-10-CM | POA: Diagnosis present

## 2022-10-08 DIAGNOSIS — M792 Neuralgia and neuritis, unspecified: Secondary | ICD-10-CM | POA: Diagnosis present

## 2022-10-08 DIAGNOSIS — G894 Chronic pain syndrome: Secondary | ICD-10-CM | POA: Insufficient documentation

## 2022-10-08 DIAGNOSIS — Z96642 Presence of left artificial hip joint: Secondary | ICD-10-CM | POA: Diagnosis present

## 2022-10-08 DIAGNOSIS — M545 Low back pain, unspecified: Secondary | ICD-10-CM | POA: Diagnosis present

## 2022-10-08 DIAGNOSIS — G8929 Other chronic pain: Secondary | ICD-10-CM | POA: Insufficient documentation

## 2022-10-08 DIAGNOSIS — Z96641 Presence of right artificial hip joint: Secondary | ICD-10-CM | POA: Insufficient documentation

## 2022-10-08 DIAGNOSIS — Z5181 Encounter for therapeutic drug level monitoring: Secondary | ICD-10-CM | POA: Diagnosis not present

## 2022-10-08 DIAGNOSIS — Z79891 Long term (current) use of opiate analgesic: Secondary | ICD-10-CM | POA: Insufficient documentation

## 2022-10-08 NOTE — Patient Instructions (Addendum)
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 Hx of anterior hip replacements on L and R hip replacement.  Had epidural for Hip pain. Also has nerve pain and myofascial pain.    Pt needs UDS today- is due, esp since last visit was a video visit.   2. Has taken "everything" OTC and not working- still has bad cough- suggested going to Urgent care.   3. Demonstrated to pt how to do myofascial release with theracane- hold pressure on MUSCLES, NOT BONE!- start lighter and add pressure over the 2 minutes- should relax the muscles some.    4. Get exercise ball at [employer]- get one based on height- blow up to knees 90 degrees on it- sit on it- 30 minutes/day- sit on ball instead of cough or dining room chair- start at 30 minutes at least 5 days/week and goal of 45-60 minutes- doesn't have to be in a row- but goal 60 minutes/day.   5. Will increase next Rx to 4 pills/day- they can be broken- so can take 2 of 3 times can take 1.5 pills not 1 pill. Just got refill 10/13, so not due yet.Can increase to 4 pills/day now- so will run out of pain meds at 3 weeks.     6. If that doesn't work, can try trigger point injections to relax muscle sin mid and low back next visit.    7. Just got Gabapentin refilled 600 mg 3x/day- continue regimen   8. Con't Zanaflex- takes 2x/day- - just got refilled last month- isn't due yet.    9. Can use tennis ball on low back 2-4 minutes on on areas that are tight- only on muscles- not bone!  10. FU in 3 months- on chronic pain.   11. Have PCP send you to Orthopedics- see if they can do injection for trigger finger

## 2022-10-08 NOTE — Progress Notes (Signed)
Subjective:    Patient ID: Ashley Shepherd, female    DOB: [birth year], 66 y.o.   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 Hx of anterior hip replacements on L and R hip replacement.  Had epidural for Hip pain. Also has nerve pain and myofascial pain.   Things doing so-so.   Has been having more pain in back - Keeps twin grandchildren- 23 lbs and 19 lbs- - 65 months old last week- hurts more last 48 hours or so- cold makes it worse.   Also hurting when lifts grandbabies and even when not lifting them.   Not having shin splints and actually hurting on shin bone.   Takes  2 pain pills at a time- takes 3x/day- ran out early last month.   Negative for COVID- has been coughing.  Doesn't have appointment til 10/24.  Suggested urgent care for this.    Tried theracane- it hurt- was putting on bone Has been putting icyhot on legs and shoulders-   Back hurts just in back, no radiation down leg.   Pain Inventory Average Pain 7 Pain Right Now 8 My pain is sharp, burning, tingling, and aching  In the last 24 hours, has pain interfered with the following? General activity 4 Relation with others 4 Enjoyment of life 5 What TIME of day is your pain at its worst? daytime and night Sleep (in general) NA  Pain is worse with: walking, bending, standing, and some activites Pain improves with: rest and medication Relief from Meds: 5  Family History  Problem Relation Age of Onset   Hypertension Father    Diabetes Father    Heart attack Father    Hypertension Other    Diabetes Other    Social History   Socioeconomic History   Marital status: Widowed    Spouse name: Not on file   Number of children: Not on file   Years of education: Not on file   Highest education level: Not on file   Occupational History   Not on file  Tobacco Use   Smoking status: Every Day    Packs/day: 0.50    Types: Cigarettes   Smokeless tobacco: Never  Vaping Use   Vaping Use: Never used  Substance and Sexual Activity   Alcohol use: No   Drug use: No   Sexual activity: Not on file  Other Topics Concern   Not on file  Social History Narrative   Not on file   Social Determinants of Health   Financial Resource Strain: Not on file  Food Insecurity: Not on file  Transportation Needs: Not on file  Physical Activity: Not on file  Stress: Not on file  Social Connections: Not on file   Past Surgical History:  Procedure Laterality Date   ANTERIOR HIP REVISION Left 04/06/2016   Procedure: REMOVAL OF TEMPORARY ANTIBIOTIC SPACER LEFT HIP, LEFT TOTAL HIP REVISION;  Surgeon: Mcarthur Rossetti, MD;  Location: [facility];  Service: Orthopedics;  Laterality: Left;   ANTERIOR HIP REVISION Left 08/02/2017   Procedure: Irrigation and debridement left hip with antibiotic bead placement;  Surgeon: Mcarthur Rossetti, MD;  Location: [facility];  Service: Orthopedics;  Laterality: Left;   APPENDECTOMY     COLON SURGERY  2008   COLOSTOMY REVERSAL  2013   EXCISIONAL TOTAL HIP ARTHROPLASTY WITH ANTIBIOTIC SPACERS Left 01/24/2016   Procedure: EXCISION OF LEFT TOTAL HIP ARTHROPLASTY WITH PLACEMENT OF ANTIBIOTIC SPACERS;  Surgeon: Mcarthur Rossetti, MD;  Location: [facility];  Service: Orthopedics;  Laterality: Left;   INCISION AND DRAINAGE HIP Left 01/24/2016   Procedure: IRRIGATION AND DEBRIDEMENT LEFT HIP;  Surgeon: Mcarthur Rossetti, MD;  Location: [facility];  Service: Orthopedics;  Laterality: Left;   KNEE ARTHROSCOPY     left   PORT-A-CATH REMOVAL     TOTAL HIP ARTHROPLASTY Left 10/01/2014   Procedure: LEFT TOTAL HIP ARTHROPLASTY ANTERIOR APPROACH;  Surgeon: Mcarthur Rossetti, MD;  Location: [facility];  Service: Orthopedics;  Laterality: Left;   TOTAL HIP ARTHROPLASTY Right 07/14/2021   Procedure:  RIGHT TOTAL HIP ARTHROPLASTY ANTERIOR APPROACH;  Surgeon: Mcarthur Rossetti, MD;  Location: [facility];  Service: Orthopedics;  Laterality: Right;   Past Surgical History:  Procedure Laterality Date   ANTERIOR HIP REVISION Left 04/06/2016   Procedure: REMOVAL OF TEMPORARY ANTIBIOTIC SPACER LEFT HIP, LEFT TOTAL HIP REVISION;  Surgeon: Mcarthur Rossetti, MD;  Location: [facility];  Service: Orthopedics;  Laterality: Left;   ANTERIOR HIP REVISION Left 08/02/2017   Procedure: Irrigation and debridement left hip with antibiotic bead placement;  Surgeon: Mcarthur Rossetti, MD;  Location: [facility];  Service: Orthopedics;  Laterality: Left;   APPENDECTOMY     COLON SURGERY  2008   COLOSTOMY REVERSAL  2013   EXCISIONAL TOTAL HIP ARTHROPLASTY WITH ANTIBIOTIC SPACERS Left 01/24/2016   Procedure: EXCISION OF LEFT TOTAL HIP ARTHROPLASTY WITH PLACEMENT OF ANTIBIOTIC SPACERS;  Surgeon: Mcarthur Rossetti, MD;  Location: [facility];  Service: Orthopedics;  Laterality: Left;   INCISION AND DRAINAGE HIP Left 01/24/2016   Procedure: IRRIGATION AND DEBRIDEMENT LEFT HIP;  Surgeon: Mcarthur Rossetti, MD;  Location: [place];  Service: Orthopedics;  Laterality: Left;   KNEE ARTHROSCOPY     left   PORT-A-CATH REMOVAL     TOTAL HIP ARTHROPLASTY Left 10/01/2014   Procedure: LEFT TOTAL HIP ARTHROPLASTY ANTERIOR APPROACH;  Surgeon: Mcarthur Rossetti, MD;  Location: [facility];  Service: Orthopedics;  Laterality: Left;   TOTAL HIP ARTHROPLASTY Right 07/14/2021   Procedure: RIGHT TOTAL HIP ARTHROPLASTY ANTERIOR APPROACH;  Surgeon: Mcarthur Rossetti, MD;  Location: [facility];  Service: Orthopedics;  Laterality: Right;   Past Medical History:  Diagnosis Date   Anemia    Anxiety    Arthritis    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[place])    left   Cancer ([place])    7 years ago, cancer free now   Depression    Difficulty sleeping    GERD (gastroesophageal reflux disease)    History of kidney stones    History of  transfusion    HIV disease ([facility])    Kidney stones    Pneumonia    BP (!) 138/91   Pulse 81   Ht '5\' 10"'$  (1.778 m)   Wt 191 lb 3.2 oz (86.7 kg)   SpO2 98%   BMI 27.43 kg/m   Opioid Risk Score:   Fall Risk Score:  `1  Depression screen PHQ 2/9     10/08/2022   12:05 PM 07/06/2022    3:32 PM 11/10/2021    2:21 PM 01/04/2021    9:22 AM 06/03/2020    9:55 AM 01/29/2020   10:38 AM 04/08/2018    8:55 AM  Depression screen PHQ 2/9  Decreased Interest 0 0 0 0 0 0 0  Down, Depressed, Hopeless 0 0 0 0 0 0 0  PHQ - 2 Score 0 0 0 0 0 0 0  Altered sleeping      2   Tired, decreased energy      1   Change in appetite      0   Feeling bad or failure about yourself       0   Trouble concentrating      0   Moving slowly or fidgety/restless      0   Suicidal thoughts      0   PHQ-9 Score      3      Review of Systems  Constitutional: Negative.   HENT: Negative.    Eyes: Negative.   Respiratory: Negative.    Cardiovascular: Negative.   Gastrointestinal: Negative.   Endocrine: Negative.   Genitourinary: Negative.   Musculoskeletal:  Positive for back pain.       Shoulders knees hands  Skin: Negative.   Allergic/Immunologic: Negative.   Neurological: Negative.   Hematological: Negative.   Psychiatric/Behavioral: Negative.    All other systems reviewed and are negative.      Objective:   Physical Exam  Awake, alert, appropriate; no assistive device, NAD TTP over groin R>L due to hip arthiritis- s/p B/L THR's.  Also tight trigger points in rhomboids.  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 Hx of anterior hip replacements on L and R hip replacement.  Had epidural for Hip pain. Also has nerve pain and myofascial pain.    Pt  needs UDS today- is due, esp since last visit was a video visit.   2. Has taken "everything" OTC and not working- still has bad cough- suggested going to Urgent care.   3. Demonstrated to pt how to do myofascial release with theracane- hold pressure on MUSCLES, NOT BONE!- start lighter and add pressure over the 2 minutes- should relax the muscles some.    4. Get exercise ball at [employer]- get one based on height- blow up to knees 90 degrees on it- sit on it- 30 minutes/day- sit on ball instead of cough or dining room chair- start at 30 minutes at least 5 days/week and goal of 45-60 minutes- doesn't have to be in a row- but goal 60 minutes/day.   5. Will increase next Rx to 4 pills/day- they can be broken- so can take 2 of 3 times can take 1.5 pills not 1 pill. Just got refill 10/13, so not due yet.Cna increase to 4 pills/day now- so will run out of pain meds at 3 weeks.    6. If that doesn't work, can try trigger point injections to relax muscle sin mid and low back next visit.    7. Just got Gabapentin refilled 600 mg 3x/day- continue regimen   8. Con't Zanaflex- takes 2x/day- - just got refilled last month- isn't due yet.    9. Can use tennis ball on low back 2-4 minutes on on areas that are tight- only on muscles- not bone!  10. FU in 3 months- on chronic pain.  11. Have PCP send you to Orthopedics- see if they can do injection for trigger finger   I spent a total of  23  minutes on total care today- >50% coordination of care- due to discussing options for myofascial pain and exercises; demonstrated Myofascial release. And discussion of increasing pain meds.

## 2022-10-11 LAB — TOXASSURE SELECT,+ANTIDEPR,UR

## 2022-10-15 ENCOUNTER — Telehealth: Payer: Self-pay | Admitting: *Deleted

## 2022-10-15 NOTE — Telephone Encounter (Signed)
Urine drug screen is positive for prescribed citalopram and ethyl Glucuronide. This is a metabolite of alcohol and glucose fermentation. Being that Ethyl Sulfate is not present to confirm alcohol, this is most likely from elevated blood sugar which has been confirmed by past BMPs.  However she reported taking her oxycodone on the day of the test and it is not present. It has been present in previous tests. She was not out of her medication. Recommend retest at next visit. Next visit is not until January 17.

## 2022-11-06 ENCOUNTER — Other Ambulatory Visit: Payer: Self-pay | Admitting: Registered Nurse

## 2022-11-06 ENCOUNTER — Other Ambulatory Visit (HOSPITAL_BASED_OUTPATIENT_CLINIC_OR_DEPARTMENT_OTHER): Payer: Self-pay

## 2022-11-06 MED ORDER — OXYCODONE HCL 5 MG PO TABS
5.0000 mg | ORAL_TABLET | Freq: Three times a day (TID) | ORAL | 0 refills | Status: DC | PRN
  Filled 2022-11-06: qty 90, 30d supply, fill #0

## 2022-12-05 ENCOUNTER — Other Ambulatory Visit: Payer: Self-pay | Admitting: Physical Medicine and Rehabilitation

## 2022-12-05 ENCOUNTER — Other Ambulatory Visit (HOSPITAL_BASED_OUTPATIENT_CLINIC_OR_DEPARTMENT_OTHER): Payer: Self-pay

## 2022-12-06 ENCOUNTER — Other Ambulatory Visit (HOSPITAL_BASED_OUTPATIENT_CLINIC_OR_DEPARTMENT_OTHER): Payer: Self-pay

## 2022-12-06 MED ORDER — OXYCODONE HCL 5 MG PO TABS
5.0000 mg | ORAL_TABLET | Freq: Three times a day (TID) | ORAL | 0 refills | Status: DC | PRN
  Filled 2022-12-06: qty 90, 30d supply, fill #0

## 2022-12-06 NOTE — Telephone Encounter (Signed)
Patient requesting refill on oxycodone per pmp last fill 11/06/22   Filled  Written  ID  Drug  QTY  Days  Prescriber  RX #  Dispenser  Refill  Daily Dose*  Pymt Type  PMP  11/06/2022 11/06/2022 2  Oxycodone Hcl (Ir) 5 Mg Tablet 90.00 30 Me Lov [id number] Con (5269) 0/0 22.50 MME Other [facility]

## 2022-12-06 NOTE — Telephone Encounter (Signed)
PMP was Reviewed.  Dr Dagoberto Ligas Note was reviewed and UDS results.  Repeat UDS at scheduled appointment.

## 2023-01-03 ENCOUNTER — Other Ambulatory Visit: Payer: Self-pay | Admitting: Registered Nurse

## 2023-01-03 ENCOUNTER — Other Ambulatory Visit: Payer: Self-pay

## 2023-01-03 ENCOUNTER — Other Ambulatory Visit (HOSPITAL_BASED_OUTPATIENT_CLINIC_OR_DEPARTMENT_OTHER): Payer: Self-pay

## 2023-01-03 MED ORDER — OXYCODONE HCL 5 MG PO TABS
5.0000 mg | ORAL_TABLET | Freq: Three times a day (TID) | ORAL | 0 refills | Status: DC | PRN
  Filled 2023-01-03: qty 90, 30d supply, fill #0

## 2023-01-09 ENCOUNTER — Encounter: Payer: Medicare Other | Admitting: Physical Medicine and Rehabilitation

## 2023-02-05 ENCOUNTER — Other Ambulatory Visit (HOSPITAL_BASED_OUTPATIENT_CLINIC_OR_DEPARTMENT_OTHER): Payer: Self-pay

## 2023-02-05 ENCOUNTER — Other Ambulatory Visit: Payer: Self-pay | Admitting: Physical Medicine and Rehabilitation

## 2023-02-06 ENCOUNTER — Other Ambulatory Visit (HOSPITAL_BASED_OUTPATIENT_CLINIC_OR_DEPARTMENT_OTHER): Payer: Self-pay

## 2023-02-06 MED ORDER — OXYCODONE HCL 5 MG PO TABS
5.0000 mg | ORAL_TABLET | Freq: Three times a day (TID) | ORAL | 0 refills | Status: DC | PRN
  Filled 2023-02-06: qty 90, 30d supply, fill #0

## 2023-02-12 ENCOUNTER — Other Ambulatory Visit: Payer: Self-pay | Admitting: Physical Medicine and Rehabilitation

## 2023-02-16 ENCOUNTER — Other Ambulatory Visit: Payer: Self-pay | Admitting: Physical Medicine and Rehabilitation

## 2023-02-18 ENCOUNTER — Telehealth: Payer: Self-pay | Admitting: Physical Medicine & Rehabilitation

## 2023-02-18 ENCOUNTER — Encounter: Payer: Self-pay | Admitting: Physical Medicine and Rehabilitation

## 2023-02-18 ENCOUNTER — Other Ambulatory Visit: Payer: Self-pay | Admitting: *Deleted

## 2023-02-18 MED ORDER — TIZANIDINE HCL 4 MG PO TABS: ORAL_TABLET | ORAL | 5 refills | Status: DC

## 2023-02-18 NOTE — Telephone Encounter (Signed)
Patient called and said the pharmacy is telling her they didn't receive the tiZANidine (ZANAFLEX) 4 MG tablet script that Dr. Letta Pate sent on 02/12/23

## 2023-02-18 NOTE — Telephone Encounter (Signed)
Refills got cancelled. I have refilled it for her.

## 2023-02-25 ENCOUNTER — Other Ambulatory Visit: Payer: Self-pay | Admitting: Physical Medicine and Rehabilitation

## 2023-02-28 ENCOUNTER — Encounter: Payer: Self-pay | Admitting: Radiology

## 2023-03-06 ENCOUNTER — Other Ambulatory Visit (HOSPITAL_BASED_OUTPATIENT_CLINIC_OR_DEPARTMENT_OTHER): Payer: Self-pay

## 2023-03-06 ENCOUNTER — Other Ambulatory Visit: Payer: Self-pay | Admitting: Physical Medicine and Rehabilitation

## 2023-03-06 MED ORDER — OXYCODONE HCL 5 MG PO TABS
5.0000 mg | ORAL_TABLET | Freq: Three times a day (TID) | ORAL | 0 refills | Status: DC | PRN
  Filled 2023-03-06: qty 90, 30d supply, fill #0

## 2023-03-07 ENCOUNTER — Other Ambulatory Visit (HOSPITAL_BASED_OUTPATIENT_CLINIC_OR_DEPARTMENT_OTHER): Payer: Self-pay

## 2023-03-11 ENCOUNTER — Encounter: Payer: Self-pay | Admitting: Physical Medicine and Rehabilitation

## 2023-03-11 ENCOUNTER — Encounter
Payer: Medicare Other | Attending: Physical Medicine and Rehabilitation | Admitting: Physical Medicine and Rehabilitation

## 2023-03-11 VITALS — BP 132/79 | HR 81 | Ht 70.0 in | Wt 195.6 lb

## 2023-03-11 DIAGNOSIS — G8929 Other chronic pain: Secondary | ICD-10-CM | POA: Diagnosis present

## 2023-03-11 DIAGNOSIS — G894 Chronic pain syndrome: Secondary | ICD-10-CM | POA: Diagnosis not present

## 2023-03-11 DIAGNOSIS — M7918 Myalgia, other site: Secondary | ICD-10-CM | POA: Insufficient documentation

## 2023-03-11 DIAGNOSIS — M25551 Pain in right hip: Secondary | ICD-10-CM | POA: Insufficient documentation

## 2023-03-11 MED ORDER — LIDOCAINE HCL 1 % IJ SOLN
3.0000 mL | Freq: Once | INTRAMUSCULAR | Status: AC
  Administered 2023-03-11: 3 mL

## 2023-03-11 NOTE — Patient Instructions (Addendum)
Plan: Trigger point injections- Patient here for trigger point injections for  Consent done and on chart.  Cleaned areas with alcohol and injected using a 27 gauge 1.5 inch needle  Injected 3cc-  Using 1% Lidocaine with no EPI  Upper traps B/L  Levators- B/L  Posterior scalenes Middle scalenes Splenius Capitus Pectoralis Major Rhomboids- B/L  Infraspinatus Teres Major/minor Thoracic paraspinals Lumbar paraspinals Other injections-    Patient's level of pain prior was 4/10- worse when walks Current level of pain after injections is  There was no bleeding or complications.  Patient was advised to drink a lot of water on day after injections to flush system Will have increased soreness for 12-48 hours after injections.  Can use Lidocaine patches the day AFTER injections Can use theracane on day of injections in places didn't inject Can use heating pad 4-6 hours AFTER injections   2. Con't Oxycodone- 5 mg- -just got refill, so doesn't need today.   3. Pain in R shin- TTP over tibia on R-  might have stress fracture- use ace bandage- using arthritis cream- but I suggest- use voltaren gel 4x/day- if the pain in leg doesn't get better, give 1 week and call me- will get xray if not better.   4. F/U in 95months-

## 2023-03-11 NOTE — Progress Notes (Signed)
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 Hx of anterior hip replacements on L and R hip replacement.  Had epidural for Hip pain. Also has nerve pain and myofascial pain.    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  Still having pain in upper neck and back and middle back Still stiff as a board    Plan: Trigger point injections- Patient here for trigger point injections for  Consent done and on chart.  Cleaned areas with alcohol and injected using a 27 gauge 1.5 inch needle  Injected 3cc-  Using 1% Lidocaine with no EPI  Upper traps B/L  Levators- B/L  Posterior scalenes Middle scalenes Splenius Capitus Pectoralis Major Rhomboids- B/L  Infraspinatus Teres Major/minor Thoracic paraspinals Lumbar paraspinals Other injections-    Patient's level of pain prior was 4/10- worse when walks Current level of pain after injections is  There was no bleeding or complications.  Patient was advised to drink a lot of water on day after injections to flush system Will have increased soreness for 12-48 hours after injections.  Can use Lidocaine patches the day AFTER injections Can use theracane on day of injections in places didn't inject Can use heating pad 4-6 hours AFTER injections   2. Con't Oxycodone- 5 mg- -just got refill, so doesn't need today.   3. Pain in R shin- TTP over tibia on R-  might have stress fracture- use ace bandage- using arthritis cream- but I suggest- use voltaren gel 4x/day- if the pain in leg doesn't get better, give 1 week and call me- will get xray if not better.   4. F/U in 18months  5. UDS- not due today- will do in future visits.    I spent a total of  23 minutes on  total care today- >50% coordination of care- due to 10 minutes on injections and rest discussing chronic pain and plan as above.

## 2023-03-26 ENCOUNTER — Other Ambulatory Visit: Payer: Self-pay | Admitting: Physical Medicine and Rehabilitation

## 2023-03-30 ENCOUNTER — Emergency Department (HOSPITAL_BASED_OUTPATIENT_CLINIC_OR_DEPARTMENT_OTHER): Payer: Medicare Other

## 2023-03-30 ENCOUNTER — Other Ambulatory Visit: Payer: Self-pay

## 2023-03-30 ENCOUNTER — Emergency Department (HOSPITAL_BASED_OUTPATIENT_CLINIC_OR_DEPARTMENT_OTHER)
Admission: EM | Admit: 2023-03-30 | Discharge: 2023-03-30 | Disposition: A | Discharge status: Home / Self Care | Payer: Medicare Other | Attending: Emergency Medicine | Admitting: Emergency Medicine

## 2023-03-30 DIAGNOSIS — Z7982 Long term (current) use of aspirin: Secondary | ICD-10-CM | POA: Insufficient documentation

## 2023-03-30 DIAGNOSIS — M79604 Pain in right leg: Secondary | ICD-10-CM | POA: Diagnosis present

## 2023-03-30 NOTE — ED Triage Notes (Signed)
Reported was playing with her big dog two weeks ago and started to have some pain on her knee down the front side of her leg. States was seen by PCP and was told she has shin splints. C/O difficulty bearing weight.

## 2023-03-30 NOTE — ED Provider Notes (Signed)
EMERGENCY DEPARTMENT AT [facility] Provider Note   CSN: [id number] Arrival date & time: 03/30/23  1703     History  Chief Complaint  Patient presents with   Leg Pain    Ashley Shepherd is a 67 y.o. female.  67 year old female with a past medical history of asthma presents to the ED status post right leg injury.  Patient reports she has a small [place] puppy at home, who charged at her proximal a week and a half ago, she reports the puppy went on to the frontal aspect of her right shin, she is now complaining of pain at her shin, also swelling to her right knee.  Pain is exacerbated with standing, weightbearing, flexion.  She was evaluated by pain management, to her that she likely had shinsplints, she is concerned for any small fracture.  She has been taking over-the-counter pain control without much improvement in symptoms.  She denies any bite, no other complaints reported.  The history is provided by the patient.  Leg Pain Associated symptoms: no fever        Home Medications Prior to Admission medications   Medication Sig Start Date End Date Taking? Authorizing Provider  albuterol (VENTOLIN HFA) 108 (90 Base) MCG/ACT inhaler Inhale 2 puffs into the lungs every 4 (four) hours as needed for wheezing or shortness of breath. 07/07/22   Tilden Fossaees, Elizabeth, MD  ALPRAZolam Prudy Feeler(XANAX) 0.25 MG tablet Take by mouth. 10/09/21   [provider]  aspirin 81 MG chewable tablet Chew 1 tablet (81 mg total) by mouth 2 (two) times daily. 07/15/21   Kathryne HitchBlackman, Christopher Y, MD  CALCIUM CITRATE-VITAMIN D3 PO  01/24/21   [provider]  citalopram (CELEXA) 20 MG tablet Take 20 mg by mouth daily. 05/23/18   [provider]  citalopram (CELEXA) 20 MG tablet Take by mouth. 09/29/21   [provider]  dolutegravir (TIVICAY) 50 MG tablet Take 50 mg by mouth 2 (two) times daily. 02/03/18   [provider]  emtricitabine-tenofovir AF (DESCOVY)  200-25 MG tablet Take 1 tablet by mouth daily. 12/09/17   [provider]  ferrous sulfate 325 (65 FE) MG tablet Take 325 mg by mouth daily with breakfast.    [provider]  gabapentin (NEURONTIN) 300 MG capsule TAKE 2 CAPSULES(600 MG) BY MOUTH TWICE DAILY 03/26/23   Lovorn, Aundra MilletMegan, MD  maraviroc (SELZENTRY) 300 MG tablet Take 300 mg by mouth 2 (two) times daily. 03/24/18   [provider]  Multiple Vitamin (MULTIVITAMIN WITH MINERALS) TABS tablet Take 1 tablet by mouth daily.    [provider]  naloxone [facility](NARCAN) nasal spray 4 mg/0.1 mL SMARTSIG:Both Nares 07/31/21   [provider]  oxyCODONE (ROXICODONE) 5 MG immediate release tablet Take 1 tablet (5 mg total) by mouth every 8 (eight) hours as needed for moderate pain. 03/06/23   Lovorn, Aundra MilletMegan, MD  tiZANidine (ZANAFLEX) 4 MG tablet TAKE 1 TABLET(4 MG) BY MOUTH EVERY 8 HOURS AS NEEDED 02/18/23   Lovorn, Aundra MilletMegan, MD      Allergies    Iodine, Iodinated contrast media, Sulfa antibiotics, and Codeine    Review of Systems   Review of Systems  Constitutional:  Negative for fever.  Musculoskeletal:  Positive for myalgias. Negative for arthralgias and gait problem.    Physical Exam Updated Vital Signs BP (!) 150/80 (BP Location: Right Arm)   Pulse 79   Temp 98.9 F (37.2 C) (Oral)   Resp 20  SpO2 99%  Physical Exam Vitals and nursing note reviewed.  Constitutional:      Appearance: Normal appearance.  HENT:     Head: Normocephalic and atraumatic.     Mouth/Throat:     Mouth: Mucous membranes are moist.  Pulmonary:     Effort: Pulmonary effort is normal.  Abdominal:     General: Abdomen is flat.  Musculoskeletal:     Cervical back: Normal range of motion and neck supple.     Right knee: Swelling present. No erythema, ecchymosis or lacerations. Normal range of motion. Tenderness present over the patellar tendon.       Legs:     Comments: 2+ DP, PT pulses on RIGHT foot, no signs of injury such  as deformity, bruising or hematoma.    Skin:    General: Skin is warm and dry.  Neurological:     Mental Status: She is alert and oriented to person, place, and time.     ED Results / Procedures / Treatments   Labs (all labs ordered are listed, but only abnormal results are displayed) Labs Reviewed - No data to display  EKG None  Radiology DG Tibia/Fibula Right  Result Date: 03/30/2023 CLINICAL DATA:  Right knee and leg pain. Injury. EXAM: RIGHT TIBIA AND FIBULA - 2 VIEW COMPARISON:  None Available. FINDINGS: There is no evidence of fracture. Cortical margins of the tibia and fibula are intact. No erosions or periostitis. Ankle alignment is maintained. Soft tissues are unremarkable. IMPRESSION: Negative radiographs of the right lower leg. Electronically Signed   By: Narda Rutherford M.D.   On: 03/30/2023 18:29   DG Knee Complete 4 Views Right  Result Date: 03/30/2023 CLINICAL DATA:  Right knee and leg pain. Injury. EXAM: RIGHT KNEE - COMPLETE 4+ VIEW COMPARISON:  Knee radiograph 02/26/2018 FINDINGS: No fracture or dislocation. Mild tibiofemoral joint space narrowing is well as tricompartmental peripheral spurring. There is a subchondral cyst within the lateral tibial plateau. Small knee joint effusion. No erosive change, periostitis or focal bone abnormality. Mild generalized soft tissue edema. IMPRESSION: 1. No fracture or dislocation of the right knee. 2. Mild tricompartmental osteoarthritis and small joint effusion. Electronically Signed   By: Narda Rutherford M.D.   On: 03/30/2023 18:29    Procedures Procedures    Medications Ordered in ED Medications - No data to display  ED Course/ Medical Decision Making/ A&P                             Medical Decision Making Amount and/or Complexity of Data Reviewed Radiology: ordered.   Patient presents to the ED status post right leg injury, patient was taking care of her small [place] when suddenly charged her right leg,  this was approximately a week and a half ago.  She continues to endorse pain along the right shin.  She has taken some over-the-counter medication, has applied a pain patch, has also wrapped her right leg without any improvement in symptoms.  She is concerned for a small fracture and requesting imaging on today's visit.  Exam is benign, 2+ DP pulses, PT.  No signs of bruising, no signs of obvious deformity, no signs of hematomas.  X-Amato of the tib-fib, x-Feister of the right knee did not show any acute findings consistent with fracture or dislocation. We discussed symptomatic treatment at home, will continue RICE therapy.  Will follow-up with primary care physician as needed.  Portions of this  note were generated with Scientist, clinical (histocompatibility and immunogenetics). Dictation errors may occur despite best attempts at proofreading.   Final Clinical Impression(s) / ED Diagnoses Final diagnoses:  Right leg pain    Rx / DC Orders ED Discharge Orders     None         Claude Manges, PA-C 03/30/23 1838    Loetta Rough, MD 04/02/23 2156

## 2023-03-30 NOTE — Discharge Instructions (Addendum)
Your xray did not show any acute findings.  You may continue symptomatic treatment with over the counter medication.   Apply ice and elevate the right leg.

## 2023-04-04 ENCOUNTER — Other Ambulatory Visit: Payer: Self-pay | Admitting: Physical Medicine and Rehabilitation

## 2023-04-04 MED ORDER — OXYCODONE HCL 5 MG PO TABS
5.0000 mg | ORAL_TABLET | Freq: Three times a day (TID) | ORAL | 0 refills | Status: DC | PRN
  Filled 2023-04-04: qty 90, 30d supply, fill #0

## 2023-04-05 ENCOUNTER — Other Ambulatory Visit (HOSPITAL_BASED_OUTPATIENT_CLINIC_OR_DEPARTMENT_OTHER): Payer: Self-pay

## 2023-05-06 ENCOUNTER — Other Ambulatory Visit: Payer: Self-pay | Admitting: Physical Medicine and Rehabilitation

## 2023-05-06 ENCOUNTER — Other Ambulatory Visit (HOSPITAL_BASED_OUTPATIENT_CLINIC_OR_DEPARTMENT_OTHER): Payer: Self-pay

## 2023-05-06 ENCOUNTER — Other Ambulatory Visit: Payer: Self-pay

## 2023-05-06 MED ORDER — OXYCODONE HCL 5 MG PO TABS
5.0000 mg | ORAL_TABLET | Freq: Three times a day (TID) | ORAL | 0 refills | Status: DC | PRN
  Filled 2023-05-06: qty 90, 30d supply, fill #0

## 2023-06-05 ENCOUNTER — Other Ambulatory Visit: Payer: Self-pay | Admitting: Physical Medicine and Rehabilitation

## 2023-06-05 MED ORDER — OXYCODONE HCL 5 MG PO TABS
5.0000 mg | ORAL_TABLET | Freq: Three times a day (TID) | ORAL | 0 refills | Status: DC | PRN
  Filled 2023-06-05: qty 90, 30d supply, fill #0

## 2023-06-05 NOTE — Telephone Encounter (Signed)
Dr. Berline Chough is out of the office. If possible will you refill Ashley Shepherd pain medication. Thank you.   Filled  Written  ID  Drug  QTY  Days  Prescriber  RX #  Dispenser  Refill  Daily Dose*  Pymt Type  PMP  05/23/2023 03/26/2023 1  Gabapentin 300 Mg Capsule 120.00 30 Me Lov [id number] [facility] (2069) 2/5  Medicare [place] 05/06/2023 05/06/2023 2  Oxycodone Hcl (Ir) 5 Mg Tablet 90.00 30 Me Lov [id number] Con (5269) 0/0 22.50 MME Other [place] 04/25/2023 03/26/2023 1  Gabapentin 300 Mg Capsule 120.00 30 Me Lov [id number] [facility] (2069) 1/5  Medicare [place]

## 2023-06-06 ENCOUNTER — Other Ambulatory Visit (HOSPITAL_BASED_OUTPATIENT_CLINIC_OR_DEPARTMENT_OTHER): Payer: Self-pay

## 2023-06-14 ENCOUNTER — Encounter: Payer: Medicare Other | Admitting: Physical Medicine and Rehabilitation

## 2023-06-24 ENCOUNTER — Encounter: Payer: Self-pay | Admitting: Physical Medicine and Rehabilitation

## 2023-07-04 ENCOUNTER — Other Ambulatory Visit: Payer: Self-pay | Admitting: Physical Medicine & Rehabilitation

## 2023-07-05 ENCOUNTER — Telehealth: Payer: Self-pay | Admitting: Physical Medicine and Rehabilitation

## 2023-07-05 ENCOUNTER — Other Ambulatory Visit (HOSPITAL_BASED_OUTPATIENT_CLINIC_OR_DEPARTMENT_OTHER): Payer: Self-pay

## 2023-07-05 MED ORDER — OXYCODONE HCL 5 MG PO TABS
5.0000 mg | ORAL_TABLET | Freq: Three times a day (TID) | ORAL | 0 refills | Status: DC | PRN
  Filled 2023-07-05: qty 90, 30d supply, fill #0

## 2023-07-05 NOTE — Telephone Encounter (Signed)
Filled  Written  ID  Drug  QTY  Days  Prescriber  RX #  Dispenser  Refill  Daily Dose*  Pymt Type  PMP  06/17/2023 03/26/2023 1  Gabapentin 300 Mg Capsule 120.00 30 Me Lov [id number] [facility] (2069) 3/5  Medicare [place] 06/06/2023 06/05/2023 2  Oxycodone Hcl (Ir) 5 Mg Tablet 90.00 30 Yu Sht [id number] Con (5269) 0/0 22.50 MME Other [place]

## 2023-07-05 NOTE — Telephone Encounter (Signed)
The pt called and wanted to let us know she had sent in a refill request for her oxycodone 5 mg immediate release. She said they were telling her it needed authorization.

## 2023-07-09 ENCOUNTER — Other Ambulatory Visit (HOSPITAL_BASED_OUTPATIENT_CLINIC_OR_DEPARTMENT_OTHER): Payer: Self-pay

## 2023-07-09 NOTE — Telephone Encounter (Signed)
Patient picked up zero copay.

## 2023-07-15 ENCOUNTER — Encounter
Payer: Medicare Other | Attending: Physical Medicine and Rehabilitation | Admitting: Physical Medicine and Rehabilitation

## 2023-07-15 ENCOUNTER — Other Ambulatory Visit (HOSPITAL_BASED_OUTPATIENT_CLINIC_OR_DEPARTMENT_OTHER): Payer: Self-pay

## 2023-07-15 ENCOUNTER — Encounter: Payer: Self-pay | Admitting: Physical Medicine and Rehabilitation

## 2023-07-15 VITALS — BP 154/91 | HR 75 | Wt 196.0 lb

## 2023-07-15 DIAGNOSIS — M25551 Pain in right hip: Secondary | ICD-10-CM | POA: Diagnosis present

## 2023-07-15 DIAGNOSIS — M25461 Effusion, right knee: Secondary | ICD-10-CM | POA: Diagnosis present

## 2023-07-15 DIAGNOSIS — G894 Chronic pain syndrome: Secondary | ICD-10-CM | POA: Diagnosis present

## 2023-07-15 DIAGNOSIS — M545 Low back pain, unspecified: Secondary | ICD-10-CM | POA: Diagnosis present

## 2023-07-15 DIAGNOSIS — Z96641 Presence of right artificial hip joint: Secondary | ICD-10-CM | POA: Insufficient documentation

## 2023-07-15 DIAGNOSIS — M25561 Pain in right knee: Secondary | ICD-10-CM | POA: Insufficient documentation

## 2023-07-15 DIAGNOSIS — G8929 Other chronic pain: Secondary | ICD-10-CM | POA: Insufficient documentation

## 2023-07-15 DIAGNOSIS — Z5181 Encounter for therapeutic drug level monitoring: Secondary | ICD-10-CM | POA: Insufficient documentation

## 2023-07-15 MED ORDER — OXYCODONE HCL 5 MG PO TABS
5.0000 mg | ORAL_TABLET | Freq: Three times a day (TID) | ORAL | 0 refills | Status: DC | PRN
  Filled 2023-07-15 – 2023-08-04 (×2): qty 90, 30d supply, fill #0

## 2023-07-15 NOTE — Progress Notes (Signed)
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 Hx of anterior hip replacements on L and R hip replacement.  Had epidural for Hip pain. Also has nerve pain and myofascial pain.   Doesn't want trigger point injections again.   Didn't really help per pt- but not as bad.  Has neck pain sometimes- mainly when cloudy- hurts all over.     Called Ortho about knee pain- which is main pain. Hasn't heard back.   R knee is the worst. And R knee really swelling.    So busy with grandchildren and special needs daughter as well.  Sometimes taking Oxycodone 2 pills at a time because hurts so bad.   Exam: Awake, alert, appropriate, no AD, NAD R knee moderate effusion noted and suprapatellar swelling as well- notable when she stands.   Plan: No reason to inject right now for trigger point injections- since fixed the problem for now- will call me when/if wants to do again/if pain comes back.   2.  Dr Magnus Ivan at [facility]. [facility], but cannot send hima my chart secure chat through the system- he's permanently off line.    3. Don't want to increase Oxycodone- since goal was to stop it after hip surgery, but suggest speaking to Ortho about knee injections - but sent in Oxyocdone 3 tabs/day 5 mg- #90  4. F/U in 3 months since on pain meds   5  UDS is due today- needs to do.     I spent a total of  21  minutes on total care today- >50% coordination of care- due to  Decided against Trp injections- will call if needs in future- we discussed R knee pain- needs to have f/u with Ortho- is swollen- and pain meds

## 2023-07-15 NOTE — Patient Instructions (Signed)
  Plan: No reason to inject right now for trigger point injections- since fixed the problem for now- will call me when/if wants to do again/if pain comes back.   2.  Dr Magnus Ivan at [facility]. [facility], but cannot send hima my chart secure chat through the system- he's permanently off line.    3. Don't want to increase Oxycodone- since goal was to stop it after hip surgery, but suggest speaking to Ortho about knee injections - but sent in Oxyocdone 3 tabs/day 5 mg- #90  4. F/U in 3 months since on pain meds   5  UDS is due today- needs to do.

## 2023-07-18 LAB — TOXASSURE SELECT,+ANTIDEPR,UR

## 2023-07-23 ENCOUNTER — Other Ambulatory Visit: Payer: Self-pay | Admitting: Physical Medicine and Rehabilitation

## 2023-08-04 ENCOUNTER — Other Ambulatory Visit (HOSPITAL_BASED_OUTPATIENT_CLINIC_OR_DEPARTMENT_OTHER): Payer: Self-pay

## 2023-08-05 ENCOUNTER — Other Ambulatory Visit (HOSPITAL_BASED_OUTPATIENT_CLINIC_OR_DEPARTMENT_OTHER): Payer: Self-pay

## 2023-09-01 ENCOUNTER — Other Ambulatory Visit: Payer: Self-pay | Admitting: Physical Medicine and Rehabilitation

## 2023-09-03 ENCOUNTER — Other Ambulatory Visit: Payer: Self-pay | Admitting: Physical Medicine and Rehabilitation

## 2023-09-03 ENCOUNTER — Other Ambulatory Visit (HOSPITAL_BASED_OUTPATIENT_CLINIC_OR_DEPARTMENT_OTHER): Payer: Self-pay

## 2023-09-03 MED ORDER — OXYCODONE HCL 5 MG PO TABS
5.0000 mg | ORAL_TABLET | Freq: Three times a day (TID) | ORAL | 0 refills | Status: DC | PRN
  Filled 2023-09-03: qty 90, 30d supply, fill #0

## 2023-10-03 ENCOUNTER — Other Ambulatory Visit: Payer: Self-pay | Admitting: Physical Medicine and Rehabilitation

## 2023-10-04 ENCOUNTER — Other Ambulatory Visit (HOSPITAL_BASED_OUTPATIENT_CLINIC_OR_DEPARTMENT_OTHER): Payer: Self-pay

## 2023-10-04 ENCOUNTER — Telehealth: Payer: Self-pay | Admitting: Physical Medicine and Rehabilitation

## 2023-10-04 ENCOUNTER — Telehealth: Payer: Self-pay | Admitting: Specialist

## 2023-10-04 MED ORDER — OXYCODONE HCL 5 MG PO TABS: 5.0000 mg | ORAL_TABLET | Freq: Three times a day (TID) | ORAL | 0 refills | Status: DC | PRN

## 2023-10-04 NOTE — Telephone Encounter (Signed)
Patient needs oxycodone script refilled.  Patient of dr. Berline Chough.  Please refill. Shirlean Mylar, MHA, OT/L [phone]

## 2023-10-04 NOTE — Telephone Encounter (Signed)
Patient called requesting refill on Oxycodone. She is out.

## 2023-10-14 ENCOUNTER — Encounter
Payer: Medicare Other | Attending: Physical Medicine and Rehabilitation | Admitting: Physical Medicine and Rehabilitation

## 2023-10-14 ENCOUNTER — Encounter: Payer: Self-pay | Admitting: Physical Medicine and Rehabilitation

## 2023-10-14 VITALS — BP 139/81 | HR 74 | Ht 70.0 in | Wt 199.0 lb

## 2023-10-14 DIAGNOSIS — M545 Low back pain, unspecified: Secondary | ICD-10-CM | POA: Insufficient documentation

## 2023-10-14 DIAGNOSIS — G894 Chronic pain syndrome: Secondary | ICD-10-CM | POA: Insufficient documentation

## 2023-10-14 DIAGNOSIS — M792 Neuralgia and neuritis, unspecified: Secondary | ICD-10-CM | POA: Insufficient documentation

## 2023-10-14 DIAGNOSIS — M25561 Pain in right knee: Secondary | ICD-10-CM | POA: Diagnosis present

## 2023-10-14 DIAGNOSIS — M25461 Effusion, right knee: Secondary | ICD-10-CM | POA: Insufficient documentation

## 2023-10-14 DIAGNOSIS — Z96641 Presence of right artificial hip joint: Secondary | ICD-10-CM | POA: Diagnosis not present

## 2023-10-14 DIAGNOSIS — M7918 Myalgia, other site: Secondary | ICD-10-CM | POA: Insufficient documentation

## 2023-10-14 DIAGNOSIS — G8929 Other chronic pain: Secondary | ICD-10-CM | POA: Insufficient documentation

## 2023-10-14 MED ORDER — OXYCODONE HCL 5 MG PO TABS: 5.0000 mg | ORAL_TABLET | Freq: Three times a day (TID) | ORAL | 0 refills | Status: DC | PRN

## 2023-10-14 NOTE — Patient Instructions (Signed)
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 Hx of anterior hip replacements on L and R hip replacement.  Had epidural for Hip pain. Also has nerve pain and myofascial pain.   Will put on wait list- for trigger point injections  2. Doesn't need gabapentin yet- has refills  3. Will refill Oxycodone 5 mg 3x/day as needed- #90- and send in for 2 months supply-  1 month at  a time.    4. Explained will see Riley Lam, the NP for alternating appointments- her in  months and me in 4, however will also see for Trp injections myself.   5. Pain gets worse in winter- might benefit from increasing pain meds to 4x/day as needed for pain.    6. Pt doesn't want injections form anyone except Dr Berline Chough.    7.  Has refills On Tizanidine?Zanaflex- doesn's tneed refills today.    8. F/U 2 months with Riley Lam- wait list for trigger point injections. and 4months- with me-

## 2023-10-14 NOTE — Progress Notes (Signed)
Subjective:    Patient ID: Ashley Shepherd, female    DOB: 04-05-1956, 67 y.o.   MRN: [id number]  HPI   An audio/video tele-health visit is felt to be the most appropriate encounter for this patient at this time. This is a follow up tele-visit via phone. The patient is at home. MD is at office. Prior to scheduling this appointment, our staff discussed the limitations of evaluation and management by telemedicine and the availability of in-person appointments. The patient expressed understanding and agreed to proceed.  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 Hx of anterior hip replacements on L and R hip replacement.  Had epidural for Hip pain. Also has nerve pain and myofascial pain.    Called- didn't have transport, so cannot see in person today.   Wants to do injections in neck and top of shoulders/back is SO tight.  Didn't see Dr Magnus Ivan again or call him-  Putting pain patches on knee and not swollen anymore- not there "much"- anymore.   Using Voltaren and lidocaine patches and icy hot.  Still hurts but swelling is gone and walking better.  When takes meds and uses topical treatments, it helps.  Helps 40% when uses meds/topicals- pain not as bad.   Not limping like was.    Pain is getting worse with cold weather.   Has more refills on Zanaflex.    Pain Inventory Average Pain 7 Pain Right Now 7 My pain is constant, dull, and aching  In the last 24 hours, has pain interfered with the following? General activity 1 Relation with others 1 Enjoyment of life 1 What TIME of day is your pain at its worst? morning , daytime, evening, and night Sleep (in general) Good  Pain is worse with: walking, sitting, and inactivity Pain improves with: heat/ice and medication Relief from Meds: 5  Family History   Problem Relation Age of Onset   Hypertension Father    Diabetes Father    Heart attack Father    Hypertension Other    Diabetes Other    Social History   Socioeconomic History   Marital status: Widowed    Spouse name: Not on file   Number of children: Not on file   Years of education: Not on file   Highest education level: Not on file  Occupational History   Not on file  Tobacco Use   Smoking status: Every Day    Current packs/day: 0.50    Types: Cigarettes   Smokeless tobacco: Never  Vaping Use   Vaping status: Never Used  Substance and Sexual Activity   Alcohol use: No   Drug use: No   Sexual activity: Not on file  Other Topics Concern   Not on file  Social History Narrative   Not on file   Social Determinants of Health   Financial Resource Strain: Low Risk  (03/19/2023)   Received from [facility], [facility]   Overall Financial Resource Strain (CARDIA)    Difficulty of Paying Living Expenses: Not hard at all  Food Insecurity: Food Insecurity Present (04/30/2022)   Received from [facility], [facility]   Hunger Vital Sign    Worried About Running Out of Food in the Last Year: Sometimes true    Ran Out  of Food in the Last Year: Sometimes true  Transportation Needs: No Transportation Needs (03/19/2023)   Received from [facility], [facility]   [facility] - Transportation    Lack of Transportation (Medical): No    Lack of Transportation (Non-Medical): No  Physical Activity: Insufficiently Active (04/30/2022)   Received from [facility], [facility]   Exercise Vital Sign    Days of Exercise per Week: 5 days    Minutes of Exercise per Session: 10 min  Stress: Stress Concern Present (04/30/2022)   Received from [facility], [facility] - Occupational Stress Questionnaire    Feeling of Stress : Very much  Social Connections: Unknown (05/02/2023)   Received from [facility], [facility]   Social Network     Social Network: Not on file   Past Surgical History:  Procedure Laterality Date   ANTERIOR HIP REVISION Left 04/06/2016   Procedure: REMOVAL OF TEMPORARY ANTIBIOTIC SPACER LEFT HIP, LEFT TOTAL HIP REVISION;  Surgeon: Kathryne Hitch, MD;  Location: [facility];  Service: Orthopedics;  Laterality: Left;   ANTERIOR HIP REVISION Left 08/02/2017   Procedure: Irrigation and debridement left hip with antibiotic bead placement;  Surgeon: Kathryne Hitch, MD;  Location: [facility];  Service: Orthopedics;  Laterality: Left;   APPENDECTOMY     COLON SURGERY  2008   COLOSTOMY REVERSAL  2013   EXCISIONAL TOTAL HIP ARTHROPLASTY WITH ANTIBIOTIC SPACERS Left 01/24/2016   Procedure: EXCISION OF LEFT TOTAL HIP ARTHROPLASTY WITH PLACEMENT OF ANTIBIOTIC SPACERS;  Surgeon: Kathryne Hitch, MD;  Location: [facility];  Service: Orthopedics;  Laterality: Left;   INCISION AND DRAINAGE HIP Left 01/24/2016   Procedure: IRRIGATION AND DEBRIDEMENT LEFT HIP;  Surgeon: Kathryne Hitch, MD;  Location: [facility] OR;  Service: Orthopedics;  Laterality: Left;   KNEE ARTHROSCOPY     left   PORT-A-CATH REMOVAL     TOTAL HIP ARTHROPLASTY Left 10/01/2014   Procedure: LEFT TOTAL HIP ARTHROPLASTY ANTERIOR APPROACH;  Surgeon: Kathryne Hitch, MD;  Location: [facility];  Service: Orthopedics;  Laterality: Left;   TOTAL HIP ARTHROPLASTY Right 07/14/2021   Procedure: RIGHT TOTAL HIP ARTHROPLASTY ANTERIOR APPROACH;  Surgeon: Kathryne Hitch, MD;  Location: [facility];  Service: Orthopedics;  Laterality: Right;   Past Surgical History:  Procedure Laterality Date   ANTERIOR HIP REVISION Left 04/06/2016   Procedure: REMOVAL OF TEMPORARY ANTIBIOTIC SPACER LEFT HIP, LEFT TOTAL HIP REVISION;  Surgeon: Kathryne Hitch, MD;  Location: [facility];  Service: Orthopedics;  Laterality: Left;   ANTERIOR HIP REVISION Left 08/02/2017   Procedure: Irrigation and debridement left hip with antibiotic bead placement;  Surgeon: Kathryne Hitch, MD;  Location: [facility];  Service: Orthopedics;  Laterality: Left;   APPENDECTOMY     COLON SURGERY  2008   COLOSTOMY REVERSAL  2013   EXCISIONAL TOTAL HIP ARTHROPLASTY WITH ANTIBIOTIC SPACERS Left 01/24/2016   Procedure: EXCISION OF LEFT TOTAL HIP ARTHROPLASTY WITH PLACEMENT OF ANTIBIOTIC SPACERS;  Surgeon: Kathryne Hitch, MD;  Location: [facility];  Service: Orthopedics;  Laterality: Left;   INCISION AND DRAINAGE HIP Left 01/24/2016   Procedure: IRRIGATION AND DEBRIDEMENT LEFT HIP;  Surgeon: Kathryne Hitch, MD;  Location: [facility] OR;  Service: Orthopedics;  Laterality: Left;   KNEE ARTHROSCOPY     left   PORT-A-CATH REMOVAL     TOTAL HIP ARTHROPLASTY Left 10/01/2014   Procedure: LEFT TOTAL HIP ARTHROPLASTY ANTERIOR APPROACH;  Surgeon: Vanita Panda  Magnus Ivan, MD;  Location: [facility];  Service: Orthopedics;  Laterality: Left;   TOTAL HIP ARTHROPLASTY Right 07/14/2021   Procedure: RIGHT TOTAL HIP ARTHROPLASTY ANTERIOR APPROACH;  Surgeon: Kathryne Hitch, MD;  Location: [facility];  Service: Orthopedics;  Laterality: Right;   Past Medical History:  Diagnosis Date   Anemia    Anxiety    Arthritis    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HCC)    left   Cancer (HCC)    7 years ago, cancer free now   Depression    Difficulty sleeping    GERD (gastroesophageal reflux disease)    History of kidney stones    History of transfusion    HIV disease (HCC)    Kidney stones    Pneumonia    There were no vitals taken for this visit.  Opioid Risk Score:   Fall Risk Score:  `1  Depression screen [facility] 2/9     07/15/2023   11:58 AM 03/11/2023    1:01 PM 10/08/2022   12:05 PM 07/06/2022    3:32 PM 11/10/2021    2:21 PM 01/04/2021    9:22 AM 06/03/2020    9:55 AM  Depression screen PHQ 2/9  Decreased Interest 0 0 0 0 0 0 0  Down, Depressed, Hopeless 0 0 0 0 0 0 0  PHQ - 2 Score 0 0 0 0 0 0 0     Review of Systems  Constitutional: Negative.   HENT: Negative.    Eyes: Negative.    Respiratory: Negative.    Cardiovascular: Negative.   Gastrointestinal: Negative.   Endocrine: Negative.   Genitourinary: Negative.   Musculoskeletal:  Positive for arthralgias and back pain.       Legs and feet/ spasms  Skin: Negative.   Allergic/Immunologic: Negative.   Neurological:        Tingling.  Hematological: Negative.   Psychiatric/Behavioral: Negative.    All other systems reviewed and are negative.      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 Hx of anterior hip replacements on L and R hip replacement.  Had epidural for Hip pain. Also has nerve pain and myofascial pain.   Will put on wait list- for trigger point injections  2. Doesn't need gabapentin yet- has refills  3. Will refill Oxycodone 5 mg 3x/day as needed- #90- and send in for 2 months supply-  1 month at  a time.    4. Explained will see Riley Lam, the NP for alternating appointments- her in  months and me in 4, however will also see for Trp injections myself.   5. Pain gets worse in winter- might benefit from increasing pain meds to 4x/day as needed for pain.    6. Pt doesn't want injections form anyone except Dr Berline Chough.    7.  Has refills On Tizanidine?Zanaflex- doesn's tneed refills today.    8. F/U 2 months with Riley Lam- wait list for trigger point injections. and 4months- with me-    I spent a total of  20  minutes on total care today- >50% coordination of care- due to discussing options for pain control and TrP injections  and how we are changing things up to see Riley Lam -someone every 2 months.

## 2023-12-10 ENCOUNTER — Encounter: Payer: Medicare Other | Admitting: Registered Nurse

## 2023-12-18 ENCOUNTER — Other Ambulatory Visit: Payer: Self-pay | Admitting: Physical Medicine and Rehabilitation

## 2023-12-19 ENCOUNTER — Telehealth: Payer: Self-pay | Admitting: Registered Nurse

## 2023-12-19 NOTE — Telephone Encounter (Signed)
Pharmacy will not fill gabapentin. Please advised

## 2023-12-20 NOTE — Telephone Encounter (Signed)
I spoke with the pharmacy and she can get it filled on 12/22/23. I have let her know.

## 2023-12-21 ENCOUNTER — Encounter: Payer: Self-pay | Admitting: Physical Medicine and Rehabilitation

## 2023-12-23 MED ORDER — GABAPENTIN 300 MG PO CAPS: 600.0000 mg | ORAL_CAPSULE | Freq: Two times a day (BID) | ORAL | 3 refills | Status: DC

## 2023-12-24 NOTE — Progress Notes (Signed)
 Subjective:    Patient ID: Ashley Shepherd, female    DOB: 1956/06/25, 67 y.o.   MRN: [id number]  HPI: Ashley Shepherd is a 67 y.o. female who returns for follow up appointment for chronic pain and medication refill. She states her  pain is located in her bilateral shoulders, lower back pain, bilateral knee pain and generalized joint pain. Ashley Shepherd reports two hours of relief of her pain with current medication regimen. She  rates her  pain 10. current exercise regime is walking and performing stretching exercises.  Ashley Shepherd Morphine  equivalent is 22.50 MME.   UDS ordered today.    Ashley Shepherd reports she has a cold, she will b calling her PCP for F/U appointment, she was  encouraged to F/U with PCP, she states if she is unable to obtain an appointment she will go to Urgent Care.    Pain Inventory Average Pain 9 Pain Right Now 10 My pain is constant, sharp, burning, tingling, and aching  In the last 24 hours, has pain interfered with the following? General activity 6 Relation with others 2 Enjoyment of life 5 What TIME of day is your pain at its worst? night Sleep (in general) Fair  Pain is worse with: walking, bending, sitting, inactivity, standing, and some activites Pain improves with: heat/ice and medication Relief from Meds: 7  Family History  Problem Relation Age of Onset   Hypertension Father    Diabetes Father    Heart attack Father    Hypertension Other    Diabetes Other    Social History   Socioeconomic History   Marital status: Widowed    Spouse name: Not on file   Number of children: Not on file   Years of education: Not on file   Highest education level: Not on file  Occupational History   Not on file  Tobacco Use   Smoking status: Every Day    Current packs/day: 0.50    Types: Cigarettes   Smokeless tobacco: Never  Vaping Use   Vaping status: Never Used  Substance and Sexual Activity   Alcohol use: No   Drug use: No   Sexual activity: Not on file  Other  Topics Concern   Not on file  Social History Narrative   Not on file   Social Drivers of Health   Financial Resource Strain: Low Risk  (03/19/2023)   Received from [facility]   Overall Financial Resource Strain (CARDIA)    Difficulty of Paying Living Expenses: Not hard at all  Food Insecurity: Food Insecurity Present (04/30/2022)   Received from [facility], [facility]   Hunger Vital Sign    Worried About Running Out of Food in the Last Year: Sometimes true    Ran Out of Food in the Last Year: Sometimes true  Transportation Needs: No Transportation Needs (03/19/2023)   Received from [facility], [facility]   PRAPARE - Transportation    Lack of Transportation (Medical): No    Lack of Transportation (Non-Medical): No  Physical Activity: Insufficiently Active (04/30/2022)   Received from [facility], [facility]   Exercise Vital Sign    Days of Exercise per Week: 5 days    Minutes of Exercise per Session: 10 min  Stress: Stress Concern Present (04/30/2022)   Received from [facility], [facility] of Occupational Health - Occupational Stress Questionnaire    Feeling of Stress : Very Shepherd  Social Connections: Unknown (05/02/2023)  Received from [facility], [facility]   Social Network    Social Network: Not on file   Past Surgical History:  Procedure Laterality Date   ANTERIOR HIP REVISION Left 04/06/2016   Procedure: REMOVAL OF TEMPORARY ANTIBIOTIC SPACER LEFT HIP, LEFT TOTAL HIP REVISION;  Surgeon: Lonni CINDERELLA Poli, MD;  [facility]: [facility];  Service: Orthopedics;  Laterality: Left;   ANTERIOR HIP REVISION Left 08/02/2017   Procedure: Irrigation and debridement left hip with antibiotic bead placement;  Surgeon: Poli Lonni CINDERELLA, MD;  [facility];  Service: Orthopedics;  Laterality: Left;   APPENDECTOMY     COLON SURGERY  2008   COLOSTOMY REVERSAL  2013   EXCISIONAL TOTAL HIP ARTHROPLASTY WITH ANTIBIOTIC SPACERS  Left 01/24/2016   Procedure: EXCISION OF LEFT TOTAL HIP ARTHROPLASTY WITH PLACEMENT OF ANTIBIOTIC SPACERS;  Surgeon: Lonni CINDERELLA Poli, MD;  [facility]: [facility];  Service: Orthopedics;  Laterality: Left;   INCISION AND DRAINAGE HIP Left 01/24/2016   Procedure: IRRIGATION AND DEBRIDEMENT LEFT HIP;  Surgeon: Lonni CINDERELLA Poli, MD;  Location: [facility] OR;  Service: Orthopedics;  Laterality: Left;   KNEE ARTHROSCOPY     left   PORT-A-CATH REMOVAL     TOTAL HIP ARTHROPLASTY Left 10/01/2014   Procedure: LEFT TOTAL HIP ARTHROPLASTY ANTERIOR APPROACH;  Surgeon: Lonni CINDERELLA Poli, MD;  [facility];  Service: Orthopedics;  Laterality: Left;   TOTAL HIP ARTHROPLASTY Right 07/14/2021   Procedure: RIGHT TOTAL HIP ARTHROPLASTY ANTERIOR APPROACH;  Surgeon: Poli Lonni CINDERELLA, MD;  [facility];  Service: Orthopedics;  Laterality: Right;   Past Surgical History:  Procedure Laterality Date   ANTERIOR HIP REVISION Left 04/06/2016   Procedure: REMOVAL OF TEMPORARY ANTIBIOTIC SPACER LEFT HIP, LEFT TOTAL HIP REVISION;  Surgeon: Lonni CINDERELLA Poli, MD;  [facility];  Service: Orthopedics;  Laterality: Left;   ANTERIOR HIP REVISION Left 08/02/2017   Procedure: Irrigation and debridement left hip with antibiotic bead placement;  Surgeon: Poli Lonni CINDERELLA, MD;  [facility];  Service: Orthopedics;  Laterality: Left;   APPENDECTOMY     COLON SURGERY  2008   COLOSTOMY REVERSAL  2013   EXCISIONAL TOTAL HIP ARTHROPLASTY WITH ANTIBIOTIC SPACERS Left 01/24/2016   Procedure: EXCISION OF LEFT TOTAL HIP ARTHROPLASTY WITH PLACEMENT OF ANTIBIOTIC SPACERS;  Surgeon: Lonni CINDERELLA Poli, MD;  [facility];  Service: Orthopedics;  Laterality: Left;   INCISION AND DRAINAGE HIP Left 01/24/2016   Procedure: IRRIGATION AND DEBRIDEMENT LEFT HIP;  Surgeon: Lonni CINDERELLA Poli, MD;  Location: [facility] OR;  Service: Orthopedics;  Laterality: Left;   KNEE ARTHROSCOPY     left   PORT-A-CATH REMOVAL      TOTAL HIP ARTHROPLASTY Left 10/01/2014   Procedure: LEFT TOTAL HIP ARTHROPLASTY ANTERIOR APPROACH;  Surgeon: Lonni CINDERELLA Poli, MD;  [facility];  Service: Orthopedics;  Laterality: Left;   TOTAL HIP ARTHROPLASTY Right 07/14/2021   Procedure: RIGHT TOTAL HIP ARTHROPLASTY ANTERIOR APPROACH;  Surgeon: Poli Lonni CINDERELLA, MD;  [facility];  Service: Orthopedics;  Laterality: Right;   Past Medical History:  Diagnosis Date   Anemia    Anxiety    Arthritis    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HCC)    left   Cancer (HCC)    7 years ago, cancer free now   Depression    Difficulty sleeping    GERD (gastroesophageal reflux disease)    History of kidney stones    History of transfusion    HIV disease (HCC)  Kidney stones    Pneumonia    BP (!) 155/96   Pulse 75   Ht 5' 10 (1.778 m)   Wt 191 lb (86.6 kg)   SpO2 100%   BMI 27.41 kg/m   Opioid Risk Score:   Fall Risk Score:  `1  Depression screen [facility] 2/9     12/26/2023    9:53 AM 07/15/2023   11:58 AM 03/11/2023    1:01 PM 10/08/2022   12:05 PM 07/06/2022    3:32 PM 11/10/2021    2:21 PM 01/04/2021    9:22 AM  Depression screen PHQ 2/9  Decreased Interest 1 0 0 0 0 0 0  Down, Depressed, Hopeless 1 0 0 0 0 0 0  PHQ - 2 Score 2 0 0 0 0 0 0    Review of Systems  Musculoskeletal:  Positive for back pain.       Pain in; both shoulders, both knees, both hands  All other systems reviewed and are negative.      Objective:   Physical Exam Vitals and nursing note reviewed.  Constitutional:      Appearance: Normal appearance.  Cardiovascular:     Rate and Rhythm: Normal rate and regular rhythm.  Pulmonary:     Breath sounds: Wheezing present.  Musculoskeletal:     Comments: Normal Muscle Bulk and Muscle Testing Reveals:  Upper Extremities: Full ROM and Muscle Strength 5/5 Bilateral AC Joint Tenderness Lumbar Paraspinal Tenderness: L-4-L-5 Lower Extremities: Decreased ROM and Muscle Strength 5/5 Bilateral Lower  Extremities Flexion Produces Pain into her Bilateral Patella's Arises from table slowly Antalgic  Gait     Skin:    General: Skin is warm and dry.  Neurological:     Mental Status: She is alert and oriented to person, place, and time.  Psychiatric:        Mood and Affect: Mood normal.        Behavior: Behavior normal.         Assessment & Plan:  Chronic Bilateral Low Back Pain without sciatica: Continue HEP as Tolerated.  Chronic Bilateral Shoulder Pain: Continue HEP as Tolerated. Continue to Monitor.  Bilateral Chronic Knee Pain: Continue HEP as Tolerated. Continue to Monitor.  Myofascial pain: She has a scheduled appointment with Dr Lovorn for Trigger Point Injection. Continue to Monitor.  4. Polyarthralgia : Continue HEP as Tolerated. Continue to Monitor.  5, Chronic Pain Syndrome: Refilled: Increased :Oxycodone  5 mg one tablet every 6 hours as needed for pain. #110. We will continue the opioid monitoring program, this consists of regular clinic visits, examinations, urine drug screen, pill counts as well as use of [place]  Controlled Substance Reporting system. A 12 month History has been reviewed on the [place]  Controlled Substance Reporting System on 12/26/2023   F/U with Dr Lovorn

## 2023-12-26 ENCOUNTER — Encounter: Payer: Medicare Other | Admitting: Registered Nurse

## 2023-12-26 ENCOUNTER — Encounter: Payer: Self-pay | Admitting: Registered Nurse

## 2023-12-26 ENCOUNTER — Encounter: Payer: Medicare Other | Attending: Physical Medicine and Rehabilitation | Admitting: Registered Nurse

## 2023-12-26 ENCOUNTER — Other Ambulatory Visit: Payer: Self-pay | Admitting: Registered Nurse

## 2023-12-26 VITALS — BP 155/85 | HR 77 | Ht 70.0 in | Wt 191.0 lb

## 2023-12-26 DIAGNOSIS — Z5181 Encounter for therapeutic drug level monitoring: Secondary | ICD-10-CM | POA: Diagnosis not present

## 2023-12-26 DIAGNOSIS — M25511 Pain in right shoulder: Secondary | ICD-10-CM | POA: Diagnosis present

## 2023-12-26 DIAGNOSIS — M2550 Pain in unspecified joint: Secondary | ICD-10-CM | POA: Diagnosis present

## 2023-12-26 DIAGNOSIS — M545 Low back pain, unspecified: Secondary | ICD-10-CM | POA: Insufficient documentation

## 2023-12-26 DIAGNOSIS — G894 Chronic pain syndrome: Secondary | ICD-10-CM | POA: Insufficient documentation

## 2023-12-26 DIAGNOSIS — M7918 Myalgia, other site: Secondary | ICD-10-CM | POA: Diagnosis present

## 2023-12-26 DIAGNOSIS — Z79891 Long term (current) use of opiate analgesic: Secondary | ICD-10-CM | POA: Insufficient documentation

## 2023-12-26 DIAGNOSIS — G8929 Other chronic pain: Secondary | ICD-10-CM | POA: Insufficient documentation

## 2023-12-26 DIAGNOSIS — M25512 Pain in left shoulder: Secondary | ICD-10-CM | POA: Diagnosis present

## 2023-12-26 DIAGNOSIS — M25562 Pain in left knee: Secondary | ICD-10-CM | POA: Diagnosis present

## 2023-12-26 DIAGNOSIS — M25561 Pain in right knee: Secondary | ICD-10-CM | POA: Insufficient documentation

## 2023-12-26 MED ORDER — OXYCODONE HCL 5 MG PO TABS: 5.0000 mg | ORAL_TABLET | Freq: Four times a day (QID) | ORAL | 0 refills | Status: DC | PRN

## 2023-12-27 ENCOUNTER — Encounter: Payer: Medicare Other | Admitting: Physical Medicine and Rehabilitation

## 2023-12-30 LAB — TOXASSURE SELECT,+ANTIDEPR,UR

## 2024-01-21 ENCOUNTER — Other Ambulatory Visit: Payer: Self-pay | Admitting: Physical Medicine and Rehabilitation

## 2024-01-22 ENCOUNTER — Encounter: Payer: Self-pay | Admitting: Physical Medicine and Rehabilitation

## 2024-01-23 ENCOUNTER — Telehealth: Payer: Self-pay | Admitting: Registered Nurse

## 2024-01-23 MED ORDER — TIZANIDINE HCL 4 MG PO TABS: ORAL_TABLET | ORAL | 5 refills | Status: DC

## 2024-01-23 NOTE — Telephone Encounter (Signed)
Call Placed to Ashley Shepherd regarding her Tizanidine, sent to pharmacy. She verbalizes understanding.

## 2024-01-23 NOTE — Telephone Encounter (Signed)
Pt called asking for a refill on Tizanidine. She was seen by Riley Lam on 12/26/23.

## 2024-01-23 NOTE — Telephone Encounter (Signed)
Pt called to see if she can get a refill on her medication. She was seen by Riley Lam on 12/26/2023 and said that the rx has not yet been sent.

## 2024-01-24 ENCOUNTER — Other Ambulatory Visit: Payer: Self-pay | Admitting: Physical Medicine and Rehabilitation

## 2024-01-24 MED ORDER — TIZANIDINE HCL 4 MG PO TABS: ORAL_TABLET | ORAL | 5 refills | Status: DC

## 2024-01-27 ENCOUNTER — Telehealth: Payer: Self-pay | Admitting: Physical Medicine and Rehabilitation

## 2024-01-27 NOTE — Telephone Encounter (Signed)
Patient called requesting refill on Oxycodone  

## 2024-01-28 ENCOUNTER — Other Ambulatory Visit: Payer: Self-pay | Admitting: Physical Medicine and Rehabilitation

## 2024-01-28 MED ORDER — OXYCODONE HCL 5 MG PO TABS: 5.0000 mg | ORAL_TABLET | Freq: Four times a day (QID) | ORAL | 0 refills | Status: DC | PRN

## 2024-01-31 MED ORDER — OXYCODONE HCL 5 MG PO TABS: 5.0000 mg | ORAL_TABLET | ORAL | 0 refills | Status: DC | PRN

## 2024-01-31 NOTE — Addendum Note (Signed)
 Addended by: Alec Mcphee on: 01/31/2024 07:10 AM   Modules accepted: Orders

## 2024-02-03 ENCOUNTER — Encounter: Payer: Medicare Other | Admitting: Physical Medicine and Rehabilitation

## 2024-02-14 ENCOUNTER — Ambulatory Visit: Payer: Medicare Other | Admitting: Physical Medicine and Rehabilitation

## 2024-04-01 ENCOUNTER — Encounter: Payer: Medicare Other | Admitting: Physical Medicine and Rehabilitation

## 2024-04-13 ENCOUNTER — Other Ambulatory Visit: Payer: Self-pay | Admitting: Physical Medicine and Rehabilitation

## 2024-04-14 ENCOUNTER — Other Ambulatory Visit: Payer: Self-pay | Admitting: Physical Medicine and Rehabilitation

## 2024-04-14 ENCOUNTER — Encounter: Payer: Self-pay | Admitting: Physical Medicine and Rehabilitation

## 2024-04-14 MED ORDER — OXYCODONE HCL 5 MG PO TABS: 5.0000 mg | ORAL_TABLET | Freq: Four times a day (QID) | ORAL | 0 refills | Status: DC | PRN

## 2024-04-15 MED ORDER — OXYCODONE HCL 5 MG PO TABS: 5.0000 mg | ORAL_TABLET | ORAL | 0 refills | Status: DC | PRN

## 2024-05-14 NOTE — Progress Notes (Signed)
 Subjective:    Patient ID: Ashley Shepherd, female    DOB: 01/01/1956, 68 y.o.   MRN: [id number]  HPI: Ashley Shepherd is a 68 y.o. female who returns for follow up appointment for chronic pain and medication refill. She states her pain is located in her bilateral shoulders, mid- lower back and bilateral feet with tingling and burning. She  rates her pain 6. Her current exercise regime is walking and performing stretching exercises.  Ms. Sandford Morphine  equivalent is 30.56 MME.   UDS ordered today.     Pain Inventory Average Pain 6 Pain Right Now 6 My pain is sharp  In the last 24 hours, has pain interfered with the following? General activity 7 Relation with others 6 Enjoyment of life 7 What TIME of day is your pain at its worst? morning  and night Sleep (in general) Fair  Pain is worse with: walking, bending, inactivity, and standing Pain improves with: medication Relief from Meds: 7  Family History  Problem Relation Age of Onset   Hypertension Father    Diabetes Father    Heart attack Father    Hypertension Other    Diabetes Other    Social History   Socioeconomic History   Marital status: Widowed    Spouse name: Not on file   Number of children: Not on file   Years of education: Not on file   Highest education level: Not on file  Occupational History   Not on file  Tobacco Use   Smoking status: Every Day    Current packs/day: 0.50    Types: Cigarettes   Smokeless tobacco: Never  Vaping Use   Vaping status: Never Used  Substance and Sexual Activity   Alcohol use: No   Drug use: No   Sexual activity: Not on file  Other Topics Concern   Not on file  Social History Narrative   Not on file   Social Drivers of Health   Financial Resource Strain: Low Risk  (04/08/2024)   Received from [facility]   Overall Financial Resource Strain (CARDIA)    Difficulty of Paying Living Expenses: Not hard at all  Food Insecurity: No Food Insecurity (04/08/2024)   Received from  [facility]   Hunger Vital Sign    Worried About Running Out of Food in the Last Year: Never true    Ran Out of Food in the Last Year: Never true  Transportation Needs: Unmet Transportation Needs (04/08/2024)   Received from [facility]   PRAPARE - Transportation    Lack of Transportation (Medical): Yes    Lack of Transportation (Non-Medical): No  Physical Activity: Insufficiently Active (04/08/2024)   Received from [facility]   Exercise Vital Sign    Days of Exercise per Week: 3 days    Minutes of Exercise per Session: 20 min  Stress: Stress Concern Present (04/08/2024)   Received from [facility] of Occupational Health - Occupational Stress Questionnaire    Feeling of Stress : To some extent  Social Connections: Socially Integrated (04/08/2024)   Received from [facility]   Social Network    How would you rate your social network (family, work, friends)?: Good participation with social networks   Past Surgical History:  Procedure Laterality Date   ANTERIOR HIP REVISION Left 04/06/2016   Procedure: REMOVAL OF TEMPORARY ANTIBIOTIC SPACER LEFT HIP, LEFT TOTAL HIP REVISION;  Surgeon: Arnie Lao, MD;  [facility];  Service: Orthopedics;  Laterality: Left;   ANTERIOR HIP REVISION Left 08/02/2017   Procedure: Irrigation and debridement left hip with antibiotic bead placement;  Surgeon: Arnie Lao, MD;  [facility];  Service: Orthopedics;  Laterality: Left;   APPENDECTOMY     COLON SURGERY  2008   COLOSTOMY REVERSAL  2013   EXCISIONAL TOTAL HIP ARTHROPLASTY WITH ANTIBIOTIC SPACERS Left 01/24/2016   Procedure: EXCISION OF LEFT TOTAL HIP ARTHROPLASTY WITH PLACEMENT OF ANTIBIOTIC SPACERS;  Surgeon: Arnie Lao, MD;  Location: [facility];  Service: Orthopedics;  Laterality: Left;   INCISION AND DRAINAGE HIP Left 01/24/2016   Procedure: IRRIGATION AND DEBRIDEMENT LEFT HIP;  Surgeon: Arnie Lao, MD;  Location: [facility] OR;   Service: Orthopedics;  Laterality: Left;   KNEE ARTHROSCOPY     left   PORT-A-CATH REMOVAL     TOTAL HIP ARTHROPLASTY Left 10/01/2014   Procedure: LEFT TOTAL HIP ARTHROPLASTY ANTERIOR APPROACH;  Surgeon: Arnie Lao, MD;  [facility];  Service: Orthopedics;  Laterality: Left;   TOTAL HIP ARTHROPLASTY Right 07/14/2021   Procedure: RIGHT TOTAL HIP ARTHROPLASTY ANTERIOR APPROACH;  Surgeon: Arnie Lao, MD;  [facility];  Service: Orthopedics;  Laterality: Right;   Past Surgical History:  Procedure Laterality Date   ANTERIOR HIP REVISION Left 04/06/2016   Procedure: REMOVAL OF TEMPORARY ANTIBIOTIC SPACER LEFT HIP, LEFT TOTAL HIP REVISION;  Surgeon: Arnie Lao, MD;  [facility];  Service: Orthopedics;  Laterality: Left;   ANTERIOR HIP REVISION Left 08/02/2017   Procedure: Irrigation and debridement left hip with antibiotic bead placement;  Surgeon: Arnie Lao, MD;  [facility];  Service: Orthopedics;  Laterality: Left;   APPENDECTOMY     COLON SURGERY  2008   COLOSTOMY REVERSAL  2013   EXCISIONAL TOTAL HIP ARTHROPLASTY WITH ANTIBIOTIC SPACERS Left 01/24/2016   Procedure: EXCISION OF LEFT TOTAL HIP ARTHROPLASTY WITH PLACEMENT OF ANTIBIOTIC SPACERS;  Surgeon: Arnie Lao, MD;  Location: [facility];  Service: Orthopedics;  Laterality: Left;   INCISION AND DRAINAGE HIP Left 01/24/2016   Procedure: IRRIGATION AND DEBRIDEMENT LEFT HIP;  Surgeon: Arnie Lao, MD;  Location: [facility] OR;  Service: Orthopedics;  Laterality: Left;   KNEE ARTHROSCOPY     left   PORT-A-CATH REMOVAL     TOTAL HIP ARTHROPLASTY Left 10/01/2014   Procedure: LEFT TOTAL HIP ARTHROPLASTY ANTERIOR APPROACH;  Surgeon: Arnie Lao, MD;  [facility];  Service: Orthopedics;  Laterality: Left;   TOTAL HIP ARTHROPLASTY Right 07/14/2021   Procedure: RIGHT TOTAL HIP ARTHROPLASTY ANTERIOR APPROACH;  Surgeon: Arnie Lao, MD;  [facility];  Service: Orthopedics;  Laterality: Right;   Past Medical History:  Diagnosis Date   Anemia    Anxiety    Arthritis    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HCC)    left   Cancer (HCC)    7 years ago, cancer free now   Depression    Difficulty sleeping    GERD (gastroesophageal reflux disease)    History of kidney stones    History of transfusion    HIV disease (HCC)    Kidney stones    Pneumonia    BP (!) 144/84   Pulse 83   Ht 5\' 10"  (1.778 m)   Wt 194 lb (88 kg)   SpO2 99%   BMI 27.84 kg/m   Opioid Risk Score:   Fall Risk Score:  `1  Depression screen [facility] 2/9     05/15/2024  1:13 PM 12/26/2023    9:53 AM 07/15/2023   11:58 AM 03/11/2023    1:01 PM 10/08/2022   12:05 PM 07/06/2022    3:32 PM 11/10/2021    2:21 PM  Depression screen PHQ 2/9  Decreased Interest 1 1 0 0 0 0 0  Down, Depressed, Hopeless 1 1 0 0 0 0 0  PHQ - 2 Score 2 2 0 0 0 0 0     Review of Systems  Musculoskeletal:  Positive for back pain.       Bila shoulder and knees and hands  All other systems reviewed and are negative.      Objective:   Physical Exam Vitals and nursing note reviewed.  Constitutional:      Appearance: Normal appearance.  Cardiovascular:     Rate and Rhythm: Normal rate and regular rhythm.     Pulses: Normal pulses.     Heart sounds: Normal heart sounds.  Pulmonary:     Effort: Pulmonary effort is normal.     Breath sounds: Normal breath sounds.  Musculoskeletal:     Comments: Normal Muscle Bulk and Muscle Testing Reveals:  Upper Extremities: Full ROM and Muscle Strength 5/5 Bilateral AC Joint Tenderness Lumbar Hypersensitivity Lower Extremities: Decreased ROM and Muscle Strength 5/5 Bilateral Lower Extremities Flexion Produces Pain into her bilateral Patella's and Bilateral Feet with tingling and burning Arises from Table slowly  Antalgic  Gait     Skin:    General: Skin is warm and dry.  Neurological:     Mental Status: She is alert and oriented to person,  place, and time.  Psychiatric:        Mood and Affect: Mood normal.        Behavior: Behavior normal.         Assessment & Plan:  Chronic Bilateral Thoracic Back Pain: Continue HEP as Tolerated. Continue to monitor. 05/15/2024 Chronic Low Back Pain without sciatica: Continue HEP as Tolerated. 05/15/2024 Chronic Bilateral Shoulder Pain: Continue HEP as Tolerated. Continue to Monitor. 05/15/2024 Bilateral Chronic Knee Pain: Continue HEP as Tolerated. Continue to Monitor. 05/15/2024 Myofascial pain: Continue Tizanidine . She has a scheduled appointment with Dr Lovorn for Trigger Point Injection. Continue to Monitor.  4. Polyarthralgia : Continue HEP as Tolerated. Continue to Monitor. 05/15/2024 5, Chronic Pain Syndrome: Refilled: :Oxycodone  5 mg one tablet every 6 hours as needed for pain. #120. We will continue the opioid monitoring program, this consists of regular clinic visits, examinations, urine drug screen, pill counts as well as use of [place]  Controlled Substance Reporting system. A 12 month History has been reviewed on the [place]  Controlled Substance Reporting System on 05/15/2024   6. Bilateral Feet with Neuropathic Pain: Continue Gabapentin . Continue to Monitor.  F/U with Dr Lovorn

## 2024-05-15 ENCOUNTER — Encounter: Payer: Self-pay | Admitting: Registered Nurse

## 2024-05-15 ENCOUNTER — Other Ambulatory Visit: Payer: Self-pay | Admitting: Registered Nurse

## 2024-05-15 ENCOUNTER — Encounter: Attending: Registered Nurse | Admitting: Registered Nurse

## 2024-05-15 ENCOUNTER — Ambulatory Visit: Admitting: Registered Nurse

## 2024-05-15 VITALS — BP 120/77 | HR 83 | Ht 70.0 in | Wt 194.0 lb

## 2024-05-15 DIAGNOSIS — G894 Chronic pain syndrome: Secondary | ICD-10-CM | POA: Diagnosis present

## 2024-05-15 DIAGNOSIS — M25562 Pain in left knee: Secondary | ICD-10-CM | POA: Diagnosis present

## 2024-05-15 DIAGNOSIS — M546 Pain in thoracic spine: Secondary | ICD-10-CM | POA: Insufficient documentation

## 2024-05-15 DIAGNOSIS — Z79891 Long term (current) use of opiate analgesic: Secondary | ICD-10-CM | POA: Diagnosis not present

## 2024-05-15 DIAGNOSIS — Z5181 Encounter for therapeutic drug level monitoring: Secondary | ICD-10-CM | POA: Insufficient documentation

## 2024-05-15 DIAGNOSIS — M25561 Pain in right knee: Secondary | ICD-10-CM | POA: Insufficient documentation

## 2024-05-15 DIAGNOSIS — M545 Low back pain, unspecified: Secondary | ICD-10-CM | POA: Insufficient documentation

## 2024-05-15 DIAGNOSIS — G8929 Other chronic pain: Secondary | ICD-10-CM | POA: Diagnosis present

## 2024-05-15 DIAGNOSIS — M25511 Pain in right shoulder: Secondary | ICD-10-CM | POA: Diagnosis present

## 2024-05-15 DIAGNOSIS — M2550 Pain in unspecified joint: Secondary | ICD-10-CM | POA: Diagnosis present

## 2024-05-15 DIAGNOSIS — M25512 Pain in left shoulder: Secondary | ICD-10-CM | POA: Insufficient documentation

## 2024-05-15 DIAGNOSIS — G5793 Unspecified mononeuropathy of bilateral lower limbs: Secondary | ICD-10-CM | POA: Insufficient documentation

## 2024-05-15 DIAGNOSIS — M7918 Myalgia, other site: Secondary | ICD-10-CM | POA: Insufficient documentation

## 2024-05-15 MED ORDER — GABAPENTIN 300 MG PO CAPS: ORAL_CAPSULE | ORAL | 2 refills | Status: DC

## 2024-05-15 MED ORDER — OXYCODONE HCL 5 MG PO TABS: 5.0000 mg | ORAL_TABLET | Freq: Four times a day (QID) | ORAL | 0 refills | Status: DC | PRN

## 2024-05-20 LAB — TOXASSURE SELECT,+ANTIDEPR,UR

## 2024-07-07 ENCOUNTER — Telehealth: Payer: Self-pay | Admitting: *Deleted

## 2024-07-07 NOTE — Telephone Encounter (Signed)
 Ashley Shepherd called for a refill on her oxycodone  5 mg IR to her [facility].

## 2024-07-08 MED ORDER — OXYCODONE HCL 5 MG PO TABS: 5.0000 mg | ORAL_TABLET | Freq: Four times a day (QID) | ORAL | 0 refills | Status: DC | PRN

## 2024-07-13 ENCOUNTER — Encounter: Admitting: Physical Medicine and Rehabilitation

## 2024-08-05 ENCOUNTER — Encounter: Attending: Registered Nurse | Admitting: Physical Medicine and Rehabilitation

## 2024-08-05 ENCOUNTER — Encounter: Payer: Self-pay | Admitting: Physical Medicine and Rehabilitation

## 2024-08-05 DIAGNOSIS — M545 Low back pain, unspecified: Secondary | ICD-10-CM | POA: Insufficient documentation

## 2024-08-05 DIAGNOSIS — F418 Other specified anxiety disorders: Secondary | ICD-10-CM | POA: Diagnosis not present

## 2024-08-05 DIAGNOSIS — G894 Chronic pain syndrome: Secondary | ICD-10-CM | POA: Diagnosis not present

## 2024-08-05 DIAGNOSIS — M25551 Pain in right hip: Secondary | ICD-10-CM | POA: Insufficient documentation

## 2024-08-05 DIAGNOSIS — G8929 Other chronic pain: Secondary | ICD-10-CM | POA: Insufficient documentation

## 2024-08-05 MED ORDER — NALOXONE HCL 4 MG/0.1ML NA LIQD: 1.0000 | Freq: Once | NASAL | 1 refills | Status: AC

## 2024-08-05 MED ORDER — OXYCODONE HCL 5 MG PO TABS: 5.0000 mg | ORAL_TABLET | Freq: Four times a day (QID) | ORAL | 0 refills | Status: AC | PRN

## 2024-08-05 MED ORDER — OXYCODONE HCL 5 MG PO TABS: 5.0000 mg | ORAL_TABLET | Freq: Four times a day (QID) | ORAL | 0 refills | Status: DC | PRN

## 2024-08-05 NOTE — Progress Notes (Signed)
 An audio/video tele-health visit is felt to be the most appropriate encounter for this patient at this time. This is a follow up tele-visit via phone. The patient is at home. MD is at office. Prior to scheduling this appointment, our staff discussed the limitations of evaluation and management by telemedicine and the availability of in-person appointments. The patient expressed understanding and agreed to proceed.   Pt not able to be in person for visit today couldn't get here on time due to transportation issues   Patient is a 68 yr old female  HIV- undetectable for 9+ years; no other major issues except B/L hip DJD and s/p L THR with infection noted s/p surgery- Had ear infection of L ear and then had 3 surgeries of L hip. Hx of anterior hip replacements on L and R hip replacement.  Had epidural for Hip pain. Also has nerve pain and myofascial pain.     BP 157/81 and pulse 72 Took again 148/82 and pulse 68   Would like to get to see me, but Daughter works M/W/F and cannot get here on time-   Same pain in back and legs- esp the front- the bone on front of leg- shin bones hurts more with rain and cooler weather- having more trouble the last couple of weeks.  Knows it's arthritis.   Counted meds- has 7 pills left.   Exam: On webex  Plan: I suggest call Insurance to see if they can help you get to appointment.   2. Last UDS 05/15/24- was appropriate  3. Will send in pain meds Oxycodone  5 mg Q6 hours prn- #110- - will send in 2 refills- so makes it to Eunice's appt  4. Will send in your Narcan  to be refilled just in case-   5. Con't Gabapentin - 600 mg 2x/day- for nerve pain-   6. F/U in 2 months with Fidela and 4 months with me.   7. We discussed how to get her to appointments.   8. We discussed Alanon-   I spent a total of 23   minutes on total care today- >50% coordination of care- due to  d/w pt about getting to appointments- maybe insurance will help, how will refill meds and  con't gabapentin  and how to manage seeing us .

## 2024-10-05 NOTE — Progress Notes (Unsigned)
 Subjective:    Patient ID: Ashley Shepherd, female    DOB: 1956-10-15, 68 y.o.   MRN: [id number]  HPI: Ashley Shepherd is a 68 y.o. female who returns for follow up appointment for chronic pain and medication refill. She states her pain is located in her bilateral shoulders, lower back, right lower extremity, reports increase intensity and frequency of right lower extremity pain, she refuses X-[place]ull at this time. She will call office if she changes her mind, she verbalizes understanding. She rates her pain 6. Her current exercise regime is walking and performing stretching exercises.  Ashley Shepherd reports last Friday, she was trying to prevent someone from falling in her home , they both fell and she landed on her right side. She was able to pick herself up. She didn't seek medical attention. Educated on [employer], she verbalizes understanding.   Ashley Shepherd Morphine  equivalent is 30.00 MME.   Last UDS was Performed on 05/15/2024, it was consistent.     Pain Inventory Average Pain 7 Pain Right Now 6 My pain is sharp, stabbing, and aching  In the last 24 hours, has pain interfered with the following? General activity 5 Relation with others 3 Enjoyment of life 4 What TIME of day is your pain at its worst? morning  and evening Sleep (in general) Fair  Pain is worse with: walking, bending, inactivity, standing, and some activites Pain improves with: medication Relief from Meds: 7  Family History  Problem Relation Age of Onset   Hypertension Father    Diabetes Father    Heart attack Father    Hypertension Other    Diabetes Other    Social History   Socioeconomic History   Marital status: Widowed    Spouse name: Not on file   Number of children: Not on file   Years of education: Not on file   Highest education level: Not on file  Occupational History   Not on file  Tobacco Use   Smoking status: Every Day    Current packs/day: 0.50    Types: Cigarettes   Smokeless tobacco: Never   Vaping Use   Vaping status: Never Used  Substance and Sexual Activity   Alcohol use: No   Drug use: No   Sexual activity: Not on file  Other Topics Concern   Not on file  Social History Narrative   Not on file   Social Drivers of Health   Financial Resource Strain: Low Risk  (04/08/2024)   Received from [facility]   Overall Financial Resource Strain (CARDIA)    Difficulty of Paying Living Expenses: Not hard at all  Food Insecurity: No Food Insecurity (04/08/2024)   Received from [facility]   Hunger Vital Sign    Within the past 12 months, you worried that your food would run out before you got the money to buy more.: Never true    Within the past 12 months, the food you bought just didn't last and you didn't have money to get more.: Never true  Transportation Needs: Unmet Transportation Needs (04/08/2024)   Received from [facility] - Transportation    Lack of Transportation (Medical): Yes    Lack of Transportation (Non-Medical): No  Physical Activity: Insufficiently Active (04/08/2024)   Received [facility]   Exercise Vital Sign    On average, how many days per week do you engage in moderate to strenuous exercise (like a brisk walk)?: 3 days  On average, how many minutes do you engage in exercise at this level?: 20 min  Stress: Stress Concern Present (04/08/2024)   Received from [facility] of Occupational Health - Occupational Stress Questionnaire    Feeling of Stress : To some extent  Social Connections: Socially Integrated (04/08/2024)   Received from [facility]   Social Network    How would you rate your social network (family, work, friends)?: Good participation with social networks   Past Surgical History:  Procedure Laterality Date   ANTERIOR HIP REVISION Left 04/06/2016   Procedure: REMOVAL OF TEMPORARY ANTIBIOTIC SPACER LEFT HIP, LEFT TOTAL HIP REVISION;  Surgeon: Lonni CINDERELLA Poli, MD;  [facility]: [facility];   Service: Orthopedics;  Laterality: Left;   ANTERIOR HIP REVISION Left 08/02/2017   Procedure: Irrigation and debridement left hip with antibiotic bead placement;  Surgeon: Poli Lonni CINDERELLA, MD;  [facility];  Service: Orthopedics;  Laterality: Left;   APPENDECTOMY     COLON SURGERY  2008   COLOSTOMY REVERSAL  2013   EXCISIONAL TOTAL HIP ARTHROPLASTY WITH ANTIBIOTIC SPACERS Left 01/24/2016   Procedure: EXCISION OF LEFT TOTAL HIP ARTHROPLASTY WITH PLACEMENT OF ANTIBIOTIC SPACERS;  Surgeon: Lonni CINDERELLA Poli, MD;  Location: [facility];  Service: Orthopedics;  Laterality: Left;   INCISION AND DRAINAGE HIP Left 01/24/2016   Procedure: IRRIGATION AND DEBRIDEMENT LEFT HIP;  Surgeon: Lonni CINDERELLA Poli, MD;  Location: [facility] OR;  Service: Orthopedics;  Laterality: Left;   KNEE ARTHROSCOPY     left   PORT-A-CATH REMOVAL     TOTAL HIP ARTHROPLASTY Left 10/01/2014   Procedure: LEFT TOTAL HIP ARTHROPLASTY ANTERIOR APPROACH;  Surgeon: Lonni CINDERELLA Poli, MD;  [facility];  Service: Orthopedics;  Laterality: Left;   TOTAL HIP ARTHROPLASTY Right 07/14/2021   Procedure: RIGHT TOTAL HIP ARTHROPLASTY ANTERIOR APPROACH;  Surgeon: Poli Lonni CINDERELLA, MD;  [facility];  Service: Orthopedics;  Laterality: Right;   Past Surgical History:  Procedure Laterality Date   ANTERIOR HIP REVISION Left 04/06/2016   Procedure: REMOVAL OF TEMPORARY ANTIBIOTIC SPACER LEFT HIP, LEFT TOTAL HIP REVISION;  Surgeon: Lonni CINDERELLA Poli, MD;  [facility];  Service: Orthopedics;  Laterality: Left;   ANTERIOR HIP REVISION Left 08/02/2017   Procedure: Irrigation and debridement left hip with antibiotic bead placement;  Surgeon: Poli Lonni CINDERELLA, MD;  [facility];  Service: Orthopedics;  Laterality: Left;   APPENDECTOMY     COLON SURGERY  2008   COLOSTOMY REVERSAL  2013   EXCISIONAL TOTAL HIP ARTHROPLASTY WITH ANTIBIOTIC SPACERS Left 01/24/2016   Procedure: EXCISION OF LEFT TOTAL HIP  ARTHROPLASTY WITH PLACEMENT OF ANTIBIOTIC SPACERS;  Surgeon: Lonni CINDERELLA Poli, MD;  [facility];  Service: Orthopedics;  Laterality: Left;   INCISION AND DRAINAGE HIP Left 01/24/2016   Procedure: IRRIGATION AND DEBRIDEMENT LEFT HIP;  Surgeon: Lonni CINDERELLA Poli, MD;  [facility] OR;  Service: Orthopedics;  Laterality: Left;   KNEE ARTHROSCOPY     left   PORT-A-CATH REMOVAL     TOTAL HIP ARTHROPLASTY Left 10/01/2014   Procedure: LEFT TOTAL HIP ARTHROPLASTY ANTERIOR APPROACH;  Surgeon: Lonni CINDERELLA Poli, MD;  [facility];  Service: Orthopedics;  Laterality: Left;   TOTAL HIP ARTHROPLASTY Right 07/14/2021   Procedure: RIGHT TOTAL HIP ARTHROPLASTY ANTERIOR APPROACH;  Surgeon: Poli Lonni CINDERELLA, MD;  [facility];  Service: Orthopedics;  Laterality: Right;   Past Medical History:  Diagnosis Date   Anemia    Anxiety  Arthritis    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HCC)    left   Cancer (HCC)    7 years ago, cancer free now   Depression    Difficulty sleeping    GERD (gastroesophageal reflux disease)    History of kidney stones    History of transfusion    HIV disease (HCC)    Kidney stones    Pneumonia    BP (!) 163/85   Pulse 67   Resp 16   Wt 185 lb (83.9 kg)   SpO2 99%   BMI 26.54 kg/m   Opioid Risk Score:   Fall Risk Score:  `1  Depression screen PHQ 2/9     05/15/2024    1:13 PM 12/26/2023    9:53 AM 07/15/2023   11:58 AM 03/11/2023    1:01 PM 10/08/2022   12:05 PM 07/06/2022    3:32 PM 11/10/2021    2:21 PM  Depression screen PHQ 2/9  Decreased Interest 1 1 0 0 0 0 0  Down, Depressed, Hopeless 1 1 0 0 0 0 0  PHQ - 2 Score 2 2 0 0 0 0 0     Review of Systems  Musculoskeletal:  Positive for back pain.       Bone on the right leg, lower back in her shoulders       Objective:   Physical Exam Vitals and nursing note reviewed.  Constitutional:      Appearance: Normal appearance.  Cardiovascular:     Rate and Rhythm: Normal rate and  regular rhythm.     Pulses: Normal pulses.     Heart sounds: Normal heart sounds.  Pulmonary:     Effort: Pulmonary effort is normal.     Breath sounds: Normal breath sounds.  Musculoskeletal:     Comments: Normal Muscle Bulk and Muscle Testing Reveals:  Upper Extremities: Full ROM and Muscle Strength 5/5 Bilateral AC Joint Tenderness  Lumbar Paraspinal Tenderness: L-4-L-5 Right Greater Trochanter Tenderness  Lower Extremities: Decreased ROM and Muscle Strength 5/5Bilateral Lower Extremities Flexion Produces Pain into her Bilateral Lower Extremities  Normal Based Gait     Skin:    General: Skin is warm and dry.  Neurological:     Mental Status: She is alert and oriented to person, place, and time.  Psychiatric:        Mood and Affect: Mood normal.        Behavior: Behavior normal.          Assessment & Plan:  Chronic Bilateral Thoracic Back Pain: Continue HEP as Tolerated. Continue to monitor. 10/06/2024 Chronic Low Back Pain without sciatica: Continue HEP as Tolerated. 10/06/2024 Chronic Bilateral Shoulder Pain: Continue HEP as Tolerated. Continue to Monitor. 10/06/2024 Bilateral Chronic Knee Pain: Continue HEP as Tolerated. Continue to Monitor. 10/06/2024 Myofascial pain: Continue Tizanidine . She has a scheduled appointment with Dr Lovorn for Trigger Point Injection. Continue to Monitor.  4. Polyarthralgia : Continue HEP as Tolerated. Continue to Monitor. 10/06/2024 5, Chronic Pain Syndrome: Refilled: :Oxycodone  5 mg one tablet every 6 hours as needed for pain. #120. We will continue the opioid monitoring program, this consists of regular clinic visits, examinations, urine drug screen, pill counts as well as use of [place]  Controlled Substance Reporting system. A 12 month History has been reviewed on the [place]  Controlled Substance Reporting System on 10/06/2024   6. Bilateral Feet with Neuropathic Pain: Continue Gabapentin . Continue to Monitor. 10/06/2024 7.  Fall at home: subsequent encounter. Educated on  falls prevention, she verbalizes understanding.  F/U with Dr Lovorn

## 2024-10-06 ENCOUNTER — Encounter: Attending: Registered Nurse | Admitting: Registered Nurse

## 2024-10-06 ENCOUNTER — Encounter: Payer: Self-pay | Admitting: Registered Nurse

## 2024-10-06 VITALS — BP 155/90 | HR 67 | Resp 16 | Wt 185.0 lb

## 2024-10-06 DIAGNOSIS — M25561 Pain in right knee: Secondary | ICD-10-CM | POA: Insufficient documentation

## 2024-10-06 DIAGNOSIS — Z79891 Long term (current) use of opiate analgesic: Secondary | ICD-10-CM | POA: Insufficient documentation

## 2024-10-06 DIAGNOSIS — M545 Low back pain, unspecified: Secondary | ICD-10-CM | POA: Diagnosis not present

## 2024-10-06 DIAGNOSIS — M25511 Pain in right shoulder: Secondary | ICD-10-CM | POA: Diagnosis not present

## 2024-10-06 DIAGNOSIS — M7918 Myalgia, other site: Secondary | ICD-10-CM | POA: Insufficient documentation

## 2024-10-06 DIAGNOSIS — Z79899 Other long term (current) drug therapy: Secondary | ICD-10-CM | POA: Insufficient documentation

## 2024-10-06 DIAGNOSIS — G894 Chronic pain syndrome: Secondary | ICD-10-CM

## 2024-10-06 DIAGNOSIS — M546 Pain in thoracic spine: Secondary | ICD-10-CM | POA: Insufficient documentation

## 2024-10-06 DIAGNOSIS — G8929 Other chronic pain: Secondary | ICD-10-CM | POA: Insufficient documentation

## 2024-10-06 DIAGNOSIS — M792 Neuralgia and neuritis, unspecified: Secondary | ICD-10-CM | POA: Insufficient documentation

## 2024-10-06 DIAGNOSIS — W19XXXD Unspecified fall, subsequent encounter: Secondary | ICD-10-CM | POA: Diagnosis not present

## 2024-10-06 DIAGNOSIS — Y92009 Unspecified place in unspecified non-institutional (private) residence as the place of occurrence of the external cause: Secondary | ICD-10-CM | POA: Insufficient documentation

## 2024-10-06 DIAGNOSIS — Z760 Encounter for issue of repeat prescription: Secondary | ICD-10-CM | POA: Diagnosis not present

## 2024-10-06 DIAGNOSIS — M25562 Pain in left knee: Secondary | ICD-10-CM | POA: Diagnosis not present

## 2024-10-06 DIAGNOSIS — M25512 Pain in left shoulder: Secondary | ICD-10-CM | POA: Insufficient documentation

## 2024-10-06 DIAGNOSIS — Z5181 Encounter for therapeutic drug level monitoring: Secondary | ICD-10-CM | POA: Insufficient documentation

## 2024-10-06 MED ORDER — GABAPENTIN 300 MG PO CAPS: ORAL_CAPSULE | ORAL | 3 refills | Status: DC

## 2024-10-06 MED ORDER — OXYCODONE HCL 5 MG PO TABS
5.0000 mg | ORAL_TABLET | Freq: Four times a day (QID) | ORAL | 0 refills | Status: DC | PRN
Start: 2024-10-06 — End: 2024-10-06

## 2024-10-06 MED ORDER — OXYCODONE HCL 5 MG PO TABS: 5.0000 mg | ORAL_TABLET | Freq: Four times a day (QID) | ORAL | 0 refills | Status: DC | PRN

## 2024-11-30 ENCOUNTER — Encounter: Admitting: Physical Medicine and Rehabilitation

## 2024-12-03 DIAGNOSIS — G8929 Other chronic pain: Secondary | ICD-10-CM

## 2024-12-03 DIAGNOSIS — G894 Chronic pain syndrome: Secondary | ICD-10-CM

## 2024-12-03 MED ORDER — OXYCODONE HCL 5 MG PO TABS: 5.0000 mg | ORAL_TABLET | Freq: Four times a day (QID) | ORAL | 0 refills | Status: DC | PRN

## 2024-12-14 ENCOUNTER — Encounter: Admitting: Orthopaedic Surgery

## 2024-12-14 ENCOUNTER — Encounter: Admitting: Registered Nurse

## 2024-12-14 ENCOUNTER — Encounter: Payer: Self-pay | Admitting: Registered Nurse

## 2024-12-14 ENCOUNTER — Encounter: Attending: Registered Nurse | Admitting: Registered Nurse

## 2024-12-14 VITALS — BP 143/93 | HR 94 | Ht 70.0 in | Wt 186.6 lb

## 2024-12-14 DIAGNOSIS — M25561 Pain in right knee: Secondary | ICD-10-CM | POA: Diagnosis present

## 2024-12-14 DIAGNOSIS — Z79891 Long term (current) use of opiate analgesic: Secondary | ICD-10-CM | POA: Insufficient documentation

## 2024-12-14 DIAGNOSIS — M7918 Myalgia, other site: Secondary | ICD-10-CM | POA: Insufficient documentation

## 2024-12-14 DIAGNOSIS — M545 Low back pain, unspecified: Secondary | ICD-10-CM | POA: Diagnosis present

## 2024-12-14 DIAGNOSIS — M25511 Pain in right shoulder: Secondary | ICD-10-CM | POA: Insufficient documentation

## 2024-12-14 DIAGNOSIS — G5793 Unspecified mononeuropathy of bilateral lower limbs: Secondary | ICD-10-CM | POA: Diagnosis present

## 2024-12-14 DIAGNOSIS — M2550 Pain in unspecified joint: Secondary | ICD-10-CM | POA: Diagnosis present

## 2024-12-14 DIAGNOSIS — G894 Chronic pain syndrome: Secondary | ICD-10-CM | POA: Insufficient documentation

## 2024-12-14 DIAGNOSIS — Z5181 Encounter for therapeutic drug level monitoring: Secondary | ICD-10-CM | POA: Diagnosis present

## 2024-12-14 DIAGNOSIS — M25512 Pain in left shoulder: Secondary | ICD-10-CM | POA: Insufficient documentation

## 2024-12-14 DIAGNOSIS — G8929 Other chronic pain: Secondary | ICD-10-CM | POA: Diagnosis present

## 2024-12-14 MED ORDER — OXYCODONE HCL 5 MG PO TABS: 5.0000 mg | ORAL_TABLET | Freq: Four times a day (QID) | ORAL | 0 refills | Status: AC | PRN

## 2024-12-14 MED ORDER — OXYCODONE HCL 5 MG PO TABS: 5.0000 mg | ORAL_TABLET | Freq: Four times a day (QID) | ORAL | 0 refills | Status: DC | PRN

## 2024-12-14 MED ORDER — TIZANIDINE HCL 4 MG PO TABS: ORAL_TABLET | ORAL | 5 refills | Status: AC

## 2024-12-14 NOTE — Progress Notes (Signed)
 "  Subjective:    Patient ID: Ashley Shepherd, female    DOB: Nov 04, 1956, 68 y.o.   MRN: [id number]  HPI: Ashley Shepherd is a 68 y.o. female who returns for follow up appointment for chronic pain and medication refill. She states her pain is located in her bilateral shoulders, lower back pain and generalized joint pain. She also reports right knee pain for the last two weeks, she denies falling. She  refuses X-[place], called Dr Ashley Shepherd office and has a 3:15 appointment today. She rates her pain 9. Her current exercise regime is walking short distances  Ms. Ashley Shepherd Morphine  equivalent is 30.00 MME.   Oral Swab was Performed today.    Pain Inventory Average Pain 8 Pain Right Now 9 My pain is sharp and burning  In the last 24 hours, has pain interfered with the following? General activity 5 Relation with others 7 Enjoyment of life 6 What TIME of day is your pain at its worst? morning , daytime, evening, and night Sleep (in general) Fair  Pain is worse with: walking, inactivity, standing, and some activites Pain improves with: rest and medication Relief from Meds: 6  Family History  Problem Relation Age of Onset   Hypertension Father    Diabetes Father    Heart attack Father    Hypertension Other    Diabetes Other    Social History   Socioeconomic History   Marital status: Widowed    Spouse name: Not on file   Number of children: Not on file   Years of education: Not on file   Highest education level: Not on file  Occupational History   Not on file  Tobacco Use   Smoking status: Every Day    Current packs/day: 0.50    Types: Cigarettes   Smokeless tobacco: Never  Vaping Use   Vaping status: Never Used  Substance and Sexual Activity   Alcohol use: No   Drug use: No   Sexual activity: Not on file  Other Topics Concern   Not on file  Social History Narrative   Not on file   Social Drivers of Health   Tobacco Use: High Risk (12/14/2024)   Patient History    Smoking Tobacco  Use: Every Day    Smokeless Tobacco Use: Never    Passive Exposure: Not on file  Financial Resource Strain: Low Risk (04/08/2024)   Received from [facility]   Overall Financial Resource Strain (CARDIA)    Difficulty of Paying Living Expenses: Not hard at all  Food Insecurity: No Food Insecurity (04/08/2024)   Received from [facility]   Epic    Within the past 12 months, you worried that your food would run out before you got the money to buy more.: Never true    Within the past 12 months, the food you bought just didn't last and you didn't have money to get more.: Never true  Transportation Needs: Unmet Transportation Needs (04/08/2024)   Received from [facility] - Transportation    Lack of Transportation (Medical): Yes    Lack of Transportation (Non-Medical): No  Physical Activity: Insufficiently Active (04/08/2024)   Received from [facility]   Exercise Vital Sign    On average, how many days per week do you engage in moderate to strenuous exercise (like a brisk walk)?: 3 days    On average, how many minutes do you engage in exercise at this level?: 20 min  Stress: Stress Concern Present (  04/08/2024)   Received from [facility] of Occupational Health - Occupational Stress Questionnaire    Feeling of Stress : To some extent  Social Connections: Socially Integrated (04/08/2024)   Received from [facility]   Social Network    How would you rate your social network (family, work, friends)?: Good participation with social networks  Depression (PHQ2-9): Low Risk (05/15/2024)   Depression (PHQ2-9)    PHQ-2 Score: 2  Alcohol Screen: Not on file  Housing: Low Risk (04/08/2024)   Received from [facility]    In the last 12 months, was there a time when you were not able to pay the mortgage or rent on time?: No    In the past 12 months, how many times have you moved where you were living?: 0    At any time in the past 12 months, were you  homeless or living in a shelter (including now)?: No  Utilities: Not At Risk (04/08/2024)   Received from [facility] Utilities    Threatened with loss of utilities: No  Health Literacy: Not on file   Past Surgical History:  Procedure Laterality Date   ANTERIOR HIP REVISION Left 04/06/2016   Procedure: REMOVAL OF TEMPORARY ANTIBIOTIC SPACER LEFT HIP, LEFT TOTAL HIP REVISION;  Surgeon: Ashley CINDERELLA Poli, MD;  [facility];  Service: Orthopedics;  Laterality: Left;   ANTERIOR HIP REVISION Left 08/02/2017   Procedure: Irrigation and debridement left hip with antibiotic bead placement;  Surgeon: Shepherd Ashley CINDERELLA, MD;  [facility];  Service: Orthopedics;  Laterality: Left;   APPENDECTOMY     COLON SURGERY  2008   COLOSTOMY REVERSAL  2013   EXCISIONAL TOTAL HIP ARTHROPLASTY WITH ANTIBIOTIC SPACERS Left 01/24/2016   Procedure: EXCISION OF LEFT TOTAL HIP ARTHROPLASTY WITH PLACEMENT OF ANTIBIOTIC SPACERS;  Surgeon: Ashley CINDERELLA Poli, MD;  [facility];  Service: Orthopedics;  Laterality: Left;   INCISION AND DRAINAGE HIP Left 01/24/2016   Procedure: IRRIGATION AND DEBRIDEMENT LEFT HIP;  Surgeon: Ashley CINDERELLA Poli, MD;  Location: [facility] OR;  Service: Orthopedics;  Laterality: Left;   KNEE ARTHROSCOPY     left   PORT-A-CATH REMOVAL     TOTAL HIP ARTHROPLASTY Left 10/01/2014   Procedure: LEFT TOTAL HIP ARTHROPLASTY ANTERIOR APPROACH;  Surgeon: Ashley CINDERELLA Poli, MD;  [facility];  Service: Orthopedics;  Laterality: Left;   TOTAL HIP ARTHROPLASTY Right 07/14/2021   Procedure: RIGHT TOTAL HIP ARTHROPLASTY ANTERIOR APPROACH;  Surgeon: Shepherd Ashley CINDERELLA, MD;  [facility];  Service: Orthopedics;  Laterality: Right;   Past Surgical History:  Procedure Laterality Date   ANTERIOR HIP REVISION Left 04/06/2016   Procedure: REMOVAL OF TEMPORARY ANTIBIOTIC SPACER LEFT HIP, LEFT TOTAL HIP REVISION;  Surgeon: Ashley CINDERELLA Poli, MD;  [facility];   Service: Orthopedics;  Laterality: Left;   ANTERIOR HIP REVISION Left 08/02/2017   Procedure: Irrigation and debridement left hip with antibiotic bead placement;  Surgeon: Shepherd Ashley CINDERELLA, MD;  [facility];  Service: Orthopedics;  Laterality: Left;   APPENDECTOMY     COLON SURGERY  2008   COLOSTOMY REVERSAL  2013   EXCISIONAL TOTAL HIP ARTHROPLASTY WITH ANTIBIOTIC SPACERS Left 01/24/2016   Procedure: EXCISION OF LEFT TOTAL HIP ARTHROPLASTY WITH PLACEMENT OF ANTIBIOTIC SPACERS;  Surgeon: Ashley CINDERELLA Poli, MD;  [facility];  Service: Orthopedics;  Laterality: Left;   INCISION AND DRAINAGE HIP Left 01/24/2016   Procedure: IRRIGATION AND  DEBRIDEMENT LEFT HIP;  Surgeon: Ashley CINDERELLA Poli, MD;  Location: [facility] OR;  Service: Orthopedics;  Laterality: Left;   KNEE ARTHROSCOPY     left   PORT-A-CATH REMOVAL     TOTAL HIP ARTHROPLASTY Left 10/01/2014   Procedure: LEFT TOTAL HIP ARTHROPLASTY ANTERIOR APPROACH;  Surgeon: Ashley CINDERELLA Poli, MD;  [facility];  Service: Orthopedics;  Laterality: Left;   TOTAL HIP ARTHROPLASTY Right 07/14/2021   Procedure: RIGHT TOTAL HIP ARTHROPLASTY ANTERIOR APPROACH;  Surgeon: Shepherd Ashley CINDERELLA, MD;  [facility];  Service: Orthopedics;  Laterality: Right;   Past Medical History:  Diagnosis Date   Anemia    Anxiety    Arthritis    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HCC)    left   Cancer (HCC)    7 years ago, cancer free now   Depression    Difficulty sleeping    GERD (gastroesophageal reflux disease)    History of kidney stones    History of transfusion    HIV disease (HCC)    Kidney stones    Pneumonia    BP (!) 143/93 (BP Location: Left Arm, Patient Position: Sitting, Cuff Size: Normal)   Pulse 94   Ht 5' 10 (1.778 m)   Wt 186 lb 9.6 oz (84.6 kg)   SpO2 98%   BMI 26.77 kg/m   Opioid Risk Score:   Fall Risk Score:  `1  Depression screen PHQ 2/9     05/15/2024    1:13 PM 12/26/2023    9:53 AM 07/15/2023   11:58 AM  03/11/2023    1:01 PM 10/08/2022   12:05 PM 07/06/2022    3:32 PM 11/10/2021    2:21 PM  Depression screen PHQ 2/9  Decreased Interest 1 1 0 0 0 0 0  Down, Depressed, Hopeless 1 1 0 0 0 0 0  PHQ - 2 Score 2 2 0 0 0 0 0      Review of Systems  Musculoskeletal:  Positive for back pain and joint swelling.       Bilateral shoulder, hand pain, right knee swelling and pain, low back pain  All other systems reviewed and are negative.      Objective:   Physical Exam Vitals and nursing note reviewed.  Constitutional:      Appearance: Normal appearance.  Cardiovascular:     Rate and Rhythm: Normal rate and regular rhythm.  Pulmonary:     Effort: Pulmonary effort is normal.     Breath sounds: Normal breath sounds.  Musculoskeletal:     Comments: Normal Muscle Bulk and Muscle Testing Reveals:  Upper Extremities: Full ROM and Muscle Strength 5/5  Thoracic Paraspinal Tenderness: T-1-T-4  Lumbar Paraspinal Tenderness: L-4-L-5 Lower Extremities: Decreased ROM and Muscle Strength 5/5 Right Lower Extremity Flexion Produces Pain into her Right Patella: + Swelling Left Lower Extremity Flexion Produces Pain into her Left Lower Extremity Arises from Table slowly Antalgic  Gait     Skin:    General: Skin is warm and dry.  Neurological:     Mental Status: She is alert and oriented to person, place, and time.  Psychiatric:        Mood and Affect: Mood normal.        Behavior: Behavior normal.          Assessment & Plan:  Chronic Bilateral Thoracic Back Pain: No complaints today. Continue HEP as Tolerated. Continue to monitor. 12/14/2024 Chronic Low Back Pain without sciatica: Continue HEP as Tolerated. 12/14/2024  Chronic Bilateral Shoulder Pain: Continue HEP as Tolerated. Continue to Monitor. 10/06/2024 Acute Right  Knee Pain: She refuses X-[place], and ED or Urgent Care for evaluation. she called Dr Althia office, she has a scheduled appointment today at 3:15. Continue HEP as Tolerated.  Continue to Monitor. 12/14/2024 Myofascial pain: Continue Tizanidine . She has a scheduled appointment with Dr Lovorn for Trigger Point Injection. Continue to Monitor. 12/14/2024 4. Polyarthralgia : Continue HEP as Tolerated. Continue to Monitor. 12/14/2024 5, Chronic Pain Syndrome: Refilled: :Oxycodone  5 mg one tablet every 6 hours as needed for pain. #120. Second script sent for the following month. We will continue the opioid monitoring program, this consists of regular clinic visits, examinations, urine drug screen, pill counts as well as use of [place]  Controlled Substance Reporting system. A 12 month History has been reviewed on the [place]  Controlled Substance Reporting System on 12/14/2024   6. Bilateral Feet with Neuropathic Pain: Continue Gabapentin . Continue to Monitor. 12/14/2024   F/U with Dr Cornelio     "

## 2024-12-15 ENCOUNTER — Other Ambulatory Visit: Payer: Self-pay | Admitting: Physical Medicine and Rehabilitation

## 2024-12-15 DIAGNOSIS — M7918 Myalgia, other site: Secondary | ICD-10-CM

## 2024-12-19 LAB — DRUG TOX MONITOR 1 W/CONF, ORAL FLD
Amphetamines: NEGATIVE ng/mL
Barbiturates: NEGATIVE ng/mL
Benzodiazepines: NEGATIVE ng/mL
Buprenorphine: NEGATIVE ng/mL
Cocaine: NEGATIVE ng/mL
Codeine: NEGATIVE ng/mL
Cotinine: >250 ng/mL — ABNORMAL HIGH
Dihydrocodeine: NEGATIVE ng/mL
Fentanyl: NEGATIVE ng/mL
Heroin Metabolite: NEGATIVE ng/mL
Hydrocodone: NEGATIVE ng/mL
Hydromorphone: NEGATIVE ng/mL
MARIJUANA: NEGATIVE ng/mL
MDMA: NEGATIVE ng/mL
Meprobamate: NEGATIVE ng/mL
Methadone: NEGATIVE ng/mL
Morphine: NEGATIVE ng/mL
Nicotine Metabolite: POSITIVE ng/mL — AB
Norhydrocodone: NEGATIVE ng/mL
Noroxycodone: 73.2 ng/mL — ABNORMAL HIGH
Opiates: POSITIVE ng/mL — AB
Oxycodone: >250 ng/mL — ABNORMAL HIGH
Oxymorphone: 3.7 ng/mL — ABNORMAL HIGH
Phencyclidine: NEGATIVE ng/mL
Tapentadol: NEGATIVE ng/mL
Tramadol: NEGATIVE ng/mL
Zolpidem: NEGATIVE ng/mL

## 2024-12-19 LAB — DRUG TOX ALC METAB W/CON, ORAL FLD: Alcohol Metabolite: NEGATIVE ng/mL

## 2025-01-25 ENCOUNTER — Other Ambulatory Visit: Payer: Self-pay | Admitting: Registered Nurse

## 2025-01-26 MED ORDER — GABAPENTIN 300 MG PO CAPS: ORAL_CAPSULE | ORAL | 3 refills | Status: AC

## 2025-02-18 ENCOUNTER — Encounter: Admitting: Registered Nurse

## 2025-04-21 ENCOUNTER — Encounter: Admitting: Physical Medicine and Rehabilitation
